# Patient Record
Sex: Female | Born: 1935 | Race: White | Hispanic: No | Smoking: Never smoker
Health system: Southern US, Community
[De-identification: ages and names within clinical notes are randomized; demographics above are authoritative.]

## PROBLEM LIST (undated history)

## (undated) DIAGNOSIS — I10 Essential (primary) hypertension: Secondary | ICD-10-CM

## (undated) DIAGNOSIS — F419 Anxiety disorder, unspecified: Secondary | ICD-10-CM

## (undated) DIAGNOSIS — B019 Varicella without complication: Secondary | ICD-10-CM

## (undated) DIAGNOSIS — R011 Cardiac murmur, unspecified: Secondary | ICD-10-CM

## (undated) DIAGNOSIS — E785 Hyperlipidemia, unspecified: Secondary | ICD-10-CM

## (undated) DIAGNOSIS — M1990 Unspecified osteoarthritis, unspecified site: Secondary | ICD-10-CM

## (undated) DIAGNOSIS — U071 COVID-19: Secondary | ICD-10-CM

## (undated) HISTORY — DX: Cardiac murmur, unspecified: R01.1

## (undated) HISTORY — DX: COVID-19: U07.1

## (undated) HISTORY — DX: Varicella without complication: B01.9

## (undated) HISTORY — DX: Hyperlipidemia, unspecified: E78.5

## (undated) HISTORY — DX: Unspecified osteoarthritis, unspecified site: M19.90

## (undated) HISTORY — DX: Anxiety disorder, unspecified: F41.9

## (undated) HISTORY — PX: FRACTURE SURGERY: SHX138

---

## 1981-02-25 HISTORY — PX: ABDOMINAL HYSTERECTOMY: SHX81

## 2016-03-18 ENCOUNTER — Ambulatory Visit (INDEPENDENT_AMBULATORY_CARE_PROVIDER_SITE_OTHER): Payer: Medicare Other | Admitting: Family Medicine

## 2016-03-18 ENCOUNTER — Encounter: Payer: Self-pay | Admitting: Family Medicine

## 2016-03-18 VITALS — BP 138/80 | HR 92 | Resp 12 | Ht 62.0 in | Wt 190.2 lb

## 2016-03-18 DIAGNOSIS — H6123 Impacted cerumen, bilateral: Secondary | ICD-10-CM

## 2016-03-18 DIAGNOSIS — R42 Dizziness and giddiness: Secondary | ICD-10-CM

## 2016-03-18 DIAGNOSIS — H9193 Unspecified hearing loss, bilateral: Secondary | ICD-10-CM | POA: Diagnosis not present

## 2016-03-18 DIAGNOSIS — Z23 Encounter for immunization: Secondary | ICD-10-CM | POA: Diagnosis not present

## 2016-03-18 MED ORDER — PREDNISONE 20 MG PO TABS: ORAL_TABLET | ORAL | 0 refills | Status: AC

## 2016-03-18 NOTE — Patient Instructions (Signed)
A few things to remember from today's visit:   Acute hearing loss, bilateral  Bilateral impacted cerumen  Dizziness and giddiness - Plan: CBC, Basic metabolic panel   Please be sure medication list is accurate. If a new problem present, please set up appointment sooner than planned today.

## 2016-03-18 NOTE — Progress Notes (Signed)
Pre visit review using our clinic review tool, if applicable. No additional management support is needed unless otherwise documented below in the visit note. 

## 2016-03-18 NOTE — Progress Notes (Signed)
HPI:   [name] is a 83 y.o. female, who is here today with her daughter's mother In low to establish care with me.  Former PCP: Has not had one since 2011. Last preventive routine visit: Several years ago.   Concerns today: hearing loss.  According to pt, this is a new problem that she noted a few days ago (1-2 weeks) and getting worse. No recent travel but around christmas she did fly.  Hearing loss is constant.  She fever,chills, headache, earache,or tinnitus.  No ear injuries. + Nasal congestion and rhinorrhea. + Intermittent dizziness, unbalance sensation, she is not sure about spinning sensation. It is exacerbated by movement, she did not note it when she was in bed. Alleviated by being still. She denies falls. Took Tylenol  Cold OTC.  No recently URI.  Denies severe/frequent headache, visual changes, chest pain, dyspnea, palpitation, claudication, focal weakness, or edema.   Review of Systems  Constitutional: Negative for activity change, appetite change, diaphoresis, fever and unexpected weight change.  HENT: Positive for congestion, hearing loss and rhinorrhea. Negative for ear discharge, ear pain, mouth sores, nosebleeds, sinus pain, sore throat, tinnitus, trouble swallowing and voice change.   Eyes: Negative for photophobia, pain and visual disturbance.  Respiratory: Negative for cough, shortness of breath and wheezing.   Cardiovascular: Negative for chest pain, palpitations and leg swelling.  Gastrointestinal: Negative for abdominal pain, nausea and vomiting.       No changes in bowel habits.  Endocrine: Negative for cold intolerance, heat intolerance, polydipsia, polyphagia and polyuria.  Genitourinary: Negative for decreased urine volume, dysuria and hematuria.  Musculoskeletal: Negative for gait problem and myalgias.  Skin: Negative for color change and rash.  Neurological: Positive for dizziness. Negative for tremors, syncope,  speech difficulty, weakness, numbness and headaches.  Hematological: Negative for adenopathy. Does not bruise/bleed easily.  Psychiatric/Behavioral: Negative for confusion and sleep disturbance. The patient is nervous/anxious.       No current outpatient prescriptions on file prior to visit.   No current facility-administered medications on file prior to visit.      Past Medical History:  Diagnosis Date  . Arthritis   . Chicken pox    Not on File  Family History  Problem Relation Age of Onset  . Diabetes Neg Hx     Social History   Social History  . Marital status: Widowed    Spouse name: N/A  . Number of children: N/A  . Years of education: N/A   Social History Main Topics  . Smoking status: Never Smoker  . Smokeless tobacco: Never Used  . Alcohol use Yes  . Drug use: No  . Sexual activity: Not Asked   Other Topics Concern  . None   Social History Narrative  . None    Vitals:   03/18/16 1512  BP: 138/80  Pulse: 92  Resp: 12   O2 sat at RA 95%  Body mass index is 34.8 kg/m.   Physical Exam  Constitutional: She is oriented to person, place, and time. She appears well-developed. She does not appear ill. No distress.  HENT:  Head: Atraumatic.  Mouth/Throat: Oropharynx is clear and moist and mucous membranes are normal.  Apley maneuver:Negative Cerumen excess bilateral.   Eyes: Conjunctivae are normal. Pupils are equal, round, and reactive to light.  Neck: No JVD present. Carotid bruit is not present. No tracheal deviation present. No thyromegaly present.  Cardiovascular: Normal rate and regular rhythm.   No  murmur heard. Pulses:      Dorsalis pedis pulses are 2+ on the right side, and 2+ on the left side.  Respiratory: Effort normal and breath sounds normal. No respiratory distress.  GI: Soft. She exhibits no mass. There is no hepatomegaly. There is no tenderness.  Musculoskeletal: She exhibits no edema or tenderness.  Lymphadenopathy:    She  has no cervical adenopathy.  Neurological: She is alert and oriented to person, place, and time. She has normal strength. No cranial nerve deficit or sensory deficit. Coordination normal.  Reflex Scores:      Patellar reflexes are 2+ on the right side and 2+ on the left side. Pronator drift negative. Stable gait, not assisted,needs assistance to get on exam table.  Skin: Skin is warm. No rash noted. No cyanosis or erythema.  Psychiatric: Her speech is normal. Her mood appears anxious. Cognition and memory are normal.  Well groomed, good eye contact.      ASSESSMENT AND PLAN:     Symphoni was seen today for establish care.  Diagnoses and all orders for this visit:  Lab Results  Component Value Date   CREATININE 0.67 03/18/2016   BUN 10 03/18/2016   NA 138 03/18/2016   K 4.0 03/18/2016   CL 104 03/18/2016   CO2 25 03/18/2016   Lab Results  Component Value Date   WBC 9.4 03/18/2016   HGB 14.2 03/18/2016   HCT 42.0 03/18/2016   MCV 88.9 03/18/2016   PLT 226.0 03/18/2016    Acute hearing loss, bilateral  We discussed possible causes: barotrauma,eustachian tube dysfunction, labyrinthitis, cerumen excess among some. Did not resolved or greatly improve after ear lavage. She agrees with starting Prednisone, some side effects discussed. Clearly instructed about warning gins. ENT referral placed.   -     Ambulatory referral to ENT -     predniSONE (DELTASONE) 20 MG tablet; 3 tabs for 3 days, 2 tabs for 3 days, 1 tabs for 3 days, and 1/2 tab for 3 days. Take tables together with breakfast.  Bilateral impacted cerumen  Bilateral. Ear lavage of left ear was successful but did not help with hearing loss. Right ear still some cerumen excess, TM not appreciated. Still hearing loss L>R  Dizziness and giddiness  ? Vertigo. Today Apley maneuver negative Other possible causes discussed. Lab work was done today, further recommendations will be given according to  results. Fall precautions discussed. For now will hold on Meclizine.  -     CBC -     Basic metabolic panel -     Ambulatory referral to ENT  Need for prophylactic vaccination and inoculation against influenza -     Flu vaccine HIGH DOSE PF    Linda G. [name], MD  [facility]. [place] office.

## 2016-03-19 ENCOUNTER — Encounter: Payer: Self-pay | Admitting: Family Medicine

## 2016-03-19 LAB — BASIC METABOLIC PANEL
BUN: 10 mg/dL (ref 6–23)
CHLORIDE: 104 meq/L (ref 96–112)
CO2: 25 mEq/L (ref 19–32)
Calcium: 10.5 mg/dL (ref 8.4–10.5)
Creatinine, Ser: 0.67 mg/dL (ref 0.40–1.20)
GFR: 89.87 mL/min (ref >60.00)
Glucose, Bld: 120 mg/dL — ABNORMAL HIGH (ref 70–99)
POTASSIUM: 4 meq/L (ref 3.5–5.1)
Sodium: 138 mEq/L (ref 135–145)

## 2016-03-19 LAB — CBC
HCT: 42 % (ref 36.0–46.0)
Hemoglobin: 14.2 g/dL (ref 12.0–15.0)
MCHC: 33.7 g/dL (ref 30.0–36.0)
MCV: 88.9 fl (ref 78.0–100.0)
PLATELETS: 226 10*3/uL (ref 150.0–400.0)
RBC: 4.73 Mil/uL (ref 3.87–5.11)
RDW: 15.7 % — AB (ref 11.5–15.5)
WBC: 9.4 10*3/uL (ref 4.0–10.5)

## 2016-03-24 ENCOUNTER — Encounter: Payer: Self-pay | Admitting: Family Medicine

## 2016-07-01 ENCOUNTER — Telehealth: Payer: Self-pay | Admitting: Family Medicine

## 2016-07-09 ENCOUNTER — Other Ambulatory Visit: Payer: Medicare Other

## 2016-07-09 NOTE — Telephone Encounter (Signed)
Called to see if pt wanted to schedule awv - home number was disconnected - no answer / no voicemail on cell.

## 2016-07-16 ENCOUNTER — Encounter: Payer: Medicare Other | Admitting: Family Medicine

## 2016-08-21 ENCOUNTER — Ambulatory Visit (INDEPENDENT_AMBULATORY_CARE_PROVIDER_SITE_OTHER): Payer: Medicare Other | Admitting: Family Medicine

## 2016-08-21 ENCOUNTER — Encounter: Payer: Self-pay | Admitting: Family Medicine

## 2016-08-21 VITALS — BP 138/80 | HR 76 | Resp 12 | Ht 62.0 in | Wt 188.2 lb

## 2016-08-21 DIAGNOSIS — E669 Obesity, unspecified: Secondary | ICD-10-CM | POA: Insufficient documentation

## 2016-08-21 DIAGNOSIS — E66811 Obesity, class 1: Secondary | ICD-10-CM | POA: Insufficient documentation

## 2016-08-21 DIAGNOSIS — Z0000 Encounter for general adult medical examination without abnormal findings: Secondary | ICD-10-CM

## 2016-08-21 DIAGNOSIS — E785 Hyperlipidemia, unspecified: Secondary | ICD-10-CM | POA: Insufficient documentation

## 2016-08-21 DIAGNOSIS — E782 Mixed hyperlipidemia: Secondary | ICD-10-CM | POA: Diagnosis not present

## 2016-08-21 DIAGNOSIS — Z131 Encounter for screening for diabetes mellitus: Secondary | ICD-10-CM

## 2016-08-21 DIAGNOSIS — F5105 Insomnia due to other mental disorder: Secondary | ICD-10-CM | POA: Diagnosis not present

## 2016-08-21 DIAGNOSIS — Z6834 Body mass index (BMI) 34.0-34.9, adult: Secondary | ICD-10-CM | POA: Diagnosis not present

## 2016-08-21 DIAGNOSIS — F419 Anxiety disorder, unspecified: Secondary | ICD-10-CM | POA: Diagnosis not present

## 2016-08-21 DIAGNOSIS — G4700 Insomnia, unspecified: Secondary | ICD-10-CM | POA: Insufficient documentation

## 2016-08-21 LAB — BASIC METABOLIC PANEL
BUN: 12 mg/dL (ref 6–23)
CHLORIDE: 104 meq/L (ref 96–112)
CO2: 27 meq/L (ref 19–32)
Calcium: 9.8 mg/dL (ref 8.4–10.5)
Creatinine, Ser: 0.56 mg/dL (ref 0.40–1.20)
GFR: 110.42 mL/min (ref >60.00)
GLUCOSE: 106 mg/dL — AB (ref 70–99)
POTASSIUM: 3.7 meq/L (ref 3.5–5.1)
Sodium: 139 mEq/L (ref 135–145)

## 2016-08-21 LAB — LIPID PANEL
CHOLESTEROL: 231 mg/dL — AB (ref 0–200)
HDL: 53.5 mg/dL (ref >39.00)
NonHDL: 177.04
Total CHOL/HDL Ratio: 4
Triglycerides: 364 mg/dL — ABNORMAL HIGH (ref 0.0–149.0)
VLDL: 72.8 mg/dL — ABNORMAL HIGH (ref 0.0–40.0)

## 2016-08-21 LAB — LDL CHOLESTEROL, DIRECT: Direct LDL: 112 mg/dL

## 2016-08-21 LAB — TSH: TSH: 1.82 u[IU]/mL (ref 0.35–4.50)

## 2016-08-21 MED ORDER — LORAZEPAM 0.5 MG PO TABS: 0.2500 mg | ORAL_TABLET | Freq: Every evening | ORAL | 0 refills | Status: DC | PRN

## 2016-08-21 NOTE — Patient Instructions (Addendum)
A few things to remember from today's visit:   Diabetes mellitus screening - Plan: Basic metabolic panel  Insomnia secondary to anxiety - Plan: TSH, LORazepam (ATIVAN) 0.5 MG tablet  Class 1 obesity without serious comorbidity with body mass index (BMI) of 34.0 to 34.9 in adult, unspecified obesity type - Plan: Lipid panel   A few tips:  -As we age balance is not as good as it was, so there is a higher risks for falls. Please remove small rugs and furniture that is "in your way" and could increase the risk of falls. Stretching exercises may help with fall prevention: Yoga and Tai Chi are some examples. Low impact exercise is better, so you are not very achy the next day.  -Sun screen and avoidance of direct sun light recommended. Caution with dehydration, if working outdoors be sure to drink enough fluids.  - Some medications are not safe as we age, increases the risk of side effects and can potentially interact with other medication you are also taken;  including some of over the counter medications. Be sure to let me know when you start a new medication even if it is a dietary/vitamin supplement.   -Healthy diet low in red meet/animal fat and sugar + regular physical activity is recommended.      Please be sure medication list is accurate. If a new problem present, please set up appointment sooner than planned today.

## 2016-08-21 NOTE — Progress Notes (Signed)
HPI:   [name] is a 83 y.o. female, who is here today with her daughter for her routine physical. She is also due for subsequent Medicare preventive visit.   Regular exercise 3 or more time per week: Not consistent. She is active with chores at home. Following a healthy diet: Yes. She lives alone but family is close.   Chronic medical problems: Hearing loss (left ear), anxiety disorder, balance "problems", OA, and obesity among some.   Mammogram: 5-6 years ago. Colonoscopy: a few years ago. DEXA: Does not recall, not interested.  Last eye exam "a while ago", she wears reading glasses.She does not feel like it is necessary.   Providers she sees regularly:   Dr Linda Glass, ENT, last OV it was recommended to follow as needed.  Functional Status Survey: Is the patient deaf or have difficulty hearing?: Yes (Left) Does the patient have difficulty seeing, even when wearing glasses/contacts?: No Does the patient have difficulty concentrating, remembering, or making decisions?: No Does the patient have difficulty walking or climbing stairs?: Yes Does the patient have difficulty dressing or bathing?: No Does the patient have difficulty doing errands alone such as visiting a doctor's office or shopping?: No  She does not drive. She has service arrangements and relatives also help with transportation if needed. She also has a private aid that helps with cleaning upstairs. She avoids going up or down stairs because knee and hip OA.   Depression screen [facility] 2/9 08/21/2016  Decreased Interest 0  Down, Depressed, Hopeless 0  PHQ - 2 Score 0       Mini-Cog - 08/21/16 0944    Normal clock drawing test? no   How many words correct? 3     Fall Risk  08/21/2016  Falls in the past year? No  Risk for fall due to : Impaired balance/gait    Hearing Screening Comments: Left hearing loss. Right hearing grossly intact. Vision Screening Comments: Not  interested.  Concerns:  Anxiety: She and her daughter are requesting refill on Lorazepam. She has Hx of anxiety and until a year or so she was taking low dose of  She started taking it after her husband's death, a few years ago. She remembers this medication helping her relax at night and sleep better.  She denies depressed mood or suicidal ideation.  Her daughter will stay with her this weekend, so she can monitor medication tolerance.  HLD:  She is on non pharmacologic treatment.   Review of Systems  Constitutional: Negative for activity change, appetite change, fatigue and fever.  HENT: Positive for hearing loss. Negative for ear pain, mouth sores, nosebleeds, sore throat and trouble swallowing.   Eyes: Negative for redness and visual disturbance.  Respiratory: Negative for cough, shortness of breath and wheezing.   Cardiovascular: Negative for chest pain, palpitations and leg swelling.  Gastrointestinal: Negative for abdominal pain, nausea and vomiting.       Negative for changes in bowel habits.  Endocrine: Negative for cold intolerance, heat intolerance, polydipsia, polyphagia and polyuria.  Genitourinary: Negative for decreased urine volume, dysuria and hematuria.  Musculoskeletal: Positive for arthralgias and gait problem.  Skin: Negative for rash.  Neurological: Negative for syncope, weakness and headaches.  Hematological: Negative for adenopathy.  Psychiatric/Behavioral: Positive for sleep disturbance. Negative for confusion, hallucinations and suicidal ideas. The patient is nervous/anxious.   All other systems reviewed and are negative.    No current outpatient prescriptions on file prior to visit.  No current facility-administered medications on file prior to visit.     Past Medical History:  Diagnosis Date  . Anxiety   . Arthritis   . Chicken pox   . Heart murmur   . Hyperlipidemia     Not on File  Family History  Problem Relation Age of Onset  .  Diabetes Neg Hx     Social History   Social History  . Marital status: Widowed    Spouse name: N/A  . Number of children: N/A  . Years of education: N/A   Social History Main Topics  . Smoking status: Never Smoker  . Smokeless tobacco: Never Used  . Alcohol use Yes     Comment: Occasional  . Drug use: No  . Sexual activity: Not Currently   Other Topics Concern  . None   Social History Narrative  . None     Vitals:   08/21/16 0901  BP: 138/80  Pulse: 76  Resp: 12   Body mass index is 34.43 kg/m.  Wt Readings from Last 3 Encounters:  08/21/16 188 lb 4 oz (85.4 kg)  03/18/16 190 lb 4 oz (86.3 kg)    Physical Exam  Nursing note and vitals reviewed. Constitutional: She is oriented to person, place, and time. She appears well-developed. No distress.  HENT:  Head: Atraumatic.  Mouth/Throat: Oropharynx is clear and moist and mucous membranes are normal.  Eyes: Conjunctivae and EOM are normal. Pupils are equal, round, and reactive to light.  Cardiovascular: Normal rate and regular rhythm.   Murmur (SEM I/VI RUSB) heard. Pulses:      Dorsalis pedis pulses are 2+ on the right side, and 2+ on the left side.  Respiratory: Effort normal and breath sounds normal. No respiratory distress.  GI: Soft. She exhibits no mass. There is no hepatomegaly. There is no tenderness.  Musculoskeletal: She exhibits no edema or tenderness.  Lymphadenopathy:    She has no cervical adenopathy.  Neurological: She is alert and oriented to person, place, and time. She has normal strength. No cranial nerve deficit (except left hear loss). Coordination normal.  Cane assisting with gait, go and get up test > 30 seconds. Oriented x 3. Clock test abnormal (numbers in correct arrangement but time was not correct)  Skin: Skin is warm. No rash noted. No erythema.  Psychiatric: Her mood appears anxious. Her affect is labile (when talking about her husband's death.). Cognition and memory are normal.   Well groomed, good eye contact.    ASSESSMENT AND PLAN:   [name] was seen today for annual exam.  Diagnoses and all orders for this visit:  Lab Results  Component Value Date   CHOL 231 (H) 08/21/2016   HDL 53.50 08/21/2016   LDLDIRECT 112.0 08/21/2016   TRIG 364.0 (H) 08/21/2016   CHOLHDL 4 08/21/2016   Lab Results  Component Value Date   CREATININE 0.56 08/21/2016   BUN 12 08/21/2016   NA 139 08/21/2016   K 3.7 08/21/2016   CL 104 08/21/2016   CO2 27 08/21/2016   Lab Results  Component Value Date   TSH 1.82 08/21/2016    Routine general medical examination at a health care facility   We discussed the importance of regular physical activity and healthy diet for prevention of chronic illness and/or complications. Preventive guidelines reviewed. Vaccination: Refused Prevnar 13. Fall prevention. She is not interested in DEXA, mammogram, or further screening. Daughter does support decision, she not think they are necessary, she think  Ms Mandt is doing fine. Ca++ and vit D supplementation recommended. Advance directives: She will bring copy of POA and living will. She has good family support. Next CPE in 1 year.   -     Basic metabolic panel  Insomnia secondary to anxiety  After discussion of side effects that include MS changes and falls, she and her daughter would like to try Lorazepam. Recommend taking 1/2 tab. Fall precautions. Daughter is staying with until next week. F/U in 3 months.  -     TSH -     LORazepam (ATIVAN) 0.5 MG tablet; Take 0.5 tablets (0.25 mg total) by mouth at bedtime as needed for anxiety.  Class 1 obesity without serious comorbidity with body mass index (BMI) of 34.0 to 34.9 in adult, unspecified obesity type  We discussed benefits of wt loss as well as adverse effects of obesity. Continue a healthy diet and if possible low impact exercise. Silver sneakers is an option, daughter does not feel like this is feasible due to  transportation issues. Weight Watchers is a good option as well as daily brisk walking for 15-30 min as tolerated.  -     Lipid panel  Diabetes mellitus screening -     Basic metabolic panel  Hyperlipidemia, mixed  Low fat diet to continue. Further recommendations will be given according to labs results.    -     Lipid panel -     LDL cholesterol, direct    Return in about 3 months (around 11/21/2016) for sleep and anxity.      [name]. [name], MD  [facility]. [place] office.

## 2016-08-24 ENCOUNTER — Encounter: Payer: Self-pay | Admitting: Family Medicine

## 2016-08-28 NOTE — Addendum Note (Signed)
Addended by: [name], [name] on: 08/28/2016 11:55 PM   Modules accepted: Level of Service

## 2016-09-01 ENCOUNTER — Encounter: Payer: Self-pay | Admitting: Family Medicine

## 2016-11-14 ENCOUNTER — Encounter: Payer: Self-pay | Admitting: Family Medicine

## 2017-01-05 NOTE — Progress Notes (Signed)
HPI:   Ms.Shalaunda Rosy Estabrook is a 83 y.o. female, who is here today with her daughter to follow on some chronic medical problems.  Last seen on 08/21/16 for her CPE.  Insomnia and anxiety, last OV Xanax 0.25 mg daily was started. Xanax was requested by patient and her daughter to help with sleep and anxiety. She had taken it in the past and did help. She denies side effects. She ran out of medication a few days ago. While she was taking Xanax she was able to fall asleep faster and stayed asleep through the night, 6 hours.  She denies suicidal thoughts or depressed mood.  Hyperlipidemia:  Last OV Simvastatin recommended but medication was not called into the pharmacy.  She is willing to try pharmacologic treatment. Following a low fat diet: Yes. She has improved her diet, has noted some wt loss.    Lab Results  Component Value Date   CHOL 231 (H) 08/21/2016   HDL 53.50 08/21/2016   LDLDIRECT 112.0 08/21/2016   TRIG 364.0 (H) 08/21/2016   CHOLHDL 4 08/21/2016    Noted sinus tachycardia, according to the patient she "always" has elevated heart rate.  According to the daughter, heart rate is usually within normal range at home.  She attributes tachycardia to anxiety. No new concerns today.  No headache, visual changes, chest pain, dyspnea, palpitation, focal weakness, or edema.  Review of Systems  Constitutional: Negative for activity change, appetite change and fatigue.  HENT: Negative for mouth sores and sore throat.   Respiratory: Negative for cough, shortness of breath and wheezing.   Cardiovascular: Negative for chest pain, palpitations and leg swelling.  Gastrointestinal: Negative for abdominal pain, nausea and vomiting.  Endocrine: Negative for cold intolerance and heat intolerance.  Musculoskeletal: Positive for gait problem. Negative for myalgias.  Neurological: Negative for dizziness, syncope, weakness and headaches.  Psychiatric/Behavioral:  Positive for sleep disturbance. Negative for confusion, hallucinations and suicidal ideas. The patient is nervous/anxious.       No current outpatient medications on file prior to visit.   No current facility-administered medications on file prior to visit.      Past Medical History:  Diagnosis Date  . Anxiety   . Arthritis   . Chicken pox   . Heart murmur   . Hyperlipidemia    Not on File  Social History   Socioeconomic History  . Marital status: Widowed    Spouse name: None  . Number of children: None  . Years of education: None  . Highest education level: None  Social Needs  . Financial resource strain: None  . Food insecurity - worry: None  . Food insecurity - inability: None  . Transportation needs - medical: None  . Transportation needs - non-medical: None  Occupational History  . None  Tobacco Use  . Smoking status: Never Smoker  . Smokeless tobacco: Never Used  Substance and Sexual Activity  . Alcohol use: Yes    Comment: Occasional  . Drug use: No  . Sexual activity: Not Currently  Other Topics Concern  . None  Social History Narrative  . None    Vitals:   01/06/17 1044  BP: 130/72  Pulse: (!) 107  Resp: 16  Temp: 97.6 F (36.4 C)  SpO2: 97%   Body mass index is 33.15 kg/m.   Physical Exam  Nursing note and vitals reviewed. Constitutional: She is oriented to person, place, and time. She appears well-developed. No distress.  HENT:  Head: Normocephalic and atraumatic.  Mouth/Throat: Oropharynx is clear and moist and mucous membranes are normal.  Eyes: Conjunctivae are normal. Pupils are equal, round, and reactive to light.  Cardiovascular: Regular rhythm. Tachycardia present.  Murmur (soft SEM RUSB) heard. Pulses:      Dorsalis pedis pulses are 2+ on the right side, and 2+ on the left side.  Respiratory: Effort normal and breath sounds normal. No respiratory distress.  GI: Soft. She exhibits no mass. There is no tenderness.    Musculoskeletal: She exhibits edema (Trace pitting LE edema,bilateral.). She exhibits no tenderness.  Lymphadenopathy:    She has no cervical adenopathy.  Neurological: She is alert and oriented to person, place, and time. She has normal strength. Coordination normal.  Stable gait assisted by cane.  Skin: Skin is warm. No erythema.  Psychiatric: Her mood appears anxious.  Well groomed, good eye contact.    ASSESSMENT AND PLAN:   Ms. Rheda was seen today for follow-up.  Diagnoses and all orders for this visit:  Insomnia secondary to anxiety  Well controlled with Xanax. Side effects of Xanax discussed. Good sleep hygiene. No changes in current management.  -     LORazepam (ATIVAN) 0.5 MG tablet; Take 0.5 tablets (0.25 mg total) at bedtime as needed by mouth for anxiety.  Hyperlipidemia, mixed  Low fat diet to continue. Zocor 20 mg to start, some side effects discussed. F/U in 3-4 months.  -     simvastatin (ZOCOR) 20 MG tablet; Take 1 tablet (20 mg total) at bedtime by mouth.  Sinus tachycardia  Mild and asymptomatic. Possible causes discussed,including anxiety among others. Instructed to continue monitoring. Adequate hydration.  Need for influenza vaccination -     Flu vaccine HIGH DOSE PF    -Ms. Brandyn Jonel Sick was advised to return sooner than planned today if new concerns arise.       Daundre Biel G. [name], MD  [facility]. [place] office.

## 2017-01-06 ENCOUNTER — Encounter: Payer: Self-pay | Admitting: Family Medicine

## 2017-01-06 ENCOUNTER — Ambulatory Visit: Payer: Medicare Other | Admitting: Family Medicine

## 2017-01-06 VITALS — BP 130/72 | HR 107 | Temp 97.6°F | Resp 16 | Ht 62.0 in | Wt 181.2 lb

## 2017-01-06 DIAGNOSIS — F5105 Insomnia due to other mental disorder: Secondary | ICD-10-CM

## 2017-01-06 DIAGNOSIS — E782 Mixed hyperlipidemia: Secondary | ICD-10-CM

## 2017-01-06 DIAGNOSIS — R000 Tachycardia, unspecified: Secondary | ICD-10-CM

## 2017-01-06 DIAGNOSIS — F419 Anxiety disorder, unspecified: Secondary | ICD-10-CM | POA: Diagnosis not present

## 2017-01-06 DIAGNOSIS — Z23 Encounter for immunization: Secondary | ICD-10-CM

## 2017-01-06 MED ORDER — SIMVASTATIN 20 MG PO TABS: 20.0000 mg | ORAL_TABLET | Freq: Every day | ORAL | 1 refills | Status: DC

## 2017-01-06 MED ORDER — LORAZEPAM 0.5 MG PO TABS: 0.2500 mg | ORAL_TABLET | Freq: Every evening | ORAL | 2 refills | Status: DC | PRN

## 2017-01-06 NOTE — Patient Instructions (Signed)
A few things to remember from today's visit:   Insomnia secondary to anxiety - Plan: LORazepam (ATIVAN) 0.5 MG tablet  Hyperlipidemia, mixed - Plan: simvastatin (ZOCOR) 20 MG tablet  Need for influenza vaccination  Sinus tachycardia  Monitor heart rate periodically.  No changes in Xanax.  Cholesterol med started and will check next visit.   Please be sure medication list is accurate. If a new problem present, please set up appointment sooner than planned today.

## 2017-08-26 ENCOUNTER — Ambulatory Visit: Payer: Medicare Other | Admitting: Family Medicine

## 2017-08-26 ENCOUNTER — Encounter: Payer: Self-pay | Admitting: Family Medicine

## 2017-08-26 VITALS — BP 130/80 | HR 101 | Temp 97.9°F | Resp 16 | Ht 62.0 in | Wt 188.4 lb

## 2017-08-26 DIAGNOSIS — F419 Anxiety disorder, unspecified: Secondary | ICD-10-CM

## 2017-08-26 DIAGNOSIS — E782 Mixed hyperlipidemia: Secondary | ICD-10-CM | POA: Diagnosis not present

## 2017-08-26 DIAGNOSIS — F5105 Insomnia due to other mental disorder: Secondary | ICD-10-CM | POA: Diagnosis not present

## 2017-08-26 LAB — COMPREHENSIVE METABOLIC PANEL
ALBUMIN: 4 g/dL (ref 3.5–5.2)
ALT: 10 U/L (ref 0–35)
AST: 15 U/L (ref 0–37)
Alkaline Phosphatase: 115 U/L (ref 39–117)
BILIRUBIN TOTAL: 0.5 mg/dL (ref 0.2–1.2)
BUN: 12 mg/dL (ref 6–23)
CALCIUM: 9.3 mg/dL (ref 8.4–10.5)
CO2: 27 mEq/L (ref 19–32)
Chloride: 103 mEq/L (ref 96–112)
Creatinine, Ser: 0.51 mg/dL (ref 0.40–1.20)
GFR: 122.69 mL/min (ref >60.00)
Glucose, Bld: 111 mg/dL — ABNORMAL HIGH (ref 70–99)
Potassium: 4 mEq/L (ref 3.5–5.1)
Sodium: 138 mEq/L (ref 135–145)
TOTAL PROTEIN: 6.4 g/dL (ref 6.0–8.3)

## 2017-08-26 LAB — LIPID PANEL
CHOLESTEROL: 257 mg/dL — AB (ref 0–200)
HDL: 65.2 mg/dL (ref >39.00)
Total CHOL/HDL Ratio: 4

## 2017-08-26 LAB — LDL CHOLESTEROL, DIRECT: Direct LDL: 120 mg/dL

## 2017-08-26 MED ORDER — CITALOPRAM HYDROBROMIDE 10 MG PO TABS: 10.0000 mg | ORAL_TABLET | Freq: Every day | ORAL | 1 refills | Status: DC

## 2017-08-26 MED ORDER — LORAZEPAM 0.5 MG PO TABS: 0.2500 mg | ORAL_TABLET | Freq: Every evening | ORAL | 3 refills | Status: DC | PRN

## 2017-08-26 NOTE — Patient Instructions (Addendum)
A few things to remember from today's visit:   Hyperlipidemia, mixed - Plan: Comprehensive metabolic panel, Lipid panel  Insomnia secondary to anxiety  Anxiety disorder, unspecified type - Plan: citalopram (CELEXA) 10 MG tablet  Celexa 10 mg added today. Monitor for side effects.  Please let me know how you do with new med. I will see you back in 12/2017 due to transportation.   Please be sure medication list is accurate. If a new problem present, please set up appointment sooner than planned today.

## 2017-08-26 NOTE — Progress Notes (Signed)
HPI:   Linda Glass is a 84 y.o. female, who is here today with her daughter for 6 months follow up.   She was last seen on 01/06/17.  Anxiety and anxiety: Currently she is on Ativan 0.5 mg, which she takes as needed, mainly at bedtime to help her sleep. She sleeps better when she takes Ativan. Sleeping about 6 to 7 hours. She feels rested next day.   She denies depressed mood or suicidal thoughts. In general she does not feel like she needs daily anxiety medication. She has not noted side effects from Ativan.  She lives alone, her daughter visits frequently.  She does not drive, usually her daughter tries to visit and on the time she has doctor's appointments.   Her daughter lives in [place].   Hyperlipidemia: Currently she is on simvastatin 20 mg daily. She is following healthy, low-fat diet. 08/21/2016 HDL 53.5 LDL 112 TC 231 TG 364. She is tolerating medication well, she is not reporting side effects.     Review of Systems  Constitutional: Negative for activity change, appetite change, fatigue and fever.  HENT: Negative for mouth sores, nosebleeds and trouble swallowing.   Respiratory: Negative for cough, shortness of breath and wheezing.   Cardiovascular: Negative for chest pain, palpitations and leg swelling.  Gastrointestinal: Negative for abdominal pain, nausea and vomiting.       Negative for changes in bowel habits.  Genitourinary: Negative for decreased urine volume and hematuria.  Musculoskeletal: Positive for gait problem. Negative for myalgias.  Neurological: Negative for syncope, weakness and headaches.  Psychiatric/Behavioral: Positive for sleep disturbance. Negative for confusion and suicidal ideas. The patient is nervous/anxious.      Current Outpatient Medications on File Prior to Visit  Medication Sig Dispense Refill  . simvastatin (ZOCOR) 20 MG tablet Take 1 tablet (20 mg total) at bedtime by mouth. 90 tablet 1   No  current facility-administered medications on file prior to visit.      Past Medical History:  Diagnosis Date  . Anxiety   . Arthritis   . Chicken pox   . Heart murmur   . Hyperlipidemia    Not on File  Social History   Socioeconomic History  . Marital status: Widowed    Spouse name: Not on file  . Number of children: Not on file  . Years of education: Not on file  . Highest education level: Not on file  Occupational History  . Not on file  Social Needs  . Financial resource strain: Not on file  . Food insecurity:    Worry: Not on file    Inability: Not on file  . Transportation needs:    Medical: Not on file    Non-medical: Not on file  Tobacco Use  . Smoking status: Never Smoker  . Smokeless tobacco: Never Used  Substance and Sexual Activity  . Alcohol use: Yes    Comment: Occasional  . Drug use: No  . Sexual activity: Not Currently  Lifestyle  . Physical activity:    Days per week: Not on file    Minutes per session: Not on file  . Stress: Not on file  Relationships  . Social connections:    Talks on phone: Not on file    Gets together: Not on file    Attends religious service: Not on file    Active member of club or organization: Not on file    Attends meetings of clubs or organizations:  Not on file    Relationship status: Not on file  Other Topics Concern  . Not on file  Social History Narrative  . Not on file    Vitals:   08/26/17 0926  BP: 130/80  Pulse: (!) 101  Resp: 16  Temp: 97.9 F (36.6 C)  SpO2: 98%   Body mass index is 34.45 kg/m.   Physical Exam  Nursing note and vitals reviewed. Constitutional: She is oriented to person, place, and time. She appears well-developed. No distress.  HENT:  Head: Normocephalic and atraumatic.  Mouth/Throat: Oropharynx is clear and moist and mucous membranes are normal.  Eyes: Pupils are equal, round, and reactive to light. Conjunctivae are normal.  Cardiovascular: Normal rate and regular  rhythm.  Murmur (soft SEM RUSB) heard. Pulses:      Dorsalis pedis pulses are 2+ on the right side, and 2+ on the left side.  Respiratory: Effort normal and breath sounds normal. No respiratory distress.  GI: Soft. She exhibits no mass. There is no tenderness.  Musculoskeletal: She exhibits edema (Trace pitting LE edema,bilateral.).  Lymphadenopathy:    She has no cervical adenopathy.  Neurological: She is alert and oriented to person, place, and time. She has normal strength. Coordination normal.  Mildly unstable gait assisted with a cane.  Skin: Skin is warm. No rash noted. No erythema.  Psychiatric: Her mood appears anxious.  Well groomed, good eye contact.       ASSESSMENT AND PLAN:   [name] was seen today for 6 months follow-up.  Orders Placed This Encounter  Procedures  . Comprehensive metabolic panel  . Lipid panel  . LDL cholesterol, direct   Lab Results  Component Value Date   ALT 10 08/26/2017   AST 15 08/26/2017   ALKPHOS 115 08/26/2017   BILITOT 0.5 08/26/2017   Lab Results  Component Value Date   CHOL 257 (H) 08/26/2017   HDL 65.20 08/26/2017   LDLDIRECT 120.0 08/26/2017   TRIG (H) 08/26/2017    401.0 Triglyceride is over 400; calculations on Lipids are invalid.   CHOLHDL 4 08/26/2017   Lab Results  Component Value Date   CREATININE 0.51 08/26/2017   BUN 12 08/26/2017   NA 138 08/26/2017   K 4.0 08/26/2017   CL 103 08/26/2017   CO2 27 08/26/2017    1. Hyperlipidemia, mixed  No changes in current management, will follow labs done today and will give further recommendations accordingly. Follow-up in 6 to 12 months depending on lipid panel results.  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 She will continue Ativan 0.5 mg daily as needed.  We discussed some side effects of medications, mainly the holidays. She her daughter voiced understanding.  - LORazepam (ATIVAN)  0.5 MG tablet; Take 0.5 tablets (0.25 mg total) by mouth at bedtime as needed for anxiety.  Dispense: 30 tablet; Refill: 3  3. Anxiety disorder, unspecified type  She still has some anxiety, noted during her visit. After discussion of some pharmacologic treatment options as well as side effects, she agrees with trying Celexa 10 mg daily. Her daughter is planning on staying with her for a week, so she will monitor for side effects. Because transportation issues she will follow-up in 4 months, instructed to let me know how she is doing with the new medication in about 6 weeks.  - citalopram (CELEXA) 10 MG tablet; Take 1 tablet (10 mg total) by mouth daily.  Dispense: 30 tablet; Refill: 1      Jairo Bellew G. [name], MD  [facility]. [place] office.

## 2017-08-31 ENCOUNTER — Encounter: Payer: Self-pay | Admitting: Family Medicine

## 2017-09-01 ENCOUNTER — Other Ambulatory Visit: Payer: Self-pay | Admitting: *Deleted

## 2017-09-01 MED ORDER — ATORVASTATIN CALCIUM 40 MG PO TABS: 40.0000 mg | ORAL_TABLET | Freq: Every day | ORAL | 3 refills | Status: DC

## 2017-11-27 ENCOUNTER — Encounter: Payer: Self-pay | Admitting: Family Medicine

## 2017-11-28 ENCOUNTER — Other Ambulatory Visit: Payer: Self-pay | Admitting: Family Medicine

## 2017-11-28 DIAGNOSIS — F419 Anxiety disorder, unspecified: Secondary | ICD-10-CM

## 2017-11-28 MED ORDER — CITALOPRAM HYDROBROMIDE 10 MG PO TABS: 10.0000 mg | ORAL_TABLET | Freq: Every day | ORAL | 1 refills | Status: DC

## 2018-03-02 ENCOUNTER — Other Ambulatory Visit: Payer: Self-pay | Admitting: Family Medicine

## 2018-03-02 DIAGNOSIS — F419 Anxiety disorder, unspecified: Secondary | ICD-10-CM

## 2018-03-02 DIAGNOSIS — F5105 Insomnia due to other mental disorder: Principal | ICD-10-CM

## 2018-03-09 ENCOUNTER — Ambulatory Visit: Payer: Medicare Other | Admitting: Family Medicine

## 2018-03-09 ENCOUNTER — Encounter: Payer: Self-pay | Admitting: Family Medicine

## 2018-03-09 VITALS — BP 168/80 | HR 100 | Temp 98.9°F | Resp 16 | Ht 62.0 in | Wt 188.6 lb

## 2018-03-09 DIAGNOSIS — Z23 Encounter for immunization: Secondary | ICD-10-CM

## 2018-03-09 DIAGNOSIS — E782 Mixed hyperlipidemia: Secondary | ICD-10-CM | POA: Diagnosis not present

## 2018-03-09 DIAGNOSIS — F419 Anxiety disorder, unspecified: Secondary | ICD-10-CM

## 2018-03-09 DIAGNOSIS — F5105 Insomnia due to other mental disorder: Secondary | ICD-10-CM

## 2018-03-09 DIAGNOSIS — R030 Elevated blood-pressure reading, without diagnosis of hypertension: Secondary | ICD-10-CM

## 2018-03-09 MED ORDER — LORAZEPAM 0.5 MG PO TABS: 0.2500 mg | ORAL_TABLET | Freq: Every evening | ORAL | 3 refills | Status: DC | PRN

## 2018-03-09 MED ORDER — CITALOPRAM HYDROBROMIDE 10 MG PO TABS: 10.0000 mg | ORAL_TABLET | Freq: Every day | ORAL | 2 refills | Status: DC

## 2018-03-09 MED ORDER — ATORVASTATIN CALCIUM 40 MG PO TABS: 40.0000 mg | ORAL_TABLET | Freq: Every day | ORAL | 2 refills | Status: DC

## 2018-03-09 NOTE — Assessment & Plan Note (Signed)
She is not fasting today. We will plan on checking lipid panel next visit. No changes in atorvastatin 40 mg daily.

## 2018-03-09 NOTE — Assessment & Plan Note (Signed)
Problem is stable. We reviewed some side effects of alprazolam. No changes in current management. Instructed about warning signs. Follow-up in 6 months, before if needed.

## 2018-03-09 NOTE — Patient Instructions (Addendum)
A few things to remember from today's visit:   Insomnia secondary to anxiety - Plan: LORazepam (ATIVAN) 0.5 MG tablet  Anxiety disorder, unspecified type - Plan: citalopram (CELEXA) 10 MG tablet  Elevated blood pressure reading  Hyperlipidemia, mixed  Monitor blood pressure at home and let me know about reading in 14 days.   Please be sure medication list is accurate. If a new problem present, please set up appointment sooner than planned today.

## 2018-03-09 NOTE — Progress Notes (Signed)
HPI:   Linda Glass is a 85 y.o. female, who is here today for 6 months follow up.   She was last seen on 08/26/2016. Today she is anxious because she is here by herself, usually she comes with her daughter. For anxiety currently she is on alprazolam 0.5 mg, which she takes at bedtime and still helps her to sleep. Last visit Celexa 10 mg was started, she feels like it helps. She denies side effects. She denies depressed mood or suicidal thoughts.    Hyperlipidemia, currently she is on Lipitor 40 mg daily. She has tolerated medication well, denies side effects. She is also following low-fat diet.  Lab Results  Component Value Date   CHOL 257 (H) 08/26/2017   HDL 65.20 08/26/2017   LDLDIRECT 120.0 08/26/2017   TRIG (H) 08/26/2017    401.0 Triglyceride is over 400; calculations on Lipids are invalid.   CHOLHDL 4 08/26/2017   Today BP elevated, no prior history of hypertension. She does not check BP regularly at home. She attributes problem to feeling anxious here today. She denies headache, chest pain, dyspnea, palpitations, or edema.   Review of Systems  Constitutional: Negative for activity change, appetite change, fatigue and fever.  HENT: Negative for mouth sores, nosebleeds and trouble swallowing.   Eyes: Negative for redness and visual disturbance.  Respiratory: Negative for shortness of breath and wheezing.   Cardiovascular: Negative for chest pain, palpitations and leg swelling.  Gastrointestinal: Negative for abdominal pain, nausea and vomiting.       Negative for changes in bowel habits.  Genitourinary: Negative for decreased urine volume and hematuria.  Musculoskeletal: Positive for gait problem. Negative for myalgias.  Neurological: Negative for syncope, weakness and headaches.  Psychiatric/Behavioral: Positive for sleep disturbance. Negative for suicidal ideas. The patient is nervous/anxious.      No current outpatient medications on  file prior to visit.   No current facility-administered medications on file prior to visit.      Past Medical History:  Diagnosis Date  . Anxiety   . Arthritis   . Chicken pox   . Heart murmur   . Hyperlipidemia    No Known Allergies  Social History   Socioeconomic History  . Marital status: Widowed    Spouse name: Not on file  . Number of children: Not on file  . Years of education: Not on file  . Highest education level: Not on file  Occupational History  . Not on file  Social Needs  . Financial resource strain: Not on file  . Food insecurity:    Worry: Not on file    Inability: Not on file  . Transportation needs:    Medical: Not on file    Non-medical: Not on file  Tobacco Use  . Smoking status: Never Smoker  . Smokeless tobacco: Never Used  Substance and Sexual Activity  . Alcohol use: Yes    Comment: Occasional  . Drug use: No  . Sexual activity: Not Currently  Lifestyle  . Physical activity:    Days per week: Not on file    Minutes per session: Not on file  . Stress: Not on file  Relationships  . Social connections:    Talks on phone: Not on file    Gets together: Not on file    Attends religious service: Not on file    Active member of club or organization: Not on file    Attends meetings of clubs or  organizations: Not on file    Relationship status: Not on file  Other Topics Concern  . Not on file  Social History Narrative  . Not on file    Vitals:   03/09/18 0828  BP: (!) 168/80  Pulse: 100  Resp: 16  Temp: 98.9 F (37.2 C)   Body mass index is 34.5 kg/m.    Physical Exam  Nursing note and vitals reviewed. Constitutional: She is oriented to person, place, and time. She appears well-developed. No distress.  HENT:  Head: Normocephalic and atraumatic.  Mouth/Throat: Oropharynx is clear and moist and mucous membranes are normal.  Eyes: Pupils are equal, round, and reactive to light. Conjunctivae are normal.  Cardiovascular: Normal  rate and regular rhythm.  No murmur heard. Pulses:      Dorsalis pedis pulses are 2+ on the right side and 2+ on the left side.  Respiratory: Effort normal and breath sounds normal. No respiratory distress.  GI: Soft. She exhibits no mass. There is no abdominal tenderness.  Musculoskeletal:        General: No edema.  Lymphadenopathy:    She has no cervical adenopathy.  Neurological: She is alert and oriented to person, place, and time. She has normal strength. No cranial nerve deficit. Gait abnormal.  Unstable gait assisted with a cane.  Skin: Skin is warm. No rash noted. No erythema.  Psychiatric: Her mood appears anxious.  Well groomed, good eye contact.     ASSESSMENT AND PLAN:   Linda Glass was seen today for 6 months follow-up.  Orders Placed This Encounter  Procedures  . Flu vaccine HIGH DOSE PF (Fluzone High dose)  . Pneumococcal polysaccharide vaccine 23-valent greater than or equal to 2yo subcutaneous/IM    Insomnia secondary to anxiety Problem is stable. We reviewed some side effects of alprazolam. No changes in current management. Instructed about warning signs. Follow-up in 6 months, before if needed.  Hyperlipidemia, mixed She is not fasting today. We will plan on checking lipid panel next visit. No changes in atorvastatin 40 mg daily.  Elevated blood pressure reading Rechecked: 165/80. Last visit BP was in normal range at 130/80. For now I recommend monitoring BP at home and let me know about readings in 1 to 2 weeks. Instructed about warning signs.  Need for immunization against influenza - Flu vaccine HIGH DOSE PF (Fluzone High dose)  Need for prophylactic vaccination against Streptococcus pneumoniae (pneumococcus) - Pneumococcal polysaccharide vaccine 23-valent greater than or equal to 2yo subcutaneous/IM   Return in about 6 months (around 09/07/2018) for F/U-CPE.      G. [name], MD  [place]. [place]  office.

## 2018-03-16 ENCOUNTER — Encounter: Payer: Self-pay | Admitting: Family Medicine

## 2018-09-08 ENCOUNTER — Encounter: Payer: Medicare Other | Admitting: Family Medicine

## 2018-11-09 ENCOUNTER — Telehealth: Payer: Self-pay | Admitting: Family Medicine

## 2018-11-09 DIAGNOSIS — F5105 Insomnia due to other mental disorder: Secondary | ICD-10-CM

## 2018-11-09 DIAGNOSIS — F419 Anxiety disorder, unspecified: Secondary | ICD-10-CM

## 2018-11-09 NOTE — Telephone Encounter (Signed)
Routing to Dr. Jordan for approval  

## 2018-11-11 ENCOUNTER — Encounter: Payer: Self-pay | Admitting: Family Medicine

## 2018-11-13 NOTE — Telephone Encounter (Signed)
Pt following up on request for her  LORazepam (ATIVAN) 0.5 MG tablet  This request was sent 11/09/18.  [place], [place] [phone] (Phone) [phone] (Fax)

## 2018-11-13 NOTE — Telephone Encounter (Signed)
Rx was sent. Please remind Ms Vandeputte that she was supposed to have a f/u visit in 08/2018. Thanks, BJ

## 2018-11-13 NOTE — Telephone Encounter (Signed)
Patient notified and stated that she can is very scared to come into the office at this time and unable to do virtual because she doesn't know how to use her iPad. Patient informed that she may be required to do a telephone visit before next refill.Patient verbalized understanding.

## 2018-12-03 ENCOUNTER — Ambulatory Visit: Payer: Medicare Other

## 2018-12-30 ENCOUNTER — Other Ambulatory Visit: Payer: Self-pay | Admitting: Family Medicine

## 2018-12-30 DIAGNOSIS — E782 Mixed hyperlipidemia: Secondary | ICD-10-CM

## 2018-12-30 DIAGNOSIS — F419 Anxiety disorder, unspecified: Secondary | ICD-10-CM

## 2018-12-30 NOTE — Telephone Encounter (Signed)
Pt due for yearly check and lab work.  She states she is too scared to come in to the office.  Requesting an extension of medication.  Please advise.

## 2019-01-04 ENCOUNTER — Other Ambulatory Visit: Payer: Self-pay | Admitting: Family Medicine

## 2019-01-04 DIAGNOSIS — F419 Anxiety disorder, unspecified: Secondary | ICD-10-CM

## 2019-01-04 DIAGNOSIS — E782 Mixed hyperlipidemia: Secondary | ICD-10-CM

## 2019-01-04 DIAGNOSIS — F5105 Insomnia due to other mental disorder: Secondary | ICD-10-CM

## 2019-01-04 MED ORDER — ATORVASTATIN CALCIUM 40 MG PO TABS: 40.0000 mg | ORAL_TABLET | Freq: Every day | ORAL | 1 refills | Status: DC

## 2019-01-04 NOTE — Telephone Encounter (Signed)
Medication: citalopram (CELEXA) 10 MG tablet [id number] , LORazepam (ATIVAN) 0.5 MG tablet [id number] , atorvastatin (LIPITOR) 40 MG tablet [id number]   Has the patient contacted their pharmacy? Yes  (Agent: If no, request that the patient contact the pharmacy for the refill.) (Agent: If yes, when and what did the pharmacy advise?)  Preferred Pharmacy (with phone number or street name): [place], [place]. [phone] (Phone) [phone] (Fax)    Agent: Please be advised that RX refills may take up to 3 business days. We ask that you follow-up with your pharmacy.

## 2019-01-04 NOTE — Telephone Encounter (Signed)
Pt is scheduled for a med refill with Dr [name]

## 2019-01-04 NOTE — Telephone Encounter (Signed)
Requested medication (s) are due for refill today: yes  Requested medication (s) are on the active medication list: yes  Last refill:  03/09/2018  Future visit scheduled: no  Notes to clinic: review for refill   Requested Prescriptions  Pending Prescriptions Disp Refills   atorvastatin (LIPITOR) 40 MG tablet 90 tablet 2    Sig: Take 1 tablet (40 mg total) by mouth daily.     Cardiovascular:  Antilipid - Statins Failed - 01/04/2019  2:13 PM      Failed - Total Cholesterol in normal range and within 360 days    Cholesterol  Date Value Ref Range Status  08/26/2017 257 (H) 0 - 200 mg/dL Final    Comment:    ATP III Classification       Desirable:  < 200 mg/dL               Borderline High:  200 - 239 mg/dL          High:  > = 240 mg/dL         Failed - LDL in normal range and within 360 days    No results found for: LDLCALC, LDLC, HIRISKLDL       Failed - HDL in normal range and within 360 days    HDL  Date Value Ref Range Status  08/26/2017 65.20 >39.00 mg/dL Final         Failed - Triglycerides in normal range and within 360 days    Triglycerides  Date Value Ref Range Status  08/26/2017 (H) 0.0 - 149.0 mg/dL Final   401.0 Triglyceride is over 400; calculations on Lipids are invalid.    Comment:    Normal:  <150 mg/dLBorderline High:  150 - 199 mg/dL         Passed - Patient is not pregnant      Passed - Valid encounter within last 12 months    Recent Outpatient Visits          10 months ago Insomnia secondary to anxiety   [name], Malka So, MD   1 year ago Hyperlipidemia, mixed   [name], Malka So, MD   1 year ago Insomnia secondary to anxiety   [name], Malka So, MD   2 years ago Routine general medical examination at a health care facility   [employer] [name], Malka So, MD   2 years ago Acute hearing loss, bilateral   [name], Malka So, MD              citalopram (CELEXA) 10 MG tablet 90 tablet 2    Sig: Take 1 tablet (10 mg total) by mouth daily.     Psychiatry:  Antidepressants - SSRI Failed - 01/04/2019  2:13 PM      Failed - Valid encounter within last 6 months    Recent Outpatient Visits          10 months ago Insomnia secondary to anxiety   [name], Malka So, MD   1 year ago Hyperlipidemia, mixed   [place] at [name], Malka So, MD   1 year ago Insomnia secondary to anxiety   [name], Malka So, MD   2 years ago Routine general medical examination at a health care facility   [name], Malka So, MD   2 years ago Acute  hearing loss, bilateral   [name], Malka So, MD             Failed - Completed PHQ-2 or PHQ-9 in the last 360 days.       LORazepam (ATIVAN) 0.5 MG tablet 30 tablet 0     Not Delegated - Psychiatry:  Anxiolytics/Hypnotics Failed - 01/04/2019  2:13 PM      Failed - This refill cannot be delegated      Failed - Urine Drug Screen completed in last 360 days.      Failed - Valid encounter within last 6 months    Recent Outpatient Visits          10 months ago Insomnia secondary to anxiety   [name], Malka So, MD   1 year ago Hyperlipidemia, mixed   [place] [name], Malka So, MD   1 year ago Insomnia secondary to anxiety   [name], Malka So, MD   2 years ago Routine general medical examination at a health care facility   [name], Malka So, MD   2 years ago Acute hearing loss, bilateral   [name], Malka So, MD

## 2019-01-04 NOTE — Telephone Encounter (Signed)
Pt is scheduled for med refill telephone visit with Dr [name] on 01/06/2019

## 2019-01-06 ENCOUNTER — Encounter: Payer: Self-pay | Admitting: Family Medicine

## 2019-01-06 ENCOUNTER — Telehealth (INDEPENDENT_AMBULATORY_CARE_PROVIDER_SITE_OTHER): Payer: Medicare Other | Admitting: Family Medicine

## 2019-01-06 DIAGNOSIS — F5105 Insomnia due to other mental disorder: Secondary | ICD-10-CM | POA: Diagnosis not present

## 2019-01-06 DIAGNOSIS — F419 Anxiety disorder, unspecified: Secondary | ICD-10-CM

## 2019-01-06 MED ORDER — LORAZEPAM 0.5 MG PO TABS: ORAL_TABLET | ORAL | 2 refills | Status: DC

## 2019-01-06 MED ORDER — CITALOPRAM HYDROBROMIDE 10 MG PO TABS: 10.0000 mg | ORAL_TABLET | Freq: Every day | ORAL | 2 refills | Status: DC

## 2019-01-06 NOTE — Progress Notes (Signed)
Virtual Visit via Telephone Note  I connected with Linda Glass on 01/06/19 at  2:00 PM EST by telephone and verified that I am speaking with the correct person using two identifiers.   I discussed the limitations, risks, security and privacy concerns of performing an evaluation and management service by telephone and the availability of in person appointments. I also discussed with the patient that there may be a patient responsible charge related to this service. The patient expressed understanding and agreed to proceed.  Location patient: home Location provider: work office Participants present for the call: patient, provider Patient did not have a visit in the prior 7 days to address this/these issue(s).   History of Present Illness: Linda Glass is a 85 yo female with history of hyperlipidemia, and anxiety who is following on anxiety and insomnia today. She states at home by herself, she communicates frequently with her daughter. She has a Chartered certified accountant that helps with chores around the house and somebody cleans her yard. She feels safe at home.  Currently she is on lorazepam 0.5 mg she takes 1/2 tablet at bedtime to help her staying asleep. She has been on same medication for years, tolerated well, denies side effects. She denies depressed mood. She goes to bed around 7 PM, she has some difficulty falling asleep, gets up around 6 AM and feels rested when she first gets up.  She is also on Celexa 10 mg daily. Medication has helped with anxiety.  She has no concerns today.   Observations/Objective: Patient sounds cheerful and well on the phone. I do not appreciate any SOB. Speech and thought processing are grossly intact. Patient reported vitals:N/A  Assessment and Plan:  1. Insomnia secondary to anxiety Problem is stable. No changes in current management. Good sleep hygiene also recommended. Follow-up in 6 months.  - LORazepam (ATIVAN) 0.5 MG tablet; TAKE  1/2 TABLET AT BEDTIME AS NEEDED FOR ANXIETY.  Dispense: 30 tablet; Refill: 2  2. Anxiety disorder, unspecified type Problem seems to be well controlled with Celexa 10 mg daily and lorazepam 0.25 mg daily. No changes in current medication. Follow-up in 6 months, before if needed. - citalopram (CELEXA) 10 MG tablet; Take 1 tablet (10 mg total) by mouth daily.  Dispense: 90 tablet; Refill: 2  Follow Up Instructions:  Return in about 6 months (around 07/06/2019) for anxiety,HLD,and medicare.  I did not refer this patient for an OV in the next 24 hours for this/these issue(s).  I discussed the assessment and treatment plan with the patient. She was provided an opportunity to ask questions and all were answered. She agreed with the plan and demonstrated an understanding of the instructions.    I provided 9 minutes of non-face-to-face time during this encounter.   Betty [name], MD

## 2019-02-17 NOTE — Progress Notes (Signed)
Patient is scheduled for 07/06/2019 at 8:30 AM

## 2019-07-02 ENCOUNTER — Encounter: Payer: Self-pay | Admitting: Family Medicine

## 2019-07-02 ENCOUNTER — Other Ambulatory Visit: Payer: Self-pay | Admitting: Family Medicine

## 2019-07-02 DIAGNOSIS — E782 Mixed hyperlipidemia: Secondary | ICD-10-CM

## 2019-07-05 ENCOUNTER — Other Ambulatory Visit: Payer: Self-pay

## 2019-07-05 ENCOUNTER — Telehealth (INDEPENDENT_AMBULATORY_CARE_PROVIDER_SITE_OTHER): Payer: Medicare Other | Admitting: Family Medicine

## 2019-07-05 ENCOUNTER — Encounter: Payer: Self-pay | Admitting: Family Medicine

## 2019-07-05 VITALS — Ht 62.0 in

## 2019-07-05 DIAGNOSIS — R030 Elevated blood-pressure reading, without diagnosis of hypertension: Secondary | ICD-10-CM

## 2019-07-05 DIAGNOSIS — F419 Anxiety disorder, unspecified: Secondary | ICD-10-CM

## 2019-07-05 DIAGNOSIS — E782 Mixed hyperlipidemia: Secondary | ICD-10-CM

## 2019-07-05 DIAGNOSIS — I10 Essential (primary) hypertension: Secondary | ICD-10-CM | POA: Insufficient documentation

## 2019-07-05 DIAGNOSIS — G4700 Insomnia, unspecified: Secondary | ICD-10-CM | POA: Diagnosis not present

## 2019-07-05 DIAGNOSIS — F411 Generalized anxiety disorder: Secondary | ICD-10-CM | POA: Insufficient documentation

## 2019-07-05 MED ORDER — LORAZEPAM 0.5 MG PO TABS: ORAL_TABLET | ORAL | 3 refills | Status: DC

## 2019-07-05 MED ORDER — CITALOPRAM HYDROBROMIDE 10 MG PO TABS: 10.0000 mg | ORAL_TABLET | Freq: Every day | ORAL | 2 refills | Status: DC

## 2019-07-05 NOTE — Assessment & Plan Note (Signed)
She did not want to check her BP today because it causes anxiety. Her BP has been elevated during OV's. Continue low salt diet. Instructed about warning signs.

## 2019-07-05 NOTE — Assessment & Plan Note (Signed)
Problem is stable. Good sleep hygiene is also recommended. Continue Lorazepam 0.5 mg 1/2 tablet daily as needed. Follow-up in 6 months.

## 2019-07-05 NOTE — Assessment & Plan Note (Signed)
She has tolerated medication well, she will continue atorvastatin 10 mg daily. Hopefully she feels more comfortable coming to the office next visit, so we can repeat blood work.

## 2019-07-05 NOTE — Assessment & Plan Note (Signed)
Problem is otherwise well controlled. Continue Celexa 10 mg daily and lorazepam 0.5 mg 1/2 to 1 tablet daily as needed.

## 2019-07-05 NOTE — Progress Notes (Signed)
Virtual Visit via Telephone Note  I connected with Linda Glass on 07/05/19 at  2:00 PM EDT by telephone and verified that I am speaking with the correct person using two identifiers.   I discussed the limitations, risks, security and privacy concerns of performing an evaluation and management service by telephone and the availability of in person appointments. I also discussed with the patient that there may be a patient responsible charge related to this service. The patient expressed understanding and agreed to proceed.  Location patient: home Location provider: work office Participants present for the call: patient, provider Patient did not have a visit in the prior 7 days to address this/these issue(s).   History of Present Illness: Linda Glass is a 86 yo female following a some chronic health problems. Last visit on 01/06/19,telephone visit. No new problems since her last OV.  Last refill for Lorazepam on 05/06/19. She takes Lorazepam 0.5 mg 1/2 tab at bedtime. It helps her sleep better. She has been on this medication for many years. Sleeps about 7 hours.  She is also on Citalopram 10 mg daily. Medications are helping with anxiety. Denies side effects.  She lives alone. Her daughter visited last during Easter. She calls her 3 times per day.   BP is usually elevated during OV's. On 03/09/18 BP was elevated at 168/80. Marland Kitchen She has a BP monitor but does not check BP because it aggravates anxiety. She denies unusual headache, vision changes, CP, dyspnea, palpitations, abdominal pain, N/V, or worsening edema. Negative for decreased urine output or gross hematuria.  HLD: She is on Atorvastatin 40 mg daily. Tolerating medication well. Negative for severe/frequent headache, visual changes, chest pain, dyspnea, palpitation, focal weakness,or Linda changes.  Lab Results  Component Value Date   CHOL 257 (H) 08/26/2017   HDL 65.20 08/26/2017   LDLDIRECT 120.0 08/26/2017    TRIG (H) 08/26/2017    401.0 Triglyceride is over 400; calculations on Lipids are invalid.   CHOLHDL 4 08/26/2017   Lab Results  Component Value Date   ALT 10 08/26/2017   AST 15 08/26/2017   ALKPHOS 115 08/26/2017   BILITOT 0.5 08/26/2017    Observations/Objective: Patient sounds cheerful and well on the phone. I do not appreciate any SOB. Speech and thought processing are grossly intact.+ Anxious. Patient reported vitals:Ht 5\' 2"  (1.575 m)   BMI 34.50 kg/m   Assessment and Plan: Elevated blood pressure reading She did not want to check her BP today because it causes anxiety. Her BP has been elevated during OV's. Continue low salt diet. Instructed about warning signs.  Insomnia disorder Problem is stable. Good sleep hygiene is also recommended. Continue Lorazepam 0.5 mg 1/2 tablet daily as needed. Follow-up in 6 months.  Hyperlipidemia, mixed She has tolerated medication well, she will continue atorvastatin 10 mg daily. Hopefully she feels more comfortable coming to the office next visit, so we can repeat blood work.  Anxiety disorder Problem is otherwise well controlled. Continue Celexa 10 mg daily and lorazepam 0.5 mg 1/2 to 1 tablet daily as needed.  She prefers not to come to the office for labs, afraid of COVID 19 exposure.  Follow Up Instructions:  Return in about 6 months (around 01/05/2020) for AWV and f/u.  I did not refer this patient for an OV in the next 24 hours for this/these issue(s).  I discussed the assessment and treatment plan with the patient. Linda Glass was provided an opportunity to ask questions and all were answered.  The patient agreed with the plan and demonstrated an understanding of the instructions.   I provided 12 minutes of non-face-to-face time during this encounter.   Annaliah Rivenbark [name], MD

## 2019-07-06 ENCOUNTER — Telehealth: Payer: Self-pay | Admitting: Family Medicine

## 2019-07-06 ENCOUNTER — Ambulatory Visit: Payer: Medicare Other | Admitting: Family Medicine

## 2019-07-06 NOTE — Telephone Encounter (Signed)
Tried to call the pt to set up 6 month follow up and AWV (around 01/05/2020) per [name] but unable to leave a message will send a my-chart message.

## 2019-08-02 ENCOUNTER — Telehealth: Payer: Self-pay | Admitting: Family Medicine

## 2019-08-02 NOTE — Telephone Encounter (Signed)
Immunization were updated on 07/05/19 by Dr. [name]. Nothing further needed.

## 2019-08-02 NOTE — Telephone Encounter (Signed)
  Pt would like the Dr to know she did receive her vaccination. She received it on  05/06/2019 and  04/ 02/[place] [facility] She received [employer] and [employer] brands. she received a notification cone saying she need vaccination

## 2019-09-01 ENCOUNTER — Telehealth: Payer: Medicare Other | Admitting: Family Medicine

## 2019-10-02 ENCOUNTER — Other Ambulatory Visit: Payer: Self-pay | Admitting: Family Medicine

## 2019-10-02 DIAGNOSIS — E782 Mixed hyperlipidemia: Secondary | ICD-10-CM

## 2020-01-05 ENCOUNTER — Ambulatory Visit: Payer: Medicare Other | Admitting: Family Medicine

## 2020-02-11 ENCOUNTER — Encounter: Payer: Self-pay | Admitting: Family Medicine

## 2020-02-11 ENCOUNTER — Ambulatory Visit (INDEPENDENT_AMBULATORY_CARE_PROVIDER_SITE_OTHER): Payer: Medicare Other | Admitting: Family Medicine

## 2020-02-11 ENCOUNTER — Other Ambulatory Visit: Payer: Self-pay

## 2020-02-11 VITALS — BP 170/90 | HR 108 | Temp 98.2°F | Resp 16 | Ht 62.0 in | Wt 180.2 lb

## 2020-02-11 DIAGNOSIS — Z0000 Encounter for general adult medical examination without abnormal findings: Secondary | ICD-10-CM | POA: Diagnosis not present

## 2020-02-11 DIAGNOSIS — I10 Essential (primary) hypertension: Secondary | ICD-10-CM

## 2020-02-11 DIAGNOSIS — F419 Anxiety disorder, unspecified: Secondary | ICD-10-CM

## 2020-02-11 DIAGNOSIS — E782 Mixed hyperlipidemia: Secondary | ICD-10-CM | POA: Diagnosis not present

## 2020-02-11 DIAGNOSIS — G4700 Insomnia, unspecified: Secondary | ICD-10-CM

## 2020-02-11 DIAGNOSIS — Z23 Encounter for immunization: Secondary | ICD-10-CM | POA: Diagnosis not present

## 2020-02-11 DIAGNOSIS — R000 Tachycardia, unspecified: Secondary | ICD-10-CM

## 2020-02-11 MED ORDER — METOPROLOL SUCCINATE ER 25 MG PO TB24: 25.0000 mg | ORAL_TABLET | Freq: Every day | ORAL | 0 refills | Status: DC

## 2020-02-11 NOTE — Assessment & Plan Note (Signed)
Continue atorvastatin 40 mg daily and low-fat diet. Further recommendation will be given according to lipid panel results. 

## 2020-02-11 NOTE — Assessment & Plan Note (Signed)
Problem is not well controlled. We discussed possible complications of elevated BP. After discussion of some side effects, she agrees with metoprolol succinate 25 mg daily.  Recommend starting with 1/2 tablet daily and increase to the whole tablet in 1 week if well-tolerated. Instructed to monitor BP regularly. She is overdue for eye exam.

## 2020-02-11 NOTE — Patient Instructions (Addendum)
  Linda Glass , Thank you for taking time to come for your Medicare Wellness Visit. I appreciate your ongoing commitment to your health goals. Please review the following plan we discussed and let me know if I can assist you in the future.   These are the goals we discussed: Goals    . Blood Pressure < 140/90     Today medication was added. Continue monitoring blood pressure at home.       This is a list of the screening recommended for you and due dates:  Health Maintenance  Topic Date Due  . Flu Shot  09/26/2019  . COVID-19 Vaccine (3 - Booster for Pfizer series) 11/26/2019  . Tetanus Vaccine  08/22/2026  . DEXA scan (bone density measurement)  Completed  . Pneumonia vaccines  Completed   A few things to remember from today's visit:   Insomnia, unspecified type  Hyperlipidemia, mixed - Plan: COMPLETE METABOLIC PANEL WITH GFR, Lipid panel  Sinus tachycardia - Plan: EKG 12-Lead, CBC, TSH, metoprolol succinate (TOPROL-XL) 25 MG 24 hr tablet  Medicare annual wellness visit, subsequent  Hypertension, essential, benign - Plan: metoprolol succinate (TOPROL-XL) 25 MG 24 hr tablet  If you need refills please call your pharmacy. Do not use My Chart to request refills or for acute issues that need immediate attention.    Please be sure medication list is accurate. If a new problem present, please set up appointment sooner than planned today.       A few tips:  -As we age balance is not as good as it was, so there is a higher risks for falls. Please remove small rugs and furniture that is "in your way" and could increase the risk of falls. Stretching exercises may help with fall prevention: Yoga and Tai Chi are some examples. Low impact exercise is better, so you are not very achy the next day.  -Sun screen and avoidance of direct sun light recommended. Caution with dehydration, if working outdoors be sure to drink enough fluids.  - Some medications are not safe as we age,  increases the risk of side effects and can potentially interact with other medication you are also taken;  including some of over the counter medications. Be sure to let me know when you start a new medication even if it is a dietary/vitamin supplement.   -Healthy diet low in red meet/animal fat and sugar + regular physical activity is recommended.

## 2020-02-11 NOTE — Assessment & Plan Note (Signed)
Problem is well controlled. Continue lorazepam 0.5 mg daily as needed and Celexa 10 mg daily. We have discussed some side effects of medications.

## 2020-02-11 NOTE — Progress Notes (Addendum)
HPI: Linda Glass is a 86 y.o. female, who is here today with her daughter for AWV and 6 months follow up.   She was last seen on 07/05/19.  She lives alone. Her daughter calls her daily and visits regularly.She is staying with her for the holidays. Independent ADL's and IADL's. No falls in the past year and denies depression symptoms. She does not exercise regularly due to OA. Follows a healthful diet, has decreased portions. She has someone helping with yard work and with house cleaning. She cooks q 3 days, meals that last for 3-4 days. She drives locally.  Functional Status Survey: Is the patient deaf or have difficulty hearing?: Yes Does the patient have difficulty seeing, even when wearing glasses/contacts?: No Does the patient have difficulty concentrating, remembering, or making decisions?: No Does the patient have difficulty walking or climbing stairs?: Yes Does the patient have difficulty dressing or bathing?: No Does the patient have difficulty doing errands alone such as visiting a doctor's office or shopping?: No  Fall Risk  02/11/2020 07/05/2019 08/21/2016  Falls in the past year? 0 0 No  Number falls in past yr: 0 0 -  Injury with Fall? 0 0 -  Risk for fall due to : Impaired balance/gait;Orthopedic patient - Impaired balance/gait  Follow up Education provided Education provided -   Providers she sees regularly: N/A Eye care provider: She has not had an eye exam in years.  Depression screen [facility] 2/9 02/11/2020  Decreased Interest 0  Down, Depressed, Hopeless 0  PHQ - 2 Score 0    Mini-Cog - 02/11/20 1019    Normal clock drawing test? no    How many words correct? 3           Hearing Screening   _0  _1  _2  _3  _4  _5  _6  _7  _8   Right ear:           Left ear:             Visual Acuity Screening   Right eye Left eye Both eyes  Without correction: _9  With correction:      Elevated BP:  Problem has been going on for a while, she usually attributed to whitecoat syndrome. Home BP readings 150-160's/80-90's. Negative for severe/frequent headache, visual changes, chest pain, dyspnea,  focal weakness, or worsening edema.  Lab Results  Component Value Date   CREATININE 0.51 08/26/2017   BUN 12 08/26/2017   NA 138 08/26/2017   K 4.0 08/26/2017   CL 103 08/26/2017   CO2 27 08/26/2017   HLD:She is on Atorvastatin 40 mg daily. Tolerating medication well.  Lab Results  Component Value Date   CHOL 257 (H) 08/26/2017   HDL 65.20 08/26/2017   LDLDIRECT 120.0 08/26/2017   TRIG (H) 08/26/2017    401.0 Triglyceride is over 400; calculations on Lipids are invalid.   CHOLHDL 4 08/26/2017   Tachycardia: Reporting hx of elevated HR. She is not sure about HR at home. She has not noted palpitations, tremor, or abnormal weight loss.  Lab Results  Component Value Date   TSH 1.82 08/21/2016   Lab Results  Component Value Date   WBC 9.4 03/18/2016   HGB 14.2 03/18/2016   HCT 42.0 03/18/2016   MCV 88.9 03/18/2016   PLT 226.0 03/18/2016   Anxiety: She is on Lorazepam 0.25 mg daily, it helps her sleep. Celexa 10 mg daily. She has tolerated medication well, no side effects. Sleeping about  8 hours. Nocturia x 1, stable for a while.  Review of Systems  Constitutional: Negative for activity change, appetite change, fatigue and fever.  HENT: Negative for mouth sores, nosebleeds and sore throat.   Respiratory: Negative for cough and wheezing.   Gastrointestinal: Negative for abdominal pain, nausea and vomiting.       Negative for changes in bowel habits.  Endocrine: Negative for cold intolerance, heat intolerance, polydipsia, polyphagia and polyuria.  Genitourinary: Negative for decreased urine volume and hematuria.  Musculoskeletal: Positive for arthralgias and gait problem.  Skin: Negative for pallor and rash.  Neurological: Negative for syncope, facial asymmetry and  weakness.  Psychiatric/Behavioral: Negative for confusion. The patient is nervous/anxious.   Rest of ROS, see pertinent positives sand negatives in HPI  Current Outpatient Medications on File Prior to Visit  Medication Sig Dispense Refill  . atorvastatin (LIPITOR) 40 MG tablet TAKE 1 TABLET EACH DAY. 90 tablet 1  . LORazepam (ATIVAN) 0.5 MG tablet TAKE 1/2 TABLET AT BEDTIME AS NEEDED FOR ANXIETY. 30 tablet 3   No current facility-administered medications on file prior to visit.   Past Medical History:  Diagnosis Date  . Anxiety   . Arthritis   . Chicken pox   . Heart murmur   . Hyperlipidemia    No Known Allergies  Social History   Socioeconomic History  . Marital status: Widowed    Spouse name: Not on file  . Number of children: Not on file  . Years of education: Not on file  . Highest education level: Not on file  Occupational History  . Not on file  Tobacco Use  . Smoking status: Never Smoker  . Smokeless tobacco: Never Used  Substance and Sexual Activity  . Alcohol use: Yes    Comment: Occasional  . Drug use: No  . Sexual activity: Not Currently  Other Topics Concern  . Not on file  Social History Narrative  . Not on file   Social Determinants of Health   Financial Resource Strain: Not on file  Food Insecurity: Not on file  Transportation Needs: Not on file  Physical Activity: Not on file  Stress: Not on file  Social Connections: Not on file    Vitals:   02/11/20 1000  BP: (!) 170/90  Pulse: (!) 108  Resp: 16  Temp: 98.2 F (36.8 C)  SpO2: 98%   Body mass index is 32.96 kg/m.  Physical Exam Vitals and nursing note reviewed.  Constitutional:      General: She is not in acute distress.    Appearance: She is well-developed.  HENT:     Head: Normocephalic and atraumatic.     Mouth/Throat:     Mouth: Oropharynx is clear and moist and mucous membranes are normal.  Eyes:     Conjunctiva/sclera: Conjunctivae normal.     Pupils: Pupils are  equal, round, and reactive to light.  Cardiovascular:     Rate and Rhythm: Regular rhythm. Tachycardia present.     Pulses:          Dorsalis pedis pulses are 2+ on the right side and 2+ on the left side.     Heart sounds: Murmur (SEM I/VI RUSB) heard.    Pulmonary:     Effort: Pulmonary effort is normal. No respiratory distress.     Breath sounds: Normal breath sounds.  Abdominal:     Palpations: Abdomen is soft. There is no hepatomegaly or mass.     Tenderness: There is no abdominal  tenderness.  Musculoskeletal:        General: No edema.  Lymphadenopathy:     Cervical: No cervical adenopathy.  Skin:    General: Skin is warm.     Findings: No erythema or rash.  Neurological:     Mental Status: She is alert and oriented to person, place, and time.     Cranial Nerves: No cranial nerve deficit.     Deep Tendon Reflexes: Strength normal.     Comments: Mildly unstable gait assisted by a cane.  Psychiatric:        Mood and Affect: Mood and affect normal.     Comments: Well groomed, good eye contact.   ASSESSMENT AND PLAN:   Linda Glass was seen today for AWV and 6 months follow-up.  Orders Placed This Encounter  Procedures  . Flu Vaccine QUAD High Dose(Fluad)  . COMPLETE METABOLIC PANEL WITH GFR  . Lipid panel  . CBC  . TSH  . EKG 12-Lead   Lab Results  Component Value Date   TSH 1.34 02/11/2020   Lab Results  Component Value Date   WBC CANCELED 02/11/2020   HGB 14.2 03/18/2016   HCT 42.0 03/18/2016   MCV 88.9 03/18/2016   PLT 226.0 03/18/2016   Lab Results  Component Value Date   CREATININE 0.53 (L) 02/11/2020   BUN 9 02/11/2020   NA 139 02/11/2020   K 3.8 02/11/2020   CL 103 02/11/2020   CO2 24 02/11/2020   Lab Results  Component Value Date   CHOL 122 02/11/2020   HDL 63 02/11/2020   LDLCALC 36 02/11/2020   LDLDIRECT 120.0 08/26/2017   TRIG 145 02/11/2020   CHOLHDL 1.9 02/11/2020   Lab Results  Component Value Date   ALT 9  02/11/2020   AST 17 02/11/2020   ALKPHOS 115 08/26/2017   BILITOT 0.9 02/11/2020   Medicare annual wellness visit, subsequent We discussed the importance of staying active, physically and mentally, as well as the benefits of a healthy/balnace diet. Low impact exercise that involve stretching and strengthing are ideal. Vaccines up to date. We discussed preventive screening for the next 5-10 years, summery of recommendations given in AVS. Fall prevention.  Advance directives and end of life discussed, she has not updated POA and has no living will. Advance health directives package given.  Sinus tachycardia Chronic and asymptomatic. She agrees with trying Metoprolol , recommend starting with 1/2 tab and increase tp whole tab if well tolerated in 7-10 days. EKG today: SR,1st AV block. No other EKG available for comparison.  We discussed some side effects of BB's. Instructed to monitor HR at home.  - metoprolol succinate (TOPROL-XL) 25 MG 24 hr tablet; Take 1 tablet (25 mg total) by mouth daily.  Dispense: 90 tablet; Refill: 0  Hypertension, essential, benign Problem is not well controlled. We discussed possible complications of elevated BP. After discussion of some side effects, she agrees with metoprolol succinate 25 mg daily.  Recommend starting with 1/2 tablet daily and increase to the whole tablet in 1 week if well-tolerated. Instructed to monitor BP regularly. She is overdue for eye exam.  Hyperlipidemia, mixed Continue atorvastatin 40 mg daily and low-fat diet. Further recommendation will be given according to lipid panel results.  Insomnia disorder Problem is stable. Good sleep hygiene. Continue lorazepam 0.5 mg 1/2 to 1 tablet at bedtime as needed.  Anxiety disorder Problem is well controlled. Continue lorazepam 0.5 mg daily as needed and Celexa 10 mg  daily. We have discussed some side effects of medications.  Need for influenza vaccination - Flu Vaccine QUAD High  Dose(Fluad)   Return in about 3 months (around 05/11/2020) for HTN, anxiety,and tach.  Payson Crumby G. [name], MD  [facility]. [place] office.  Linda Glass , Thank you for taking time to come for your Medicare Wellness Visit. I appreciate your ongoing commitment to your health goals. Please review the following plan we discussed and let me know if I can assist you in the future.   These are the goals we discussed: Goals    . Blood Pressure < 140/90     Today medication was added. Continue monitoring blood pressure at home.       This is a list of the screening recommended for you and due dates:  Health Maintenance  Topic Date Due  . Flu Shot  09/26/2019  . COVID-19 Vaccine (3 - Booster for Pfizer series) 11/26/2019  . Tetanus Vaccine  08/22/2026  . DEXA scan (bone density measurement)  Completed  . Pneumonia vaccines  Completed   A few things to remember from today's visit:   Insomnia, unspecified type  Hyperlipidemia, mixed - Plan: COMPLETE METABOLIC PANEL WITH GFR, Lipid panel  Sinus tachycardia - Plan: EKG 12-Lead, CBC, TSH, metoprolol succinate (TOPROL-XL) 25 MG 24 hr tablet  Medicare annual wellness visit, subsequent  Hypertension, essential, benign - Plan: metoprolol succinate (TOPROL-XL) 25 MG 24 hr tablet  If you need refills please call your pharmacy. Do not use My Chart to request refills or for acute issues that need immediate attention.    Please be sure medication list is accurate. If a new problem present, please set up appointment sooner than planned today.   A few tips:  -As we age balance is not as good as it was, so there is a higher risks for falls. Please remove small rugs and furniture that is "in your way" and could increase the risk of falls. Stretching exercises may help with fall prevention: Yoga and Tai Chi are some examples. Low impact exercise is better, so you are not very achy the next day.  -Sun screen and avoidance of direct  sun light recommended. Caution with dehydration, if working outdoors be sure to drink enough fluids.  - Some medications are not safe as we age, increases the risk of side effects and can potentially interact with other medication you are also taken;  including some of over the counter medications. Be sure to let me know when you start a new medication even if it is a dietary/vitamin supplement.   -Healthy diet low in red meet/animal fat and sugar + regular physical activity is recommended.

## 2020-02-11 NOTE — Assessment & Plan Note (Signed)
Problem is stable. Good sleep hygiene. Continue lorazepam 0.5 mg 1/2 to 1 tablet at bedtime as needed.

## 2020-02-12 LAB — LIPID PANEL
Cholesterol: 122 mg/dL (ref <200)
HDL: 63 mg/dL (ref >=50)
LDL Cholesterol (Calc): 36 mg/dL (calc)
Non-HDL Cholesterol (Calc): 59 mg/dL (calc) (ref <130)
Total CHOL/HDL Ratio: 1.9 (calc) (ref <5.0)
Triglycerides: 145 mg/dL (ref <150)

## 2020-02-12 LAB — TSH: TSH: 1.34 mIU/L (ref 0.40–4.50)

## 2020-02-12 LAB — COMPLETE METABOLIC PANEL WITH GFR
AG Ratio: 1.6 (calc) (ref 1.0–2.5)
ALT: 9 U/L (ref 6–29)
AST: 17 U/L (ref 10–35)
Albumin: 4.1 g/dL (ref 3.6–5.1)
Alkaline phosphatase (APISO): 103 U/L (ref 37–153)
BUN/Creatinine Ratio: 17 (calc) (ref 6–22)
BUN: 9 mg/dL (ref 7–25)
CO2: 24 mmol/L (ref 20–32)
Calcium: 9.3 mg/dL (ref 8.6–10.4)
Chloride: 103 mmol/L (ref 98–110)
Creat: 0.53 mg/dL — ABNORMAL LOW (ref 0.60–0.88)
GFR, Est African American: 101 mL/min/{1.73_m2} (ref >=60)
GFR, Est Non African American: 87 mL/min/{1.73_m2} (ref >=60)
Globulin: 2.6 g/dL (calc) (ref 1.9–3.7)
Glucose, Bld: 113 mg/dL — ABNORMAL HIGH (ref 65–99)
Potassium: 3.8 mmol/L (ref 3.5–5.3)
Sodium: 139 mmol/L (ref 135–146)
Total Bilirubin: 0.9 mg/dL (ref 0.2–1.2)
Total Protein: 6.7 g/dL (ref 6.1–8.1)

## 2020-02-12 LAB — CBC

## 2020-02-18 MED ORDER — CITALOPRAM HYDROBROMIDE 10 MG PO TABS: 10.0000 mg | ORAL_TABLET | Freq: Every day | ORAL | 3 refills | Status: DC

## 2020-02-18 MED ORDER — ATORVASTATIN CALCIUM 40 MG PO TABS: ORAL_TABLET | ORAL | 3 refills | Status: DC

## 2020-02-21 ENCOUNTER — Other Ambulatory Visit: Payer: Self-pay | Admitting: Family Medicine

## 2020-02-21 DIAGNOSIS — G4700 Insomnia, unspecified: Secondary | ICD-10-CM

## 2020-02-21 DIAGNOSIS — F419 Anxiety disorder, unspecified: Secondary | ICD-10-CM

## 2020-04-25 DIAGNOSIS — D649 Anemia, unspecified: Secondary | ICD-10-CM

## 2020-04-25 HISTORY — DX: Anemia, unspecified: D64.9

## 2020-05-01 ENCOUNTER — Encounter: Payer: Self-pay | Admitting: Family Medicine

## 2020-05-01 ENCOUNTER — Other Ambulatory Visit: Payer: Self-pay

## 2020-05-01 ENCOUNTER — Telehealth: Payer: Self-pay

## 2020-05-01 ENCOUNTER — Inpatient Hospital Stay (HOSPITAL_COMMUNITY)
Admission: EM | Admit: 2020-05-01 | Discharge: 2020-05-12 | DRG: 377 | Disposition: A | Discharge status: Skilled Nursing Facility | Payer: Medicare Other | Attending: Internal Medicine | Admitting: Internal Medicine

## 2020-05-01 ENCOUNTER — Ambulatory Visit: Payer: Medicare Other | Admitting: Family Medicine

## 2020-05-01 ENCOUNTER — Encounter (HOSPITAL_COMMUNITY): Payer: Self-pay

## 2020-05-01 VITALS — BP 130/80 | HR 90 | Resp 16 | Ht 62.0 in

## 2020-05-01 DIAGNOSIS — K921 Melena: Secondary | ICD-10-CM | POA: Diagnosis present

## 2020-05-01 DIAGNOSIS — Z79899 Other long term (current) drug therapy: Secondary | ICD-10-CM

## 2020-05-01 DIAGNOSIS — K254 Chronic or unspecified gastric ulcer with hemorrhage: Principal | ICD-10-CM | POA: Diagnosis present

## 2020-05-01 DIAGNOSIS — E785 Hyperlipidemia, unspecified: Secondary | ICD-10-CM | POA: Diagnosis present

## 2020-05-01 DIAGNOSIS — I4819 Other persistent atrial fibrillation: Secondary | ICD-10-CM | POA: Insufficient documentation

## 2020-05-01 DIAGNOSIS — Z7901 Long term (current) use of anticoagulants: Secondary | ICD-10-CM

## 2020-05-01 DIAGNOSIS — R0600 Dyspnea, unspecified: Secondary | ICD-10-CM | POA: Diagnosis not present

## 2020-05-01 DIAGNOSIS — R600 Localized edema: Secondary | ICD-10-CM

## 2020-05-01 DIAGNOSIS — F419 Anxiety disorder, unspecified: Secondary | ICD-10-CM | POA: Diagnosis not present

## 2020-05-01 DIAGNOSIS — D649 Anemia, unspecified: Secondary | ICD-10-CM | POA: Diagnosis present

## 2020-05-01 DIAGNOSIS — I10 Essential (primary) hypertension: Secondary | ICD-10-CM | POA: Diagnosis not present

## 2020-05-01 DIAGNOSIS — K6389 Other specified diseases of intestine: Secondary | ICD-10-CM | POA: Diagnosis present

## 2020-05-01 DIAGNOSIS — I4891 Unspecified atrial fibrillation: Secondary | ICD-10-CM | POA: Diagnosis not present

## 2020-05-01 DIAGNOSIS — I5033 Acute on chronic diastolic (congestive) heart failure: Secondary | ICD-10-CM | POA: Diagnosis present

## 2020-05-01 DIAGNOSIS — E8809 Other disorders of plasma-protein metabolism, not elsewhere classified: Secondary | ICD-10-CM | POA: Diagnosis not present

## 2020-05-01 DIAGNOSIS — T502X5A Adverse effect of carbonic-anhydrase inhibitors, benzothiadiazides and other diuretics, initial encounter: Secondary | ICD-10-CM | POA: Diagnosis present

## 2020-05-01 DIAGNOSIS — F411 Generalized anxiety disorder: Secondary | ICD-10-CM | POA: Diagnosis present

## 2020-05-01 DIAGNOSIS — E66811 Obesity, class 1: Secondary | ICD-10-CM | POA: Diagnosis present

## 2020-05-01 DIAGNOSIS — K449 Diaphragmatic hernia without obstruction or gangrene: Secondary | ICD-10-CM | POA: Diagnosis present

## 2020-05-01 DIAGNOSIS — D62 Acute posthemorrhagic anemia: Secondary | ICD-10-CM | POA: Diagnosis present

## 2020-05-01 DIAGNOSIS — D120 Benign neoplasm of cecum: Secondary | ICD-10-CM | POA: Diagnosis present

## 2020-05-01 DIAGNOSIS — I482 Chronic atrial fibrillation, unspecified: Secondary | ICD-10-CM | POA: Diagnosis present

## 2020-05-01 DIAGNOSIS — I2510 Atherosclerotic heart disease of native coronary artery without angina pectoris: Secondary | ICD-10-CM | POA: Diagnosis present

## 2020-05-01 DIAGNOSIS — Z6832 Body mass index (BMI) 32.0-32.9, adult: Secondary | ICD-10-CM

## 2020-05-01 DIAGNOSIS — U071 COVID-19: Secondary | ICD-10-CM | POA: Diagnosis present

## 2020-05-01 DIAGNOSIS — I480 Paroxysmal atrial fibrillation: Secondary | ICD-10-CM | POA: Diagnosis present

## 2020-05-01 DIAGNOSIS — E669 Obesity, unspecified: Secondary | ICD-10-CM | POA: Diagnosis present

## 2020-05-01 DIAGNOSIS — D61818 Other pancytopenia: Secondary | ICD-10-CM | POA: Diagnosis present

## 2020-05-01 DIAGNOSIS — K5730 Diverticulosis of large intestine without perforation or abscess without bleeding: Secondary | ICD-10-CM | POA: Diagnosis present

## 2020-05-01 DIAGNOSIS — E876 Hypokalemia: Secondary | ICD-10-CM | POA: Diagnosis present

## 2020-05-01 DIAGNOSIS — I110 Hypertensive heart disease with heart failure: Secondary | ICD-10-CM | POA: Diagnosis present

## 2020-05-01 DIAGNOSIS — R609 Edema, unspecified: Secondary | ICD-10-CM

## 2020-05-01 DIAGNOSIS — T501X5A Adverse effect of loop [high-ceiling] diuretics, initial encounter: Secondary | ICD-10-CM | POA: Diagnosis present

## 2020-05-01 DIAGNOSIS — E782 Mixed hyperlipidemia: Secondary | ICD-10-CM | POA: Diagnosis present

## 2020-05-01 DIAGNOSIS — M1990 Unspecified osteoarthritis, unspecified site: Secondary | ICD-10-CM | POA: Diagnosis present

## 2020-05-01 DIAGNOSIS — K259 Gastric ulcer, unspecified as acute or chronic, without hemorrhage or perforation: Secondary | ICD-10-CM | POA: Diagnosis present

## 2020-05-01 DIAGNOSIS — K648 Other hemorrhoids: Secondary | ICD-10-CM | POA: Diagnosis present

## 2020-05-01 DIAGNOSIS — D500 Iron deficiency anemia secondary to blood loss (chronic): Secondary | ICD-10-CM | POA: Diagnosis present

## 2020-05-01 DIAGNOSIS — R0609 Other forms of dyspnea: Secondary | ICD-10-CM

## 2020-05-01 LAB — CBC WITH DIFFERENTIAL/PLATELET
Abs Immature Granulocytes: 0.02 10*3/uL (ref 0.00–0.07)
Basophils Absolute: 0 10*3/uL (ref 0.0–0.1)
Basophils Absolute: 0 10*3/uL (ref 0.0–0.1)
Basophils Relative: 0.5 % (ref 0.0–3.0)
Basophils Relative: 0 %
Eosinophils Absolute: 0 10*3/uL (ref 0.0–0.7)
Eosinophils Absolute: 0 10*3/uL (ref 0.0–0.5)
Eosinophils Relative: 0.5 % (ref 0.0–5.0)
Eosinophils Relative: 1 %
HCT: 19.4 % — CL (ref 36.0–46.0)
HCT: 20.6 % — ABNORMAL LOW (ref 36.0–46.0)
Hemoglobin: 5.6 g/dL — CL (ref 12.0–15.0)
Hemoglobin: 5.4 g/dL — CL (ref 12.0–15.0)
Immature Granulocytes: 1 %
Lymphocytes Relative: 16.6 % (ref 12.0–46.0)
Lymphocytes Relative: 22 %
Lymphs Abs: 0.6 10*3/uL — ABNORMAL LOW (ref 0.7–4.0)
Lymphs Abs: 0.8 10*3/uL (ref 0.7–4.0)
MCH: 18.4 pg — ABNORMAL LOW (ref 26.0–34.0)
MCHC: 28.9 g/dL — ABNORMAL LOW (ref 30.0–36.0)
MCHC: 26.2 g/dL — ABNORMAL LOW (ref 30.0–36.0)
MCV: 64.3 fl — ABNORMAL LOW (ref 78.0–100.0)
MCV: 70.3 fL — ABNORMAL LOW (ref 80.0–100.0)
Monocytes Absolute: 0.4 10*3/uL (ref 0.1–1.0)
Monocytes Absolute: 0.5 10*3/uL (ref 0.1–1.0)
Monocytes Relative: 12.3 % — ABNORMAL HIGH (ref 3.0–12.0)
Monocytes Relative: 15 %
Neutro Abs: 2.4 10*3/uL (ref 1.4–7.7)
Neutro Abs: 2.3 10*3/uL (ref 1.7–7.7)
Neutrophils Relative %: 70.1 % (ref 43.0–77.0)
Neutrophils Relative %: 61 %
Platelets: 110 10*3/uL — ABNORMAL LOW (ref 150.0–400.0)
Platelets: 112 10*3/uL — ABNORMAL LOW (ref 150–400)
RBC: 3.02 Mil/uL — ABNORMAL LOW (ref 3.87–5.11)
RBC: 2.93 MIL/uL — ABNORMAL LOW (ref 3.87–5.11)
RDW: 21.7 % — ABNORMAL HIGH (ref 11.5–15.5)
RDW: 20.8 % — ABNORMAL HIGH (ref 11.5–15.5)
WBC: 3.5 10*3/uL — ABNORMAL LOW (ref 4.0–10.5)
WBC: 3.7 10*3/uL — ABNORMAL LOW (ref 4.0–10.5)
nRBC: 0.5 % — ABNORMAL HIGH (ref 0.0–0.2)

## 2020-05-01 LAB — BASIC METABOLIC PANEL
Anion gap: 11 (ref 5–15)
BUN: 10 mg/dL (ref 6–23)
BUN: 11 mg/dL (ref 8–23)
CO2: 24 mEq/L (ref 19–32)
CO2: 21 mmol/L — ABNORMAL LOW (ref 22–32)
Calcium: 8.7 mg/dL (ref 8.4–10.5)
Calcium: 8.8 mg/dL — ABNORMAL LOW (ref 8.9–10.3)
Chloride: 105 mEq/L (ref 96–112)
Chloride: 105 mmol/L (ref 98–111)
Creatinine, Ser: 0.65 mg/dL (ref 0.40–1.20)
Creatinine, Ser: 0.59 mg/dL (ref 0.44–1.00)
GFR, Estimated: >60 mL/min (ref >60)
GFR: 80.67 mL/min (ref >60.00)
Glucose, Bld: 108 mg/dL — ABNORMAL HIGH (ref 70–99)
Glucose, Bld: 105 mg/dL — ABNORMAL HIGH (ref 70–99)
Potassium: 3.4 mEq/L — ABNORMAL LOW (ref 3.5–5.1)
Potassium: 3.4 mmol/L — ABNORMAL LOW (ref 3.5–5.1)
Sodium: 139 mEq/L (ref 135–145)
Sodium: 137 mmol/L (ref 135–145)

## 2020-05-01 LAB — FOLATE: Folate: 15.8 ng/mL (ref >5.9)

## 2020-05-01 LAB — PREPARE RBC (CROSSMATCH)

## 2020-05-01 LAB — BRAIN NATRIURETIC PEPTIDE: Pro B Natriuretic peptide (BNP): 599 pg/mL — ABNORMAL HIGH (ref 0.0–100.0)

## 2020-05-01 LAB — TSH: TSH: 2.89 u[IU]/mL (ref 0.35–4.50)

## 2020-05-01 LAB — RESP PANEL BY RT-PCR (FLU A&B, COVID) ARPGX2
Influenza A by PCR: NEGATIVE
Influenza B by PCR: NEGATIVE
SARS Coronavirus 2 by RT PCR: POSITIVE — AB

## 2020-05-01 LAB — RETICULOCYTES
Immature Retic Fract: 25.4 % — ABNORMAL HIGH (ref 2.3–15.9)
RBC.: 2.89 MIL/uL — ABNORMAL LOW (ref 3.87–5.11)
Retic Count, Absolute: 63.3 10*3/uL (ref 19.0–186.0)
Retic Ct Pct: 2.2 % (ref 0.4–3.1)

## 2020-05-01 LAB — POC OCCULT BLOOD, ED: Fecal Occult Bld: NEGATIVE

## 2020-05-01 LAB — ABO/RH: ABO/RH(D): O POS

## 2020-05-01 LAB — PHOSPHORUS: Phosphorus: 3.1 mg/dL (ref 2.5–4.6)

## 2020-05-01 LAB — MAGNESIUM: Magnesium: 1.6 mg/dL — ABNORMAL LOW (ref 1.7–2.4)

## 2020-05-01 MED ORDER — FUROSEMIDE 20 MG PO TABS: 20.0000 mg | ORAL_TABLET | Freq: Every morning | ORAL | 3 refills | Status: DC

## 2020-05-01 MED ORDER — ACETAMINOPHEN 650 MG RE SUPP: 650.0000 mg | Freq: Four times a day (QID) | RECTAL | Status: DC | PRN

## 2020-05-01 MED ORDER — FUROSEMIDE 10 MG/ML IJ SOLN
40.0000 mg | Freq: Once | INTRAMUSCULAR | Status: AC
  Administered 2020-05-01: 40 mg via INTRAVENOUS
  Filled 2020-05-01: qty 4

## 2020-05-01 MED ORDER — FUROSEMIDE 20 MG PO TABS
20.0000 mg | ORAL_TABLET | Freq: Every morning | ORAL | Status: DC
  Administered 2020-05-02: 20 mg via ORAL
  Filled 2020-05-01: qty 1

## 2020-05-01 MED ORDER — MAGNESIUM SULFATE 2 GM/50ML IV SOLN
2.0000 g | Freq: Once | INTRAVENOUS | Status: AC
  Administered 2020-05-01: 2 g via INTRAVENOUS
  Filled 2020-05-01: qty 50

## 2020-05-01 MED ORDER — SODIUM CHLORIDE 0.9 % IV SOLN
10.0000 mL/h | Freq: Once | INTRAVENOUS | Status: AC
  Administered 2020-05-01: 10 mL/h via INTRAVENOUS

## 2020-05-01 MED ORDER — ACETAMINOPHEN 325 MG PO TABS
650.0000 mg | ORAL_TABLET | Freq: Four times a day (QID) | ORAL | Status: DC | PRN
  Administered 2020-05-02 – 2020-05-12 (×9): 650 mg via ORAL
  Filled 2020-05-01 (×10): qty 2

## 2020-05-01 MED ORDER — ONDANSETRON HCL 4 MG/2ML IJ SOLN: 4.0000 mg | Freq: Four times a day (QID) | INTRAMUSCULAR | Status: DC | PRN

## 2020-05-01 MED ORDER — METOPROLOL SUCCINATE ER 25 MG PO TB24
25.0000 mg | ORAL_TABLET | Freq: Every day | ORAL | Status: DC
  Administered 2020-05-01 – 2020-05-11 (×11): 25 mg via ORAL
  Filled 2020-05-01 (×11): qty 1

## 2020-05-01 MED ORDER — BOOST / RESOURCE BREEZE PO LIQD CUSTOM
1.0000 | Freq: Three times a day (TID) | ORAL | Status: DC
  Administered 2020-05-02 – 2020-05-12 (×22): 1 via ORAL

## 2020-05-01 MED ORDER — LORAZEPAM 0.5 MG PO TABS
0.2500 mg | ORAL_TABLET | Freq: Every day | ORAL | Status: DC | PRN
  Administered 2020-05-04 – 2020-05-11 (×7): 0.25 mg via ORAL
  Filled 2020-05-01 (×7): qty 1

## 2020-05-01 MED ORDER — CITALOPRAM HYDROBROMIDE 20 MG PO TABS
10.0000 mg | ORAL_TABLET | Freq: Every day | ORAL | Status: DC
  Administered 2020-05-01 – 2020-05-12 (×12): 10 mg via ORAL
  Filled 2020-05-01 (×12): qty 1

## 2020-05-01 MED ORDER — ONDANSETRON HCL 4 MG PO TABS: 4.0000 mg | ORAL_TABLET | Freq: Four times a day (QID) | ORAL | Status: DC | PRN

## 2020-05-01 MED ORDER — APIXABAN 5 MG PO TABS: 5.0000 mg | ORAL_TABLET | Freq: Two times a day (BID) | ORAL | 0 refills | Status: DC

## 2020-05-01 MED ORDER — PANTOPRAZOLE SODIUM 40 MG IV SOLR
40.0000 mg | Freq: Two times a day (BID) | INTRAVENOUS | Status: DC
  Administered 2020-05-01 – 2020-05-09 (×16): 40 mg via INTRAVENOUS
  Filled 2020-05-01 (×16): qty 40

## 2020-05-01 MED ORDER — ATORVASTATIN CALCIUM 40 MG PO TABS
40.0000 mg | ORAL_TABLET | Freq: Every day | ORAL | Status: DC
  Administered 2020-05-01 – 2020-05-12 (×12): 40 mg via ORAL
  Filled 2020-05-01 (×12): qty 1

## 2020-05-01 NOTE — Assessment & Plan Note (Signed)
Can be contributing factor to palpitations. Continue Celexa 10 mg daily and Ativan 0.5 mg daily prn.

## 2020-05-01 NOTE — Assessment & Plan Note (Addendum)
New onset. CHA2DS2VASC score is 4. We discussed Dx,prognosis, and treatment options. She agrees with starting Eliquis, we discussed side effects and benefits. Samples of Eliquis 5 mg given today. Metoprolol succinate 12.5 mg daily.Clearly instructed about warning signs. Appt with cardiologist is being arranged.

## 2020-05-01 NOTE — Patient Instructions (Addendum)
A few things to remember from today's visit:   Hypertension, essential, benign - Plan: Basic metabolic panel, EKG 13-KGMW  DOE (dyspnea on exertion) - Plan: CBC with Differential/Platelet, Basic metabolic panel, Brain Natriuretic Peptide, EKG 12-Lead  Edema of both lower extremities - Plan: Basic metabolic panel, Urinalysis, Routine w reflex microscopic, Brain Natriuretic Peptide  Anxiety disorder, unspecified type  Atrial fibrillation, unspecified type ([place]) - Plan: Ambulatory referral to Cardiology, TSH  If you need refills please call your pharmacy. Do not use My Chart to request refills or for acute issues that need immediate attention.   Metoprolol can certainly cause fatigue but I do not think it is causing leg swelling or shortness of breath. We are going to start weaning off Metoprolol, so decrease dose to 1/2 tab daily . Monitor blood pressure and heart rate at home. Eliquis started today, 5 mg 2 times daily.  No changes in rest of your medication. Please be sure medication list is accurate. If a new problem present, please set up appointment sooner than planned today.   Atrial Fibrillation  Atrial fibrillation is a type of heartbeat that is irregular or fast. If you have this condition, your heart beats without any order. This makes it hard for your heart to pump blood in a normal way. Atrial fibrillation may come and go, or it may become a long-lasting problem. If this condition is not treated, it can put you at higher risk for stroke, heart failure, and other heart problems. What are the causes? This condition may be caused by diseases that damage the heart. They include:  High blood pressure.  Heart failure.  Heart valve disease.  Heart surgery. Other causes include:  Diabetes.  Thyroid disease.  Being overweight.  Kidney disease. Sometimes the cause is not known. What increases the risk? You are more likely to develop this condition if:  You are  older.  You smoke.  You exercise often and very hard.  You have a family history of this condition.  You are a man.  You use drugs.  You drink a lot of alcohol.  You have lung conditions, such as emphysema, pneumonia, or COPD.  You have sleep apnea. What are the signs or symptoms? Common symptoms of this condition include:  A feeling that your heart is beating very fast.  Chest pain or discomfort.  Feeling short of breath.  Suddenly feeling light-headed or weak.  Getting tired easily during activity.  Fainting.  Sweating. In some cases, there are no symptoms. How is this treated? Treatment for this condition depends on underlying conditions and how you feel when you have atrial fibrillation. They include:  Medicines to: ? Prevent blood clots. ? Treat heart rate or heart rhythm problems.  Using devices, such as a pacemaker, to correct heart rhythm problems.  Doing surgery to remove the part of the heart that sends bad signals.  Closing an area where clots can form in the heart (left atrial appendage). In some cases, your doctor will treat other underlying conditions. Follow these instructions at home: Medicines  Take over-the-counter and prescription medicines only as told by your doctor.  Do not take any new medicines without first talking to your doctor.  If you are taking blood thinners: ? Talk with your doctor before you take any medicines that have aspirin or NSAIDs, such as ibuprofen, in them. ? Take your medicine exactly as told by your doctor. Take it at the same time each day. ? Avoid activities that  could hurt or bruise you. Follow instructions about how to prevent falls. ? Wear a bracelet that says you are taking blood thinners. Or, carry a card that lists what medicines you take. Lifestyle  Do not use any products that have nicotine or tobacco in them. These include cigarettes, e-cigarettes, and chewing tobacco. If you need help quitting, ask  your doctor.  Eat heart-healthy foods. Talk with your doctor about the right eating plan for you.  Exercise regularly as told by your doctor.  Do not drink alcohol.  Lose weight if you are overweight.  Do not use drugs, including cannabis.      General instructions  If you have a condition that causes breathing to stop for a short period of time (apnea), treat it as told by your doctor.  Keep a healthy weight. Do not use diet pills unless your doctor says they are safe for you. Diet pills may make heart problems worse.  Keep all follow-up visits as told by your doctor. This is important. Contact a doctor if:  You notice a change in the speed, rhythm, or strength of your heartbeat.  You are taking a blood-thinning medicine and you get more bruising.  You get tired more easily when you move or exercise.  You have a sudden change in weight. Get help right away if:  You have pain in your chest or your belly (abdomen).  You have trouble breathing.  You have side effects of blood thinners, such as blood in your vomit, poop (stool), or pee (urine), or bleeding that cannot stop.  You have any signs of a stroke. "BE FAST" is an easy way to remember the main warning signs: ? B - Balance. Signs are dizziness, sudden trouble walking, or loss of balance. ? E - Eyes. Signs are trouble seeing or a change in how you see. ? F - Face. Signs are sudden weakness or loss of feeling in the face, or the face or eyelid drooping on one side. ? A - Arms. Signs are weakness or loss of feeling in an arm. This happens suddenly and usually on one side of the body. ? S - Speech. Signs are sudden trouble speaking, slurred speech, or trouble understanding what people say. ? T - Time. Time to call emergency services. Write down what time symptoms started.  You have other signs of a stroke, such as: ? A sudden, very bad headache with no known cause. ? Feeling like you may vomit  (nausea). ? Vomiting. ? A seizure. These symptoms may be an emergency. Do not wait to see if the symptoms will go away. Get medical help right away. Call your local emergency services (911 in the [place] Do not drive yourself to the hospital.   Summary  Atrial fibrillation is a type of heartbeat that is irregular or fast.  You are at higher risk of this condition if you smoke, are older, have diabetes, or are overweight.  Follow your doctor's instructions about medicines, diet, exercise, and follow-up visits.  Get help right away if you have signs or symptoms of a stroke.  Get help right away if you cannot catch your breath, or you have chest pain or discomfort. This information is not intended to replace advice given to you by your health care provider. Make sure you discuss any questions you have with your health care provider. Document Revised: 08/05/2018 Document Reviewed: 08/05/2018 Elsevier Patient Education  [place].

## 2020-05-01 NOTE — Telephone Encounter (Signed)
CRITICAL VALUE STICKER  CRITICAL VALUE: Hemoglobin : 5.5 Hematocrit: 19.5  RECEIVER (on-site recipient of call): Judson Roch, CMA  DATE & TIME NOTIFIED: 410 PM 05/01/20   MESSENGER (representative from lab): Kenney Houseman  MD NOTIFIED: Yes  TIME OF NOTIFICATION: 4:15 PM  RESPONSE: MD wants pt to go to the hospital. Called pt and went over results with her. Advised PCP wants pt to go to the ED now. Pt verbalized understanding and will go now. Pt also advised not to take the Eliquis.

## 2020-05-01 NOTE — ED Triage Notes (Signed)
Pt sent from PCP for low hemoglobin (drawn today), states it was 5. Denies hx of anemia. States she sometimes has dark stool and feeling weak. Pt appears pale

## 2020-05-01 NOTE — ED Provider Notes (Signed)
[facility] DEPT Provider Note   CSN: [id number] Arrival date & time: 05/01/20  1721     History Chief Complaint  Patient presents with  . abnormal labs    Linda Glass is a 87 y.o. female.  87 year old female with prior medical history as detailed below presents for evaluation.  Patient was seen at her primary care clinic earlier today.  Patient was diagnosed with significant symptomatic anemia at that time.  She was sent to the ED for admission for blood transfusion and additional work-up.  Patient reports feeling weak and fatigued.  She reports gradual onset of symptoms over the last several weeks.  She does report intermittent dark stools.  She denies recent dark stools or gross bleeding.  She denies associated chest pain or shortness of breath.  She reports increased fatigue especially with exertion.  She denies prior history of anemia.  She denies prior history of blood transfusion.  The history is provided by the patient, medical records and the EMS personnel.  Illness Location:  Anemia  Severity:  Moderate Onset quality:  Gradual Duration:  3 weeks Timing:  Constant Progression:  Worsening Chronicity:  New Associated symptoms: no fever        Past Medical History:  Diagnosis Date  . Anxiety   . Arthritis   . Chicken pox   . Heart murmur   . Hyperlipidemia     Patient Active Problem List   Diagnosis Date Noted  . Atrial fibrillation ([place]) 05/01/2020  . Hypertension, essential, benign 07/05/2019  . Anxiety disorder 07/05/2019  . Insomnia disorder 08/21/2016  . Class 1 obesity without serious comorbidity with body mass index (BMI) of 34.0 to 34.9 in adult 08/21/2016  . Hyperlipidemia, mixed 08/21/2016    Past Surgical History:  Procedure Laterality Date  . ABDOMINAL HYSTERECTOMY  1983  . FRACTURE SURGERY     wrist fracture 2013     OB History   No obstetric history on file.     Family History  Problem  Relation Age of Onset  . Diabetes Neg Hx     Social History   Tobacco Use  . Smoking status: Never Smoker  . Smokeless tobacco: Never Used  Substance Use Topics  . Alcohol use: Yes    Comment: Occasional  . Drug use: No    Home Medications Prior to Admission medications   Medication Sig Start Date End Date Taking? Authorizing Provider  apixaban (ELIQUIS) 5 MG TABS tablet Take 1 tablet (5 mg total) by mouth 2 (two) times daily. 05/01/20 05/31/20  [name], [name], MD  atorvastatin (LIPITOR) 40 MG tablet TAKE 1 TABLET EACH DAY. 02/18/20   [name], [name], MD  citalopram (CELEXA) 10 MG tablet Take 1 tablet (10 mg total) by mouth daily. 02/18/20   [name], [name], MD  furosemide (LASIX) 20 MG tablet Take 1 tablet (20 mg total) by mouth in the morning. 05/01/20   [name], [name], MD  LORazepam (ATIVAN) 0.5 MG tablet TAKE 1/2 TABLET AT BEDTIME AS NEEDED FOR ANXIETY. 02/22/20   [name], [name], MD  metoprolol succinate (TOPROL-XL) 25 MG 24 hr tablet Take 1 tablet (25 mg total) by mouth daily. 02/11/20   [name], [name], MD    Allergies    Patient has no known allergies.  Review of Systems   Review of Systems  Constitutional: Negative for fever.  All other systems reviewed and are negative.   Physical Exam Updated Vital Signs BP (!) 163/90 (  BP Location: Left Arm)   Pulse 76   Temp (!) 97.5 F (36.4 C) (Oral)   Resp (!) 21   Ht 5\' 2"  (1.575 m)   Wt 79.4 kg   SpO2 97%   BMI 32.01 kg/m   Physical Exam Vitals and nursing note reviewed.  Constitutional:      General: She is not in acute distress.    Appearance: Normal appearance. She is well-developed and well-nourished.     Comments: Pale  HENT:     Head: Normocephalic and atraumatic.     Mouth/Throat:     Mouth: Oropharynx is clear and moist.  Eyes:     Extraocular Movements: EOM normal.     Pupils: Pupils are equal, round, and reactive to light.     Comments: Pale conjunctivae   Cardiovascular:     Rate and Rhythm:  Normal rate and regular rhythm.     Heart sounds: Normal heart sounds.  Pulmonary:     Effort: Pulmonary effort is normal. No respiratory distress.     Breath sounds: Normal breath sounds.  Abdominal:     General: There is no distension.     Palpations: Abdomen is soft.     Tenderness: There is no abdominal tenderness.  Genitourinary:    Rectum: Guaiac result negative.     Comments: No melena  Musculoskeletal:        General: No deformity or edema. Normal range of motion.     Cervical back: Normal range of motion and neck supple.  Skin:    General: Skin is warm and dry.  Neurological:     Mental Status: She is alert and oriented to person, place, and time.  Psychiatric:        Mood and Affect: Mood and affect normal.     ED Results / Procedures / Treatments   Labs (all labs ordered are listed, but only abnormal results are displayed) Labs Reviewed  CBC WITH DIFFERENTIAL/PLATELET - Abnormal; Notable for the following components:      Result Value   WBC 3.7 (*)    RBC 2.93 (*)    Hemoglobin 5.4 (*)    HCT 20.6 (*)    MCV 70.3 (*)    MCH 18.4 (*)    MCHC 26.2 (*)    RDW 20.8 (*)    Platelets 112 (*)    nRBC 0.5 (*)    All other components within normal limits  BASIC METABOLIC PANEL - Abnormal; Notable for the following components:   Potassium 3.4 (*)    CO2 21 (*)    Glucose, Bld 105 (*)    Calcium 8.8 (*)    All other components within normal limits  RESP PANEL BY RT-PCR (FLU A&B, COVID) ARPGX2  POC OCCULT BLOOD, ED  TYPE AND SCREEN  PREPARE RBC (CROSSMATCH)    EKG EKG Interpretation  Date/Time:  Monday May 01 2020 18:51:42 EST Ventricular Rate:  74 PR Interval:    QRS Duration: 112 QT Interval:  422 QTC Calculation: 469 R Axis:   20 Text Interpretation: Atrial fibrillation Borderline intraventricular conduction delay Low voltage, precordial leads Confirmed by Dene Gentry [phone]) on 05/01/2020 7:03:20 PM   Radiology No results  found.  Procedures Procedures   Medications Ordered in ED Medications  0.9 %  sodium chloride infusion (has no administration in time range)    ED Course  I have reviewed the triage vital signs and the nursing notes.  Pertinent labs & imaging results that were  available during my care of the patient were reviewed by me and considered in my medical decision making (see chart for details).    MDM Rules/Calculators/A&P                          MDM  Screen complete   Linda Glass was evaluated in Emergency Department on 05/01/2020 for the symptoms described in the history of present illness. She was evaluated in the context of the global COVID-19 pandemic, which necessitated consideration that the patient might be at risk for infection with the SARS-CoV-2 virus that causes COVID-19. Institutional protocols and algorithms that pertain to the evaluation of patients at risk for COVID-19 are in a state of rapid change based on information released by regulatory bodies including the CDC and federal and state organizations. These policies and algorithms were followed during the patient's care in the ED.  Patient is presenting for evaluation and treatment of newly diagnosed symptomatic anemia.  Patient without reported acute bleeding.  Patient does appear to be symptomatic with increased fatigue with exertion.  Patient would benefit from transfusion and admission for further work-up and treatment.  Hospitalist service is aware of case and will evaluate for admission.   Final Clinical Impression(s) / ED Diagnoses Final diagnoses:  Anemia, unspecified type    Rx / DC Orders ED Discharge Orders    None       Valarie Merino, MD 05/01/20 [phone]

## 2020-05-01 NOTE — Progress Notes (Signed)
Chief Complaint  Patient presents with  . Medication Reaction   HPI: Linda Glass is a 87 y.o. female with history of hypertension, anxiety, and hyperlipidemia here today with her son-in-law complaining of fatigue and SOB. States that she has noted fatigue since metoprolol was started, 02/11/2020.  Lower extremities edema for about a week.Problem is worse at the end of the day and alleviated with LE elevation, much better in the morning when she gets up. SOB and fatigue are exacerbated by exertion and alleviated by rest. Denies orthopnea and PND. Negative for associated fever, night sweats, CP, decreased urine output, gross hematuria, foam in urine, or lower extremity pain/erythema.  She has had some palpitations , which she attributes to anxiety.  Lab Results  Component Value Date   TSH 1.34 02/11/2020   Metoprolol succinate 25 mg was started last visit to treat hypertension and sinus tachycardia. She is not checking BP or HR, doing so exacerbates anxiety.  She has not noted unusual headache or focal weakness. Occasionally she has some blurry vision, also attributed to anxiety.  States that sometimes she has abdominal pain, mild, in the morning, and alleviated by defecation. She thinks this is caused by statin medication, atorvastatin, which she has been taking for about a year. Negative for nausea,vomiting,or changes in bowel habits.  Lab Results  Component Value Date   CREATININE 0.53 (L) 02/11/2020   BUN 9 02/11/2020   NA 139 02/11/2020   K 3.8 02/11/2020   CL 103 02/11/2020   CO2 24 02/11/2020   Anxiety: She reporting problem has a stable. She is on lorazepam 0.5 mg daily, which she usually takes at night to help her sleep. She is also on citalopram 10 mg daily.  Review of Systems  Constitutional: Positive for activity change. Negative for appetite change and fever.  HENT: Negative for mouth sores, nosebleeds and sore throat.   Genitourinary:  Negative for dysuria and frequency.  Musculoskeletal: Positive for arthralgias and gait problem.  Skin: Negative for rash and wound.  Neurological: Negative for syncope, facial asymmetry and weakness.  Psychiatric/Behavioral: Negative for confusion. The patient is nervous/anxious.   Rest see pertinent positives and negatives per HPI.  Current Outpatient Medications on File Prior to Visit  Medication Sig Dispense Refill  . atorvastatin (LIPITOR) 40 MG tablet TAKE 1 TABLET EACH DAY. 90 tablet 3  . citalopram (CELEXA) 10 MG tablet Take 1 tablet (10 mg total) by mouth daily. 90 tablet 3  . LORazepam (ATIVAN) 0.5 MG tablet TAKE 1/2 TABLET AT BEDTIME AS NEEDED FOR ANXIETY. 30 tablet 3  . metoprolol succinate (TOPROL-XL) 25 MG 24 hr tablet Take 1 tablet (25 mg total) by mouth daily. 90 tablet 0   No current facility-administered medications on file prior to visit.   Past Medical History:  Diagnosis Date  . Anxiety   . Arthritis   . Chicken pox   . Heart murmur   . Hyperlipidemia    No Known Allergies  Social History   Socioeconomic History  . Marital status: Widowed    Spouse name: Not on file  . Number of children: Not on file  . Years of education: Not on file  . Highest education level: Not on file  Occupational History  . Not on file  Tobacco Use  . Smoking status: Never Smoker  . Smokeless tobacco: Never Used  Substance and Sexual Activity  . Alcohol use: Yes    Comment: Occasional  . Drug use: No  .  Sexual activity: Not Currently  Other Topics Concern  . Not on file  Social History Narrative  . Not on file   Social Determinants of Health   Financial Resource Strain: Not on file  Food Insecurity: Not on file  Transportation Needs: Not on file  Physical Activity: Not on file  Stress: Not on file  Social Connections: Not on file   Vitals:   05/01/20 1346  BP: 130/80  Pulse: 90  Resp: 16  SpO2: 95%   Body mass index is 32.96 kg/m.  Physical Exam Vitals  and nursing note reviewed.  Constitutional:      General: She is not in acute distress.    Appearance: She is well-developed.  HENT:     Head: Normocephalic and atraumatic.     Mouth/Throat:     Mouth: Oropharynx is clear and moist and mucous membranes are normal.  Eyes:     Conjunctiva/sclera: Conjunctivae normal.  Cardiovascular:     Rate and Rhythm: Normal rate. Rhythm irregular. Occasional extrasystoles are present.    Heart sounds: Murmur (SEM I/VI RUSB) heard.      Comments: No LE erythema or tenderness. DP pulse present,bilateral. Pulmonary:     Effort: Pulmonary effort is normal. No respiratory distress.     Breath sounds: Normal breath sounds.  Abdominal:     Palpations: Abdomen is soft. There is no mass.     Tenderness: There is no abdominal tenderness.  Musculoskeletal:     Right lower leg: 3+ Pitting Edema present.     Left lower leg: 3+ Pitting Edema present.  Lymphadenopathy:     Cervical: No cervical adenopathy.  Skin:    General: Skin is warm.     Findings: No erythema or rash.  Neurological:     Mental Status: She is alert and oriented to person, place, and time.     Cranial Nerves: No cranial nerve deficit.     Gait: Gait normal.     Deep Tendon Reflexes: Strength normal.     Comments: She is in a wheel chair today.  Psychiatric:        Mood and Affect: Mood is anxious. Mood is not depressed.     Comments: Well groomed, good eye contact.   ASSESSMENT AND PLAN:  [name] was seen today for medication reaction.  Diagnoses and all orders for this visit: Orders Placed This Encounter  Procedures  . CBC with Differential/Platelet  . Basic metabolic panel  . Urinalysis, Routine w reflex microscopic  . Brain Natriuretic Peptide  . TSH  . Ambulatory referral to Cardiology  . EKG 12-Lead   Lab Results  Component Value Date   WBC 3.5 (L) 05/01/2020   HGB 5.6 Repeated and verified X2. (LL) 05/01/2020   HCT 19.4 Repeated and verified X2. (LL)  05/01/2020   MCV 64.3 (L) 05/01/2020   PLT 110.0 (L) 05/01/2020   Lab Results  Component Value Date   CREATININE 0.65 05/01/2020   BUN 10 05/01/2020   NA 139 05/01/2020   K 3.4 (L) 05/01/2020   CL 105 05/01/2020   CO2 24 05/01/2020   Lab Results  Component Value Date   TSH 2.89 05/01/2020   DOE (dyspnea on exertion) We discussed possible etiologies. EKG today showed atrial fib, which could be contributing to symptomatology. Lungs are clear and she is not reporting cough or wheezing, so we can hold on CXR. Clearly instructed about warning signs.  Edema of both lower extremities Furosemide will be recommended, side  to be adjusted according to BMP and BNP. LE elevation and compression stocking will help. Further recommendations according to lab results.  -     furosemide (LASIX) 20 MG tablet; Take 1 tablet (20 mg total) by mouth in the morning.  Hypertension, essential, benign BP is adequately controlled. Metoprolol can certainly contribute to being fatigue, so we will decrease dose. Metoprolol succinate decreased from 25 mg to 12.5 mg daily. Recommend monitoring BP and HR at home. Continue low salt diet.  Anxiety disorder Can be contributing factor to palpitations. Continue Celexa 10 mg daily and Ativan 0.5 mg daily prn.   Atrial fibrillation ([place]) New onset. CHA2DS2VASC score is 4. We discussed Dx,prognosis, and treatment options. She agrees with starting Eliquis, we discussed side effects and benefits. Samples of Eliquis 5 mg given today. Metoprolol succinate 12.5 mg daily.Clearly instructed about warning signs. Appt with cardiologist is being arranged.   Addendum:Critical lab, severe anemia. Pt was contacted and instructed to go to the ER now. She has not taken Eliquis, instructed to hold on it. Her son-in-law is going to take her to the ER now. See telephone encounter.   Return in about 4 weeks (around 05/29/2020) for 4 months if cardio can see her in a few  weeks..   [name]. [name], MD  [facility]. [place] office.   A few things to remember from today's visit:   Hypertension, essential, benign - Plan: Basic metabolic panel, EKG 75-ZWCH  DOE (dyspnea on exertion) - Plan: CBC with Differential/Platelet, Basic metabolic panel, Brain Natriuretic Peptide, EKG 12-Lead  Edema of both lower extremities - Plan: Basic metabolic panel, Urinalysis, Routine w reflex microscopic, Brain Natriuretic Peptide  Anxiety disorder, unspecified type  Atrial fibrillation, unspecified type ([place]) - Plan: Ambulatory referral to Cardiology, TSH  If you need refills please call your pharmacy. Do not use My Chart to request refills or for acute issues that need immediate attention.   Metoprolol can certainly cause fatigue but I do not think it is causing leg swelling or shortness of breath. We are going to start weaning off Metoprolol, so decrease dose to 1/2 tab daily . Monitor blood pressure and heart rate at home. Eliquis started today, 5 mg 2 times daily.  No changes in rest of your medication. Please be sure medication list is accurate. If a new problem present, please set up appointment sooner than planned today.   Atrial Fibrillation  Atrial fibrillation is a type of heartbeat that is irregular or fast. If you have this condition, your heart beats without any order. This makes it hard for your heart to pump blood in a normal way. Atrial fibrillation may come and go, or it may become a long-lasting problem. If this condition is not treated, it can put you at higher risk for stroke, heart failure, and other heart problems. What are the causes? This condition may be caused by diseases that damage the heart. They include:  High blood pressure.  Heart failure.  Heart valve disease.  Heart surgery. Other causes include:  Diabetes.  Thyroid disease.  Being overweight.  Kidney disease. Sometimes the cause is not known. What  increases the risk? You are more likely to develop this condition if:  You are older.  You smoke.  You exercise often and very hard.  You have a family history of this condition.  You are a man.  You use drugs.  You drink a lot of alcohol.  You have lung conditions, such as emphysema,  pneumonia, or COPD.  You have sleep apnea. What are the signs or symptoms? Common symptoms of this condition include:  A feeling that your heart is beating very fast.  Chest pain or discomfort.  Feeling short of breath.  Suddenly feeling light-headed or weak.  Getting tired easily during activity.  Fainting.  Sweating. In some cases, there are no symptoms. How is this treated? Treatment for this condition depends on underlying conditions and how you feel when you have atrial fibrillation. They include:  Medicines to: ? Prevent blood clots. ? Treat heart rate or heart rhythm problems.  Using devices, such as a pacemaker, to correct heart rhythm problems.  Doing surgery to remove the part of the heart that sends bad signals.  Closing an area where clots can form in the heart (left atrial appendage). In some cases, your doctor will treat other underlying conditions. Follow these instructions at home: Medicines  Take over-the-counter and prescription medicines only as told by your doctor.  Do not take any new medicines without first talking to your doctor.  If you are taking blood thinners: ? Talk with your doctor before you take any medicines that have aspirin or NSAIDs, such as ibuprofen, in them. ? Take your medicine exactly as told by your doctor. Take it at the same time each day. ? Avoid activities that could hurt or bruise you. Follow instructions about how to prevent falls. ? Wear a bracelet that says you are taking blood thinners. Or, carry a card that lists what medicines you take. Lifestyle  Do not use any products that have nicotine or tobacco in them. These  include cigarettes, e-cigarettes, and chewing tobacco. If you need help quitting, ask your doctor.  Eat heart-healthy foods. Talk with your doctor about the right eating plan for you.  Exercise regularly as told by your doctor.  Do not drink alcohol.  Lose weight if you are overweight.  Do not use drugs, including cannabis.      General instructions  If you have a condition that causes breathing to stop for a short period of time (apnea), treat it as told by your doctor.  Keep a healthy weight. Do not use diet pills unless your doctor says they are safe for you. Diet pills may make heart problems worse.  Keep all follow-up visits as told by your doctor. This is important. Contact a doctor if:  You notice a change in the speed, rhythm, or strength of your heartbeat.  You are taking a blood-thinning medicine and you get more bruising.  You get tired more easily when you move or exercise.  You have a sudden change in weight. Get help right away if:  You have pain in your chest or your belly (abdomen).  You have trouble breathing.  You have side effects of blood thinners, such as blood in your vomit, poop (stool), or pee (urine), or bleeding that cannot stop.  You have any signs of a stroke. "BE FAST" is an easy way to remember the main warning signs: ? B - Balance. Signs are dizziness, sudden trouble walking, or loss of balance. ? E - Eyes. Signs are trouble seeing or a change in how you see. ? F - Face. Signs are sudden weakness or loss of feeling in the face, or the face or eyelid drooping on one side. ? A - Arms. Signs are weakness or loss of feeling in an arm. This happens suddenly and usually on one side of the body. ?  S - Speech. Signs are sudden trouble speaking, slurred speech, or trouble understanding what people say. ? T - Time. Time to call emergency services. Write down what time symptoms started.  You have other signs of a stroke, such as: ? A sudden, very bad  headache with no known cause. ? Feeling like you may vomit (nausea). ? Vomiting. ? A seizure. These symptoms may be an emergency. Do not wait to see if the symptoms will go away. Get medical help right away. Call your local emergency services (911 in the [place] Do not drive yourself to the hospital.   Summary  Atrial fibrillation is a type of heartbeat that is irregular or fast.  You are at higher risk of this condition if you smoke, are older, have diabetes, or are overweight.  Follow your doctor's instructions about medicines, diet, exercise, and follow-up visits.  Get help right away if you have signs or symptoms of a stroke.  Get help right away if you cannot catch your breath, or you have chest pain or discomfort. This information is not intended to replace advice given to you by your health care provider. Make sure you discuss any questions you have with your health care provider. Document Revised: 08/05/2018 Document Reviewed: 08/05/2018 Elsevier Patient Education  [place].

## 2020-05-01 NOTE — Assessment & Plan Note (Addendum)
BP is adequately controlled. Metoprolol can certainly contribute to being fatigue, so we will decrease dose. Metoprolol succinate decreased from 25 mg to 12.5 mg daily. Recommend monitoring BP and HR at home. Continue low salt diet.

## 2020-05-01 NOTE — H&P (Signed)
History and Physical    Linda Glass YBO:[id number] DOB: 1933/08/22 DOA: 05/01/2020  PCP: [name], [name], MD   Patient coming from: Home.  I have personally briefly reviewed patient's old medical records in [place]  Chief Complaint: Abnormal lab.  HPI: Linda Glass is a 87 y.o. female with medical history significant of osteoarthritis, anxiety, varicella-zoster, history of heart murmur, hyperlipidemia hypertension, class I obesity, chronic atrial fibrillation who is coming to the emergency department due to having a low hemoglobin level.   The patient states that she has been very tired since last Friday.  She has had increased fatigue and hypersomnolence. She denies abdominal pain, melena or hematochezia.  No nausea, emesis, diarrhea, constipation, dysuria, frequency or hematuria.  She endorses lower extremity edema since Friday.  She has been having occasional palpitations for some time now, but denies chest pain, diaphoresis, dizziness, PND orthopnea.  No polyuria, polydipsia, polyphagia or blurred vision.  ED Course: Initial vital signs were temperature 97.5 F, pulse 77, respirations 17, BP 187/73 mmHg O2 sat 99% on room air.  The patient was started on a 2 packed RBC transfusion.  Lab work: Her urinalysis show a small hemoglobinuria.  CBC showed a white count of 3.7, hemoglobin 5.4 g/dL platelets 112.  Coronavirus PCR was positive.  BMP shows a potassium 3.4, CO2 of 21 mmol/L.  Renal function was normal.  Her magnesium level was 1.6 mg/dL.   Review of Systems: As per HPI otherwise all other systems reviewed and are negative.  Past Medical History:  Diagnosis Date  . Anxiety   . Arthritis   . Chicken pox   . Heart murmur   . Hyperlipidemia     Past Surgical History:  Procedure Laterality Date  . ABDOMINAL HYSTERECTOMY  1983  . FRACTURE SURGERY     wrist fracture 2013    Social History  reports that she has never smoked. She has never  used smokeless tobacco. She reports current alcohol use. She reports that she does not use drugs.  No Known Allergies  Family History  Problem Relation Age of Onset  . Diabetes Neg Hx    Prior to Admission medications   Medication Sig Start Date End Date Taking? Authorizing Provider  atorvastatin (LIPITOR) 40 MG tablet TAKE 1 TABLET EACH DAY. Patient taking differently: Take 40 mg by mouth daily. 02/18/20  Yes [name], [name], MD  citalopram (CELEXA) 10 MG tablet Take 1 tablet (10 mg total) by mouth daily. 02/18/20  Yes [name], [name], MD  Cyanocobalamin (VITAMIN B 12 PO) Take 1 tablet by mouth daily.   Yes [provider]  furosemide (LASIX) 20 MG tablet Take 1 tablet (20 mg total) by mouth in the morning. 05/01/20  Yes [name], [name], MD  LORazepam (ATIVAN) 0.5 MG tablet TAKE 1/2 TABLET AT BEDTIME AS NEEDED FOR ANXIETY. Patient taking differently: Take 0.25 mg by mouth daily as needed for anxiety. 02/22/20  Yes [name], [name], MD  metoprolol succinate (TOPROL-XL) 25 MG 24 hr tablet Take 1 tablet (25 mg total) by mouth daily. 02/11/20  Yes [name], [name], MD  Multiple Vitamin (MULTIVITAMIN WITH MINERALS) TABS tablet Take 1 tablet by mouth daily.   Yes [provider]  apixaban (ELIQUIS) 5 MG TABS tablet Take 1 tablet (5 mg total) by mouth 2 (two) times daily. Patient not taking: No sig reported 05/01/20 05/31/20  [name], [name], MD    Physical Exam: Vitals:   05/01/20 1915 05/01/20  1930 05/01/20 1945 05/01/20 2104  BP: (!) 196/80 (!) 169/84 (!) 180/76 (!) 193/71  Pulse: 72 64 79 85  Resp: (!) 21 19 (!) 22 17  Temp:   97.6 F (36.4 C) 97.8 F (36.6 C)  TempSrc:   Oral Oral  SpO2: 96% 96% 96% 95%  Weight:      Height:        Constitutional: NAD, calm, comfortable Eyes: PERRL, sclerae, lids and conjunctivae are pale. ENMT: Mucous membranes are moist. Posterior pharynx clear of any exudate or lesions. Neck: normal, supple, no masses, no thyromegaly Respiratory:  clear to auscultation bilaterally, no wheezing, no crackles. Normal respiratory effort. No accessory muscle use.  Cardiovascular: Irregularly irregular, no murmurs / rubs / gallops. No extremity edema. 2+ pedal pulses. No carotid bruits.  Abdomen: Obese, no distention.  Bowel sounds positive.  Soft, no tenderness, no masses palpated. No hepatosplenomegaly. Musculoskeletal: no clubbing / cyanosis.  Good ROM, no contractures. Normal muscle tone.  Skin: no acute rashes, lesions, ulcers on very limited dermatological examination. Neurologic: CN 2-12 grossly intact. Sensation intact, DTR normal. Strength 5/5 in all 4.  Psychiatric: Normal judgment and insight. Alert and oriented x 3. Normal mood.   Labs on Admission: I have personally reviewed following labs and imaging studies  CBC: Recent Labs  Lab 05/01/20 1500 05/01/20 1809  WBC 3.5* 3.7*  NEUTROABS 2.4 2.3  HGB 5.6 Repeated and verified X2.* 5.4*  HCT 19.4 Repeated and verified X2.* 20.6*  MCV 64.3* 70.3*  PLT 110.0* 112*    Basic Metabolic Panel: Recent Labs  Lab 05/01/20 1500 05/01/20 1809 05/01/20 2121  NA 139 137  --   K 3.4* 3.4*  --   CL 105 105  --   CO2 24 21*  --   GLUCOSE 108* 105*  --   BUN 10 11  --   CREATININE 0.65 0.59  --   CALCIUM 8.7 8.8*  --   MG  --   --  1.6*  PHOS  --   --  3.1    GFR: Estimated Creatinine Clearance: 51.1 mL/min (by C-G formula based on SCr of 0.59 mg/dL).  Liver Function Tests: No results for input(s): AST, ALT, ALKPHOS, BILITOT, PROT, ALBUMIN in the last 168 hours.  Urine analysis: No results found for: COLORURINE, APPEARANCEUR, LABSPEC, PHURINE, GLUCOSEU, HGBUR, BILIRUBINUR, KETONESUR, PROTEINUR, UROBILINOGEN, NITRITE, LEUKOCYTESUR  Radiological Exams on Admission: No results found.  EKG: Independently reviewed.  Vent. rate 74 BPM PR interval * ms QRS duration 112 ms QT/QTc 422/469 ms P-R-T axes * 20 58 Atrial fibrillation Borderline intraventricular conduction  delay Low voltage, precordial leads   Assessment/Plan Principal Problem:   Symptomatic anemia Observation/telemetry. Transfuse 2 units of PRBC. Monitor H&H. Keep n.p.o. Continue pantoprazole 40 mg IVP every 12 hours. Consult gastroenterology (secure chat message left).  Active Problems:   2019 novel coronavirus disease (COVID-19) Asymptomatic at this time. Obtain inflammatory markers. Check chest radiograph. Remdesivir per pharmacy.    Class 1 obesity Lifestyle modifications for Follow-up with PCP.    Hyperlipidemia, mixed Continue atorvastatin 40 mg p.o. daily.    Hypertension, essential, benign Continue furosemide 20 mg p.o. daily. Continue metoprolol XL 25 mg p.o. daily.    Anxiety disorder Continue lorazepam as needed. Continue citalopram 10 mg p.o. daily.    Chronic atrial fibrillation (HCC) CHA?DS?-VASc Score of at least 5. Apixaban held due to symptomatic anemia. Continue metoprolol for rate control.    Hypokalemia Likely due to diuretic use. Replacing. Follow-up  potassium level.    Hypomagnesemia Secondary to furosemide use. Replacement given. Oral magnesium supplement. Follow-up magnesium level as needed.    DVT prophylaxis: SCDs. Code Status:   Full code. Family Communication: Disposition Plan:   Patient is from:  Home.  Anticipated DC to:  Home.  Anticipated DC date:  05/03/2020.  Anticipated DC barriers: Clinical status GI consult. Consults called:  Secure chat left for gastroenterology. Admission status:  Observation/telemetry.  High due to symptomatic anemia with a hemoglobin level of 5.4 g/dL requiring to stay in the hospital for a 2 units of PRBC transfusion and gastroenterology evaluation.  The patient was also found to be incidentally positive for COVID-19.  Severity of Illness:  Reubin Milan MD [facility]  How to contact the [facility] Attending or Consulting provider [place] or covering provider during after hours [place], for this patient?   1. Check the care team in [facility] and look for a) attending/consulting [facility] provider listed and b) the [facility] team listed 2. Log into [email or web address] and use [place] universal password to access. If you do not have the password, please contact the hospital operator. 3. Locate the [facility] provider you are looking for under [facility] and page to a number that you can be directly reached. 4. If you still have difficulty reaching the provider, please page the [facility] (Director on Call) for the Hospitalists listed on amion for assistance.  05/01/2020, 10:55 PM   This document was prepared using Dragon voice recognition software and may contain some unintended transcription errors.

## 2020-05-02 ENCOUNTER — Observation Stay (HOSPITAL_COMMUNITY): Payer: Medicare Other

## 2020-05-02 DIAGNOSIS — I110 Hypertensive heart disease with heart failure: Secondary | ICD-10-CM | POA: Diagnosis present

## 2020-05-02 DIAGNOSIS — D500 Iron deficiency anemia secondary to blood loss (chronic): Secondary | ICD-10-CM | POA: Diagnosis present

## 2020-05-02 DIAGNOSIS — D61818 Other pancytopenia: Secondary | ICD-10-CM | POA: Diagnosis present

## 2020-05-02 DIAGNOSIS — E876 Hypokalemia: Secondary | ICD-10-CM | POA: Diagnosis present

## 2020-05-02 DIAGNOSIS — I4891 Unspecified atrial fibrillation: Secondary | ICD-10-CM | POA: Diagnosis not present

## 2020-05-02 DIAGNOSIS — U071 COVID-19: Secondary | ICD-10-CM | POA: Diagnosis present

## 2020-05-02 DIAGNOSIS — K922 Gastrointestinal hemorrhage, unspecified: Secondary | ICD-10-CM | POA: Diagnosis not present

## 2020-05-02 DIAGNOSIS — E669 Obesity, unspecified: Secondary | ICD-10-CM | POA: Diagnosis present

## 2020-05-02 DIAGNOSIS — D62 Acute posthemorrhagic anemia: Secondary | ICD-10-CM | POA: Diagnosis present

## 2020-05-02 DIAGNOSIS — I361 Nonrheumatic tricuspid (valve) insufficiency: Secondary | ICD-10-CM

## 2020-05-02 DIAGNOSIS — M1990 Unspecified osteoarthritis, unspecified site: Secondary | ICD-10-CM | POA: Diagnosis present

## 2020-05-02 DIAGNOSIS — E8809 Other disorders of plasma-protein metabolism, not elsewhere classified: Secondary | ICD-10-CM | POA: Diagnosis not present

## 2020-05-02 DIAGNOSIS — I2510 Atherosclerotic heart disease of native coronary artery without angina pectoris: Secondary | ICD-10-CM | POA: Diagnosis present

## 2020-05-02 DIAGNOSIS — I10 Essential (primary) hypertension: Secondary | ICD-10-CM

## 2020-05-02 DIAGNOSIS — T501X5A Adverse effect of loop [high-ceiling] diuretics, initial encounter: Secondary | ICD-10-CM | POA: Diagnosis present

## 2020-05-02 DIAGNOSIS — K254 Chronic or unspecified gastric ulcer with hemorrhage: Secondary | ICD-10-CM | POA: Diagnosis present

## 2020-05-02 DIAGNOSIS — T502X5A Adverse effect of carbonic-anhydrase inhibitors, benzothiadiazides and other diuretics, initial encounter: Secondary | ICD-10-CM | POA: Diagnosis present

## 2020-05-02 DIAGNOSIS — I5033 Acute on chronic diastolic (congestive) heart failure: Secondary | ICD-10-CM | POA: Diagnosis present

## 2020-05-02 DIAGNOSIS — D490 Neoplasm of unspecified behavior of digestive system: Secondary | ICD-10-CM | POA: Diagnosis not present

## 2020-05-02 DIAGNOSIS — I480 Paroxysmal atrial fibrillation: Secondary | ICD-10-CM

## 2020-05-02 DIAGNOSIS — K648 Other hemorrhoids: Secondary | ICD-10-CM | POA: Diagnosis present

## 2020-05-02 DIAGNOSIS — Z6832 Body mass index (BMI) 32.0-32.9, adult: Secondary | ICD-10-CM | POA: Diagnosis not present

## 2020-05-02 DIAGNOSIS — K259 Gastric ulcer, unspecified as acute or chronic, without hemorrhage or perforation: Secondary | ICD-10-CM | POA: Diagnosis not present

## 2020-05-02 DIAGNOSIS — K5730 Diverticulosis of large intestine without perforation or abscess without bleeding: Secondary | ICD-10-CM | POA: Diagnosis present

## 2020-05-02 DIAGNOSIS — F419 Anxiety disorder, unspecified: Secondary | ICD-10-CM | POA: Diagnosis present

## 2020-05-02 DIAGNOSIS — D649 Anemia, unspecified: Secondary | ICD-10-CM

## 2020-05-02 DIAGNOSIS — I482 Chronic atrial fibrillation, unspecified: Secondary | ICD-10-CM | POA: Diagnosis present

## 2020-05-02 DIAGNOSIS — D120 Benign neoplasm of cecum: Secondary | ICD-10-CM | POA: Diagnosis present

## 2020-05-02 DIAGNOSIS — K449 Diaphragmatic hernia without obstruction or gangrene: Secondary | ICD-10-CM | POA: Diagnosis present

## 2020-05-02 DIAGNOSIS — E782 Mixed hyperlipidemia: Secondary | ICD-10-CM | POA: Diagnosis present

## 2020-05-02 LAB — URINALYSIS, ROUTINE W REFLEX MICROSCOPIC
Bacteria, UA: NONE SEEN
Bilirubin Urine: NEGATIVE
Glucose, UA: NEGATIVE mg/dL
Ketones, ur: NEGATIVE mg/dL
Leukocytes,Ua: NEGATIVE
Nitrite: NEGATIVE
Protein, ur: NEGATIVE mg/dL
Specific Gravity, Urine: 1.006 (ref 1.005–1.030)
pH: 5 (ref 5.0–8.0)

## 2020-05-02 LAB — COMPREHENSIVE METABOLIC PANEL
ALT: 18 U/L (ref 0–44)
AST: 24 U/L (ref 15–41)
Albumin: 3.3 g/dL — ABNORMAL LOW (ref 3.5–5.0)
Alkaline Phosphatase: 110 U/L (ref 38–126)
Anion gap: 12 (ref 5–15)
BUN: 8 mg/dL (ref 8–23)
CO2: 23 mmol/L (ref 22–32)
Calcium: 8.5 mg/dL — ABNORMAL LOW (ref 8.9–10.3)
Chloride: 103 mmol/L (ref 98–111)
Creatinine, Ser: 0.45 mg/dL (ref 0.44–1.00)
GFR, Estimated: >60 mL/min (ref >60)
Glucose, Bld: 106 mg/dL — ABNORMAL HIGH (ref 70–99)
Potassium: 2.7 mmol/L — CL (ref 3.5–5.1)
Sodium: 138 mmol/L (ref 135–145)
Total Bilirubin: 2.5 mg/dL — ABNORMAL HIGH (ref 0.3–1.2)
Total Protein: 6.2 g/dL — ABNORMAL LOW (ref 6.5–8.1)

## 2020-05-02 LAB — CBC
HCT: 26.7 % — ABNORMAL LOW (ref 36.0–46.0)
Hemoglobin: 7.8 g/dL — ABNORMAL LOW (ref 12.0–15.0)
MCH: 21.3 pg — ABNORMAL LOW (ref 26.0–34.0)
MCHC: 29.2 g/dL — ABNORMAL LOW (ref 30.0–36.0)
MCV: 72.8 fL — ABNORMAL LOW (ref 80.0–100.0)
Platelets: 110 10*3/uL — ABNORMAL LOW (ref 150–400)
RBC: 3.67 MIL/uL — ABNORMAL LOW (ref 3.87–5.11)
RDW: 22.5 % — ABNORMAL HIGH (ref 11.5–15.5)
WBC: 6.2 10*3/uL (ref 4.0–10.5)
nRBC: 0.3 % — ABNORMAL HIGH (ref 0.0–0.2)

## 2020-05-02 LAB — BRAIN NATRIURETIC PEPTIDE: B Natriuretic Peptide: 596.4 pg/mL — ABNORMAL HIGH (ref 0.0–100.0)

## 2020-05-02 LAB — ECHOCARDIOGRAM COMPLETE
Area-P 1/2: 3.36 cm2
Calc EF: 78.7 %
Height: 62 in
MV M vel: 5.13 m/s
MV Peak grad: 105.3 mmHg
MV VTI: 1.51 cm2
Radius: 0.3 cm
S' Lateral: 2.2 cm
Single Plane A2C EF: 79.4 %
Single Plane A4C EF: 77.4 %
Weight: 2800 oz

## 2020-05-02 LAB — BASIC METABOLIC PANEL
Anion gap: 12 (ref 5–15)
BUN: 6 mg/dL — ABNORMAL LOW (ref 8–23)
CO2: 24 mmol/L (ref 22–32)
Calcium: 8.7 mg/dL — ABNORMAL LOW (ref 8.9–10.3)
Chloride: 101 mmol/L (ref 98–111)
Creatinine, Ser: 0.61 mg/dL (ref 0.44–1.00)
GFR, Estimated: >60 mL/min (ref >60)
Glucose, Bld: 159 mg/dL — ABNORMAL HIGH (ref 70–99)
Potassium: 2.9 mmol/L — ABNORMAL LOW (ref 3.5–5.1)
Sodium: 137 mmol/L (ref 135–145)

## 2020-05-02 LAB — IRON AND TIBC
Iron: 20 ug/dL — ABNORMAL LOW (ref 28–170)
Saturation Ratios: 4 % — ABNORMAL LOW (ref 10.4–31.8)
TIBC: 521 ug/dL — ABNORMAL HIGH (ref 250–450)
UIBC: 501 ug/dL

## 2020-05-02 LAB — C-REACTIVE PROTEIN: CRP: 1.3 mg/dL — ABNORMAL HIGH (ref <1.0)

## 2020-05-02 LAB — PROCALCITONIN: Procalcitonin: <0.1 ng/mL

## 2020-05-02 LAB — FIBRINOGEN: Fibrinogen: 221 mg/dL (ref 210–475)

## 2020-05-02 LAB — FERRITIN: Ferritin: 6 ng/mL — ABNORMAL LOW (ref 11–307)

## 2020-05-02 LAB — D-DIMER, QUANTITATIVE: D-Dimer, Quant: 1.61 ug/mL-FEU — ABNORMAL HIGH (ref 0.00–0.50)

## 2020-05-02 LAB — MAGNESIUM: Magnesium: 1.4 mg/dL — ABNORMAL LOW (ref 1.7–2.4)

## 2020-05-02 LAB — VITAMIN B12: Vitamin B-12: 5939 pg/mL — ABNORMAL HIGH (ref 180–914)

## 2020-05-02 MED ORDER — HYDROCOD POLST-CPM POLST ER 10-8 MG/5ML PO SUER
5.0000 mL | Freq: Two times a day (BID) | ORAL | Status: DC | PRN
Start: 2020-05-02 — End: 2020-05-12

## 2020-05-02 MED ORDER — FUROSEMIDE 10 MG/ML IJ SOLN
40.0000 mg | Freq: Two times a day (BID) | INTRAMUSCULAR | Status: DC
  Administered 2020-05-02 – 2020-05-04 (×4): 40 mg via INTRAVENOUS
  Filled 2020-05-02 (×4): qty 4

## 2020-05-02 MED ORDER — SODIUM CHLORIDE 0.9 % IV SOLN
100.0000 mg | Freq: Every day | INTRAVENOUS | Status: AC
  Administered 2020-05-03 – 2020-05-04 (×2): 100 mg via INTRAVENOUS
  Filled 2020-05-02 (×2): qty 20

## 2020-05-02 MED ORDER — HYDRALAZINE HCL 20 MG/ML IJ SOLN
10.0000 mg | INTRAMUSCULAR | Status: DC | PRN
  Administered 2020-05-02 – 2020-05-04 (×3): 10 mg via INTRAVENOUS
  Filled 2020-05-02 (×3): qty 1

## 2020-05-02 MED ORDER — SODIUM CHLORIDE 0.9 % IV SOLN
200.0000 mg | Freq: Once | INTRAVENOUS | Status: AC
  Administered 2020-05-02: 200 mg via INTRAVENOUS
  Filled 2020-05-02: qty 40

## 2020-05-02 MED ORDER — GUAIFENESIN-DM 100-10 MG/5ML PO SYRP: 10.0000 mL | ORAL_SOLUTION | ORAL | Status: DC | PRN

## 2020-05-02 MED ORDER — POTASSIUM CHLORIDE 10 MEQ/100ML IV SOLN
10.0000 meq | INTRAVENOUS | Status: AC
  Administered 2020-05-02 (×2): 10 meq via INTRAVENOUS
  Filled 2020-05-02 (×2): qty 100

## 2020-05-02 MED ORDER — POTASSIUM CHLORIDE CRYS ER 20 MEQ PO TBCR
40.0000 meq | EXTENDED_RELEASE_TABLET | Freq: Two times a day (BID) | ORAL | Status: AC
  Administered 2020-05-02 (×2): 40 meq via ORAL
  Filled 2020-05-02 (×2): qty 2

## 2020-05-02 MED ORDER — MAGNESIUM OXIDE 400 (241.3 MG) MG PO TABS
200.0000 mg | ORAL_TABLET | Freq: Every day | ORAL | Status: DC
  Administered 2020-05-02 – 2020-05-12 (×11): 200 mg via ORAL
  Filled 2020-05-02 (×11): qty 1

## 2020-05-02 MED ORDER — MAGNESIUM SULFATE 2 GM/50ML IV SOLN
2.0000 g | Freq: Once | INTRAVENOUS | Status: AC
  Administered 2020-05-02: 2 g via INTRAVENOUS
  Filled 2020-05-02: qty 50

## 2020-05-02 NOTE — Consult Note (Signed)
Cardiology Consultation:   Patient ID: Linda Glass MRN: [id number]; DOB: March 25, 1933  Admit date: 05/01/2020 Date of Consult: 05/02/2020  PCP:  [name], [name], [place]  Cardiologist:  No primary care provider on file. new Dr. Oval Linsey Advanced Practice Provider:  No care team member to display Electrophysiologist:  None        Patient Profile:   Linda Glass is a 87 y.o. female with a hx of HTN (white coat syndrome), anxiety, arthritis, new atrial fib. who is being seen today for the evaluation of atrial fib in setting of Hgb 5 at the request of Dr. Karleen Hampshire.  History of Present Illness:   Ms. Car with above hx and no known atrial fib -none listed in problem list or noted by PCP. Last EKG in 01/2020 was ST with first degree AV block she was placed on BB at that time.  She saw her PCP yesterday with fatigue and SOB. She felt she had the fatigue since BB started this was decreased to 12.5 mg..  She had also noted lower ext. Edema for about a week, this improves overnight. She did note palpatations she thought was related to anxiety.    She occ has abd pain.   Labs were drawn at visit and Hgb was  5.6 and HCT 19.4  plts 110 and WBC 3.5   She was also found to be in atrial fib  CHA2DS2VASc of 4 --she was to begin eliquis.  Labs returned before she began the eiquis   She was sent to ER for further eval.    EKG:  The EKG was personally reviewed and demonstrates:  Now atrial fib HR 74 and no ST changes.  Telemetry:  Telemetry was personally reviewed and demonstrates:  Atrial fib rate controlled  Her COVID screen is Positive.   FOB neg TSH 2.89  Mg+ 1.6  IRON 20 UIBC 501, TIBC 521 sat 4, ferritin 6 folate 15.8   PCXR IMPRESSION: 1. Cardiomegaly. 2. Bilateral interstitial prominence. Interstitial edema and/or pneumonitis could present this fashion. Small bilateral pleural effusions. 3. Left base  atelectasis/consolidation.  Transfused 2 units PRBC  BP 143/55 P 71 R 20 afebrile   Past Medical History:  Diagnosis Date  . Anxiety   . Arthritis   . Chicken pox   . Heart murmur   . Hyperlipidemia     Past Surgical History:  Procedure Laterality Date  . ABDOMINAL HYSTERECTOMY  1983  . FRACTURE SURGERY     wrist fracture 2013     Home Medications:  Prior to Admission medications   Medication Sig Start Date Michalina Calbert Date Taking? Authorizing Provider  atorvastatin (LIPITOR) 40 MG tablet TAKE 1 TABLET EACH DAY. Patient taking differently: Take 40 mg by mouth daily. 02/18/20  Yes [name], [name], MD  citalopram (CELEXA) 10 MG tablet Take 1 tablet (10 mg total) by mouth daily. 02/18/20  Yes [name], [name], MD  Cyanocobalamin (VITAMIN B 12 PO) Take 1 tablet by mouth daily.   Yes [provider]  furosemide (LASIX) 20 MG tablet Take 1 tablet (20 mg total) by mouth in the morning. 05/01/20  Yes [name], [name], MD  LORazepam (ATIVAN) 0.5 MG tablet TAKE 1/2 TABLET AT BEDTIME AS NEEDED FOR ANXIETY. Patient taking differently: Take 0.25 mg by mouth daily as needed for anxiety. 02/22/20  Yes [name], [name], MD  metoprolol succinate (TOPROL-XL) 25 MG 24 hr tablet Take 1 tablet (25 mg total)  by mouth daily. 02/11/20  Yes [name], [name], MD  Multiple Vitamin (MULTIVITAMIN WITH MINERALS) TABS tablet Take 1 tablet by mouth daily.   Yes [provider]  apixaban (ELIQUIS) 5 MG TABS tablet Take 1 tablet (5 mg total) by mouth 2 (two) times daily. Patient not taking: No sig reported 05/01/20 05/31/20  [name], [name], MD    Inpatient Medications: Scheduled Meds: . atorvastatin  40 mg Oral Daily  . citalopram  10 mg Oral Daily  . feeding supplement  1 Container Oral TID BM  . furosemide  20 mg Oral q AM  . magnesium oxide  200 mg Oral Daily  . metoprolol succinate  25 mg Oral Q2000  . pantoprazole (PROTONIX) IV  40 mg Intravenous Q12H   Continuous Infusions: . [START ON  05/03/2020] remdesivir 100 mg in NS 100 mL     PRN Meds: acetaminophen **OR** acetaminophen, chlorpheniramine-HYDROcodone, guaiFENesin-dextromethorphan, hydrALAZINE, LORazepam, ondansetron **OR** ondansetron (ZOFRAN) IV  Allergies:   No Known Allergies  Social History:   Social History   Socioeconomic History  . Marital status: Widowed    Spouse name: Not on file  . Number of children: Not on file  . Years of education: Not on file  . Highest education level: Not on file  Occupational History  . Not on file  Tobacco Use  . Smoking status: Never Smoker  . Smokeless tobacco: Never Used  Substance and Sexual Activity  . Alcohol use: Yes    Comment: Occasional  . Drug use: No  . Sexual activity: Not Currently  Other Topics Concern  . Not on file  Social History Narrative  . Not on file   Social Determinants of Health   Financial Resource Strain: Not on file  Food Insecurity: Not on file  Transportation Needs: Not on file  Physical Activity: Not on file  Stress: Not on file  Social Connections: Not on file  Intimate Partner Violence: Not on file    Family History:    Family History  Problem Relation Age of Onset  . Diabetes Neg Hx      ROS:  Please see the history of present illness.  General:no colds or fevers, no weight changes  + fatigue Skin:no rashes or ulcers HEENT:no blurred vision, no congestion CV:see HPI PUL:see HPI GI:no diarrhea constipation or melena, no indigestion GU:no hematuria, no dysuria MS:no joint pain, no claudication Neuro:no syncope, no lightheadedness Endo:no diabetes, no thyroid disease  All other ROS reviewed and negative.     Physical Exam/Data:   Vitals:   05/02/20 0255 05/02/20 0407 05/02/20 0443 05/02/20 0815  BP: (!) 155/65 (!) 135/54 (!) 143/55 (!) 172/67  Pulse: 82 68 71 81  Resp: 18 18 20 20   Temp: 98.9 F (37.2 C) 98.6 F (37 C) 98.2 F (36.8 C) 98.6 F (37 C)  TempSrc: Oral Oral Oral Oral  SpO2: 98% 96% 97% 95%   Weight:      Height:        Intake/Output Summary (Last 24 hours) at 05/02/2020 0850 Last data filed at 05/02/2020 0815 Gross per 24 hour  Intake 1163.67 ml  Output 2150 ml  Net -986.33 ml   Last 3 Weights 05/01/2020 05/01/2020 02/11/2020  Weight (lbs) 175 lb (No Data) 180 lb 3.2 oz  Weight (kg) 79.379 kg (No Data) 81.738 kg     Body mass index is 32.01 kg/m.  EXAM per Dr. Oval Linsey  Relevant CV Studies: Echo ordered  Laboratory Data:  High Sensitivity  Troponin:  No results for input(s): TROPONINIHS in the last 720 hours.   Chemistry Recent Labs  Lab 05/01/20 1500 05/01/20 1809  NA 139 137  K 3.4* 3.4*  CL 105 105  CO2 24 21*  GLUCOSE 108* 105*  BUN 10 11  CREATININE 0.65 0.59  CALCIUM 8.7 8.8*  GFRNONAA  --  >60  ANIONGAP  --  11    No results for input(s): PROT, ALBUMIN, AST, ALT, ALKPHOS, BILITOT in the last 168 hours. Hematology Recent Labs  Lab 05/01/20 1500 05/01/20 1809 05/01/20 2121  WBC 3.5* 3.7*  --   RBC 3.02* 2.93* 2.89*  HGB 5.6 Repeated and verified X2.* 5.4*  --   HCT 19.4 Repeated and verified X2.* 20.6*  --   MCV 64.3* 70.3*  --   MCH  --  18.4*  --   MCHC 28.9* 26.2*  --   RDW 21.7* 20.8*  --   PLT 110.0* 112*  --    BNP Recent Labs  Lab 05/01/20 1500  PROBNP 599.0*    DDimer No results for input(s): DDIMER in the last 168 hours.   Radiology/Studies:  DG CHEST PORT 1 VIEW  Result Date: 05/02/2020 CLINICAL DATA:  COVID-19. EXAM: PORTABLE CHEST 1 VIEW COMPARISON:  No prior. FINDINGS: Cardiomegaly. Bilateral interstitial prominence. Interstitial edema and/or pneumonitis could present this fashion. Left base atelectasis/consolidation. Small bilateral pleural effusions. No pneumothorax. IMPRESSION: 1. Cardiomegaly. 2. Bilateral interstitial prominence. Interstitial edema and/or pneumonitis could present this fashion. Small bilateral pleural effusions. 3. Left base atelectasis/consolidation. Electronically Signed   By: Marcello Moores  Register   On:  05/02/2020 05:12     Assessment and Plan:   1. Anemia with fatigue and edema, transfused. Follow up CBC pending.  Per IM  GI to see. 2. New atrial fib. Her rate is controlled.  She is on BB at 48, though her PCP was going to decrease due to fatigue but with her low HGB this would cause fatigue.   She would benefit from anticoagulation with CHA2DS2VASc of 4 but with anemia will not begin until evaluated. 3. Edema combination of anemia and atrial fib.  On lasix 20 mg po Dr. Oval Linsey to examine.  Echo pending. 4. HTN elevated this AM and has been up somewhat. On BB 5. COVID 19 per IM and receiving remdesivir.  6. HLD on statin continue 7. Hypokalemia and Mg+ replaced     Risk Assessment/Risk Scores:          CHA2DS2-VASc Score = 4  This indicates a 4.8% annual risk of stroke. The patient's score is based upon: CHF History: No HTN History: Yes Diabetes History: No Stroke History: No Vascular Disease History: No Age Score: 2 Gender Score: 1         For questions or updates, please contact [place] Please consult [email or web address] for contact info under    Signed, Cecilie Kicks, NP  05/02/2020 8:50 AM

## 2020-05-02 NOTE — Progress Notes (Signed)
°  Echocardiogram 2D Echocardiogram has been performed.  Linda Glass 05/02/2020, 11:11 AM

## 2020-05-02 NOTE — Consult Note (Signed)
Referring Provider:  [facility]         Primary Care Physician:  [name], [name], MD Primary Gastroenterologist: none            We were asked to see this patient for:  anemia                Attending physician's note   I have taken a history, examined the patient and reviewed the chart. I agree with the Advanced Practitioner's note, impression and recommendations.  87 year old female with severe iron deficiency anemia, fecal Hemoccult negative. Possible subacute blood loss, denies any melena or hematochezia.  She has shortness of breath, new onset A. fib and Covid positive We will tentatively plan for EGD and colonoscopy on Thursday for further evaluation of severe iron deficiency anemia once her cardiopulmonary status improves Transfuse to hemoglobin > than 7  Replete potassium and magnesium Continue supportive care  The risks and benefits as well as alternatives of endoscopic procedure(s) have been discussed and reviewed. All questions answered. The patient agrees to proceed.   The patient was provided an opportunity to ask questions and all were answered. The patient agreed with the plan and demonstrated an understanding of the instructions.  Damaris Hippo , MD [phone]       ASSESSMENT / PLAN:   # 87 yo female with profound iron deficiency anemia. Hgb 5.6 ( 14 in 2018). She reports intermittent dark stools but says it occurs when she eats something dark. Heme negative stool. Has been having occasional mild upper abdominal discomfort for a couple of months --Hgb improved to 7.8 post 2 uPRBC --For evaluation of iron deficiency anemia and upper abdominal discomfort patient will be scheduled for EGD and colonoscopy. Procedures possibly tomorrow, will check available times. Keep on clears today.  The risks and benefits of EGD and colonoscopy with possible polypectomy / biopsies were discussed and the patient agrees to proceed.  # Shortness of breath, probably  multifactorial with volume overload, new onset AFIB and severe anemia. She is COVID positive but no clear cut PNA on CXR.  --Shortness of breath improved post blood transfusion.   # Thrombocytopenia. Platelets 225 in 2018, now around 110K. She has very mild hypoalbuminemia, total bilirubin mildly elevated at 2.5.  --If abnormalities persist then can evaluate further with [place]  # Hypokalemia, K+ 2.7 / Low Mg+ level --TRH repleting  # COVID positive, asymptomatic. Getting Remdesivir  # New onset Atrial fibrillation. Cardiology has evaluated and would like to start anticoagulant after anemia evaluation.      HPI:                                                                                                                             Chief Complaint: anemia  Linda Glass is a 87 y.o. female, originally from [place] with history of HTN and anxiety. She saw PCP yesterday for fatigue, lower extremity edema and SHOB. EKG showed  Afib which was new for her. Labs returned with critical hgb.   Patient says she has occasionally noticed very dark stool but it is gnerally after she eats something dark.  She has had some vague upper abdominal discomfort at times.  Patient feels like the non-radiating upper abdominal discomfort is related to her statin because it occurs in the a.m. after taking the medication.  However, the upper abdominal discomfort has only been present for a couple of months and she has been on statin for several months.  She has seen no relationship between eating and the upper abdominal pain.  Patient occasionally gets loose stool depending on diet but that is not new.  She denies bowel changes.. Patient says she had a colonoscopy but it was greater than 10 years ago.  No family history of colon cancer  PREVIOUS ENDOSCOPIC EVALUATIONS / PERTINENT STUDIES   IMPRESSION: 1. Cardiomegaly. 2. Bilateral interstitial prominence. Interstitial edema and/or pneumonitis could  present this fashion. Small bilateral pleural effusions. 3. Left base atelectasis/consolidation.  Past Medical History:  Diagnosis Date  . Anxiety   . Arthritis   . Chicken pox   . Heart murmur   . Hyperlipidemia     Past Surgical History:  Procedure Laterality Date  . ABDOMINAL HYSTERECTOMY  1983  . FRACTURE SURGERY     wrist fracture 2013    Prior to Admission medications   Medication Sig Start Date End Date Taking? Authorizing Provider  atorvastatin (LIPITOR) 40 MG tablet TAKE 1 TABLET EACH DAY. Patient taking differently: Take 40 mg by mouth daily. 02/18/20  Yes [name], [name], MD  citalopram (CELEXA) 10 MG tablet Take 1 tablet (10 mg total) by mouth daily. 02/18/20  Yes [name], [name], MD  Cyanocobalamin (VITAMIN B 12 PO) Take 1 tablet by mouth daily.   Yes [provider]  furosemide (LASIX) 20 MG tablet Take 1 tablet (20 mg total) by mouth in the morning. 05/01/20  Yes [name], [name], MD  LORazepam (ATIVAN) 0.5 MG tablet TAKE 1/2 TABLET AT BEDTIME AS NEEDED FOR ANXIETY. Patient taking differently: Take 0.25 mg by mouth daily as needed for anxiety. 02/22/20  Yes [name], [name], MD  metoprolol succinate (TOPROL-XL) 25 MG 24 hr tablet Take 1 tablet (25 mg total) by mouth daily. 02/11/20  Yes [name], [name], MD  Multiple Vitamin (MULTIVITAMIN WITH MINERALS) TABS tablet Take 1 tablet by mouth daily.   Yes [provider]  apixaban (ELIQUIS) 5 MG TABS tablet Take 1 tablet (5 mg total) by mouth 2 (two) times daily. Patient not taking: No sig reported 05/01/20 05/31/20  [name], [name], MD    Current Facility-Administered Medications  Medication Dose Route Frequency Provider Last Rate Last Admin  . acetaminophen (TYLENOL) tablet 650 mg  650 mg Oral Q6H PRN Reubin Milan, MD       Or  . acetaminophen (TYLENOL) suppository 650 mg  650 mg Rectal Q6H PRN Reubin Milan, MD      . atorvastatin (LIPITOR) tablet 40 mg  40 mg Oral Daily Reubin Milan,  MD   40 mg at 05/02/20 2458  . chlorpheniramine-HYDROcodone (TUSSIONEX) 10-8 MG/5ML suspension 5 mL  5 mL Oral Q12H PRN Reubin Milan, MD      . citalopram (CELEXA) tablet 10 mg  10 mg Oral Daily Reubin Milan, MD   10 mg at 05/02/20 0998  . feeding supplement (BOOST / RESOURCE BREEZE) liquid 1 Container  1 Container Oral TID  BM Reubin Milan, MD      . furosemide (LASIX) tablet 20 mg  20 mg Oral q AM Reubin Milan, MD   20 mg at 05/02/20 [phone]  . guaiFENesin-dextromethorphan (ROBITUSSIN DM) 100-10 MG/5ML syrup 10 mL  10 mL Oral Q4H PRN Reubin Milan, MD      . hydrALAZINE (APRESOLINE) injection 10 mg  10 mg Intravenous Q4H PRN Rise Patience, MD   10 mg at 05/02/20 7829  . LORazepam (ATIVAN) tablet 0.25 mg  0.25 mg Oral Daily PRN Reubin Milan, MD      . magnesium oxide (MAG-OX) tablet 200 mg  200 mg Oral Daily Reubin Milan, MD   200 mg at 05/02/20 5621  . magnesium sulfate IVPB 2 g 50 mL  2 g Intravenous Once Hosie Poisson, MD 50 mL/hr at 05/02/20 1143 2 g at 05/02/20 1143  . metoprolol succinate (TOPROL-XL) 24 hr tablet 25 mg  25 mg Oral Q2000 Reubin Milan, MD   25 mg at 05/01/20 2320  . ondansetron (ZOFRAN) tablet 4 mg  4 mg Oral Q6H PRN Reubin Milan, MD       Or  . ondansetron [facility]) injection 4 mg  4 mg Intravenous Q6H PRN Reubin Milan, MD      . pantoprazole (PROTONIX) injection 40 mg  40 mg Intravenous Q12H Reubin Milan, MD   40 mg at 05/02/20 0835  . potassium chloride 10 mEq in 100 mL IVPB  10 mEq Intravenous Q1 Hr x 2 Hosie Poisson, MD      . potassium chloride SA (KLOR-CON) CR tablet 40 mEq  40 mEq Oral BID Hosie Poisson, MD      . Derrill Memo ON 05/03/2020] remdesivir 100 mg in sodium chloride 0.9 % 100 mL IVPB  100 mg Intravenous Daily Reubin Milan, MD        Allergies as of 05/01/2020  . (No Known Allergies)    Family History  Problem Relation Age of Onset  . Diabetes Neg Hx     Social History    Socioeconomic History  . Marital status: Widowed    Spouse name: Not on file  . Number of children: Not on file  . Years of education: Not on file  . Highest education level: Not on file  Occupational History  . Not on file  Tobacco Use  . Smoking status: Never Smoker  . Smokeless tobacco: Never Used  Substance and Sexual Activity  . Alcohol use: Yes    Comment: Occasional  . Drug use: No  . Sexual activity: Not Currently  Other Topics Concern  . Not on file  Social History Narrative  . Not on file   Social Determinants of Health   Financial Resource Strain: Not on file  Food Insecurity: Not on file  Transportation Needs: Not on file  Physical Activity: Not on file  Stress: Not on file  Social Connections: Not on file  Intimate Partner Violence: Not on file    Review of Systems: All systems reviewed and negative except where noted in HPI.    OBJECTIVE:    Physical Exam: Vital signs in last 24 hours: Temp:  [97.5 F (36.4 C)-98.9 F (37.2 C)] 98.6 F (37 C) (03/08 0815) Pulse Rate:  [64-90] 81 (03/08 0815) Resp:  [16-22] 20 (03/08 0815) BP: (130-196)/(54-93) 141/68 (03/08 0915) SpO2:  [94 %-99 %] 95 % (03/08 0815) Weight:  [79.4 kg] 79.4 kg (03/07 1743)   General:  Alert female in NAD Psych:  Pleasant, cooperative. Normal mood and affect. Eyes:  Pupils equal, sclera clear, no icterus.   Conjunctiva pink. Ears:  Normal auditory acuity. Nose:  No deformity, discharge,  or lesions. Neck:  Supple; no masses Lungs:  Clear throughout to auscultation.   No wheezes, crackles, or rhonchi.  Heart:  Regular rate , 2-3+ BLE edema Abdomen:  Soft, non-distended, nontender, BS active, no palp mass   Rectal:  Deferred  Msk:  Symmetrical without gross deformities. . Neurologic:  Alert and  oriented x4;  grossly normal neurologically. Skin:  Intact without significant lesions or rashes.  Filed Weights   05/01/20 1743  Weight: 79.4 kg     Scheduled inpatient  medications . atorvastatin  40 mg Oral Daily  . citalopram  10 mg Oral Daily  . feeding supplement  1 Container Oral TID BM  . furosemide  20 mg Oral q AM  . magnesium oxide  200 mg Oral Daily  . metoprolol succinate  25 mg Oral Q2000  . pantoprazole (PROTONIX) IV  40 mg Intravenous Q12H  . potassium chloride  40 mEq Oral BID      Intake/Output from previous day: 03/07 0701 - 03/08 0700 In: 709.7 [I.V.:128; Blood:291.7; IV Piggyback:290] Out: 2150 [Urine:2150] Intake/Output this shift: Total I/O In: 454 [Blood:454] Out: -    Lab Results: Recent Labs    05/01/20 1500 05/01/20 1809 05/02/20 1116  WBC 3.5* 3.7* 6.2  HGB 5.6 Repeated and verified X2.* 5.4* 7.8*  HCT 19.4 Repeated and verified X2.* 20.6* 26.7*  PLT 110.0* 112* 110*   BMET Recent Labs    05/01/20 1500 05/01/20 1809 05/02/20 1116  NA 139 137 138  K 3.4* 3.4* 2.7*  CL 105 105 103  CO2 24 21* 23  GLUCOSE 108* 105* 106*  BUN 10 11 8   CREATININE 0.65 0.59 0.45  CALCIUM 8.7 8.8* 8.5*   LFT Recent Labs    05/02/20 1116  PROT 6.2*  ALBUMIN 3.3*  AST 24  ALT 18  ALKPHOS 110  BILITOT 2.5*   PT/INR No results for input(s): LABPROT, INR in the last 72 hours. Hepatitis Panel No results for input(s): HEPBSAG, HCVAB, HEPAIGM, HEPBIGM in the last 72 hours.   . CBC Latest Ref Rng & Units 05/02/2020 05/01/2020 05/01/2020  WBC 4.0 - 10.5 K/uL 6.2 3.7(L) 3.5(L)  Hemoglobin 12.0 - 15.0 g/dL 7.8(L) 5.4(LL) 5.6 Repeated and verified X2.(LL)  Hematocrit 36.0 - 46.0 % 26.7(L) 20.6(L) 19.4 Repeated and verified X2.(LL)  Platelets 150 - 400 K/uL 110(L) 112(L) 110.0(L)    . CMP Latest Ref Rng & Units 05/02/2020 05/01/2020 05/01/2020  Glucose 70 - 99 mg/dL 106(H) 105(H) 108(H)  BUN 8 - 23 mg/dL 8 11 10   Creatinine 0.44 - 1.00 mg/dL 0.45 0.59 0.65  Sodium 135 - 145 mmol/L 138 137 139  Potassium 3.5 - 5.1 mmol/L 2.7(LL) 3.4(L) 3.4(L)  Chloride 98 - 111 mmol/L 103 105 105  CO2 22 - 32 mmol/L 23 21(L) 24  Calcium 8.9  - 10.3 mg/dL 8.5(L) 8.8(L) 8.7  Total Protein 6.5 - 8.1 g/dL 6.2(L) - -  Total Bilirubin 0.3 - 1.2 mg/dL 2.5(H) - -  Alkaline Phos 38 - 126 U/L 110 - -  AST 15 - 41 U/L 24 - -  ALT 0 - 44 U/L 18 - -   Studies/Results: DG CHEST PORT 1 VIEW  Result Date: 05/02/2020 CLINICAL DATA:  COVID-19. EXAM: PORTABLE CHEST 1 VIEW COMPARISON:  No prior. FINDINGS:  Cardiomegaly. Bilateral interstitial prominence. Interstitial edema and/or pneumonitis could present this fashion. Left base atelectasis/consolidation. Small bilateral pleural effusions. No pneumothorax. IMPRESSION: 1. Cardiomegaly. 2. Bilateral interstitial prominence. Interstitial edema and/or pneumonitis could present this fashion. Small bilateral pleural effusions. 3. Left base atelectasis/consolidation. Electronically Signed   By: Marcello Moores  Register   On: 05/02/2020 05:12    Principal Problem:   Symptomatic anemia Active Problems:   Class 1 obesity   Hyperlipidemia, mixed   Hypertension, essential, benign   Anxiety disorder   Chronic atrial fibrillation ([place])   Hypokalemia   2019 novel coronavirus disease (COVID-19)   Pancytopenia ([place])    Tye Savoy, NP-C @  05/02/2020, 12:27 PM

## 2020-05-02 NOTE — Progress Notes (Signed)
PROGRESS NOTE    Linda Glass  VXY:[id number] DOB: 11/23/1933 DOA: 05/01/2020 PCP: [name], [name], MD    Chief Complaint  Patient presents with  . abnormal labs    Brief Narrative: 87 year old lady with history of hypertension, osteoarthritis, hyperlipidemia, presented to ED for anemia, generalized weakness and fatigue.  On arrival to ED she was found to have a hemoglobin of 5.4, platelets of 112000.  She underwent 10 units of PRBC transfusion and her repeat hemoglobin is 7.8.  She was also diagnosed recently with new onset A. Fib.  Echocardiogram ordered, showed left ventricular ejection fraction of 60 to 65% with moderate LVH, moderately elevated pulmonary pressure.  She was started on IV Lasix 40 mg twice daily by cardiology.  GI consulted for evaluation of severe anemia.  Assessment & Plan:   Principal Problem:   Symptomatic anemia Active Problems:   Class 1 obesity   Hyperlipidemia, mixed   Hypertension, essential, benign   Anxiety disorder   Chronic atrial fibrillation (HCC)   Hypokalemia   2019 novel coronavirus disease (COVID-19)   Pancytopenia (HCC)   Paroxysmal atrial fibrillation (HCC)   Severe anemia/severe iron deficiency anemia Stool occult blood is negative, admitted with a hemoglobin of 5.4, underwent 2 units of PRBC transfusion and hemoglobin has improved to 7.8. Patient reports occasional black stools at home    Acute diastolic heart failure Fluid overloaded,  BNP elevated,  Lasix 40 mg IV bid. Strict intake  Appreciate cardiology recommendations.    Paroxysmal atrial fibrillation Rate controlled.  Echocardiogram reviewed.    Hypokalemia, hypomagnesemia Replaced  DVT prophylaxis: (SCDs Code Status: Full code) Family Communication: (None at bedside disposition:   Status is: Observation  The patient will require care spanning > 2 midnights and should be moved to inpatient because: Ongoing diagnostic testing needed not appropriate  for outpatient work up, Unsafe d/c plan, IV treatments appropriate due to intensity of illness or inability to take PO and Inpatient level of care appropriate due to severity of illness  Dispo: The patient is from: Home              Anticipated d/c is to: Home              Patient currently is not medically stable to d/c.   Difficult to place patient No       Consultants:   Gastroenterology  Cardiology  Procedures: Echo Antimicrobials: None  Subjective: Reports feeling weak  Objective: Vitals:   05/02/20 0443 05/02/20 0815 05/02/20 0915 05/02/20 1348  BP: (!) 143/55 (!) 172/67 (!) 141/68 (!) 151/67  Pulse: 71 81  82  Resp: 20 20  18   Temp: 98.2 F (36.8 C) 98.6 F (37 C)  98.4 F (36.9 C)  TempSrc: Oral Oral  Oral  SpO2: 97% 95%  95%  Weight:      Height:        Intake/Output Summary (Last 24 hours) at 05/02/2020 1707 Last data filed at 05/02/2020 0815 Gross per 24 hour  Intake 1163.67 ml  Output 2150 ml  Net -986.33 ml   Filed Weights   05/01/20 1743  Weight: 79.4 kg    Examination:  General exam: Appears calm and comfortable  Respiratory system: Clear to auscultation. Respiratory effort normal. Cardiovascular system: S1 & S2 heard, RRR.  2+ leg edema Gastrointestinal system: Abdomen is nondistended, soft and nontender.Normal bowel sounds heard. Central nervous system: Alert and oriented. No focal neurological deficits. Extremities: Symmetric 5 x 5 power. Skin: No  rashes, lesions or ulcers Psychiatry: Mood & affect appropriate.     Data Reviewed: I have personally reviewed following labs and imaging studies  CBC: Recent Labs  Lab 05/01/20 1500 05/01/20 1809 05/02/20 1116  WBC 3.5* 3.7* 6.2  NEUTROABS 2.4 2.3  --   HGB 5.6 Repeated and verified X2.* 5.4* 7.8*  HCT 19.4 Repeated and verified X2.* 20.6* 26.7*  MCV 64.3* 70.3* 72.8*  PLT 110.0* 112* 110*    Basic Metabolic Panel: Recent Labs  Lab 05/01/20 1500 05/01/20 1809 05/01/20 2121  05/02/20 1116  NA 139 137  --  138  K 3.4* 3.4*  --  2.7*  CL 105 105  --  103  CO2 24 21*  --  23  GLUCOSE 108* 105*  --  106*  BUN 10 11  --  8  CREATININE 0.65 0.59  --  0.45  CALCIUM 8.7 8.8*  --  8.5*  MG  --   --  1.6* 1.4*  PHOS  --   --  3.1  --     GFR: Estimated Creatinine Clearance: 51.1 mL/min (by C-G formula based on SCr of 0.45 mg/dL).  Liver Function Tests: Recent Labs  Lab 05/02/20 1116  AST 24  ALT 18  ALKPHOS 110  BILITOT 2.5*  PROT 6.2*  ALBUMIN 3.3*    CBG: No results for input(s): GLUCAP in the last 168 hours.   Recent Results (from the past 240 hour(s))  Resp Panel by RT-PCR (Flu A&B, Covid) Nasopharyngeal Swab     Status: Abnormal   Collection Time: 05/01/20  6:46 PM   Specimen: Nasopharyngeal Swab; Nasopharyngeal(NP) swabs in vial transport medium  Result Value Ref Range Status   SARS Coronavirus 2 by RT PCR POSITIVE (A) NEGATIVE Final    Comment: RESULT CALLED TO, READ BACK BY AND VERIFIED WITH: SMITH J. 03.07.22 @ 2056 BY MECIAL J. (NOTE) SARS-CoV-2 target nucleic acids are DETECTED.  The SARS-CoV-2 RNA is generally detectable in upper respiratory specimens during the acute phase of infection. Positive results are indicative of the presence of the identified virus, but do not rule out bacterial infection or co-infection with other pathogens not detected by the test. Clinical correlation with patient history and other diagnostic information is necessary to determine patient infection status. The expected result is Negative.  Fact Sheet for Patients: EntrepreneurPulse.com.au  Fact Sheet for Healthcare Providers: IncredibleEmployment.be  This test is not yet approved or cleared by the [place] FDA and  has been authorized for detection and/or diagnosis of SARS-CoV-2 by FDA under an Emergency Use Authorization (EUA).  This EUA will remain in effect (meaning this test ca n be used) for the  duration of  the COVID-19 declaration under Section 564(b)(1) of the Act, 21 U.S.C. section 360bbb-3(b)(1), unless the authorization is terminated or revoked sooner.     Influenza A by PCR NEGATIVE NEGATIVE Final   Influenza B by PCR NEGATIVE NEGATIVE Final    Comment: (NOTE) The Xpert Xpress SARS-CoV-2/FLU/RSV plus assay is intended as an aid in the diagnosis of influenza from Nasopharyngeal swab specimens and should not be used as a sole basis for treatment. Nasal washings and aspirates are unacceptable for Xpert Xpress SARS-CoV-2/FLU/RSV testing.  Fact Sheet for Patients: EntrepreneurPulse.com.au  Fact Sheet for Healthcare Providers: IncredibleEmployment.be  This test is not yet approved or cleared by the [place] FDA and has been authorized for detection and/or diagnosis of SARS-CoV-2 by FDA under an Emergency Use Authorization (EUA). This EUA will remain  in effect (meaning this test can be used) for the duration of the COVID-19 declaration under Section 564(b)(1) of the Act, 21 U.S.C. section 360bbb-3(b)(1), unless the authorization is terminated or revoked.  Performed at [facility], [place] [address], [place], [place] [address]          Radiology Studies: DG CHEST PORT 1 VIEW  Result Date: 05/02/2020 CLINICAL DATA:  COVID-19. EXAM: PORTABLE CHEST 1 VIEW COMPARISON:  No prior. FINDINGS: Cardiomegaly. Bilateral interstitial prominence. Interstitial edema and/or pneumonitis could present this fashion. Left base atelectasis/consolidation. Small bilateral pleural effusions. No pneumothorax. IMPRESSION: 1. Cardiomegaly. 2. Bilateral interstitial prominence. Interstitial edema and/or pneumonitis could present this fashion. Small bilateral pleural effusions. 3. Left base atelectasis/consolidation. Electronically Signed   By: Marcello Moores  Register   On: 05/02/2020 05:12   ECHOCARDIOGRAM COMPLETE  Result Date: 05/02/2020     ECHOCARDIOGRAM REPORT   Patient Name:   [facility] Date of Exam: 05/02/2020 Medical Rec #:  [id number]                   Height:       62.0 in Accession #:    [id number]                  Weight:       175.0 lb Date of Birth:  1933/11/23                   BSA:          1.806 m Patient Age:    46 years                    BP:           143/55 mmHg Patient Gender: F                           HR:           69 bpm. Exam Location:  Inpatient Procedure: 2D Echo, 3D Echo, Cardiac Doppler and Color Doppler Indications:    I48.91* Unspeicified atrial fibrillation  History:        Patient has no prior history of Echocardiogram examinations.                 Abnormal ECG, Arrythmias:Atrial Fibrillation; Risk                 Factors:Hypertension and Dyslipidemia. Covid infection 2019.                 Current covid infection. Heart block. Edema.  Sonographer:    Roseanna Rainbow RDCS Referring Phys: [id number] [place]  Sonographer Comments: Image acquisition challenging due to patient body habitus. IMPRESSIONS  1. Left ventricular ejection fraction, by estimation, is 60 to 65%. The left ventricle has normal function. The left ventricle has no regional wall motion abnormalities. There is moderate left ventricular hypertrophy. Left ventricular diastolic function  could not be evaluated.  2. Right ventricular systolic function is normal. The right ventricular size is normal. There is moderately elevated pulmonary artery systolic pressure. The estimated right ventricular systolic pressure is 60.1 mmHg.  3. Left atrial size was mildly dilated.  4. Right atrial size was mildly dilated.  5. The mitral valve is normal in structure. Mild mitral valve regurgitation.  6. The aortic valve is calcified. There is mild calcification of the aortic valve. There is mild thickening of the aortic valve. Aortic  valve regurgitation is not visualized. No aortic stenosis is present.  7. The inferior vena cava is dilated in size with <50%  respiratory variability, suggesting right atrial pressure of 15 mmHg. FINDINGS  Left Ventricle: Left ventricular ejection fraction, by estimation, is 60 to 65%. The left ventricle has normal function. The left ventricle has no regional wall motion abnormalities. The left ventricular internal cavity size was normal in size. There is  moderate left ventricular hypertrophy. Left ventricular diastolic function could not be evaluated due to atrial fibrillation. Left ventricular diastolic function could not be evaluated. Right Ventricle: The right ventricular size is normal. No increase in right ventricular wall thickness. Right ventricular systolic function is normal. There is moderately elevated pulmonary artery systolic pressure. The tricuspid regurgitant velocity is 3.12 m/s, and with an assumed right atrial pressure of 15 mmHg, the estimated right ventricular systolic pressure is 84.6 mmHg. Left Atrium: Left atrial size was mildly dilated. Right Atrium: Right atrial size was mildly dilated. Pericardium: There is no evidence of pericardial effusion. Mitral Valve: The mitral valve is normal in structure. There is mild thickening of the mitral valve leaflet(s). Mild mitral annular calcification. Mild mitral valve regurgitation. MV peak gradient, 17.8 mmHg. The mean mitral valve gradient is 3.0 mmHg. Tricuspid Valve: The tricuspid valve is normal in structure. Tricuspid valve regurgitation is mild. Aortic Valve: The aortic valve is calcified. There is mild calcification of the aortic valve. There is mild thickening of the aortic valve. Aortic valve regurgitation is not visualized. No aortic stenosis is present. Pulmonic Valve: The pulmonic valve was grossly normal. Pulmonic valve regurgitation is trivial. Aorta: The aortic root and ascending aorta are structurally normal, with no evidence of dilitation. Venous: The inferior vena cava is dilated in size with less than 50% respiratory variability, suggesting right atrial  pressure of 15 mmHg. IAS/Shunts: The atrial septum is grossly normal.  LEFT VENTRICLE PLAX 2D LVIDd:         4.30 cm LVIDs:         2.20 cm LV PW:         1.55 cm LV IVS:        1.35 cm LVOT diam:     1.80 cm LV SV:         66 LV SV Index:   36 LVOT Area:     2.54 cm  LV Volumes (MOD) LV vol d, MOD A2C: 68.3 ml LV vol d, MOD A4C: 70.8 ml LV vol s, MOD A2C: 14.1 ml LV vol s, MOD A4C: 16.0 ml LV SV MOD A2C:     54.2 ml LV SV MOD A4C:     70.8 ml LV SV MOD BP:      54.9 ml RIGHT VENTRICLE             IVC RV S prime:     10.40 cm/s  IVC diam: 2.40 cm TAPSE (M-mode): 2.1 cm LEFT ATRIUM           Index       RIGHT ATRIUM           Index LA diam:      4.50 cm 2.49 cm/m  RA Area:     17.50 cm LA Vol (A2C): 45.1 ml 24.97 ml/m RA Volume:   48.40 ml  26.80 ml/m LA Vol (A4C): 67.5 ml 37.37 ml/m  AORTIC VALVE LVOT Vmax:   130.00 cm/s LVOT Vmean:  92.100 cm/s LVOT VTI:    0.258 m  AORTA Ao  Root diam: 3.60 cm Ao Asc diam:  3.40 cm MITRAL VALVE                 TRICUSPID VALVE MV Area (PHT): 3.36 cm      TR Peak grad:   38.9 mmHg MV Area VTI:   1.51 cm      TR Vmax:        312.00 cm/s MV Peak grad:  17.8 mmHg MV Mean grad:  3.0 mmHg      SHUNTS MV Vmax:       2.11 m/s      Systemic VTI:  0.26 m MV Vmean:      80.1 cm/s     Systemic Diam: 1.80 cm MV Decel Time: 226 msec MR Peak grad:    105.3 mmHg MR Mean grad:    68.0 mmHg MR Vmax:         513.00 cm/s MR Vmean:        394.0 cm/s MR PISA:         0.57 cm MR PISA Eff ROA: 4 mm MR PISA Radius:  0.30 cm MV E velocity: 137.67 cm/s Mertie Moores MD Electronically signed by Mertie Moores MD Signature Date/Time: 05/02/2020/1:13:09 PM    Final         Scheduled Meds: . atorvastatin  40 mg Oral Daily  . citalopram  10 mg Oral Daily  . feeding supplement  1 Container Oral TID BM  . furosemide  40 mg Intravenous BID  . magnesium oxide  200 mg Oral Daily  . metoprolol succinate  25 mg Oral Q2000  . pantoprazole (PROTONIX) IV  40 mg Intravenous Q12H  . potassium chloride   40 mEq Oral BID   Continuous Infusions: . [START ON 05/03/2020] remdesivir 100 mg in NS 100 mL       LOS: 0 days       Hosie Poisson, MD [facility]   To contact the attending provider between 7A-7P or the covering provider during after hours 7P-7A, please log into the web site [email or web address] and access using universal [place] password for that web site. If you do not have the password, please call the hospital operator.  05/02/2020, 5:07 PM

## 2020-05-02 NOTE — Progress Notes (Signed)
Lab reports critical potassium of 2.7 at this time.  MD paged via [place].

## 2020-05-02 NOTE — Progress Notes (Signed)
87 year old caucasian female admitted for symptomatic anemia. Patient is alert and oriented but hard of hearing. Hears better on her right ear. During admission assessment, learned patient was COVID positive. Results not communicated to floor. Initiated patient's 1st unit of blood. Patient was c/o pain/burning when attempting to urinate. After receiving 40 mg IV lasix patient only voided approx 100 mL but said she felt a lot of pressure and needed to go. Received order to in and out cath patient. Drained approx 1200 mL. Patient reported relief. Abundio Miu MD notified and stated he'll add urinary retention to patient's problem list.   Report called to Colletta [place], RN of [facility]. Patient transported by bed to unit by RN. Tolerated transfer well.

## 2020-05-02 NOTE — Plan of Care (Signed)
  Problem: Education: Goal: Knowledge of General Education information will improve Description: Including pain rating scale, medication(s)/side effects and non-pharmacologic comfort measures Outcome: Progressing   Problem: Health Behavior/Discharge Planning: Goal: Ability to manage health-related needs will improve Outcome: Progressing   Problem: Pain Managment: Goal: General experience of comfort will improve Outcome: Progressing   

## 2020-05-03 DIAGNOSIS — K921 Melena: Secondary | ICD-10-CM | POA: Diagnosis present

## 2020-05-03 DIAGNOSIS — D649 Anemia, unspecified: Secondary | ICD-10-CM | POA: Diagnosis not present

## 2020-05-03 DIAGNOSIS — I480 Paroxysmal atrial fibrillation: Secondary | ICD-10-CM | POA: Diagnosis not present

## 2020-05-03 DIAGNOSIS — U071 COVID-19: Secondary | ICD-10-CM | POA: Diagnosis not present

## 2020-05-03 DIAGNOSIS — K922 Gastrointestinal hemorrhage, unspecified: Secondary | ICD-10-CM

## 2020-05-03 LAB — CBC WITH DIFFERENTIAL/PLATELET
Abs Immature Granulocytes: 0.04 10*3/uL (ref 0.00–0.07)
Basophils Absolute: 0 10*3/uL (ref 0.0–0.1)
Basophils Relative: 0 %
Eosinophils Absolute: 0.1 10*3/uL (ref 0.0–0.5)
Eosinophils Relative: 1 %
HCT: 26.2 % — ABNORMAL LOW (ref 36.0–46.0)
Hemoglobin: 7.6 g/dL — ABNORMAL LOW (ref 12.0–15.0)
Immature Granulocytes: 1 %
Lymphocytes Relative: 13 %
Lymphs Abs: 1 10*3/uL (ref 0.7–4.0)
MCH: 21.2 pg — ABNORMAL LOW (ref 26.0–34.0)
MCHC: 29 g/dL — ABNORMAL LOW (ref 30.0–36.0)
MCV: 73.2 fL — ABNORMAL LOW (ref 80.0–100.0)
Monocytes Absolute: 0.9 10*3/uL (ref 0.1–1.0)
Monocytes Relative: 11 %
Neutro Abs: 6 10*3/uL (ref 1.7–7.7)
Neutrophils Relative %: 74 %
Platelets: 101 10*3/uL — ABNORMAL LOW (ref 150–400)
RBC: 3.58 MIL/uL — ABNORMAL LOW (ref 3.87–5.11)
RDW: 22.8 % — ABNORMAL HIGH (ref 11.5–15.5)
WBC: 8 10*3/uL (ref 4.0–10.5)
nRBC: 0 % (ref 0.0–0.2)

## 2020-05-03 LAB — BASIC METABOLIC PANEL
Anion gap: 11 (ref 5–15)
BUN: 8 mg/dL (ref 8–23)
CO2: 25 mmol/L (ref 22–32)
Calcium: 8.4 mg/dL — ABNORMAL LOW (ref 8.9–10.3)
Chloride: 100 mmol/L (ref 98–111)
Creatinine, Ser: 0.44 mg/dL (ref 0.44–1.00)
GFR, Estimated: >60 mL/min (ref >60)
Glucose, Bld: 93 mg/dL (ref 70–99)
Potassium: 2.9 mmol/L — ABNORMAL LOW (ref 3.5–5.1)
Sodium: 136 mmol/L (ref 135–145)

## 2020-05-03 LAB — D-DIMER, QUANTITATIVE: D-Dimer, Quant: 2.15 ug/mL-FEU — ABNORMAL HIGH (ref 0.00–0.50)

## 2020-05-03 LAB — C-REACTIVE PROTEIN: CRP: 2.1 mg/dL — ABNORMAL HIGH (ref <1.0)

## 2020-05-03 LAB — MAGNESIUM: Magnesium: 1.5 mg/dL — ABNORMAL LOW (ref 1.7–2.4)

## 2020-05-03 LAB — POTASSIUM: Potassium: 3.2 mmol/L — ABNORMAL LOW (ref 3.5–5.1)

## 2020-05-03 LAB — FERRITIN: Ferritin: 9 ng/mL — ABNORMAL LOW (ref 11–307)

## 2020-05-03 LAB — PHOSPHORUS: Phosphorus: 3 mg/dL (ref 2.5–4.6)

## 2020-05-03 MED ORDER — PEG-KCL-NACL-NASULF-NA ASC-C 100 G PO SOLR: 1.0000 | Freq: Once | ORAL | Status: DC

## 2020-05-03 MED ORDER — PEG-KCL-NACL-NASULF-NA ASC-C 100 G PO SOLR
0.5000 | Freq: Once | ORAL | Status: AC
  Administered 2020-05-03: 100 g via ORAL
  Filled 2020-05-03: qty 1

## 2020-05-03 MED ORDER — PEG-KCL-NACL-NASULF-NA ASC-C 100 G PO SOLR
0.5000 | Freq: Once | ORAL | Status: AC
  Administered 2020-05-04: 100 g via ORAL

## 2020-05-03 MED ORDER — POTASSIUM CHLORIDE CRYS ER 20 MEQ PO TBCR
40.0000 meq | EXTENDED_RELEASE_TABLET | Freq: Two times a day (BID) | ORAL | Status: AC
  Administered 2020-05-03 (×2): 40 meq via ORAL
  Filled 2020-05-03 (×2): qty 2

## 2020-05-03 MED ORDER — MAGNESIUM SULFATE 4 GM/100ML IV SOLN
4.0000 g | Freq: Once | INTRAVENOUS | Status: AC
  Administered 2020-05-03: 4 g via INTRAVENOUS
  Filled 2020-05-03: qty 100

## 2020-05-03 MED ORDER — POTASSIUM CHLORIDE 10 MEQ/100ML IV SOLN
10.0000 meq | INTRAVENOUS | Status: AC
  Administered 2020-05-03 (×2): 10 meq via INTRAVENOUS
  Filled 2020-05-03: qty 100

## 2020-05-03 NOTE — Progress Notes (Signed)
Progress Note  Patient Name: Linda Glass Date of Encounter: 05/03/2020  [facility] Cardiologist: No primary care provider on file.   Subjective   Feeling much better.  Her breathing has improved.  Denies any chest pain or palpitations.  Inpatient Medications    Scheduled Meds: . atorvastatin  40 mg Oral Daily  . citalopram  10 mg Oral Daily  . feeding supplement  1 Container Oral TID BM  . furosemide  40 mg Intravenous BID  . magnesium oxide  200 mg Oral Daily  . metoprolol succinate  25 mg Oral Q2000  . pantoprazole (PROTONIX) IV  40 mg Intravenous Q12H  . peg 3350 powder  0.5 kit Oral Once   And  . [START ON 05/04/2020] peg 3350 powder  0.5 kit Oral Once  . potassium chloride  40 mEq Oral BID   Continuous Infusions: . magnesium sulfate bolus IVPB 4 g (05/03/20 1317)  . remdesivir 100 mg in NS 100 mL 100 mg (05/03/20 1127)   PRN Meds: acetaminophen **OR** acetaminophen, chlorpheniramine-HYDROcodone, guaiFENesin-dextromethorphan, hydrALAZINE, LORazepam, ondansetron **OR** ondansetron (ZOFRAN) IV   Vital Signs    Vitals:   05/02/20 0915 05/02/20 1348 05/02/20 2204 05/03/20 0526  BP: (!) 141/68 (!) 151/67 (!) 156/57 (!) 164/61  Pulse:  82 77 76  Resp:  _0 Temp:  98.4 F (36.9 C) 98.2 F (36.8 C) 98.2 F (36.8 C)  TempSrc:  Oral Oral Oral  SpO2:  95% 90% 91%  Weight:      Height:        Intake/Output Summary (Last 24 hours) at 05/03/2020 1418 Last data filed at 05/03/2020 1035 Gross per 24 hour  Intake --  Output 4400 ml  Net -4400 ml   Last 3 Weights 05/01/2020 05/01/2020 02/11/2020  Weight (lbs) 175 lb (No Data) 180 lb 3.2 oz  Weight (kg) 79.379 kg (No Data) 81.738 kg      Telemetry    Atrial fibrillation.  Rate less than 100 bpm.  PVCs.- Personally Reviewed  ECG    N/A- Personally Reviewed  Physical Exam   VS:  BP (!) 164/61 (BP Location: Left Arm)   Pulse 76   Temp 98.2 F (36.8 C) (Oral)   Resp 18   Ht _1  (1.575 m)    Wt 79.4 kg   SpO2 91%   BMI 32.01 kg/m  , BMI Body mass index is 32.01 kg/m. GENERAL:  Well appearing HEENT: Pupils equal round and reactive, fundi not visualized, oral mucosa unremarkable NECK:  No jugular venous distention, waveform within normal limits, carotid upstroke brisk and symmetric, no bruits, no thyromegaly LYMPHATICS:  No cervical adenopathy LUNGS:  Clear to auscultation bilaterally HEART: Irregularly irregular PMI not displaced or sustained,S1 and S2 within normal limits, no S3, no S4, no clicks, no rubs, no murmurs ABD:  Flat, positive bowel sounds normal in frequency in pitch, no bruits, no rebound, no guarding, no midline pulsatile mass, no hepatomegaly, no splenomegaly EXT:  2 plus pulses throughout, no edema, no cyanosis no clubbing SKIN:  No rashes no nodules NEURO:  Cranial nerves II through XII grossly intact, motor grossly intact throughout PSYCH:  Cognitively intact, oriented to person place and time  Labs    High Sensitivity Troponin:  No results for input(s): TROPONINIHS in the last 720 hours.    Chemistry Recent Labs  Lab 05/02/20 1116 05/02/20 2225 05/03/20 0332  NA 138 137 136  K 2.7* 2.9* 2.9*  CL 103 101  100  CO2 _0 GLUCOSE 106* 159* 93  BUN 8 6* 8  CREATININE 0.45 0.61 0.44  CALCIUM 8.5* 8.7* 8.4*  PROT 6.2*  --   --   ALBUMIN 3.3*  --   --   AST 24  --   --   ALT 18  --   --   ALKPHOS 110  --   --   BILITOT 2.5*  --   --   GFRNONAA >60 >60 >60  ANIONGAP _1 Hematology Recent Labs  Lab 05/01/20 1809 05/01/20 2121 05/02/20 1116 05/03/20 0332  WBC 3.7*  --  6.2 8.0  RBC 2.93* 2.89* 3.67* 3.58*  HGB 5.4*  --  7.8* 7.6*  HCT 20.6*  --  26.7* 26.2*  MCV 70.3*  --  72.8* 73.2*  MCH 18.4*  --  21.3* 21.2*  MCHC 26.2*  --  29.2* 29.0*  RDW 20.8*  --  22.5* 22.8*  PLT 112*  --  110* 101*    BNP Recent Labs  Lab 05/01/20 1500 05/02/20 1116  BNP  --  596.4*  PROBNP 599.0*  --      DDimer  Recent Labs  Lab  05/02/20 1116 05/03/20 0332  DDIMER 1.61* 2.15*     Radiology    DG CHEST PORT 1 VIEW  Result Date: 05/02/2020 CLINICAL DATA:  COVID-19. EXAM: PORTABLE CHEST 1 VIEW COMPARISON:  No prior. FINDINGS: Cardiomegaly. Bilateral interstitial prominence. Interstitial edema and/or pneumonitis could present this fashion. Left base atelectasis/consolidation. Small bilateral pleural effusions. No pneumothorax. IMPRESSION: 1. Cardiomegaly. 2. Bilateral interstitial prominence. Interstitial edema and/or pneumonitis could present this fashion. Small bilateral pleural effusions. 3. Left base atelectasis/consolidation. Electronically Signed   By: Marcello Moores  Register   On: 05/02/2020 05:12   ECHOCARDIOGRAM COMPLETE  Result Date: 05/02/2020    ECHOCARDIOGRAM REPORT   Patient Name:   Linda Glass Date of Exam: 05/02/2020 Medical Rec #:  [id number]                   Height:       62.0 in Accession #:    [id number]                  Weight:       175.0 lb Date of Birth:  03-Apr-1933                   BSA:          1.806 m Patient Age:    87 years                    BP:           143/55 mmHg Patient Gender: F                           HR:           69 bpm. Exam Location:  Inpatient Procedure: 2D Echo, 3D Echo, Cardiac Doppler and Color Doppler Indications:    I48.91* Unspeicified atrial fibrillation  History:        Patient has no prior history of Echocardiogram examinations.                 Abnormal ECG, Arrythmias:Atrial Fibrillation; Risk                 Factors:Hypertension and Dyslipidemia. Covid infection 2019.  Current covid infection. Heart block. Edema.  Sonographer:    Roseanna Rainbow RDCS Referring Phys: [id number] [place]  Sonographer Comments: Image acquisition challenging due to patient body habitus. IMPRESSIONS  1. Left ventricular ejection fraction, by estimation, is 60 to 65%. The left ventricle has normal function. The left ventricle has no regional wall motion abnormalities. There is  moderate left ventricular hypertrophy. Left ventricular diastolic function  could not be evaluated.  2. Right ventricular systolic function is normal. The right ventricular size is normal. There is moderately elevated pulmonary artery systolic pressure. The estimated right ventricular systolic pressure is 09.2 mmHg.  3. Left atrial size was mildly dilated.  4. Right atrial size was mildly dilated.  5. The mitral valve is normal in structure. Mild mitral valve regurgitation.  6. The aortic valve is calcified. There is mild calcification of the aortic valve. There is mild thickening of the aortic valve. Aortic valve regurgitation is not visualized. No aortic stenosis is present.  7. The inferior vena cava is dilated in size with <50% respiratory variability, suggesting right atrial pressure of 15 mmHg. FINDINGS  Left Ventricle: Left ventricular ejection fraction, by estimation, is 60 to 65%. The left ventricle has normal function. The left ventricle has no regional wall motion abnormalities. The left ventricular internal cavity size was normal in size. There is  moderate left ventricular hypertrophy. Left ventricular diastolic function could not be evaluated due to atrial fibrillation. Left ventricular diastolic function could not be evaluated. Right Ventricle: The right ventricular size is normal. No increase in right ventricular wall thickness. Right ventricular systolic function is normal. There is moderately elevated pulmonary artery systolic pressure. The tricuspid regurgitant velocity is 3.12 m/s, and with an assumed right atrial pressure of 15 mmHg, the estimated right ventricular systolic pressure is 33.0 mmHg. Left Atrium: Left atrial size was mildly dilated. Right Atrium: Right atrial size was mildly dilated. Pericardium: There is no evidence of pericardial effusion. Mitral Valve: The mitral valve is normal in structure. There is mild thickening of the mitral valve leaflet(s). Mild mitral annular  calcification. Mild mitral valve regurgitation. MV peak gradient, 17.8 mmHg. The mean mitral valve gradient is 3.0 mmHg. Tricuspid Valve: The tricuspid valve is normal in structure. Tricuspid valve regurgitation is mild. Aortic Valve: The aortic valve is calcified. There is mild calcification of the aortic valve. There is mild thickening of the aortic valve. Aortic valve regurgitation is not visualized. No aortic stenosis is present. Pulmonic Valve: The pulmonic valve was grossly normal. Pulmonic valve regurgitation is trivial. Aorta: The aortic root and ascending aorta are structurally normal, with no evidence of dilitation. Venous: The inferior vena cava is dilated in size with less than 50% respiratory variability, suggesting right atrial pressure of 15 mmHg. IAS/Shunts: The atrial septum is grossly normal.  LEFT VENTRICLE PLAX 2D LVIDd:         4.30 cm LVIDs:         2.20 cm LV PW:         1.55 cm LV IVS:        1.35 cm LVOT diam:     1.80 cm LV SV:         66 LV SV Index:   36 LVOT Area:     2.54 cm  LV Volumes (MOD) LV vol d, MOD A2C: 68.3 ml LV vol d, MOD A4C: 70.8 ml LV vol s, MOD A2C: 14.1 ml LV vol s, MOD A4C: 16.0 ml LV SV MOD A2C:  54.2 ml LV SV MOD A4C:     70.8 ml LV SV MOD BP:      54.9 ml RIGHT VENTRICLE             IVC RV S prime:     10.40 cm/s  IVC diam: 2.40 cm TAPSE (M-mode): 2.1 cm LEFT ATRIUM           Index       RIGHT ATRIUM           Index LA diam:      4.50 cm 2.49 cm/m  RA Area:     17.50 cm LA Vol (A2C): 45.1 ml 24.97 ml/m RA Volume:   48.40 ml  26.80 ml/m LA Vol (A4C): 67.5 ml 37.37 ml/m  AORTIC VALVE LVOT Vmax:   130.00 cm/s LVOT Vmean:  92.100 cm/s LVOT VTI:    0.258 m  AORTA Ao Root diam: 3.60 cm Ao Asc diam:  3.40 cm MITRAL VALVE                 TRICUSPID VALVE MV Area (PHT): 3.36 cm      TR Peak grad:   38.9 mmHg MV Area VTI:   1.51 cm      TR Vmax:        312.00 cm/s MV Peak grad:  17.8 mmHg MV Mean grad:  3.0 mmHg      SHUNTS MV Vmax:       2.11 m/s      Systemic VTI:   0.26 m MV Vmean:      80.1 cm/s     Systemic Diam: 1.80 cm MV Decel Time: 226 msec MR Peak grad:    105.3 mmHg MR Mean grad:    68.0 mmHg MR Vmax:         513.00 cm/s MR Vmean:        394.0 cm/s MR PISA:         0.57 cm MR PISA Eff ROA: 4 mm MR PISA Radius:  0.30 cm MV E velocity: 137.67 cm/s Mertie Moores MD Electronically signed by Mertie Moores MD Signature Date/Time: 05/02/2020/1:13:09 PM    Final     Cardiac Studies   Echo 05/02/2020: 1. Left ventricular ejection fraction, by estimation, is 60 to 65%. The  left ventricle has normal function. The left ventricle has no regional  wall motion abnormalities. There is moderate left ventricular hypertrophy.  Left ventricular diastolic function  could not be evaluated.  2. Right ventricular systolic function is normal. The right ventricular  size is normal. There is moderately elevated pulmonary artery systolic  pressure. The estimated right ventricular systolic pressure is 50.0 mmHg.  3. Left atrial size was mildly dilated.  4. Right atrial size was mildly dilated.  5. The mitral valve is normal in structure. Mild mitral valve  regurgitation.  6. The aortic valve is calcified. There is mild calcification of the  aortic valve. There is mild thickening of the aortic valve. Aortic valve  regurgitation is not visualized. No aortic stenosis is present.  7. The inferior vena cava is dilated in size with <50% respiratory  variability, suggesting right atrial pressure of 15 mmHg.   Patient Profile     87 y.o. female with hypertension admitted with shortness of breath in the setting of acute onset of anemia (hbg 5.4), new onset atrial fibrillation, acute diastolic heart failure and COVID-19 infection.     Assessment & Plan    # Paroxysmal atrial fibrillation: New  onset atrial fibrillation in the setting of Covid-19 infection and symptomatic anemia.  Her hemoglobin on admission was 5.4.  She is feeling much better after transfusion of packed  red blood cells.  A core she is not on anticoagulation in the setting of anemia and GI bleed.  She is very hypokalemic with diuresis.  Aggressively replete potassium to maintain greater than 4, magnesium greater than 2.  Continue metoprolol.  TSH is normal  # Acute diastolic heart failure:  Echo this imaging revealed LVEF 60 to 65% with moderate LVH.  Left and right atria were mildly dilated.  Right atrial pressure was 15.  She was net -2.7 L yesterday.  Respiratory status is improving but she is still volume overloaded.  Renal function is stable.  Continue diuresis with IV Lasix.  # GI bleed: # Anemia:  Going for upper and lower endoscopy tomorrow.  # Hypertension:  Blood pressure is poorly controlled.  We will add amlodipine 2.5 mg daily.  Until her H/H is stable we will not be very aggressive with her blood pressure control.  # Hyperlipidemia: Continue atorvastatin.  # COVID-19:  Respiratory status stable.  She is on room air.  Receiving remdesivir.      For questions or updates, please contact [place] Please consult [email or web address] for contact info under        Signed, [name], MD  05/03/2020, 2:18 PM  '

## 2020-05-03 NOTE — Progress Notes (Signed)
Initial Nutrition Assessment  DOCUMENTATION CODES:   Obesity unspecified  INTERVENTION:   -Boost Breeze po TID, each supplement provides 250 kcal and 9 grams of protein  -Multivitamin with minerals daily  NUTRITION DIAGNOSIS:   Increased nutrient needs related to acute illness as evidenced by estimated needs.  GOAL:   Patient will meet greater than or equal to 90% of their needs  MONITOR:   PO intake,Supplement acceptance,Labs,Weight trends,I & O's  REASON FOR ASSESSMENT:   Malnutrition Screening Tool    ASSESSMENT:   87 year old lady with history of hypertension, osteoarthritis, hyperlipidemia, presented to ED for anemia, generalized weakness and fatigue. Admitted for severe anemia.  Patient on clears and is planned for EGD/colonoscopy tomorrow 3/10 per GI note. Will be NPO after midnight.  Pt has had poor appetite for the past 2 days. Incidentally was COVID-19+ at admission. Boost Breeze was ordered, will continue at this time.  Per weight records, pt has lost 4 lbs since 02/11/20 (2% wt loss x 2.5 months, insignificant for time frame).  Per nursing documentation, pt with mild BLE edema.   Medications: IV Lasix, MAG-OX, IV Mg sulfate,  KLOR-CON  Labs reviewed: Low K, Mg  NUTRITION - FOCUSED PHYSICAL EXAM:  Deferred.  Diet Order:   Diet Order            Diet NPO time specified Except for: Sips with Meds  Diet effective midnight           Diet clear liquid Room service appropriate? Yes; Fluid consistency: Thin  Diet effective now                 EDUCATION NEEDS:   No education needs have been identified at this time  Skin:  Skin Assessment: Reviewed RN Assessment  Last BM:  3/9 -type 6  Height:   Ht Readings from Last 1 Encounters:  05/01/20 5\' 2"  (1.575 m)    Weight:   Wt Readings from Last 1 Encounters:  05/01/20 79.4 kg    BMI:  Body mass index is 32.01 kg/m.  Estimated Nutritional Needs:   Kcal:  1600-1800  Protein:   65-80g  Fluid:  1.8L/day  Clayton Bibles, MS, RD, LDN Inpatient Clinical Dietitian Contact information available via Amion

## 2020-05-03 NOTE — Progress Notes (Addendum)
PROGRESS NOTE    Linda Glass  UKG:[id number] DOB: 12/07/1933 DOA: 05/01/2020 PCP: [name], [name], MD    Chief Complaint  Patient presents with  . abnormal labs    Brief Narrative: 87 year old lady with history of hypertension, osteoarthritis, hyperlipidemia, presented to ED for anemia, generalized weakness and fatigue.  On arrival to ED she was found to have a hemoglobin of 5.4, platelets of 112000.  She underwent 10 units of PRBC transfusion and her repeat hemoglobin is 7.8.  She was also diagnosed recently with new onset A. Fib.  Echocardiogram ordered, showed left ventricular ejection fraction of 60 to 65% with moderate LVH, moderately elevated pulmonary pressure.  She was started on IV Lasix 40 mg twice daily by cardiology.  GI consulted for evaluation of severe anemia. Patient seen and examined at bedside, she appears to be in good spirits.  She continues to report weakness below she states she is urinated a lot over the last 24 hours.  Assessment & Plan:   Principal Problem:   Symptomatic anemia Active Problems:   Class 1 obesity   Hyperlipidemia, mixed   Hypertension, essential, benign   Anxiety disorder   Chronic atrial fibrillation (HCC)   Hypokalemia   COVID-19   Pancytopenia (HCC)   Paroxysmal atrial fibrillation (HCC)   Severe anemia/severe iron deficiency anemia Stool occult blood is negative, admitted with a hemoglobin of 5.4, underwent 2 units of PRBC transfusion and hemoglobin has improved to 7.8, hemoglobin stable at 7.6.  Ferritin levels at 6, 9.  She will probably need iron infusion prior to discharge.  She is scheduled for EGD and colonoscopy tomorrow.  Appreciate GI recommendations.  Continue with IV PPI twice daily Patient reports occasional black stools at home    Acute diastolic heart failure Fluid overloaded,  BNP elevated,  Lasix 40 mg IV bid. Strict intake she has diuresed about 5.3 L since admission.  Renal parameters appear to be  stable Appreciate cardiology recommendations.    Paroxysmal atrial fibrillation Rate controlled.  Echocardiogram showed left ventricular ejection fraction of 60 to 65% with moderate LVH. Her CHA2DS2-VASc score 4, she will need to be on anticoagulation which is being held for severe anemia and possibly GI bleed. Echocardiogram reviewed.    Hypokalemia, hypomagnesemia Replaced Repeat later today and replace as needed.   Mild thrombocytopenia of unclear etiology No obvious evidence of bleeding.   Hyperlipidemia continue with Lipitor    Covid 19 infection Patient is currently on room air, denies any shortness of breath or chest pain or cough. Complete the course of remdesivir.   Hypertension Blood pressure parameters slightly high, currently on Lasix 40 mg twice daily we will add hydralazine as needed   DVT prophylaxis: (SCDs Code Status: Full code) Family Communication: (None at bedside disposition:   Status is: Inpatient  The patient will require care spanning > 2 midnights and should be moved to inpatient because: Ongoing diagnostic testing needed not appropriate for outpatient work up, Unsafe d/c plan, IV treatments appropriate due to intensity of illness or inability to take PO and Inpatient level of care appropriate due to severity of illness  Dispo: The patient is from: Home              Anticipated d/c is to: Home              Patient currently is not medically stable to d/c.   Difficult to place patient No       Consultants:   Gastroenterology  Cardiology  Procedures: Echo Antimicrobials: None  Subjective: Generalized weakness, no chest pain or shortness of breath.  Objective: Vitals:   05/02/20 0915 05/02/20 1348 05/02/20 2204 05/03/20 0526  BP: (!) 141/68 (!) 151/67 (!) 156/57 (!) 164/61  Pulse:  82 77 76  Resp:  _0 Temp:  98.4 F (36.9 C) 98.2 F (36.8 C) 98.2 F (36.8 C)  TempSrc:  Oral Oral Oral  SpO2:  95% 90% 91%  Weight:       Height:        Intake/Output Summary (Last 24 hours) at 05/03/2020 1423 Last data filed at 05/03/2020 1035 Gross per 24 hour  Intake --  Output 4400 ml  Net -4400 ml   Filed Weights   05/01/20 1743  Weight: 79.4 kg    Examination:  General exam: Alert comfortable, not in distress Respiratory system: Diminished air entry at bases Cardiovascular system: S1 S2 heard, regular rate rhythm, 1+ leg edema present Gastrointestinal system: Abdomen is soft nontender nondistended bowel sounds normal Central nervous system: Alert and oriented, grossly nonfocal Extremities: Pedal edema present Skin: No rashes seen psychiatry: Mood is appropriate   Data Reviewed: I have personally reviewed following labs and imaging studies  CBC: Recent Labs  Lab 05/01/20 1500 05/01/20 1809 05/02/20 1116 05/03/20 0332  WBC 3.5* 3.7* 6.2 8.0  NEUTROABS 2.4 2.3  --  6.0  HGB 5.6 Repeated and verified X2.* 5.4* 7.8* 7.6*  HCT 19.4 Repeated and verified X2.* 20.6* 26.7* 26.2*  MCV 64.3* 70.3* 72.8* 73.2*  PLT 110.0* 112* 110* 101*    Basic Metabolic Panel: Recent Labs  Lab 05/01/20 1500 05/01/20 1809 05/01/20 2121 05/02/20 1116 05/02/20 2225 05/03/20 0332  NA 139 137  --  138 137 136  K 3.4* 3.4*  --  2.7* 2.9* 2.9*  CL 105 105  --  103 101 100  CO2 24 21*  --  _1 GLUCOSE 108* 105*  --  106* 159* 93  BUN 10 11  --  8 6* 8  CREATININE 0.65 0.59  --  0.45 0.61 0.44  CALCIUM 8.7 8.8*  --  8.5* 8.7* 8.4*  MG  --   --  1.6* 1.4*  --  1.5*  PHOS  --   --  3.1  --   --  3.0    GFR: Estimated Creatinine Clearance: 51.1 mL/min (by C-G formula based on SCr of 0.44 mg/dL).  Liver Function Tests: Recent Labs  Lab 05/02/20 1116  AST 24  ALT 18  ALKPHOS 110  BILITOT 2.5*  PROT 6.2*  ALBUMIN 3.3*    CBG: No results for input(s): GLUCAP in the last 168 hours.   Recent Results (from the past 240 hour(s))  Resp Panel by RT-PCR (Flu A&B, Covid) Nasopharyngeal Swab     Status:  Abnormal   Collection Time: 05/01/20  6:46 PM   Specimen: Nasopharyngeal Swab; Nasopharyngeal(NP) swabs in vial transport medium  Result Value Ref Range Status   SARS Coronavirus 2 by RT PCR POSITIVE (A) NEGATIVE Final    Comment: RESULT CALLED TO, READ BACK BY AND VERIFIED WITH: SMITH J. 03.07.22 @ 2056 BY MECIAL J. (NOTE) SARS-CoV-2 target nucleic acids are DETECTED.  The SARS-CoV-2 RNA is generally detectable in upper respiratory specimens during the acute phase of infection. Positive results are indicative of the presence of the identified virus, but do not rule out bacterial infection or co-infection with other pathogens not detected by the test. Clinical correlation  with patient history and other diagnostic information is necessary to determine patient infection status. The expected result is Negative.  Fact Sheet for Patients: EntrepreneurPulse.com.au  Fact Sheet for Healthcare Providers: IncredibleEmployment.be  This test is not yet approved or cleared by the [place] FDA and  has been authorized for detection and/or diagnosis of SARS-CoV-2 by FDA under an Emergency Use Authorization (EUA).  This EUA will remain in effect (meaning this test ca n be used) for the duration of  the COVID-19 declaration under Section 564(b)(1) of the Act, 21 U.S.C. section 360bbb-3(b)(1), unless the authorization is terminated or revoked sooner.     Influenza A by PCR NEGATIVE NEGATIVE Final   Influenza B by PCR NEGATIVE NEGATIVE Final    Comment: (NOTE) The Xpert Xpress SARS-CoV-2/FLU/RSV plus assay is intended as an aid in the diagnosis of influenza from Nasopharyngeal swab specimens and should not be used as a sole basis for treatment. Nasal washings and aspirates are unacceptable for Xpert Xpress SARS-CoV-2/FLU/RSV testing.  Fact Sheet for Patients: EntrepreneurPulse.com.au  Fact Sheet for Healthcare  Providers: IncredibleEmployment.be  This test is not yet approved or cleared by the [place] FDA and has been authorized for detection and/or diagnosis of SARS-CoV-2 by FDA under an Emergency Use Authorization (EUA). This EUA will remain in effect (meaning this test can be used) for the duration of the COVID-19 declaration under Section 564(b)(1) of the Act, 21 U.S.C. section 360bbb-3(b)(1), unless the authorization is terminated or revoked.  Performed at [facility], [place] [address], [place], [place] [address]          Radiology Studies: DG CHEST PORT 1 VIEW  Result Date: 05/02/2020 CLINICAL DATA:  COVID-19. EXAM: PORTABLE CHEST 1 VIEW COMPARISON:  No prior. FINDINGS: Cardiomegaly. Bilateral interstitial prominence. Interstitial edema and/or pneumonitis could present this fashion. Left base atelectasis/consolidation. Small bilateral pleural effusions. No pneumothorax. IMPRESSION: 1. Cardiomegaly. 2. Bilateral interstitial prominence. Interstitial edema and/or pneumonitis could present this fashion. Small bilateral pleural effusions. 3. Left base atelectasis/consolidation. Electronically Signed   By: Marcello Moores  Register   On: 05/02/2020 05:12   ECHOCARDIOGRAM COMPLETE  Result Date: 05/02/2020    ECHOCARDIOGRAM REPORT   Patient Name:   Linda Glass Date of Exam: 05/02/2020 Medical Rec #:  [id number]                   Height:       62.0 in Accession #:    [id number]                  Weight:       175.0 lb Date of Birth:  07-Sep-1933                   BSA:          1.806 m Patient Age:    40 years                    BP:           143/55 mmHg Patient Gender: F                           HR:           69 bpm. Exam Location:  Inpatient Procedure: 2D Echo, 3D Echo, Cardiac Doppler and Color Doppler Indications:    I48.91* Unspeicified atrial fibrillation  History:        Patient has no prior  history of Echocardiogram examinations.                  Abnormal ECG, Arrythmias:Atrial Fibrillation; Risk                 Factors:Hypertension and Dyslipidemia. Covid infection 2019.                 Current covid infection. Heart block. Edema.  Sonographer:    Roseanna Rainbow RDCS Referring Phys: [id number] [place]  Sonographer Comments: Image acquisition challenging due to patient body habitus. IMPRESSIONS  1. Left ventricular ejection fraction, by estimation, is 60 to 65%. The left ventricle has normal function. The left ventricle has no regional wall motion abnormalities. There is moderate left ventricular hypertrophy. Left ventricular diastolic function  could not be evaluated.  2. Right ventricular systolic function is normal. The right ventricular size is normal. There is moderately elevated pulmonary artery systolic pressure. The estimated right ventricular systolic pressure is 53.6 mmHg.  3. Left atrial size was mildly dilated.  4. Right atrial size was mildly dilated.  5. The mitral valve is normal in structure. Mild mitral valve regurgitation.  6. The aortic valve is calcified. There is mild calcification of the aortic valve. There is mild thickening of the aortic valve. Aortic valve regurgitation is not visualized. No aortic stenosis is present.  7. The inferior vena cava is dilated in size with <50% respiratory variability, suggesting right atrial pressure of 15 mmHg. FINDINGS  Left Ventricle: Left ventricular ejection fraction, by estimation, is 60 to 65%. The left ventricle has normal function. The left ventricle has no regional wall motion abnormalities. The left ventricular internal cavity size was normal in size. There is  moderate left ventricular hypertrophy. Left ventricular diastolic function could not be evaluated due to atrial fibrillation. Left ventricular diastolic function could not be evaluated. Right Ventricle: The right ventricular size is normal. No increase in right ventricular wall thickness. Right ventricular systolic function is  normal. There is moderately elevated pulmonary artery systolic pressure. The tricuspid regurgitant velocity is 3.12 m/s, and with an assumed right atrial pressure of 15 mmHg, the estimated right ventricular systolic pressure is 64.4 mmHg. Left Atrium: Left atrial size was mildly dilated. Right Atrium: Right atrial size was mildly dilated. Pericardium: There is no evidence of pericardial effusion. Mitral Valve: The mitral valve is normal in structure. There is mild thickening of the mitral valve leaflet(s). Mild mitral annular calcification. Mild mitral valve regurgitation. MV peak gradient, 17.8 mmHg. The mean mitral valve gradient is 3.0 mmHg. Tricuspid Valve: The tricuspid valve is normal in structure. Tricuspid valve regurgitation is mild. Aortic Valve: The aortic valve is calcified. There is mild calcification of the aortic valve. There is mild thickening of the aortic valve. Aortic valve regurgitation is not visualized. No aortic stenosis is present. Pulmonic Valve: The pulmonic valve was grossly normal. Pulmonic valve regurgitation is trivial. Aorta: The aortic root and ascending aorta are structurally normal, with no evidence of dilitation. Venous: The inferior vena cava is dilated in size with less than 50% respiratory variability, suggesting right atrial pressure of 15 mmHg. IAS/Shunts: The atrial septum is grossly normal.  LEFT VENTRICLE PLAX 2D LVIDd:         4.30 cm LVIDs:         2.20 cm LV PW:         1.55 cm LV IVS:        1.35 cm LVOT diam:     1.80  cm LV SV:         66 LV SV Index:   36 LVOT Area:     2.54 cm  LV Volumes (MOD) LV vol d, MOD A2C: 68.3 ml LV vol d, MOD A4C: 70.8 ml LV vol s, MOD A2C: 14.1 ml LV vol s, MOD A4C: 16.0 ml LV SV MOD A2C:     54.2 ml LV SV MOD A4C:     70.8 ml LV SV MOD BP:      54.9 ml RIGHT VENTRICLE             IVC RV S prime:     10.40 cm/s  IVC diam: 2.40 cm TAPSE (M-mode): 2.1 cm LEFT ATRIUM           Index       RIGHT ATRIUM           Index LA diam:      4.50 cm  2.49 cm/m  RA Area:     17.50 cm LA Vol (A2C): 45.1 ml 24.97 ml/m RA Volume:   48.40 ml  26.80 ml/m LA Vol (A4C): 67.5 ml 37.37 ml/m  AORTIC VALVE LVOT Vmax:   130.00 cm/s LVOT Vmean:  92.100 cm/s LVOT VTI:    0.258 m  AORTA Ao Root diam: 3.60 cm Ao Asc diam:  3.40 cm MITRAL VALVE                 TRICUSPID VALVE MV Area (PHT): 3.36 cm      TR Peak grad:   38.9 mmHg MV Area VTI:   1.51 cm      TR Vmax:        312.00 cm/s MV Peak grad:  17.8 mmHg MV Mean grad:  3.0 mmHg      SHUNTS MV Vmax:       2.11 m/s      Systemic VTI:  0.26 m MV Vmean:      80.1 cm/s     Systemic Diam: 1.80 cm MV Decel Time: 226 msec MR Peak grad:    105.3 mmHg MR Mean grad:    68.0 mmHg MR Vmax:         513.00 cm/s MR Vmean:        394.0 cm/s MR PISA:         0.57 cm MR PISA Eff ROA: 4 mm MR PISA Radius:  0.30 cm MV E velocity: 137.67 cm/s Mertie Moores MD Electronically signed by Mertie Moores MD Signature Date/Time: 05/02/2020/1:13:09 PM    Final         Scheduled Meds: . atorvastatin  40 mg Oral Daily  . citalopram  10 mg Oral Daily  . feeding supplement  1 Container Oral TID BM  . furosemide  40 mg Intravenous BID  . magnesium oxide  200 mg Oral Daily  . metoprolol succinate  25 mg Oral Q2000  . pantoprazole (PROTONIX) IV  40 mg Intravenous Q12H  . peg 3350 powder  0.5 kit Oral Once   And  . [START ON 05/04/2020] peg 3350 powder  0.5 kit Oral Once  . potassium chloride  40 mEq Oral BID   Continuous Infusions: . magnesium sulfate bolus IVPB 4 g (05/03/20 1317)  . remdesivir 100 mg in NS 100 mL 100 mg (05/03/20 1127)     LOS: 1 day       Hosie Poisson, MD [facility]   To contact the attending provider between 7A-7P or the covering provider  during after hours 7P-7A, please log into the web site [email or web address] and access using universal [place] password for that web site. If you do not have the password, please call the hospital operator.  05/03/2020, 2:23 PM

## 2020-05-03 NOTE — Progress Notes (Addendum)
Progress Note  Chief Complaint:    Anemia   Attending physician's note   I have taken an interval history, reviewed the chart and examined the patient. I agree with the Advanced Practitioner's note, impression and recommendations.   87 yr F with intermittent melena and severe iron deficiency anemia, new onset afib and Covid infection Patient's respiratory status has improved in the past 24 hours Hgb is stable Will plan to proceed with EGD and colonoscopy tomorrow Bowel prep and clear liquid diet NPO after 5AM tomorrow Replete electrolytes  The risks and benefits as well as alternatives of endoscopic procedure(s) have been discussed and reviewed. All questions answered. The patient agrees to proceed.    The patient was provided an opportunity to ask questions and all were answered. The patient agreed with the plan and demonstrated an understanding of the instructions.  Damaris Hippo , MD [phone]      ASSESSMENT / PLAN:    #87 yo female with profound iron deficiency anemia. Hgb 5.6 ( 14 in 2018). She reports intermittent dark stools but says it occurs when she eats something dark. Heme negative stool. Has been having occasional mild upper abdominal discomfort for a couple of months --Hgb stable at 7.6 post 2 uPRBC --For evaluation of iron deficiency anemia and upper abdominal discomfort patient will be scheduled for EGD and colonoscopy tomorrow. The risks and benefits of EGD and colonoscopy were already explained yesterday, she has no remaining questions.   # Shortness of breath, resolved. Probably multifactorial with volume overload, new onset AFIB and severe anemia. She is COVID positive but no clear cut PNA on CXR.  --Shortness of breath improved post blood transfusion.   # Thrombocytopenia. Platelets 225 in 2018, now around 110K. She has very mild hypoalbuminemia, total bilirubin mildly elevated at 2.5.  --If abnormalities persist then can evaluate further with  [place]  # Hypokalemia, K+ 2.7 / Low Mg+ level --Given potassium yesterday with no improvement. K+ 2.9 today. Will discuss with TRH to see about giving more K+ as we cannot proceed with endoscopic procedures in am if K+ remains low.   # COVID positive, asymptomatic. Getting Remdesivir  # New onset Atrial fibrillation. Cardiology has evaluated and would like to start anticoagulant after anemia evaluation.       SUBJECTIVE:   No complaints. Says shortness of breath has resolved and swelling in legs improved.     OBJECTIVE:    Scheduled inpatient medications:  . atorvastatin  40 mg Oral Daily  . citalopram  10 mg Oral Daily  . feeding supplement  1 Container Oral TID BM  . furosemide  40 mg Intravenous BID  . magnesium oxide  200 mg Oral Daily  . metoprolol succinate  25 mg Oral Q2000  . pantoprazole (PROTONIX) IV  40 mg Intravenous Q12H   Continuous inpatient infusions:  . magnesium sulfate bolus IVPB    . remdesivir 100 mg in NS 100 mL     PRN inpatient medications: acetaminophen **OR** acetaminophen, chlorpheniramine-HYDROcodone, guaiFENesin-dextromethorphan, hydrALAZINE, LORazepam, ondansetron **OR** ondansetron (ZOFRAN) IV  Vital signs in last 24 hours: Temp:  [98.2 F (36.8 C)-98.4 F (36.9 C)] 98.2 F (36.8 C) (03/09 0526) Pulse Rate:  [76-82] 76 (03/09 0526) Resp:  [18] 18 (03/09 0526) BP: (151-164)/(57-67) 164/61 (03/09 0526) SpO2:  [90 %-95 %] 91 % (03/09 0526) Last BM Date: 04/30/20  Intake/Output Summary (Last 24 hours) at 05/03/2020 1117 Last data filed at 05/03/2020 1035 Gross per 24 hour  Intake --  Output 4400 ml  Net -4400 ml     Physical Exam:  . General: Alert female in NAD . Heart:  Regular rate and rhythm, no significant lower extremity edema . Pulmonary: Normal respiratory effort . Abdomen: Soft, nondistended, nontender. Normal bowel sounds.  . Neurologic: Alert and oriented . Psych: Pleasant. Cooperative.   Filed Weights   05/01/20 1743   Weight: 79.4 kg    Intake/Output from previous day: 03/08 0701 - 03/09 0700 In: 454 [Blood:454] Out: 3200 [Urine:3200] Intake/Output this shift: Total I/O In: -  Out: 1200 [Urine:1200]    Lab Results: Recent Labs    05/01/20 1809 05/02/20 1116 05/03/20 0332  WBC 3.7* 6.2 8.0  HGB 5.4* 7.8* 7.6*  HCT 20.6* 26.7* 26.2*  PLT 112* 110* 101*   BMET Recent Labs    05/02/20 1116 05/02/20 2225 05/03/20 0332  NA 138 137 136  K 2.7* 2.9* 2.9*  CL 103 101 100  CO2 23 24 25   GLUCOSE 106* 159* 93  BUN 8 6* 8  CREATININE 0.45 0.61 0.44  CALCIUM 8.5* 8.7* 8.4*   LFT Recent Labs    05/02/20 1116  PROT 6.2*  ALBUMIN 3.3*  AST 24  ALT 18  ALKPHOS 110  BILITOT 2.5*   PT/INR No results for input(s): LABPROT, INR in the last 72 hours. Hepatitis Panel No results for input(s): HEPBSAG, HCVAB, HEPAIGM, HEPBIGM in the last 72 hours.  DG CHEST PORT 1 VIEW  Result Date: 05/02/2020 CLINICAL DATA:  COVID-19. EXAM: PORTABLE CHEST 1 VIEW COMPARISON:  No prior. FINDINGS: Cardiomegaly. Bilateral interstitial prominence. Interstitial edema and/or pneumonitis could present this fashion. Left base atelectasis/consolidation. Small bilateral pleural effusions. No pneumothorax. IMPRESSION: 1. Cardiomegaly. 2. Bilateral interstitial prominence. Interstitial edema and/or pneumonitis could present this fashion. Small bilateral pleural effusions. 3. Left base atelectasis/consolidation. Electronically Signed   By: Marcello Moores  Register   On: 05/02/2020 05:12   ECHOCARDIOGRAM COMPLETE  Result Date: 05/02/2020    ECHOCARDIOGRAM REPORT   Patient Name:   [facility] Date of Exam: 05/02/2020 Medical Rec #:  [id number]                   Height:       62.0 in Accession #:    [id number]                  Weight:       175.0 lb Date of Birth:  1933-07-15                   BSA:          1.806 m Patient Age:    87 years                    BP:           143/55 mmHg Patient Gender: F                            HR:           69 bpm. Exam Location:  Inpatient Procedure: 2D Echo, 3D Echo, Cardiac Doppler and Color Doppler Indications:    I48.91* Unspeicified atrial fibrillation  History:        Patient has no prior history of Echocardiogram examinations.                 Abnormal ECG, Arrythmias:Atrial Fibrillation; Risk  Factors:Hypertension and Dyslipidemia. Covid infection 2019.                 Current covid infection. Heart block. Edema.  Sonographer:    Roseanna Rainbow RDCS Referring Phys: [id number] [place]  Sonographer Comments: Image acquisition challenging due to patient body habitus. IMPRESSIONS  1. Left ventricular ejection fraction, by estimation, is 60 to 65%. The left ventricle has normal function. The left ventricle has no regional wall motion abnormalities. There is moderate left ventricular hypertrophy. Left ventricular diastolic function  could not be evaluated.  2. Right ventricular systolic function is normal. The right ventricular size is normal. There is moderately elevated pulmonary artery systolic pressure. The estimated right ventricular systolic pressure is 62.6 mmHg.  3. Left atrial size was mildly dilated.  4. Right atrial size was mildly dilated.  5. The mitral valve is normal in structure. Mild mitral valve regurgitation.  6. The aortic valve is calcified. There is mild calcification of the aortic valve. There is mild thickening of the aortic valve. Aortic valve regurgitation is not visualized. No aortic stenosis is present.  7. The inferior vena cava is dilated in size with <50% respiratory variability, suggesting right atrial pressure of 15 mmHg. FINDINGS  Left Ventricle: Left ventricular ejection fraction, by estimation, is 60 to 65%. The left ventricle has normal function. The left ventricle has no regional wall motion abnormalities. The left ventricular internal cavity size was normal in size. There is  moderate left ventricular hypertrophy. Left ventricular diastolic  function could not be evaluated due to atrial fibrillation. Left ventricular diastolic function could not be evaluated. Right Ventricle: The right ventricular size is normal. No increase in right ventricular wall thickness. Right ventricular systolic function is normal. There is moderately elevated pulmonary artery systolic pressure. The tricuspid regurgitant velocity is 3.12 m/s, and with an assumed right atrial pressure of 15 mmHg, the estimated right ventricular systolic pressure is 94.8 mmHg. Left Atrium: Left atrial size was mildly dilated. Right Atrium: Right atrial size was mildly dilated. Pericardium: There is no evidence of pericardial effusion. Mitral Valve: The mitral valve is normal in structure. There is mild thickening of the mitral valve leaflet(s). Mild mitral annular calcification. Mild mitral valve regurgitation. MV peak gradient, 17.8 mmHg. The mean mitral valve gradient is 3.0 mmHg. Tricuspid Valve: The tricuspid valve is normal in structure. Tricuspid valve regurgitation is mild. Aortic Valve: The aortic valve is calcified. There is mild calcification of the aortic valve. There is mild thickening of the aortic valve. Aortic valve regurgitation is not visualized. No aortic stenosis is present. Pulmonic Valve: The pulmonic valve was grossly normal. Pulmonic valve regurgitation is trivial. Aorta: The aortic root and ascending aorta are structurally normal, with no evidence of dilitation. Venous: The inferior vena cava is dilated in size with less than 50% respiratory variability, suggesting right atrial pressure of 15 mmHg. IAS/Shunts: The atrial septum is grossly normal.  LEFT VENTRICLE PLAX 2D LVIDd:         4.30 cm LVIDs:         2.20 cm LV PW:         1.55 cm LV IVS:        1.35 cm LVOT diam:     1.80 cm LV SV:         66 LV SV Index:   36 LVOT Area:     2.54 cm  LV Volumes (MOD) LV vol d, MOD A2C: 68.3 ml LV vol d, MOD A4C:  70.8 ml LV vol s, MOD A2C: 14.1 ml LV vol s, MOD A4C: 16.0 ml LV SV  MOD A2C:     54.2 ml LV SV MOD A4C:     70.8 ml LV SV MOD BP:      54.9 ml RIGHT VENTRICLE             IVC RV S prime:     10.40 cm/s  IVC diam: 2.40 cm TAPSE (M-mode): 2.1 cm LEFT ATRIUM           Index       RIGHT ATRIUM           Index LA diam:      4.50 cm 2.49 cm/m  RA Area:     17.50 cm LA Vol (A2C): 45.1 ml 24.97 ml/m RA Volume:   48.40 ml  26.80 ml/m LA Vol (A4C): 67.5 ml 37.37 ml/m  AORTIC VALVE LVOT Vmax:   130.00 cm/s LVOT Vmean:  92.100 cm/s LVOT VTI:    0.258 m  AORTA Ao Root diam: 3.60 cm Ao Asc diam:  3.40 cm MITRAL VALVE                 TRICUSPID VALVE MV Area (PHT): 3.36 cm      TR Peak grad:   38.9 mmHg MV Area VTI:   1.51 cm      TR Vmax:        312.00 cm/s MV Peak grad:  17.8 mmHg MV Mean grad:  3.0 mmHg      SHUNTS MV Vmax:       2.11 m/s      Systemic VTI:  0.26 m MV Vmean:      80.1 cm/s     Systemic Diam: 1.80 cm MV Decel Time: 226 msec MR Peak grad:    105.3 mmHg MR Mean grad:    68.0 mmHg MR Vmax:         513.00 cm/s MR Vmean:        394.0 cm/s MR PISA:         0.57 cm MR PISA Eff ROA: 4 mm MR PISA Radius:  0.30 cm MV E velocity: 137.67 cm/s Mertie Moores MD Electronically signed by Mertie Moores MD Signature Date/Time: 05/02/2020/1:13:09 PM    Final     PREVIOUS ENDOSCOPIES:      Principal Problem:   Symptomatic anemia Active Problems:   Class 1 obesity   Hyperlipidemia, mixed   Hypertension, essential, benign   Anxiety disorder   Chronic atrial fibrillation (HCC)   Hypokalemia   COVID-19   Pancytopenia (HCC)   Paroxysmal atrial fibrillation ([place])     LOS: 1 day   Tye Savoy ,NP 05/03/2020, 11:17 AM

## 2020-05-03 NOTE — H&P (View-Only) (Signed)
Progress Note  Chief Complaint:    Anemia   Attending physician's note   I have taken an interval history, reviewed the chart and examined the patient. I agree with the Advanced Practitioner's note, impression and recommendations.   8 yr F with intermittent melena and severe iron deficiency anemia, new onset afib and Covid infection Patient's respiratory status has improved in the past 24 hours Hgb is stable Will plan to proceed with EGD and colonoscopy tomorrow Bowel prep and clear liquid diet NPO after 5AM tomorrow Replete electrolytes  The risks and benefits as well as alternatives of endoscopic procedure(s) have been discussed and reviewed. All questions answered. The patient agrees to proceed.    The patient was provided an opportunity to ask questions and all were answered. The patient agreed with the plan and demonstrated an understanding of the instructions.  Linda Glass , MD [phone]      ASSESSMENT / PLAN:    #87 yo female with profound iron deficiency anemia. Hgb 5.6 ( 14 in 2018). She reports intermittent dark stools but says it occurs when she eats something dark. Heme negative stool. Has been having occasional mild upper abdominal discomfort for a couple of months --Hgb stable at 7.6 post 2 uPRBC --For evaluation of iron deficiency anemia and upper abdominal discomfort patient will be scheduled for EGD and colonoscopy tomorrow. The risks and benefits of EGD and colonoscopy were already explained yesterday, she has no remaining questions.   # Shortness of breath, resolved. Probably multifactorial with volume overload, new onset AFIB and severe anemia. She is COVID positive but no clear cut PNA on CXR.  --Shortness of breath improved post blood transfusion.   # Thrombocytopenia. Platelets 225 in 2018, now around 110K. She has very mild hypoalbuminemia, total bilirubin mildly elevated at 2.5.  --If abnormalities persist then can evaluate further with  [place]  # Hypokalemia, K+ 2.7 / Low Mg+ level --Given potassium yesterday with no improvement. K+ 2.9 today. Will discuss with TRH to see about giving more K+ as we cannot proceed with endoscopic procedures in am if K+ remains low.   # COVID positive, asymptomatic. Getting Remdesivir  # New onset Atrial fibrillation. Cardiology has evaluated and would like to start anticoagulant after anemia evaluation.       SUBJECTIVE:   No complaints. Says shortness of breath has resolved and swelling in legs improved.     OBJECTIVE:    Scheduled inpatient medications:  . atorvastatin  40 mg Oral Daily  . citalopram  10 mg Oral Daily  . feeding supplement  1 Container Oral TID BM  . furosemide  40 mg Intravenous BID  . magnesium oxide  200 mg Oral Daily  . metoprolol succinate  25 mg Oral Q2000  . pantoprazole (PROTONIX) IV  40 mg Intravenous Q12H   Continuous inpatient infusions:  . magnesium sulfate bolus IVPB    . remdesivir 100 mg in NS 100 mL     PRN inpatient medications: acetaminophen **OR** acetaminophen, chlorpheniramine-HYDROcodone, guaiFENesin-dextromethorphan, hydrALAZINE, LORazepam, ondansetron **OR** ondansetron (ZOFRAN) IV  Vital signs in last 24 hours: Temp:  [98.2 F (36.8 C)-98.4 F (36.9 C)] 98.2 F (36.8 C) (03/09 0526) Pulse Rate:  [76-82] 76 (03/09 0526) Resp:  [18] 18 (03/09 0526) BP: (151-164)/(57-67) 164/61 (03/09 0526) SpO2:  [90 %-95 %] 91 % (03/09 0526) Last BM Date: 04/30/20  Intake/Output Summary (Last 24 hours) at 05/03/2020 1117 Last data filed at 05/03/2020 1035 Gross per 24 hour  Intake --  Output 4400 ml  Net -4400 ml     Physical Exam:  . General: Alert female in NAD . Heart:  Regular rate and rhythm, no significant lower extremity edema . Pulmonary: Normal respiratory effort . Abdomen: Soft, nondistended, nontender. Normal bowel sounds.  . Neurologic: Alert and oriented . Psych: Pleasant. Cooperative.   Filed Weights   05/01/20 1743   Weight: 79.4 kg    Intake/Output from previous day: 03/08 0701 - 03/09 0700 In: 454 [Blood:454] Out: 3200 [Urine:3200] Intake/Output this shift: Total I/O In: -  Out: 1200 [Urine:1200]    Lab Results: Recent Labs    05/01/20 1809 05/02/20 1116 05/03/20 0332  WBC 3.7* 6.2 8.0  HGB 5.4* 7.8* 7.6*  HCT 20.6* 26.7* 26.2*  PLT 112* 110* 101*   BMET Recent Labs    05/02/20 1116 05/02/20 2225 05/03/20 0332  NA 138 137 136  K 2.7* 2.9* 2.9*  CL 103 101 100  CO2 23 24 25   GLUCOSE 106* 159* 93  BUN 8 6* 8  CREATININE 0.45 0.61 0.44  CALCIUM 8.5* 8.7* 8.4*   LFT Recent Labs    05/02/20 1116  PROT 6.2*  ALBUMIN 3.3*  AST 24  ALT 18  ALKPHOS 110  BILITOT 2.5*   PT/INR No results for input(s): LABPROT, INR in the last 72 hours. Hepatitis Panel No results for input(s): HEPBSAG, HCVAB, HEPAIGM, HEPBIGM in the last 72 hours.  DG CHEST PORT 1 VIEW  Result Date: 05/02/2020 CLINICAL DATA:  COVID-19. EXAM: PORTABLE CHEST 1 VIEW COMPARISON:  No prior. FINDINGS: Cardiomegaly. Bilateral interstitial prominence. Interstitial edema and/or pneumonitis could present this fashion. Left base atelectasis/consolidation. Small bilateral pleural effusions. No pneumothorax. IMPRESSION: 1. Cardiomegaly. 2. Bilateral interstitial prominence. Interstitial edema and/or pneumonitis could present this fashion. Small bilateral pleural effusions. 3. Left base atelectasis/consolidation. Electronically Signed   By: Marcello Moores  Register   On: 05/02/2020 05:12   ECHOCARDIOGRAM COMPLETE  Result Date: 05/02/2020    ECHOCARDIOGRAM REPORT   Patient Name:   Linda Glass Date of Exam: 05/02/2020 Medical Rec #:  [id number]                   Height:       62.0 in Accession #:    [id number]                  Weight:       175.0 lb Date of Birth:  1933/10/31                   BSA:          1.806 m Patient Age:    69 years                    BP:           143/55 mmHg Patient Gender: F                            HR:           69 bpm. Exam Location:  Inpatient Procedure: 2D Echo, 3D Echo, Cardiac Doppler and Color Doppler Indications:    I48.91* Unspeicified atrial fibrillation  History:        Patient has no prior history of Echocardiogram examinations.                 Abnormal ECG, Arrythmias:Atrial Fibrillation; Risk  Factors:Hypertension and Dyslipidemia. Covid infection 2019.                 Current covid infection. Heart block. Edema.  Sonographer:    Roseanna Rainbow RDCS Referring Phys: [id number] [place]  Sonographer Comments: Image acquisition challenging due to patient body habitus. IMPRESSIONS  1. Left ventricular ejection fraction, by estimation, is 60 to 65%. The left ventricle has normal function. The left ventricle has no regional wall motion abnormalities. There is moderate left ventricular hypertrophy. Left ventricular diastolic function  could not be evaluated.  2. Right ventricular systolic function is normal. The right ventricular size is normal. There is moderately elevated pulmonary artery systolic pressure. The estimated right ventricular systolic pressure is 52.8 mmHg.  3. Left atrial size was mildly dilated.  4. Right atrial size was mildly dilated.  5. The mitral valve is normal in structure. Mild mitral valve regurgitation.  6. The aortic valve is calcified. There is mild calcification of the aortic valve. There is mild thickening of the aortic valve. Aortic valve regurgitation is not visualized. No aortic stenosis is present.  7. The inferior vena cava is dilated in size with <50% respiratory variability, suggesting right atrial pressure of 15 mmHg. FINDINGS  Left Ventricle: Left ventricular ejection fraction, by estimation, is 60 to 65%. The left ventricle has normal function. The left ventricle has no regional wall motion abnormalities. The left ventricular internal cavity size was normal in size. There is  moderate left ventricular hypertrophy. Left ventricular diastolic  function could not be evaluated due to atrial fibrillation. Left ventricular diastolic function could not be evaluated. Right Ventricle: The right ventricular size is normal. No increase in right ventricular wall thickness. Right ventricular systolic function is normal. There is moderately elevated pulmonary artery systolic pressure. The tricuspid regurgitant velocity is 3.12 m/s, and with an assumed right atrial pressure of 15 mmHg, the estimated right ventricular systolic pressure is 41.3 mmHg. Left Atrium: Left atrial size was mildly dilated. Right Atrium: Right atrial size was mildly dilated. Pericardium: There is no evidence of pericardial effusion. Mitral Valve: The mitral valve is normal in structure. There is mild thickening of the mitral valve leaflet(s). Mild mitral annular calcification. Mild mitral valve regurgitation. MV peak gradient, 17.8 mmHg. The mean mitral valve gradient is 3.0 mmHg. Tricuspid Valve: The tricuspid valve is normal in structure. Tricuspid valve regurgitation is mild. Aortic Valve: The aortic valve is calcified. There is mild calcification of the aortic valve. There is mild thickening of the aortic valve. Aortic valve regurgitation is not visualized. No aortic stenosis is present. Pulmonic Valve: The pulmonic valve was grossly normal. Pulmonic valve regurgitation is trivial. Aorta: The aortic root and ascending aorta are structurally normal, with no evidence of dilitation. Venous: The inferior vena cava is dilated in size with less than 50% respiratory variability, suggesting right atrial pressure of 15 mmHg. IAS/Shunts: The atrial septum is grossly normal.  LEFT VENTRICLE PLAX 2D LVIDd:         4.30 cm LVIDs:         2.20 cm LV PW:         1.55 cm LV IVS:        1.35 cm LVOT diam:     1.80 cm LV SV:         66 LV SV Index:   36 LVOT Area:     2.54 cm  LV Volumes (MOD) LV vol d, MOD A2C: 68.3 ml LV vol d, MOD A4C:  70.8 ml LV vol s, MOD A2C: 14.1 ml LV vol s, MOD A4C: 16.0 ml LV SV  MOD A2C:     54.2 ml LV SV MOD A4C:     70.8 ml LV SV MOD BP:      54.9 ml RIGHT VENTRICLE             IVC RV S prime:     10.40 cm/s  IVC diam: 2.40 cm TAPSE (M-mode): 2.1 cm LEFT ATRIUM           Index       RIGHT ATRIUM           Index LA diam:      4.50 cm 2.49 cm/m  RA Area:     17.50 cm LA Vol (A2C): 45.1 ml 24.97 ml/m RA Volume:   48.40 ml  26.80 ml/m LA Vol (A4C): 67.5 ml 37.37 ml/m  AORTIC VALVE LVOT Vmax:   130.00 cm/s LVOT Vmean:  92.100 cm/s LVOT VTI:    0.258 m  AORTA Ao Root diam: 3.60 cm Ao Asc diam:  3.40 cm MITRAL VALVE                 TRICUSPID VALVE MV Area (PHT): 3.36 cm      TR Peak grad:   38.9 mmHg MV Area VTI:   1.51 cm      TR Vmax:        312.00 cm/s MV Peak grad:  17.8 mmHg MV Mean grad:  3.0 mmHg      SHUNTS MV Vmax:       2.11 m/s      Systemic VTI:  0.26 m MV Vmean:      80.1 cm/s     Systemic Diam: 1.80 cm MV Decel Time: 226 msec MR Peak grad:    105.3 mmHg MR Mean grad:    68.0 mmHg MR Vmax:         513.00 cm/s MR Vmean:        394.0 cm/s MR PISA:         0.57 cm MR PISA Eff ROA: 4 mm MR PISA Radius:  0.30 cm MV E velocity: 137.67 cm/s Mertie Moores MD Electronically signed by Mertie Moores MD Signature Date/Time: 05/02/2020/1:13:09 PM    Final     PREVIOUS ENDOSCOPIES:      Principal Problem:   Symptomatic anemia Active Problems:   Class 1 obesity   Hyperlipidemia, mixed   Hypertension, essential, benign   Anxiety disorder   Chronic atrial fibrillation (HCC)   Hypokalemia   COVID-19   Pancytopenia (HCC)   Paroxysmal atrial fibrillation ([place])     LOS: 1 day   Tye Savoy ,NP 05/03/2020, 11:17 AM

## 2020-05-04 DIAGNOSIS — E782 Mixed hyperlipidemia: Secondary | ICD-10-CM

## 2020-05-04 DIAGNOSIS — D649 Anemia, unspecified: Secondary | ICD-10-CM | POA: Diagnosis not present

## 2020-05-04 DIAGNOSIS — I482 Chronic atrial fibrillation, unspecified: Secondary | ICD-10-CM

## 2020-05-04 DIAGNOSIS — I10 Essential (primary) hypertension: Secondary | ICD-10-CM | POA: Diagnosis not present

## 2020-05-04 LAB — CBC WITH DIFFERENTIAL/PLATELET
Abs Immature Granulocytes: 0.04 10*3/uL (ref 0.00–0.07)
Basophils Absolute: 0 10*3/uL (ref 0.0–0.1)
Basophils Relative: 0 %
Eosinophils Absolute: 0.1 10*3/uL (ref 0.0–0.5)
Eosinophils Relative: 1 %
HCT: 26.6 % — ABNORMAL LOW (ref 36.0–46.0)
Hemoglobin: 7.6 g/dL — ABNORMAL LOW (ref 12.0–15.0)
Immature Granulocytes: 1 %
Lymphocytes Relative: 12 %
Lymphs Abs: 0.9 10*3/uL (ref 0.7–4.0)
MCH: 21.1 pg — ABNORMAL LOW (ref 26.0–34.0)
MCHC: 28.6 g/dL — ABNORMAL LOW (ref 30.0–36.0)
MCV: 73.9 fL — ABNORMAL LOW (ref 80.0–100.0)
Monocytes Absolute: 0.9 10*3/uL (ref 0.1–1.0)
Monocytes Relative: 11 %
Neutro Abs: 5.8 10*3/uL (ref 1.7–7.7)
Neutrophils Relative %: 75 %
Platelets: 111 10*3/uL — ABNORMAL LOW (ref 150–400)
RBC: 3.6 MIL/uL — ABNORMAL LOW (ref 3.87–5.11)
RDW: 23.8 % — ABNORMAL HIGH (ref 11.5–15.5)
WBC: 7.7 10*3/uL (ref 4.0–10.5)
nRBC: 0 % (ref 0.0–0.2)

## 2020-05-04 LAB — COMPREHENSIVE METABOLIC PANEL
ALT: 16 U/L (ref 0–44)
AST: 20 U/L (ref 15–41)
Albumin: 3.1 g/dL — ABNORMAL LOW (ref 3.5–5.0)
Alkaline Phosphatase: 97 U/L (ref 38–126)
Anion gap: 9 (ref 5–15)
BUN: 7 mg/dL — ABNORMAL LOW (ref 8–23)
CO2: 29 mmol/L (ref 22–32)
Calcium: 8.3 mg/dL — ABNORMAL LOW (ref 8.9–10.3)
Chloride: 97 mmol/L — ABNORMAL LOW (ref 98–111)
Creatinine, Ser: 0.58 mg/dL (ref 0.44–1.00)
GFR, Estimated: >60 mL/min (ref >60)
Glucose, Bld: 108 mg/dL — ABNORMAL HIGH (ref 70–99)
Potassium: 3.5 mmol/L (ref 3.5–5.1)
Sodium: 135 mmol/L (ref 135–145)
Total Bilirubin: 2 mg/dL — ABNORMAL HIGH (ref 0.3–1.2)
Total Protein: 5.9 g/dL — ABNORMAL LOW (ref 6.5–8.1)

## 2020-05-04 LAB — PHOSPHORUS: Phosphorus: 3 mg/dL (ref 2.5–4.6)

## 2020-05-04 LAB — D-DIMER, QUANTITATIVE: D-Dimer, Quant: 2.35 ug/mL-FEU — ABNORMAL HIGH (ref 0.00–0.50)

## 2020-05-04 LAB — MAGNESIUM: Magnesium: 1.9 mg/dL (ref 1.7–2.4)

## 2020-05-04 LAB — C-REACTIVE PROTEIN: CRP: 6.1 mg/dL — ABNORMAL HIGH (ref <1.0)

## 2020-05-04 LAB — FERRITIN: Ferritin: 10 ng/mL — ABNORMAL LOW (ref 11–307)

## 2020-05-04 MED ORDER — PEG-KCL-NACL-NASULF-NA ASC-C 100 G PO SOLR: 1.0000 | Freq: Once | ORAL | Status: DC

## 2020-05-04 MED ORDER — FUROSEMIDE 40 MG PO TABS
40.0000 mg | ORAL_TABLET | Freq: Every day | ORAL | Status: DC
  Administered 2020-05-05 – 2020-05-12 (×8): 40 mg via ORAL
  Filled 2020-05-04 (×9): qty 1

## 2020-05-04 MED ORDER — PEG-KCL-NACL-NASULF-NA ASC-C 100 G PO SOLR
0.5000 | Freq: Once | ORAL | Status: AC
  Administered 2020-05-04: 100 g via ORAL
  Filled 2020-05-04: qty 1

## 2020-05-04 MED ORDER — POTASSIUM CHLORIDE CRYS ER 20 MEQ PO TBCR
40.0000 meq | EXTENDED_RELEASE_TABLET | Freq: Once | ORAL | Status: AC
  Administered 2020-05-04: 40 meq via ORAL
  Filled 2020-05-04: qty 2

## 2020-05-04 MED ORDER — PEG-KCL-NACL-NASULF-NA ASC-C 100 G PO SOLR
0.5000 | Freq: Once | ORAL | Status: AC
  Administered 2020-05-05: 100 g via ORAL

## 2020-05-04 MED ORDER — AMLODIPINE BESYLATE 5 MG PO TABS
2.5000 mg | ORAL_TABLET | Freq: Every day | ORAL | Status: DC
  Administered 2020-05-04: 2.5 mg via ORAL
  Filled 2020-05-04: qty 1

## 2020-05-04 NOTE — Progress Notes (Signed)
Progress Note  Patient Name: Linda Glass Date of Encounter: 05/04/2020  [facility] Cardiologist: No primary care provider on file.   Subjective   Feeling much better.  Her breathing has improved.  Denies any chest pain or palpitations.  Inpatient Medications    Scheduled Meds: . atorvastatin  40 mg Oral Daily  . citalopram  10 mg Oral Daily  . feeding supplement  1 Container Oral TID BM  . furosemide  40 mg Intravenous BID  . magnesium oxide  200 mg Oral Daily  . metoprolol succinate  25 mg Oral Q2000  . pantoprazole (PROTONIX) IV  40 mg Intravenous Q12H  . peg 3350 powder  0.5 kit Oral Once   And  . [START ON 05/05/2020] peg 3350 powder  0.5 kit Oral Once   Continuous Infusions:  PRN Meds: acetaminophen **OR** acetaminophen, chlorpheniramine-HYDROcodone, guaiFENesin-dextromethorphan, hydrALAZINE, LORazepam, ondansetron **OR** ondansetron (ZOFRAN) IV   Vital Signs    Vitals:   05/03/20 2008 05/04/20 0407 05/04/20 0559 05/04/20 1331  BP: (!) 168/80 (!) 162/75 (!) 176/82 (!) 156/76  Pulse: 78 70 76 67  Resp: _0 Temp: 97.8 F (36.6 C) 97.8 F (36.6 C)  98.3 F (36.8 C)  TempSrc: Oral Oral  Oral  SpO2: 97% 99%  95%  Weight:      Height:        Intake/Output Summary (Last 24 hours) at 05/04/2020 1359 Last data filed at 05/04/2020 0926 Gross per 24 hour  Intake 234.47 ml  Output 1100 ml  Net -865.53 ml   Last 3 Weights 05/01/2020 05/01/2020 02/11/2020  Weight (lbs) 175 lb (No Data) 180 lb 3.2 oz  Weight (kg) 79.379 kg (No Data) 81.738 kg      Telemetry    Atrial fibrillation.  Rate less than 100 bpm.  PVCs.- Personally Reviewed  ECG    N/A- Personally Reviewed  Physical Exam   VS:  BP (!) 156/76 (BP Location: Right Arm)   Pulse 67   Temp 98.3 F (36.8 C) (Oral)   Resp 16   Ht _1  (1.575 m)   Wt 79.4 kg   SpO2 95%   BMI 32.01 kg/m  , BMI Body mass index is 32.01 kg/m. GENERAL:  Well appearing HEENT: Pupils equal round  and reactive, fundi not visualized, oral mucosa unremarkable NECK:  No jugular venous distention, waveform within normal limits, carotid upstroke brisk and symmetric, no bruits, no thyromegaly LYMPHATICS:  No cervical adenopathy LUNGS:  Clear to auscultation bilaterally HEART: Irregularly irregular PMI not displaced or sustained,S1 and S2 within normal limits, no S3, no S4, no clicks, no rubs, II/VI systolic murmur ABD:  Flat, positive bowel sounds normal in frequency in pitch, no bruits, no rebound, no guarding, no midline pulsatile mass, no hepatomegaly, no splenomegaly EXT:  2 plus pulses throughout, no edema, no cyanosis no clubbing SKIN:  No rashes no nodules NEURO:  Cranial nerves II through XII grossly intact, motor grossly intact throughout PSYCH:  Cognitively intact, oriented to person place and time  Labs    High Sensitivity Troponin:  No results for input(s): TROPONINIHS in the last 720 hours.    Chemistry Recent Labs  Lab 05/02/20 1116 05/02/20 2225 05/03/20 0332 05/03/20 1937 05/04/20 0358  NA 138 137 136  --  135  K 2.7* 2.9* 2.9* 3.2* 3.5  CL 103 101 100  --  97*  CO2 _2 --  29  GLUCOSE 106* 159* 93  --  108*  BUN 8 6* 8  --  7*  CREATININE 0.45 0.61 0.44  --  0.58  CALCIUM 8.5* 8.7* 8.4*  --  8.3*  PROT 6.2*  --   --   --  5.9*  ALBUMIN 3.3*  --   --   --  3.1*  AST 24  --   --   --  20  ALT 18  --   --   --  16  ALKPHOS 110  --   --   --  97  BILITOT 2.5*  --   --   --  2.0*  GFRNONAA >60 >60 >60  --  >60  ANIONGAP _0 --  9     Hematology Recent Labs  Lab 05/02/20 1116 05/03/20 0332 05/04/20 0358  WBC 6.2 8.0 7.7  RBC 3.67* 3.58* 3.60*  HGB 7.8* 7.6* 7.6*  HCT 26.7* 26.2* 26.6*  MCV 72.8* 73.2* 73.9*  MCH 21.3* 21.2* 21.1*  MCHC 29.2* 29.0* 28.6*  RDW 22.5* 22.8* 23.8*  PLT 110* 101* 111*    BNP Recent Labs  Lab 05/01/20 1500 05/02/20 1116  BNP  --  596.4*  PROBNP 599.0*  --      DDimer  Recent Labs  Lab  05/02/20 1116 05/03/20 0332 05/04/20 0358  DDIMER 1.61* 2.15* 2.35*     Radiology    No results found.  Cardiac Studies   Echo 05/02/2020: 1. Left ventricular ejection fraction, by estimation, is 60 to 65%. The  left ventricle has normal function. The left ventricle has no regional  wall motion abnormalities. There is moderate left ventricular hypertrophy.  Left ventricular diastolic function  could not be evaluated.  2. Right ventricular systolic function is normal. The right ventricular  size is normal. There is moderately elevated pulmonary artery systolic  pressure. The estimated right ventricular systolic pressure is 82.8 mmHg.  3. Left atrial size was mildly dilated.  4. Right atrial size was mildly dilated.  5. The mitral valve is normal in structure. Mild mitral valve  regurgitation.  6. The aortic valve is calcified. There is mild calcification of the  aortic valve. There is mild thickening of the aortic valve. Aortic valve  regurgitation is not visualized. No aortic stenosis is present.  7. The inferior vena cava is dilated in size with <50% respiratory  variability, suggesting right atrial pressure of 15 mmHg.   Patient Profile     87 y.o. female with hypertension admitted with shortness of breath in the setting of acute onset of anemia (hbg 5.4), new onset atrial fibrillation, acute diastolic heart failure and COVID-19 infection.     Assessment & Plan    # Paroxysmal atrial fibrillation: New onset atrial fibrillation in the setting of Covid-19 infection and symptomatic anemia.  Her hemoglobin on admission was 5.4.  She is feeling much better after transfusion of packed red blood cells.  She is not on anticoagulation in the setting of anemia and GI bleed.  Hypokalemia improved.  Continue metoprolol.  TSH is normal  # Acute diastolic heart failure:  Echo this imaging revealed LVEF 60 to 65% with moderate LVH.  Left and right atria were mildly dilated.  Right  atrial pressure was 15.  She was net -1.8L yesterday. She is -6L since admission.  Respiratory status is improving but she is still volume overloaded. Switch lasix to 59m po daily tomorrow.  # GI bleed: # Anemia:  Going for upper and lower endoscopy tomorrow.  SHe did not complete her bowl prep last night.  # Hypertension:  Blood pressure is poorly controlled.  Add amlodipine 2.59m.  We will be more aggressive with BP control once we find the source of her anemia.  # Hyperlipidemia: Continue atorvastatin.  # COVID-19:  Respiratory status stable.  She is on room air.  Receiving remdesivir.      For questions or updates, please contact CK[place] CityPlease consult [email or web address] for contact info under        Signed, TSkeet Latch MD  05/04/2020, 1:59 PM  '

## 2020-05-04 NOTE — Progress Notes (Signed)
Nursing notes reviewed, patient didn't complete prep. Unable to proceed with colonoscopy today. Will need to reprep her this evening for EGD and colonoscopy tomorrow. Clear liquids today, NPO after midnight.

## 2020-05-04 NOTE — Plan of Care (Signed)
  Problem: Education: Goal: Knowledge of General Education information will improve Description: Including pain rating scale, medication(s)/side effects and non-pharmacologic comfort measures Outcome: Progressing   Problem: Health Behavior/Discharge Planning: Goal: Ability to manage health-related needs will improve Outcome: Progressing   Problem: Clinical Measurements: Goal: Ability to maintain clinical measurements within normal limits will improve Outcome: Progressing   Problem: Clinical Measurements: Goal: Diagnostic test results will improve Outcome: Progressing   Problem: Activity: Goal: Risk for activity intolerance will decrease Outcome: Progressing

## 2020-05-04 NOTE — Progress Notes (Signed)
PROGRESS NOTE    Channel Papandrea  YBO:[id number] DOB: Jul 24, 1933 DOA: 05/01/2020 PCP: [name], [name], MD    Chief Complaint  Patient presents with  . abnormal labs    Brief Narrative: 87 year old lady with history of hypertension, osteoarthritis, hyperlipidemia, presented to ED for anemia, generalized weakness and fatigue.  On arrival to ED she was found to have a hemoglobin of 5.4, platelets of 112000.  She underwent 10 units of PRBC transfusion and her repeat hemoglobin is 7.8.  She was also diagnosed recently with new onset A. Fib.  Echocardiogram ordered, showed left ventricular ejection fraction of 60 to 65% with moderate LVH, moderately elevated pulmonary pressure.  She was started on IV Lasix 40 mg twice daily by cardiology.  GI consulted for evaluation of severe anemia. Pt seen and examined at bedside, denies any new complaints. No nausea, vomiting.  Did not do bowel prep last night, and EGD and colonoscopy post poned to tomorrow.   Assessment & Plan:   Principal Problem:   Symptomatic anemia Active Problems:   Class 1 obesity   Hyperlipidemia, mixed   Hypertension, essential, benign   Anxiety disorder   Chronic atrial fibrillation (HCC)   Hypokalemia   2019 novel coronavirus disease (COVID-19)   Pancytopenia (HCC)   Paroxysmal atrial fibrillation (HCC)   Melena   Severe anemia/severe iron deficiency anemia Stool occult blood is negative, admitted with a hemoglobin of 5.4, underwent 2 units of PRBC transfusion and hemoglobin has improved to 7.8, hemoglobin stable at 7.6.  Ferritin levels at 6, 9.  She will probably need iron infusion prior to discharge.  She is scheduled for EGD and colonoscopy tomorrow as she could not complete the bowel prep last night.  Appreciate GI recommendations.  Continue with IV PPI twice daily.  Patient reports occasional black stools at home    Acute diastolic heart failure Fluid overloaded,  BNP elevated,  Lasix 40 mg IV bid.  Strict intake she has diuresed about 6 L  since admission.  Renal parameters appear to be stable.  Continue IV Lasix today and change to oral Lasix tomorrow Appreciate cardiology recommendations.    Paroxysmal atrial fibrillation Rate controlled.  Echocardiogram showed left ventricular ejection fraction of 60 to 65% with moderate LVH. Her CHA2DS2-VASc score 4, she will need to be on anticoagulation which is being held for severe anemia and possibly GI bleed. Echocardiogram reviewed and discussed with the patient   Hypokalemia, hypomagnesemia Replaced, will check potassium and magnesium in the morning.   Mild thrombocytopenia of unclear etiology No obvious evidence of bleeding.   Hyperlipidemia continue with Lipitor    Covid 19 infection Patient is currently on room air, denies any shortness of breath or chest pain or cough. Complete the course of remdesivir.   Hypertension Suboptimally controlled blood pressure parameters, currently on Lasix 40 mg twice daily we will add hydralazine as needed.  Cardiology added Norvasc 2.5 mg daily.   DVT prophylaxis: (SCDs Code Status: Full code) Family Communication: (None at bedside disposition:   Status is: Inpatient  The patient will require care spanning > 2 midnights and should be moved to inpatient because: Ongoing diagnostic testing needed not appropriate for outpatient work up, Unsafe d/c plan, IV treatments appropriate due to intensity of illness or inability to take PO and Inpatient level of care appropriate due to severity of illness  Dispo: The patient is from: Home              Anticipated d/c is  to: Home              Patient currently is not medically stable to d/c.   Difficult to place patient No       Consultants:   Gastroenterology  Cardiology  Procedures: Echo Antimicrobials: None  Subjective: No chest pain or shortness of breath, reports feeling better no hematochezia or melena.  Objective: Vitals:    05/03/20 2008 05/04/20 0407 05/04/20 0559 05/04/20 1331  BP: (!) 168/80 (!) 162/75 (!) 176/82 (!) 156/76  Pulse: 78 70 76 67  Resp: _0 Temp: 97.8 F (36.6 C) 97.8 F (36.6 C)  98.3 F (36.8 C)  TempSrc: Oral Oral  Oral  SpO2: 97% 99%  95%  Weight:      Height:        Intake/Output Summary (Last 24 hours) at 05/04/2020 1414 Last data filed at 05/04/2020 0926 Gross per 24 hour  Intake 234.47 ml  Output 1100 ml  Net -865.53 ml   Filed Weights   05/01/20 1743  Weight: 79.4 kg    Examination:  General exam: Alert and comfortable not in any kind of distress Respiratory system: Air entry fair, patient remains on room air cardiovascular system: S1-S2 heard, regular rate rhythm, improving pedal edema Gastrointestinal system: Abdomen is soft, nontender bowel sounds normal Central nervous system: Alert and oriented, grossly nonfocal Extremities: Pedal edema is improving Skin no rashes seen  psychiatry: Mood is appropriate   Data Reviewed: I have personally reviewed following labs and imaging studies  CBC: Recent Labs  Lab 05/01/20 1500 05/01/20 1809 05/02/20 1116 05/03/20 0332 05/04/20 0358  WBC 3.5* 3.7* 6.2 8.0 7.7  NEUTROABS 2.4 2.3  --  6.0 5.8  HGB 5.6 Repeated and verified X2.* 5.4* 7.8* 7.6* 7.6*  HCT 19.4 Repeated and verified X2.* 20.6* 26.7* 26.2* 26.6*  MCV 64.3* 70.3* 72.8* 73.2* 73.9*  PLT 110.0* 112* 110* 101* 111*    Basic Metabolic Panel: Recent Labs  Lab 05/01/20 1809 05/01/20 2121 05/02/20 1116 05/02/20 2225 05/03/20 0332 05/03/20 1937 05/04/20 0358  NA 137  --  138 137 136  --  135  K 3.4*  --  2.7* 2.9* 2.9* 3.2* 3.5  CL 105  --  103 101 100  --  97*  CO2 21*  --  _1 --  29  GLUCOSE 105*  --  106* 159* 93  --  108*  BUN 11  --  8 6* 8  --  7*  CREATININE 0.59  --  0.45 0.61 0.44  --  0.58  CALCIUM 8.8*  --  8.5* 8.7* 8.4*  --  8.3*  MG  --  1.6* 1.4*  --  1.5*  --  1.9  PHOS  --  3.1  --   --  3.0  --  3.0     GFR: Estimated Creatinine Clearance: 51.1 mL/min (by C-G formula based on SCr of 0.58 mg/dL).  Liver Function Tests: Recent Labs  Lab 05/02/20 1116 05/04/20 0358  AST 24 20  ALT 18 16  ALKPHOS 110 97  BILITOT 2.5* 2.0*  PROT 6.2* 5.9*  ALBUMIN 3.3* 3.1*    CBG: No results for input(s): GLUCAP in the last 168 hours.   Recent Results (from the past 240 hour(s))  Resp Panel by RT-PCR (Flu A&B, Covid) Nasopharyngeal Swab     Status: Abnormal   Collection Time: 05/01/20  6:46 PM   Specimen: Nasopharyngeal Swab; Nasopharyngeal(NP) swabs in vial  transport medium  Result Value Ref Range Status   SARS Coronavirus 2 by RT PCR POSITIVE (A) NEGATIVE Final    Comment: RESULT CALLED TO, READ BACK BY AND VERIFIED WITH: SMITH J. 03.07.22 @ 2056 BY MECIAL J. (NOTE) SARS-CoV-2 target nucleic acids are DETECTED.  The SARS-CoV-2 RNA is generally detectable in upper respiratory specimens during the acute phase of infection. Positive results are indicative of the presence of the identified virus, but do not rule out bacterial infection or co-infection with other pathogens not detected by the test. Clinical correlation with patient history and other diagnostic information is necessary to determine patient infection status. The expected result is Negative.  Fact Sheet for Patients: EntrepreneurPulse.com.au  Fact Sheet for Healthcare Providers: IncredibleEmployment.be  This test is not yet approved or cleared by the [place] FDA and  has been authorized for detection and/or diagnosis of SARS-CoV-2 by FDA under an Emergency Use Authorization (EUA).  This EUA will remain in effect (meaning this test ca n be used) for the duration of  the COVID-19 declaration under Section 564(b)(1) of the Act, 21 U.S.C. section 360bbb-3(b)(1), unless the authorization is terminated or revoked sooner.     Influenza A by PCR NEGATIVE NEGATIVE Final   Influenza B  by PCR NEGATIVE NEGATIVE Final    Comment: (NOTE) The Xpert Xpress SARS-CoV-2/FLU/RSV plus assay is intended as an aid in the diagnosis of influenza from Nasopharyngeal swab specimens and should not be used as a sole basis for treatment. Nasal washings and aspirates are unacceptable for Xpert Xpress SARS-CoV-2/FLU/RSV testing.  Fact Sheet for Patients: EntrepreneurPulse.com.au  Fact Sheet for Healthcare Providers: IncredibleEmployment.be  This test is not yet approved or cleared by the [place] FDA and has been authorized for detection and/or diagnosis of SARS-CoV-2 by FDA under an Emergency Use Authorization (EUA). This EUA will remain in effect (meaning this test can be used) for the duration of the COVID-19 declaration under Section 564(b)(1) of the Act, 21 U.S.C. section 360bbb-3(b)(1), unless the authorization is terminated or revoked.  Performed at [facility], [place] [address] [place], [place] [address]          Radiology Studies: No results found.      Scheduled Meds: . amLODipine  2.5 mg Oral Daily  . atorvastatin  40 mg Oral Daily  . citalopram  10 mg Oral Daily  . feeding supplement  1 Container Oral TID BM  . [START ON 05/05/2020] furosemide  40 mg Oral Daily  . magnesium oxide  200 mg Oral Daily  . metoprolol succinate  25 mg Oral Q2000  . pantoprazole (PROTONIX) IV  40 mg Intravenous Q12H  . peg 3350 powder  0.5 kit Oral Once   And  . [START ON 05/05/2020] peg 3350 powder  0.5 kit Oral Once   Continuous Infusions:    LOS: 2 days       Hosie Poisson, MD [facility]   To contact the attending provider between 7A-7P or the covering provider during after hours 7P-7A, please log into the web site [email or web address] and access using universal [place] password for that web site. If you do not have the password, please call the hospital operator.  05/04/2020, 2:14 PM

## 2020-05-04 NOTE — Progress Notes (Signed)
Patient required a lot of encouragement to finish the 1st dose of bowel prep but stated she couldn't start on the second dose due too having " too much diarrhea and didn't have much too eat for a couple of days, therefore there was not much to clear. " Patient was educated but continues to decline to take the rest of her dose.

## 2020-05-05 ENCOUNTER — Inpatient Hospital Stay (HOSPITAL_COMMUNITY): Payer: Medicare Other | Admitting: Anesthesiology

## 2020-05-05 ENCOUNTER — Ambulatory Visit: Payer: Medicare Other | Admitting: Family Medicine

## 2020-05-05 ENCOUNTER — Encounter (HOSPITAL_COMMUNITY)
Admission: EM | Disposition: A | Discharge status: Skilled Nursing Facility | Payer: Self-pay | Source: Home / Self Care | Attending: Internal Medicine

## 2020-05-05 ENCOUNTER — Inpatient Hospital Stay (HOSPITAL_COMMUNITY): Payer: Medicare Other

## 2020-05-05 ENCOUNTER — Encounter (HOSPITAL_COMMUNITY): Payer: Self-pay | Admitting: Internal Medicine

## 2020-05-05 DIAGNOSIS — I482 Chronic atrial fibrillation, unspecified: Secondary | ICD-10-CM | POA: Diagnosis not present

## 2020-05-05 DIAGNOSIS — E782 Mixed hyperlipidemia: Secondary | ICD-10-CM | POA: Diagnosis not present

## 2020-05-05 DIAGNOSIS — K6389 Other specified diseases of intestine: Secondary | ICD-10-CM | POA: Diagnosis present

## 2020-05-05 DIAGNOSIS — K259 Gastric ulcer, unspecified as acute or chronic, without hemorrhage or perforation: Secondary | ICD-10-CM | POA: Diagnosis present

## 2020-05-05 DIAGNOSIS — I10 Essential (primary) hypertension: Secondary | ICD-10-CM | POA: Diagnosis not present

## 2020-05-05 DIAGNOSIS — D649 Anemia, unspecified: Secondary | ICD-10-CM | POA: Diagnosis not present

## 2020-05-05 DIAGNOSIS — D490 Neoplasm of unspecified behavior of digestive system: Secondary | ICD-10-CM

## 2020-05-05 HISTORY — PX: BIOPSY: SHX5522

## 2020-05-05 HISTORY — PX: COLONOSCOPY WITH PROPOFOL: SHX5780

## 2020-05-05 HISTORY — PX: ESOPHAGOGASTRODUODENOSCOPY (EGD) WITH PROPOFOL: SHX5813

## 2020-05-05 LAB — D-DIMER, QUANTITATIVE: D-Dimer, Quant: 2.21 ug/mL-FEU — ABNORMAL HIGH (ref 0.00–0.50)

## 2020-05-05 LAB — TYPE AND SCREEN
ABO/RH(D): O POS
Antibody Screen: NEGATIVE
Unit division: 0
Unit division: 0
Unit division: 0
Unit division: 0

## 2020-05-05 LAB — COMPREHENSIVE METABOLIC PANEL
ALT: 15 U/L (ref 0–44)
AST: 20 U/L (ref 15–41)
Albumin: 3 g/dL — ABNORMAL LOW (ref 3.5–5.0)
Alkaline Phosphatase: 93 U/L (ref 38–126)
Anion gap: 11 (ref 5–15)
BUN: 7 mg/dL — ABNORMAL LOW (ref 8–23)
CO2: 28 mmol/L (ref 22–32)
Calcium: 8.8 mg/dL — ABNORMAL LOW (ref 8.9–10.3)
Chloride: 99 mmol/L (ref 98–111)
Creatinine, Ser: 0.51 mg/dL (ref 0.44–1.00)
GFR, Estimated: >60 mL/min (ref >60)
Glucose, Bld: 109 mg/dL — ABNORMAL HIGH (ref 70–99)
Potassium: 3.3 mmol/L — ABNORMAL LOW (ref 3.5–5.1)
Sodium: 138 mmol/L (ref 135–145)
Total Bilirubin: 2.1 mg/dL — ABNORMAL HIGH (ref 0.3–1.2)
Total Protein: 6 g/dL — ABNORMAL LOW (ref 6.5–8.1)

## 2020-05-05 LAB — BPAM RBC
Blood Product Expiration Date: 202204022359
Blood Product Expiration Date: 202204022359
Blood Product Expiration Date: 202204022359
Blood Product Expiration Date: 202204022359
ISSUE DATE / TIME: 202203072359
ISSUE DATE / TIME: 202203080416
Unit Type and Rh: 5100
Unit Type and Rh: 5100
Unit Type and Rh: 5100
Unit Type and Rh: 5100

## 2020-05-05 LAB — CBC WITH DIFFERENTIAL/PLATELET
Abs Immature Granulocytes: 0.04 10*3/uL (ref 0.00–0.07)
Basophils Absolute: 0 10*3/uL (ref 0.0–0.1)
Basophils Relative: 0 %
Eosinophils Absolute: 0 10*3/uL (ref 0.0–0.5)
Eosinophils Relative: 1 %
HCT: 27.4 % — ABNORMAL LOW (ref 36.0–46.0)
Hemoglobin: 7.7 g/dL — ABNORMAL LOW (ref 12.0–15.0)
Immature Granulocytes: 1 %
Lymphocytes Relative: 11 %
Lymphs Abs: 0.8 10*3/uL (ref 0.7–4.0)
MCH: 21 pg — ABNORMAL LOW (ref 26.0–34.0)
MCHC: 28.1 g/dL — ABNORMAL LOW (ref 30.0–36.0)
MCV: 74.9 fL — ABNORMAL LOW (ref 80.0–100.0)
Monocytes Absolute: 0.8 10*3/uL (ref 0.1–1.0)
Monocytes Relative: 11 %
Neutro Abs: 5.6 10*3/uL (ref 1.7–7.7)
Neutrophils Relative %: 76 %
Platelets: 105 10*3/uL — ABNORMAL LOW (ref 150–400)
RBC: 3.66 MIL/uL — ABNORMAL LOW (ref 3.87–5.11)
RDW: 24.6 % — ABNORMAL HIGH (ref 11.5–15.5)
WBC: 7.3 10*3/uL (ref 4.0–10.5)
nRBC: 0 % (ref 0.0–0.2)

## 2020-05-05 LAB — PHOSPHORUS: Phosphorus: 3.8 mg/dL (ref 2.5–4.6)

## 2020-05-05 LAB — MAGNESIUM: Magnesium: 1.7 mg/dL (ref 1.7–2.4)

## 2020-05-05 LAB — FERRITIN: Ferritin: 11 ng/mL (ref 11–307)

## 2020-05-05 LAB — C-REACTIVE PROTEIN: CRP: 9.1 mg/dL — ABNORMAL HIGH (ref <1.0)

## 2020-05-05 SURGERY — ESOPHAGOGASTRODUODENOSCOPY (EGD) WITH PROPOFOL
Anesthesia: General

## 2020-05-05 MED ORDER — PROPOFOL 10 MG/ML IV BOLUS
INTRAVENOUS | Status: DC | PRN
  Administered 2020-05-05 (×8): 20 mg via INTRAVENOUS
  Administered 2020-05-05: 50 mg via INTRAVENOUS
  Administered 2020-05-05 (×3): 20 mg via INTRAVENOUS
  Administered 2020-05-05: 40 mg via INTRAVENOUS
  Administered 2020-05-05 (×7): 20 mg via INTRAVENOUS
  Administered 2020-05-05: 50 mg via INTRAVENOUS
  Administered 2020-05-05 (×2): 20 mg via INTRAVENOUS

## 2020-05-05 MED ORDER — AMLODIPINE BESYLATE 5 MG PO TABS
5.0000 mg | ORAL_TABLET | Freq: Every day | ORAL | Status: DC
  Administered 2020-05-05 – 2020-05-09 (×5): 5 mg via ORAL
  Filled 2020-05-05 (×5): qty 1

## 2020-05-05 MED ORDER — LIDOCAINE 2% (20 MG/ML) 5 ML SYRINGE
INTRAMUSCULAR | Status: DC | PRN
  Administered 2020-05-05 (×2): 40 mg via INTRAVENOUS

## 2020-05-05 MED ORDER — IOHEXOL 350 MG/ML SOLN
100.0000 mL | Freq: Once | INTRAVENOUS | Status: AC | PRN
  Administered 2020-05-05: 75 mL via INTRAVENOUS

## 2020-05-05 MED ORDER — POTASSIUM CHLORIDE CRYS ER 20 MEQ PO TBCR
60.0000 meq | EXTENDED_RELEASE_TABLET | Freq: Once | ORAL | Status: AC
  Administered 2020-05-05: 60 meq via ORAL
  Filled 2020-05-05: qty 3

## 2020-05-05 MED ORDER — LACTATED RINGERS IV SOLN
INTRAVENOUS | Status: AC | PRN
  Administered 2020-05-05: 10 mL/h via INTRAVENOUS

## 2020-05-05 MED ORDER — SUCRALFATE 1 GM/10ML PO SUSP
1.0000 g | Freq: Three times a day (TID) | ORAL | Status: DC
  Administered 2020-05-05 – 2020-05-12 (×28): 1 g via ORAL
  Filled 2020-05-05 (×25): qty 10

## 2020-05-05 SURGICAL SUPPLY — 24 items

## 2020-05-05 NOTE — Progress Notes (Signed)
PROGRESS NOTE    Linda Glass  ZDG:[id number] DOB: 1933-08-03 DOA: 05/01/2020 PCP: [name], [name], MD    Chief Complaint  Patient presents with  . abnormal labs    Brief Narrative: 87 year old lady with history of hypertension, osteoarthritis, hyperlipidemia, presented to ED for anemia, generalized weakness and fatigue.  On arrival to ED she was found to have a hemoglobin of 5.4, platelets of 112000.  She underwent 10 units of PRBC transfusion and her repeat hemoglobin is 7.8.  She was also diagnosed recently with new onset A. Fib.  Echocardiogram ordered, showed left ventricular ejection fraction of 60 to 65% with moderate LVH, moderately elevated pulmonary pressure.  She was started on IV Lasix 40 mg twice daily by cardiology.  GI consulted for evaluation of severe anemia. She is scheduled for EGD and colonoscopy today.  Diuresed about 6.7 lit since admission. Transition to oral lasix today.   Assessment & Plan:   Principal Problem:   Symptomatic anemia Active Problems:   Class 1 obesity   Hyperlipidemia, mixed   Hypertension, essential, benign   Anxiety disorder   Chronic atrial fibrillation (HCC)   Hypokalemia   2019 novel coronavirus disease (COVID-19)   Pancytopenia (HCC)   Paroxysmal atrial fibrillation (HCC)   Melena   Severe anemia/severe iron deficiency anemia Stool occult blood is negative, admitted with a hemoglobin of 5.4, underwent 2 units of PRBC transfusion and hemoglobin has improved to 7.8, hemoglobin stable at 7.6.  Ferritin levels at 6, 9.  She will probably need iron infusion prior to discharge.  She is scheduled for EGD and colonoscopy today. No new complaints. No melanotic stools or hematochezia. Marland Kitchen  Appreciate GI recommendations.  Continue with IV PPI twice daily.      Acute diastolic heart failure Fluid overloaded,  BNP elevated,  Lasix 40 mg IV bid. Strict intake she has diuresed about 6.7 L  since admission.  Renal parameters appear  to be stable.  Transition to oral lasix today.  Appreciate cardiology recommendations.    Paroxysmal atrial fibrillation Rate controlled.  Echocardiogram showed left ventricular ejection fraction of 60 to 65% with moderate LVH. Her CHA2DS2-VASc score 4, she will need to be on anticoagulation which is being held for severe anemia and possibly GI bleed. Echocardiogram reviewed and discussed with the patient   Hypokalemia, hypomagnesemia Replaced.    Mild thrombocytopenia of unclear etiology No obvious evidence of bleeding.   Hyperlipidemia continue with Lipitor    Covid 19 infection Patient is currently on room air, denies any shortness of breath or chest pain or cough. Completed the course of remdesivir.   Hypertension Better controlled today on norvasc .    DVT prophylaxis: (SCDs Code Status: Full code) Family Communication: (None at bedside disposition:   Status is: Inpatient  The patient will require care spanning > 2 midnights and should be moved to inpatient because: Ongoing diagnostic testing needed not appropriate for outpatient work up, Unsafe d/c plan, IV treatments appropriate due to intensity of illness or inability to take PO and Inpatient level of care appropriate due to severity of illness  Dispo: The patient is from: Home              Anticipated d/c is to: Home              Patient currently is not medically stable to d/c.   Difficult to place patient No       Consultants:   Gastroenterology  Cardiology  Procedures: Echo  Antimicrobials: None  Subjective: No chest pain or sob. On RA.   Objective: Vitals:   05/04/20 1331 05/04/20 2010 05/05/20 0632 05/05/20 1315  BP: (!) 156/76 (!) 159/69 (!) 153/60 (!) 165/74  Pulse: 67 80 61 79  Resp: 16 20  20   Temp: 98.3 F (36.8 C) (!) 97.5 F (36.4 C) 98.7 F (37.1 C) (!) 97.5 F (36.4 C)  TempSrc: Oral Oral Oral Oral  SpO2: 95% 99% 92% 97%  Weight:    79.4 kg  Height:    5\' 2"  (1.575 m)     Intake/Output Summary (Last 24 hours) at 05/05/2020 1319 Last data filed at 05/04/2020 1831 Gross per 24 hour  Intake 120 ml  Output 600 ml  Net -480 ml   Filed Weights   05/01/20 1743 05/05/20 1315  Weight: 79.4 kg 79.4 kg    Examination:  General exam: ALERT AND comfortable.  Respiratory system: air entry fair bilateral. No wheezing heard. On RA.  cardiovascular system: S1-S2 heard, Irregular, no JVD,  Gastrointestinal system: Abdomen is soft, NT ND BS+ Central nervous system: alert and oriented, nonfocal.  Extremities: pedal edema improving.  Skin no rashes seen  psychiatry: Mood appropriate.    Data Reviewed: I have personally reviewed following labs and imaging studies  CBC: Recent Labs  Lab 05/01/20 1500 05/01/20 1809 05/02/20 1116 05/03/20 0332 05/04/20 0358 05/05/20 0351  WBC 3.5* 3.7* 6.2 8.0 7.7 7.3  NEUTROABS 2.4 2.3  --  6.0 5.8 5.6  HGB 5.6 Repeated and verified X2.* 5.4* 7.8* 7.6* 7.6* 7.7*  HCT 19.4 Repeated and verified X2.* 20.6* 26.7* 26.2* 26.6* 27.4*  MCV 64.3* 70.3* 72.8* 73.2* 73.9* 74.9*  PLT 110.0* 112* 110* 101* 111* 105*    Basic Metabolic Panel: Recent Labs  Lab 05/01/20 2121 05/02/20 1116 05/02/20 2225 05/03/20 0332 05/03/20 1937 05/04/20 0358 05/05/20 0351  NA  --  138 137 136  --  135 138  K  --  2.7* 2.9* 2.9* 3.2* 3.5 3.3*  CL  --  103 101 100  --  97* 99  CO2  --  23 24 25   --  29 28  GLUCOSE  --  106* 159* 93  --  108* 109*  BUN  --  8 6* 8  --  7* 7*  CREATININE  --  0.45 0.61 0.44  --  0.58 0.51  CALCIUM  --  8.5* 8.7* 8.4*  --  8.3* 8.8*  MG 1.6* 1.4*  --  1.5*  --  1.9 1.7  PHOS 3.1  --   --  3.0  --  3.0 3.8    GFR: Estimated Creatinine Clearance: 51.1 mL/min (by C-G formula based on SCr of 0.51 mg/dL).  Liver Function Tests: Recent Labs  Lab 05/02/20 1116 05/04/20 0358 05/05/20 0351  AST 24 20 20   ALT 18 16 15   ALKPHOS 110 97 93  BILITOT 2.5* 2.0* 2.1*  PROT 6.2* 5.9* 6.0*  ALBUMIN 3.3* 3.1*  3.0*    CBG: No results for input(s): GLUCAP in the last 168 hours.   Recent Results (from the past 240 hour(s))  Resp Panel by RT-PCR (Flu A&B, Covid) Nasopharyngeal Swab     Status: Abnormal   Collection Time: 05/01/20  6:46 PM   Specimen: Nasopharyngeal Swab; Nasopharyngeal(NP) swabs in vial transport medium  Result Value Ref Range Status   SARS Coronavirus 2 by RT PCR POSITIVE (A) NEGATIVE Final    Comment: RESULT CALLED TO, READ BACK BY AND VERIFIED  WITH: Bethann Humble. 03.07.22 @ 2056 BY MECIAL J. (NOTE) SARS-CoV-2 target nucleic acids are DETECTED.  The SARS-CoV-2 RNA is generally detectable in upper respiratory specimens during the acute phase of infection. Positive results are indicative of the presence of the identified virus, but do not rule out bacterial infection or co-infection with other pathogens not detected by the test. Clinical correlation with patient history and other diagnostic information is necessary to determine patient infection status. The expected result is Negative.  Fact Sheet for Patients: EntrepreneurPulse.com.au  Fact Sheet for Healthcare Providers: IncredibleEmployment.be  This test is not yet approved or cleared by the [place] FDA and  has been authorized for detection and/or diagnosis of SARS-CoV-2 by FDA under an Emergency Use Authorization (EUA).  This EUA will remain in effect (meaning this test ca n be used) for the duration of  the COVID-19 declaration under Section 564(b)(1) of the Act, 21 U.S.C. section 360bbb-3(b)(1), unless the authorization is terminated or revoked sooner.     Influenza A by PCR NEGATIVE NEGATIVE Final   Influenza B by PCR NEGATIVE NEGATIVE Final    Comment: (NOTE) The Xpert Xpress SARS-CoV-2/FLU/RSV plus assay is intended as an aid in the diagnosis of influenza from Nasopharyngeal swab specimens and should not be used as a sole basis for treatment. Nasal washings  and aspirates are unacceptable for Xpert Xpress SARS-CoV-2/FLU/RSV testing.  Fact Sheet for Patients: EntrepreneurPulse.com.au  Fact Sheet for Healthcare Providers: IncredibleEmployment.be  This test is not yet approved or cleared by the [place] FDA and has been authorized for detection and/or diagnosis of SARS-CoV-2 by FDA under an Emergency Use Authorization (EUA). This EUA will remain in effect (meaning this test can be used) for the duration of the COVID-19 declaration under Section 564(b)(1) of the Act, 21 U.S.C. section 360bbb-3(b)(1), unless the authorization is terminated or revoked.  Performed at [facility], [place] [address], [place], [place] [address]          Radiology Studies: No results found.      Scheduled Meds: . [MAR Hold] amLODipine  5 mg Oral Daily  . [MAR Hold] atorvastatin  40 mg Oral Daily  . [MAR Hold] citalopram  10 mg Oral Daily  . [MAR Hold] feeding supplement  1 Container Oral TID BM  . [MAR Hold] furosemide  40 mg Oral Daily  . [MAR Hold] magnesium oxide  200 mg Oral Daily  . [MAR Hold] metoprolol succinate  25 mg Oral Q2000  . [MAR Hold] pantoprazole (PROTONIX) IV  40 mg Intravenous Q12H   Continuous Infusions:    LOS: 3 days       Hosie Poisson, MD [facility]   To contact the attending provider between 7A-7P or the covering provider during after hours 7P-7A, please log into the web site [email or web address] and access using universal [place] password for that web site. If you do not have the password, please call the hospital operator.  05/05/2020, 1:19 PM

## 2020-05-05 NOTE — Progress Notes (Signed)
GI follow up made with me on 06/05/20 at 10am.

## 2020-05-05 NOTE — Anesthesia Preprocedure Evaluation (Addendum)
Anesthesia Evaluation  Patient identified by MRN, date of birth, ID band Patient awake    Reviewed: Allergy & Precautions, NPO status , Patient's Chart, lab work & pertinent test results, reviewed documented beta blocker date and time   History of Anesthesia Complications Negative for: history of anesthetic complications  Airway Mallampati: III  TM Distance: >3 FB Neck ROM: Limited    Dental  (+) Dental Advisory Given, Poor Dentition, Chipped, Missing   Pulmonary neg pulmonary ROS,    Pulmonary exam normal        Cardiovascular hypertension, Pt. on medications and Pt. on home beta blockers + dysrhythmias Atrial Fibrillation  Rhythm:Irregular Rate:Normal   '22 TTE - EF 60 to 65%. Moderate left ventricular hypertrophy. Moderately elevated pulmonary artery systolic pressure. The estimated right ventricular systolic pressure is 94.7 mmHg. LA and RA were mildly dilated. Mild MR.     Neuro/Psych PSYCHIATRIC DISORDERS Anxiety negative neurological ROS     GI/Hepatic negative GI ROS, Neg liver ROS,   Endo/Other   Obesity K 3.3   Renal/GU negative Renal ROS     Musculoskeletal  (+) Arthritis ,   Abdominal   Peds  Hematology  (+) anemia ,  On eliquis Thrombocytopenia, Plt 105k    Anesthesia Other Findings Covid+ 05/01/20   Reproductive/Obstetrics                            Anesthesia Physical Anesthesia Plan  ASA: III  Anesthesia Plan: MAC   Post-op Pain Management:    Induction: Intravenous  PONV Risk Score and Plan: 2 and Treatment may vary due to age or medical condition and Propofol infusion  Airway Management Planned: Nasal Cannula and Natural Airway  Additional Equipment: None  Intra-op Plan:   Post-operative Plan:   Informed Consent: I have reviewed the patients History and Physical, chart, labs and discussed the procedure including the risks, benefits and alternatives for  the proposed anesthesia with the patient or authorized representative who has indicated his/her understanding and acceptance.       Plan Discussed with: CRNA and Anesthesiologist  Anesthesia Plan Comments:        Anesthesia Quick Evaluation

## 2020-05-05 NOTE — Anesthesia Postprocedure Evaluation (Signed)
Anesthesia Post Note  Patient: Linda Glass  Procedure(s) Performed: ESOPHAGOGASTRODUODENOSCOPY (EGD) WITH PROPOFOL (N/A ) COLONOSCOPY WITH PROPOFOL (N/A ) BIOPSY     Patient location during evaluation: PACU Anesthesia Type: MAC Level of consciousness: awake and alert Pain management: pain level controlled Vital Signs Assessment: post-procedure vital signs reviewed and stable Respiratory status: spontaneous breathing, nonlabored ventilation and respiratory function stable Cardiovascular status: stable and blood pressure returned to baseline Anesthetic complications: no   No complications documented.  Last Vitals:  Vitals:   05/05/20 1505 05/05/20 1510  BP: (!) 145/62   Pulse: 68 62  Resp: 19 18  Temp:    SpO2: 95% 94%    Last Pain:  Vitals:   05/05/20 1447  TempSrc: Oral  PainSc:                  Audry Pili

## 2020-05-05 NOTE — Care Management Important Message (Signed)
Important Message  Patient Details IM Letter placed in Patient's door caddy. Name: Linda Glass MRN: [id number] Date of Birth: April 13, 1933   Medicare Important Message Given:  Yes     Kerin Salen 05/05/2020, 10:37 AM

## 2020-05-05 NOTE — Op Note (Addendum)
[facility] Patient Name: Linda Glass Procedure Date: 05/05/2020 MRN: [id number] Attending MD: Mauri Pole , MD Date of Birth: 03-11-1933 CSN: [id number] Age: 87 Admit Type: Inpatient Procedure:                Colonoscopy Indications:              Unexplained iron deficiency anemia Providers:                Mauri Pole, MD, Elmer Ramp. Hinson, RN, Janie                            Billups, Technician, Glenis Smoker, CRNA Referring MD:              Medicines:                Monitored Anesthesia Care Complications:            No immediate complications. Estimated Blood Loss:     Estimated blood loss was minimal. Procedure:                Pre-Anesthesia Assessment:                           - Prior to the procedure, a History and Physical                            was performed, and patient medications and                            allergies were reviewed. The patient's tolerance of                            previous anesthesia was also reviewed. The risks                            and benefits of the procedure and the sedation                            options and risks were discussed with the patient.                            All questions were answered, and informed consent                            was obtained. Prior Anticoagulants: The patient has                            taken no previous anticoagulant or antiplatelet                            agents. ASA Grade Assessment: III - A patient with                            severe systemic disease. After reviewing the risks  and benefits, the patient was deemed in                            satisfactory condition to undergo the procedure.                           After obtaining informed consent, the colonoscope                            was passed under direct vision. Throughout the                            procedure, the patient's blood pressure, pulse, and                             oxygen saturations were monitored continuously. The                            [id number]-[id number] ([id number] Olympus pediatric colonscope                            was introduced through the anus and advanced to the                            the cecum, identified by appendiceal orifice and                            ileocecal valve. The colonoscopy was performed                            without difficulty. The patient tolerated the                            procedure well. The quality of the bowel                            preparation was adequate. The ileocecal valve,                            appendiceal orifice, and rectum were photographed. Scope In: 1:59:17 PM Scope Out: 2:32:41 PM Scope Withdrawal Time: 0 hours 23 minutes 53 seconds  Total Procedure Duration: 0 hours 33 minutes 24 seconds  Findings:      The perianal and digital rectal examinations were normal.      A polypoid large mass was found at the ileocecal valve. The mass       measured five cm in length. No bleeding was present. Biopsies were taken       with a cold forceps for histology.      Multiple small and large-mouthed diverticula were found in the sigmoid       colon and descending colon.      Non-bleeding internal hemorrhoids were found during retroflexion. The       hemorrhoids were small. Impression:               - Rule out malignancy, tumor at the  ileocecal                            valve. Biopsied.                           - Diverticulosis in the sigmoid colon and in the                            descending colon.                           - Non-bleeding internal hemorrhoids. Moderate Sedation:      Not Applicable - Patient had care per Anesthesia.      N/A Recommendation:           - Patient has a contact number available for                            emergencies. The signs and symptoms of potential                            delayed complications were discussed with the                             patient. Return to normal activities tomorrow.                            Written discharge instructions were provided to the                            patient.                           - Resume previous diet.                           - Continue present medications.                           - Await pathology results. Will contact patient                            with biopsy results                           - Repeat colonoscopy date to be determined after                            pending pathology results are reviewed for                            surveillance based on pathology results.                           - See the other procedure note for documentation of  additional recommendations. Procedure Code(s):        --- Professional ---                           [phone], Colonoscopy, flexible; with biopsy, single                            or multiple Diagnosis Code(s):        --- Professional ---                           D49.0, Neoplasm of unspecified behavior of                            digestive system                           K64.8, Other hemorrhoids                           D50.9, Iron deficiency anemia, unspecified                           K57.30, Diverticulosis of large intestine without                            perforation or abscess without bleeding CPT copyright 2019 American Medical Association. All rights reserved. The codes documented in this report are preliminary and upon coder review may  be revised to meet current compliance requirements. Mauri Pole, MD 05/05/2020 3:01:21 PM This report has been signed electronically. Number of Addenda: 0

## 2020-05-05 NOTE — Interval H&P Note (Signed)
History and Physical Interval Note:  05/05/2020 1:45 PM  Linda Glass  has presented today for surgery, with the diagnosis of iron deficiency anemia.  The various methods of treatment have been discussed with the patient and family. After consideration of risks, benefits and other options for treatment, the patient has consented to  Procedure(s): ESOPHAGOGASTRODUODENOSCOPY (EGD) WITH PROPOFOL (N/A) COLONOSCOPY WITH PROPOFOL (N/A) as a surgical intervention.  The patient's history has been reviewed, patient examined, no change in status, stable for surgery.  I have reviewed the patient's chart and labs.  Questions were answered to the patient's satisfaction.     Jahkari Maclin

## 2020-05-05 NOTE — Progress Notes (Signed)
Progress Note  Patient Name: Linda Glass Date of Encounter: 05/05/2020  [facility] Cardiologist: No primary care provider on file.   Subjective   Feeling well.  Waiting for EGD.  Inpatient Medications    Scheduled Meds: . amLODipine  2.5 mg Oral Daily  . atorvastatin  40 mg Oral Daily  . citalopram  10 mg Oral Daily  . feeding supplement  1 Container Oral TID BM  . furosemide  40 mg Oral Daily  . magnesium oxide  200 mg Oral Daily  . metoprolol succinate  25 mg Oral Q2000  . pantoprazole (PROTONIX) IV  40 mg Intravenous Q12H   Continuous Infusions:  PRN Meds: acetaminophen **OR** acetaminophen, chlorpheniramine-HYDROcodone, guaiFENesin-dextromethorphan, hydrALAZINE, LORazepam, ondansetron **OR** ondansetron (ZOFRAN) IV   Vital Signs    Vitals:   05/04/20 0559 05/04/20 1331 05/04/20 2010 05/05/20 0632  BP: (!) 176/82 (!) 156/76 (!) 159/69 (!) 153/60  Pulse: 76 67 80 61  Resp:  16 20   Temp:  98.3 F (36.8 C) (!) 97.5 F (36.4 C) 98.7 F (37.1 C)  TempSrc:  Oral Oral Oral  SpO2:  95% 99% 92%  Weight:      Height:        Intake/Output Summary (Last 24 hours) at 05/05/2020 0816 Last data filed at 05/04/2020 1831 Gross per 24 hour  Intake 120 ml  Output 850 ml  Net -730 ml   Last 3 Weights 05/01/2020 05/01/2020 02/11/2020  Weight (lbs) 175 lb (No Data) 180 lb 3.2 oz  Weight (kg) 79.379 kg (No Data) 81.738 kg      Telemetry    Sinus rhythm. 4 beats NSVT - Personally Reviewed  ECG    No new tracings - Personally Reviewed  Physical Exam   VS:  BP (!) 153/60 (BP Location: Left Arm)   Pulse 61   Temp 98.7 F (37.1 C) (Oral)   Resp 20   Ht 5\' 2"  (1.575 m)   Wt 79.4 kg   SpO2 92%   BMI 32.01 kg/m  , BMI Body mass index is 32.01 kg/m. GENERAL:  Well appearing HEENT: Pupils equal round and reactive, fundi not visualized, oral mucosa unremarkable NECK:  No jugular venous distention, waveform within normal limits, carotid upstroke brisk  and symmetric, no bruits, no thyromegaly LYMPHATICS:  No cervical adenopathy LUNGS:  Clear to auscultation bilaterally HEART:  RRR.  PMI not displaced or sustained,S1 and S2 within normal limits, no S3, no S4, no clicks, no rubs, II/VI systolic murmur ABD:  Flat, positive bowel sounds normal in frequency in pitch, no bruits, no rebound, no guarding, no midline pulsatile mass, no hepatomegaly, no splenomegaly EXT:  2 plus pulses throughout, no edema, no cyanosis no clubbing SKIN:  No rashes no nodules NEURO:  Cranial nerves II through XII grossly intact, motor grossly intact throughout PSYCH:  Cognitively intact, oriented to person place and time   Labs    High Sensitivity Troponin:  No results for input(s): TROPONINIHS in the last 720 hours.    Chemistry Recent Labs  Lab 05/02/20 1116 05/02/20 2225 05/03/20 0332 05/03/20 1937 05/04/20 0358 05/05/20 0351  NA 138   < > 136  --  135 138  K 2.7*   < > 2.9* 3.2* 3.5 3.3*  CL 103   < > 100  --  97* 99  CO2 23   < > 25  --  29 28  GLUCOSE 106*   < > 93  --  108* 109*  BUN 8   < > 8  --  7* 7*  CREATININE 0.45   < > 0.44  --  0.58 0.51  CALCIUM 8.5*   < > 8.4*  --  8.3* 8.8*  PROT 6.2*  --   --   --  5.9* 6.0*  ALBUMIN 3.3*  --   --   --  3.1* 3.0*  AST 24  --   --   --  20 20  ALT 18  --   --   --  16 15  ALKPHOS 110  --   --   --  97 93  BILITOT 2.5*  --   --   --  2.0* 2.1*  GFRNONAA >60   < > >60  --  >60 >60  ANIONGAP 12   < > 11  --  9 11   < > = values in this interval not displayed.     Hematology Recent Labs  Lab 05/03/20 0332 05/04/20 0358 05/05/20 0351  WBC 8.0 7.7 7.3  RBC 3.58* 3.60* 3.66*  HGB 7.6* 7.6* 7.7*  HCT 26.2* 26.6* 27.4*  MCV 73.2* 73.9* 74.9*  MCH 21.2* 21.1* 21.0*  MCHC 29.0* 28.6* 28.1*  RDW 22.8* 23.8* 24.6*  PLT 101* 111* 105*    BNP Recent Labs  Lab 05/01/20 1500 05/02/20 1116  BNP  --  596.4*  PROBNP 599.0*  --      DDimer  Recent Labs  Lab 05/03/20 0332 05/04/20 0358  05/05/20 0351  DDIMER 2.15* 2.35* 2.21*     Radiology    No results found.  Cardiac Studies   Echo 05/02/2020: 1. Left ventricular ejection fraction, by estimation, is 60 to 65%. The  left ventricle has normal function. The left ventricle has no regional  wall motion abnormalities. There is moderate left ventricular hypertrophy.  Left ventricular diastolic function  could not be evaluated.  2. Right ventricular systolic function is normal. The right ventricular  size is normal. There is moderately elevated pulmonary artery systolic  pressure. The estimated right ventricular systolic pressure is 53.9 mmHg.  3. Left atrial size was mildly dilated.  4. Right atrial size was mildly dilated.  5. The mitral valve is normal in structure. Mild mitral valve  regurgitation.  6. The aortic valve is calcified. There is mild calcification of the  aortic valve. There is mild thickening of the aortic valve. Aortic valve  regurgitation is not visualized. No aortic stenosis is present.  7. The inferior vena cava is dilated in size with <50% respiratory  variability, suggesting right atrial pressure of 15 mmHg.   Patient Profile     87 y.o. female with a PMH of HTN, presented with SOB in the setting of acute onset anemia (Hgb 5.4), new onset atrial fibrillation, acute diastolic CHF, and JQBHA-19 infection.  Assessment & Plan    1. New onset atrial fibrillation: new onset this admission in the setting of COVID-19 infection and symptomatic anemia. Hgb 5.4 on admission, now s/p 2 uPRBC with Hgb stable at 7.7. TSH wnl. Echo with EF 60-65%, mild biatrial enlargement, and mild MR. She was started on metoprolol  succinate and HR's have been stable though she remains in atrial fibrillation. Anticoagulation not started despite CHA2DS2-VASc Score = 4 ( [CHF History: No, HTN History: Yes, Diabetes History: No, Stroke History: No, Vascular Disease History: No, Age Score: 2, Gender Score: 1].  Therefore,  the patient's annual risk of stroke is 4.8 %. ) due to  her ongoing anemia and need for transfusions this admission.  - Continue metoprolol for rate control - Continue to monitor Hgb closely - anticipating EGD/C-Scope today to evaluate etiology of anemia. If source of bleeding identified, Hgb improves, and GI clears her to start anticoagulation, would consider eliquis 5mg  BID for stroke ppx. She does not meet criteria for reduced dosing.   2. Acute diastolic CHF: felt to be volume overloaded on admission. Echo with EF 60-65%, moderate LVH, indeterminate LV diastolic function, elevated RA pressures, and mild MR. She was started on IV lasix with UOP noted to be -6.7L this admission, though doubt intake is accurate and she has had several unmeasured UOP occurrences. No weight trend. She was transitioned to po lasix 40mg  daily to start today given improvement in symptoms.  - Continue metoprolol succinate as above - Continue po lasix 40mg  daily  - Continue to monitor strict I&Os and daily weights - Continue to monitor electrolytes closely and replete as needed to maintain K >4, Mg >2 - will give K 60 mEq po this morning   3. HTN: remains hypertensive with SBP generally in the 150s. Started on amlodipine 2.5mg  daily 05/04/20.  - Increase amlodipine to 5mg   4. Acute blood loss anemia: Hgb 5.4 on admission, suspicious for GI bleed. She received 2 uPRBC with improvement in Hgb to 7.7. Anticipating EGD/C-Scope today per GI.  - Continue management per GI and primary team  5. HLD: LDL 36 01/2020 - Continue atorvastatin  6. COVID-19 infection: receiving remdesivir this admission. Satting well on RA. - Continue management pr primary team      For questions or updates, please contact [place] Please consult [email or web address] for contact info under        Signed, Abigail Butts, PA-C  05/05/2020, 8:16 AM

## 2020-05-05 NOTE — Op Note (Addendum)
[facility] Patient Name: Linda Glass Procedure Date: 05/05/2020 MRN: [id number] Attending MD: Mauri Pole , MD Date of Birth: 1933-07-15 CSN: [id number] Age: 87 Admit Type: Inpatient Procedure:                Upper GI endoscopy Indications:              Gastrointestinal bleeding of unknown origin Providers:                Mauri Pole, MD, Elmer Ramp. Hinson, RN, Janie                            Billups, Technician, Glenis Smoker, CRNA Referring MD:              Medicines:                Monitored Anesthesia Care Complications:            No immediate complications. Estimated Blood Loss:     Estimated blood loss was minimal. Procedure:                Pre-Anesthesia Assessment:                           - Prior to the procedure, a History and Physical                            was performed, and patient medications and                            allergies were reviewed. The patient's tolerance of                            previous anesthesia was also reviewed. The risks                            and benefits of the procedure and the sedation                            options and risks were discussed with the patient.                            All questions were answered, and informed consent                            was obtained. Prior Anticoagulants: The patient has                            taken no previous anticoagulant or antiplatelet                            agents. ASA Grade Assessment: III - A patient with                            severe systemic disease. After reviewing the risks  and benefits, the patient was deemed in                            satisfactory condition to undergo the procedure.                           After obtaining informed consent, the endoscope was                            passed under direct vision. Throughout the                            procedure, the patient's blood  pressure, pulse, and                            oxygen saturations were monitored continuously. The                            [id number]-[id number] ([id number]) was introduced through the                            mouth, and advanced to the second part of duodenum.                            The upper GI endoscopy was accomplished without                            difficulty. The patient tolerated the procedure                            well. Scope In: Scope Out: Findings:      The Z-line was regular and was found 37 cm from the incisors.      The gastroesophageal flap valve was visualized endoscopically and       classified as Hill Grade IV (no fold, wide open lumen, hiatal hernia       present).      A 5 cm hiatal hernia was present.      Few non-bleeding cratered gastric ulcers with a clean ulcer base       (Forrest Class III) were found in the prepyloric region of the stomach       and at the pylorus. The largest lesion was 10 mm in largest dimension.       Biopsies were taken with a cold forceps for histology. Biopsies were       taken with a cold forceps for Helicobacter pylori testing.      The examined duodenum was normal. Impression:               - Z-line regular, 37 cm from the incisors.                           - Gastroesophageal flap valve classified as Hill                            Grade IV (no fold, wide open lumen, hiatal hernia  present).                           - 5 cm hiatal hernia.                           - Non-bleeding gastric ulcers with a clean ulcer                            base (Forrest Class III). Biopsied.                           - Normal examined duodenum. Moderate Sedation:      Not Applicable - Patient had care per Anesthesia. Recommendation:           - Patient has a contact number available for                            emergencies. The signs and symptoms of potential                            delayed complications were  discussed with the                            patient. Return to normal activities tomorrow.                            Written discharge instructions were provided to the                            patient.                           - Resume previous diet.                           - Continue present medications.                           - Await pathology results.                           - Protonix 40mg  BID X 3 months followed by once                            daily                           - Avoid NSAID's                           - Carafate 1gm before meals and at bedtime X4 weeks                           - Follow up in GI office in 6-8 weeks                           -  GI will sign off, available if have any questions Procedure Code(s):        --- Professional ---                           [phone], Esophagogastroduodenoscopy, flexible,                            transoral; with biopsy, single or multiple Diagnosis Code(s):        --- Professional ---                           K44.9, Diaphragmatic hernia without obstruction or                            gangrene                           K25.9, Gastric ulcer, unspecified as acute or                            chronic, without hemorrhage or perforation                           K92.2, Gastrointestinal hemorrhage, unspecified CPT copyright 2019 [employer]. All rights reserved. The codes documented in this report are preliminary and upon coder review may  be revised to meet current compliance requirements. Mauri Pole, MD 05/05/2020 2:49:27 PM This report has been signed electronically. Number of Addenda: 0

## 2020-05-05 NOTE — Anesthesia Procedure Notes (Signed)
Procedure Name: MAC Date/Time: 05/05/2020 1:31 PM Performed by: Cynda Familia, CRNA Pre-anesthesia Checklist: Patient identified, Emergency Drugs available, Suction available, Patient being monitored and Timeout performed Patient Re-evaluated:Patient Re-evaluated prior to induction Oxygen Delivery Method: Simple face mask Placement Confirmation: positive ETCO2 and breath sounds checked- equal and bilateral Dental Injury: Teeth and Oropharynx as per pre-operative assessment

## 2020-05-05 NOTE — Transfer of Care (Signed)
Immediate Anesthesia Transfer of Care Note  Patient: Linda Glass  Procedure(s) Performed: ESOPHAGOGASTRODUODENOSCOPY (EGD) WITH PROPOFOL (N/A ) COLONOSCOPY WITH PROPOFOL (N/A ) BIOPSY  Patient Location: PACU and Endoscopy Unit  Anesthesia Type:MAC  Level of Consciousness: awake and alert   Airway & Oxygen Therapy: Patient Spontanous Breathing and Patient connected to face mask oxygen  Post-op Assessment: Report given to RN and Post -op Vital signs reviewed and stable  Post vital signs: Reviewed and stable  Last Vitals:  Vitals Value Taken Time  BP    Temp    Pulse    Resp    SpO2      Last Pain:  Vitals:   05/05/20 1315  TempSrc: Oral  PainSc: 0-No pain         Complications: No complications documented.

## 2020-05-06 LAB — CBC WITH DIFFERENTIAL/PLATELET
Abs Immature Granulocytes: 0.03 10*3/uL (ref 0.00–0.07)
Basophils Absolute: 0 10*3/uL (ref 0.0–0.1)
Basophils Relative: 0 %
Eosinophils Absolute: 0.2 10*3/uL (ref 0.0–0.5)
Eosinophils Relative: 3 %
HCT: 28 % — ABNORMAL LOW (ref 36.0–46.0)
Hemoglobin: 7.7 g/dL — ABNORMAL LOW (ref 12.0–15.0)
Immature Granulocytes: 1 %
Lymphocytes Relative: 16 %
Lymphs Abs: 0.9 10*3/uL (ref 0.7–4.0)
MCH: 21.2 pg — ABNORMAL LOW (ref 26.0–34.0)
MCHC: 27.5 g/dL — ABNORMAL LOW (ref 30.0–36.0)
MCV: 76.9 fL — ABNORMAL LOW (ref 80.0–100.0)
Monocytes Absolute: 0.6 10*3/uL (ref 0.1–1.0)
Monocytes Relative: 10 %
Neutro Abs: 3.9 10*3/uL (ref 1.7–7.7)
Neutrophils Relative %: 70 %
Platelets: 107 10*3/uL — ABNORMAL LOW (ref 150–400)
RBC: 3.64 MIL/uL — ABNORMAL LOW (ref 3.87–5.11)
RDW: 24.9 % — ABNORMAL HIGH (ref 11.5–15.5)
WBC: 5.6 10*3/uL (ref 4.0–10.5)
nRBC: 0 % (ref 0.0–0.2)

## 2020-05-06 LAB — C-REACTIVE PROTEIN: CRP: 10.7 mg/dL — ABNORMAL HIGH

## 2020-05-06 LAB — COMPREHENSIVE METABOLIC PANEL
ALT: 14 U/L (ref 0–44)
AST: 19 U/L (ref 15–41)
Albumin: 2.8 g/dL — ABNORMAL LOW (ref 3.5–5.0)
Alkaline Phosphatase: 87 U/L (ref 38–126)
Anion gap: 11 (ref 5–15)
BUN: 8 mg/dL (ref 8–23)
CO2: 26 mmol/L (ref 22–32)
Calcium: 8.8 mg/dL — ABNORMAL LOW (ref 8.9–10.3)
Chloride: 102 mmol/L (ref 98–111)
Creatinine, Ser: 0.46 mg/dL (ref 0.44–1.00)
GFR, Estimated: >60 mL/min (ref >60)
Glucose, Bld: 98 mg/dL (ref 70–99)
Potassium: 3.5 mmol/L (ref 3.5–5.1)
Sodium: 139 mmol/L (ref 135–145)
Total Bilirubin: 1.5 mg/dL — ABNORMAL HIGH (ref 0.3–1.2)
Total Protein: 5.7 g/dL — ABNORMAL LOW (ref 6.5–8.1)

## 2020-05-06 LAB — D-DIMER, QUANTITATIVE: D-Dimer, Quant: 2.06 ug/mL-FEU — ABNORMAL HIGH (ref 0.00–0.50)

## 2020-05-06 LAB — MAGNESIUM: Magnesium: 1.7 mg/dL (ref 1.7–2.4)

## 2020-05-06 LAB — FERRITIN: Ferritin: 13 ng/mL (ref 11–307)

## 2020-05-06 LAB — PHOSPHORUS: Phosphorus: 3.8 mg/dL (ref 2.5–4.6)

## 2020-05-06 MED ORDER — LOPERAMIDE HCL 2 MG PO CAPS
2.0000 mg | ORAL_CAPSULE | ORAL | Status: DC | PRN
  Administered 2020-05-06: 2 mg via ORAL
  Filled 2020-05-06: qty 1

## 2020-05-06 NOTE — Progress Notes (Signed)
PROGRESS NOTE    Linda Glass  KPT:[id number] DOB: 09-04-1933 DOA: 05/01/2020 PCP: [name], [name], MD    Chief Complaint  Patient presents with  . abnormal labs    Brief Narrative: 87 year old lady with history of hypertension, osteoarthritis, hyperlipidemia, presented to ED for anemia, generalized weakness and fatigue.  On arrival to ED she was found to have a hemoglobin of 5.4, platelets of 112000.  She underwent 10 units of PRBC transfusion and her repeat hemoglobin is 7.8.  She was also diagnosed recently with new onset A. Fib.  Echocardiogram ordered, showed left ventricular ejection fraction of 60 to 65% with moderate LVH, moderately elevated pulmonary pressure.  She was started on IV Lasix 40 mg twice daily by cardiology.  GI consulted for evaluation of severe anemia.she underwent EGD showing hiatal hernia and non bleeding gastric ulcers. Colonoscopy shows mass at the ileocaecal mass , biopsies done.  Currently waiting for GI recommendations on anti coagulation.  Cardiology signed off.   Assessment & Plan:   Principal Problem:   Symptomatic anemia Active Problems:   Class 1 obesity   Hyperlipidemia, mixed   Hypertension, essential, benign   Anxiety disorder   Chronic atrial fibrillation (HCC)   Hypokalemia   2019 novel coronavirus disease (COVID-19)   Pancytopenia (HCC)   Paroxysmal atrial fibrillation (HCC)   Melena   Multiple gastric ulcers   Cecum mass   Severe anemia/severe iron deficiency anemia Stool occult blood is negative, admitted with a hemoglobin of 5.4, underwent 2 units of PRBC transfusion and hemoglobin has improved to 7.8, hemoglobin stable at 7.6.  Ferritin levels at 6, 9.  She will probably need iron infusion prior to discharge.  she underwent EGD showing hiatal hernia and non bleeding gastric ulcers. Colonoscopy shows mass at the ileocaecal mass , biopsies done.  Currently waiting for GI recommendations on anti coagulation No new  complaints. No melanotic stools or hematochezia. .    Probably transition to oral PPI.       Acute diastolic heart failure Fluid overloaded,  BNP elevated on admission. ,  Lasix 40 mg IV bid. Strict intake she has diuresed about 6. L  since admission.  Renal parameters appear to be stable.  Transition to oral lasix. She appears compensated.  Appreciate cardiology recommendations.    Paroxysmal atrial fibrillation Rate controlled.  Echocardiogram showed left ventricular ejection fraction of 60 to 65% with moderate LVH. Her CHA2DS2-VASc score 4, she will need to be on anticoagulation which is being held for severe anemia and possibly GI bleed. Echocardiogram reviewed and discussed with the patient. Currently waiting for GI recommendations on anti coagulation.    Hypokalemia, hypomagnesemia Replaced.    Mild thrombocytopenia of unclear etiology No obvious evidence of bleeding.   Hyperlipidemia continue with Lipitor    Covid 19 infection Patient is currently on room air, denies any shortness of breath or chest pain or cough. Completed the course of remdesivir.   Hypertension Better controlled today on norvasc .    DVT prophylaxis: (SCDs Code Status: Full code) Family Communication: (None at bedside disposition:   Status is: Inpatient  The patient will require care spanning > 2 midnights and should be moved to inpatient because: Ongoing diagnostic testing needed not appropriate for outpatient work up, Unsafe d/c plan, IV treatments appropriate due to intensity of illness or inability to take PO and Inpatient level of care appropriate due to severity of illness  Dispo: The patient is from: Home  Anticipated d/c is to: Home              Patient currently is not medically stable to d/c.   Difficult to place patient No       Consultants:   Gastroenterology  Cardiology  Procedures: Echo Antimicrobials: None  Subjective: No new complaints.    Objective: Vitals:   05/05/20 2031 05/06/20 0437 05/06/20 0444 05/06/20 1411  BP: 136/63 (!) 144/75  128/62  Pulse: 70 63  72  Resp: 16 20  16   Temp: 98 F (36.7 C) 98 F (36.7 C)  (!) 97.5 F (36.4 C)  TempSrc:  Oral  Oral  SpO2: 93% 93%  100%  Weight:   81.5 kg   Height:        Intake/Output Summary (Last 24 hours) at 05/06/2020 1710 Last data filed at 05/06/2020 0900 Gross per 24 hour  Intake 120 ml  Output --  Net 120 ml   Filed Weights   05/01/20 1743 05/05/20 1315 05/06/20 0444  Weight: 79.4 kg 79.4 kg 81.5 kg    Examination:  General exam: ALERT and comfortable not in any distress Respiratory system: Air entry fair bilateral no wheezing or rhonchi.  cardiovascular system:  S2 heard, irregularly irregular, pedal edema resolved Gastrointestinal system: Abdomen is soft, nontender bowel sounds normal Central nervous system: Alert and oriented grossly nonfocal Extremities: Pedal edema resolved Skin no rashes seen  psychiatry: Mood appropriate.    Data Reviewed: I have personally reviewed following labs and imaging studies  CBC: Recent Labs  Lab 05/01/20 1809 05/02/20 1116 05/03/20 0332 05/04/20 0358 05/05/20 0351 05/06/20 0426  WBC 3.7* 6.2 8.0 7.7 7.3 5.6  NEUTROABS 2.3  --  6.0 5.8 5.6 3.9  HGB 5.4* 7.8* 7.6* 7.6* 7.7* 7.7*  HCT 20.6* 26.7* 26.2* 26.6* 27.4* 28.0*  MCV 70.3* 72.8* 73.2* 73.9* 74.9* 76.9*  PLT 112* 110* 101* 111* 105* 107*    Basic Metabolic Panel: Recent Labs  Lab 05/01/20 2121 05/02/20 1116 05/02/20 2225 05/03/20 0332 05/03/20 1937 05/04/20 0358 05/05/20 0351 05/06/20 0426  NA  --  138 137 136  --  135 138 139  K  --  2.7* 2.9* 2.9* 3.2* 3.5 3.3* 3.5  CL  --  103 101 100  --  97* 99 102  CO2  --  23 24 25   --  29 28 26   GLUCOSE  --  106* 159* 93  --  108* 109* 98  BUN  --  8 6* 8  --  7* 7* 8  CREATININE  --  0.45 0.61 0.44  --  0.58 0.51 0.46  CALCIUM  --  8.5* 8.7* 8.4*  --  8.3* 8.8* 8.8*  MG 1.6* 1.4*  --  1.5*   --  1.9 1.7 1.7  PHOS 3.1  --   --  3.0  --  3.0 3.8 3.8    GFR: Estimated Creatinine Clearance: 51.8 mL/min (by C-G formula based on SCr of 0.46 mg/dL).  Liver Function Tests: Recent Labs  Lab 05/02/20 1116 05/04/20 0358 05/05/20 0351 05/06/20 0426  AST 24 20 20 19   ALT 18 16 15 14   ALKPHOS 110 97 93 87  BILITOT 2.5* 2.0* 2.1* 1.5*  PROT 6.2* 5.9* 6.0* 5.7*  ALBUMIN 3.3* 3.1* 3.0* 2.8*    CBG: No results for input(s): GLUCAP in the last 168 hours.   Recent Results (from the past 240 hour(s))  Resp Panel by RT-PCR (Flu A&B, Covid) Nasopharyngeal Swab  Status: Abnormal   Collection Time: 05/01/20  6:46 PM   Specimen: Nasopharyngeal Swab; Nasopharyngeal(NP) swabs in vial transport medium  Result Value Ref Range Status   SARS Coronavirus 2 by RT PCR POSITIVE (A) NEGATIVE Final    Comment: RESULT CALLED TO, READ BACK BY AND VERIFIED WITH: SMITH J. 03.07.22 @ 2056 BY MECIAL J. (NOTE) SARS-CoV-2 target nucleic acids are DETECTED.  The SARS-CoV-2 RNA is generally detectable in upper respiratory specimens during the acute phase of infection. Positive results are indicative of the presence of the identified virus, but do not rule out bacterial infection or co-infection with other pathogens not detected by the test. Clinical correlation with patient history and other diagnostic information is necessary to determine patient infection status. The expected result is Negative.  Fact Sheet for Patients: EntrepreneurPulse.com.au  Fact Sheet for Healthcare Providers: IncredibleEmployment.be  This test is not yet approved or cleared by the [place] FDA and  has been authorized for detection and/or diagnosis of SARS-CoV-2 by FDA under an Emergency Use Authorization (EUA).  This EUA will remain in effect (meaning this test ca n be used) for the duration of  the COVID-19 declaration under Section 564(b)(1) of the Act, 21 U.S.C. section  360bbb-3(b)(1), unless the authorization is terminated or revoked sooner.     Influenza A by PCR NEGATIVE NEGATIVE Final   Influenza B by PCR NEGATIVE NEGATIVE Final    Comment: (NOTE) The Xpert Xpress SARS-CoV-2/FLU/RSV plus assay is intended as an aid in the diagnosis of influenza from Nasopharyngeal swab specimens and should not be used as a sole basis for treatment. Nasal washings and aspirates are unacceptable for Xpert Xpress SARS-CoV-2/FLU/RSV testing.  Fact Sheet for Patients: EntrepreneurPulse.com.au  Fact Sheet for Healthcare Providers: IncredibleEmployment.be  This test is not yet approved or cleared by the [place] FDA and has been authorized for detection and/or diagnosis of SARS-CoV-2 by FDA under an Emergency Use Authorization (EUA). This EUA will remain in effect (meaning this test can be used) for the duration of the COVID-19 declaration under Section 564(b)(1) of the Act, 21 U.S.C. section 360bbb-3(b)(1), unless the authorization is terminated or revoked.  Performed at [facility], [place] [address], [place], [place] [address]          Radiology Studies: CT ANGIO CHEST PE W OR WO CONTRAST  Result Date: 05/05/2020 CLINICAL DATA:  Shortness of breath.  COVID positive. EXAM: CT ANGIOGRAPHY CHEST WITH CONTRAST TECHNIQUE: Multidetector CT imaging of the chest was performed using the standard protocol during bolus administration of intravenous contrast. Multiplanar CT image reconstructions and MIPs were obtained to evaluate the vascular anatomy. CONTRAST:  10mL OMNIPAQUE IOHEXOL 350 MG/ML SOLN COMPARISON:  Chest x-ray 05/02/2020 FINDINGS: Cardiovascular: The heart is within normal limits in size for age. No pericardial effusion. There is tortuosity and calcification of the thoracic aorta but no focal aneurysm or dissection. Scattered coronary artery calcifications. The pulmonary arterial tree is fairly well  opacified. No filling defects to suggest pulmonary embolism. Mediastinum/Nodes: No mediastinal or hilar mass or adenopathy. The esophagus is grossly normal. There is a moderate sized hiatal hernia. Lungs/Pleura: Moderate-sized bilateral pleural effusions with overlying atelectasis, left greater than right. No pulmonary edema or pulmonary infiltrates. No worrisome pulmonary lesions. Upper Abdomen: No significant upper abdominal findings. Advanced aortic calcifications are noted. Musculoskeletal: Lobulated appearing masses in the right breast. Recommend correlation with physical exam and ultrasound or mammography as indicated. No supraclavicular or axillary adenopathy. The bony thorax is intact. There is  a remote appearing compression fracture of T12 with vertebral plana appearance. Review of the MIP images confirms the above findings. IMPRESSION: 1. No CT findings for pulmonary embolism. 2. Tortuosity and calcification of the thoracic aorta but no focal aneurysm or dissection. 3. Moderate-sized bilateral pleural effusions with overlying atelectasis, left greater than right. 4. Lobulated appearing masses in the right breast. Recommend correlation with physical exam and ultrasound or mammography as indicated. 5. Remote appearing T12 compression fracture with vertebral plana appearance. 6. Aortic atherosclerosis. Aortic Atherosclerosis (ICD10-I70.0). Electronically Signed   By: Marijo Sanes M.D.   On: 05/05/2020 19:12        Scheduled Meds: . amLODipine  5 mg Oral Daily  . atorvastatin  40 mg Oral Daily  . citalopram  10 mg Oral Daily  . feeding supplement  1 Container Oral TID BM  . furosemide  40 mg Oral Daily  . magnesium oxide  200 mg Oral Daily  . metoprolol succinate  25 mg Oral Q2000  . pantoprazole (PROTONIX) IV  40 mg Intravenous Q12H  . sucralfate  1 g Oral TID WC & HS   Continuous Infusions:    LOS: 4 days       Hosie Poisson, MD [facility]   To contact the attending  provider between 7A-7P or the covering provider during after hours 7P-7A, please log into the web site [email or web address] and access using universal [place] password for that web site. If you do not have the password, please call the hospital operator.  05/06/2020, 5:10 PM

## 2020-05-06 NOTE — Progress Notes (Signed)
   [facility] will sign off.  Patient remains in rate controlled atrial fibrillation.  BP stable.  Medication Recommendations:  Start oral anticoagulation if/when able from a procedural and anemia standpoint.  For now, risk outweighs benefit Other recommendations (labs, testing, etc):  none Follow up as an outpatient:  We will arrange

## 2020-05-07 LAB — CBC WITH DIFFERENTIAL/PLATELET
Abs Immature Granulocytes: 0.02 10*3/uL (ref 0.00–0.07)
Basophils Absolute: 0 10*3/uL (ref 0.0–0.1)
Basophils Relative: 0 %
Eosinophils Absolute: 0.2 10*3/uL (ref 0.0–0.5)
Eosinophils Relative: 3 %
HCT: 26.9 % — ABNORMAL LOW (ref 36.0–46.0)
Hemoglobin: 7.4 g/dL — ABNORMAL LOW (ref 12.0–15.0)
Immature Granulocytes: 0 %
Lymphocytes Relative: 18 %
Lymphs Abs: 1 10*3/uL (ref 0.7–4.0)
MCH: 21.2 pg — ABNORMAL LOW (ref 26.0–34.0)
MCHC: 27.5 g/dL — ABNORMAL LOW (ref 30.0–36.0)
MCV: 77.1 fL — ABNORMAL LOW (ref 80.0–100.0)
Monocytes Absolute: 0.6 10*3/uL (ref 0.1–1.0)
Monocytes Relative: 11 %
Neutro Abs: 3.7 10*3/uL (ref 1.7–7.7)
Neutrophils Relative %: 68 %
Platelets: 110 10*3/uL — ABNORMAL LOW (ref 150–400)
RBC: 3.49 MIL/uL — ABNORMAL LOW (ref 3.87–5.11)
RDW: 25 % — ABNORMAL HIGH (ref 11.5–15.5)
WBC: 5.5 10*3/uL (ref 4.0–10.5)
nRBC: 0 % (ref 0.0–0.2)

## 2020-05-07 LAB — PHOSPHORUS: Phosphorus: 4.2 mg/dL (ref 2.5–4.6)

## 2020-05-07 LAB — D-DIMER, QUANTITATIVE: D-Dimer, Quant: 2.29 ug/mL-FEU — ABNORMAL HIGH (ref 0.00–0.50)

## 2020-05-07 LAB — PREPARE RBC (CROSSMATCH)

## 2020-05-07 LAB — MAGNESIUM: Magnesium: 1.5 mg/dL — ABNORMAL LOW (ref 1.7–2.4)

## 2020-05-07 LAB — FERRITIN: Ferritin: 12 ng/mL (ref 11–307)

## 2020-05-07 LAB — C-REACTIVE PROTEIN: CRP: 6.9 mg/dL — ABNORMAL HIGH (ref <1.0)

## 2020-05-07 MED ORDER — SODIUM CHLORIDE 0.9% IV SOLUTION: Freq: Once | INTRAVENOUS | Status: AC

## 2020-05-07 MED ORDER — MAGNESIUM SULFATE 2 GM/50ML IV SOLN
2.0000 g | Freq: Once | INTRAVENOUS | Status: AC
  Administered 2020-05-07: 2 g via INTRAVENOUS
  Filled 2020-05-07: qty 50

## 2020-05-07 NOTE — Evaluation (Addendum)
Physical Therapy Evaluation Patient Details Name: Linda Glass MRN: [id number] DOB: 10/10/1933 Today's Date: 05/07/2020   History of Present Illness  87 yo female admitted with anemia, CHF, COVID, gastreoenteritis/diarrhea. Hx of OA, obesity, Afib  Clinical Impression  On eval, pt required Min assist for mobility. She walked ~15 feet x 2 with handheld assist and/or "furniture walking". Pt is unsteady and at risk for falls when mobilizing. Ambulation distance was limited by bowel incontinence/diarrhea. Mod A for hygiene/clean up/ADL. Pt presents with general weakness, decreased activity tolerance, and impaired gait and balance. Pt is lives alone is currently not able to manage at home alone. Recommend ST rehab at SNF to improve overall functional mobility and to regain independence-pt is agreeable. Will continue to follow and progress activity as tolerated.     Follow Up Recommendations SNF    Equipment Recommendations  Rolling walker with 5" wheels    Recommendations for Other Services       Precautions / Restrictions Precautions Precautions: Fall Precaution Comments: airborne; diarrhea Restrictions Weight Bearing Restrictions: No      Mobility  Bed Mobility               General bed mobility comments: oob in recliner    Transfers Overall transfer level: Needs assistance   Transfers: Sit to/from Stand Sit to Stand: Min assist         General transfer comment: Assist to rise, steady, control descent. Cues for safety, hand placement. Fall risk.  Ambulation/Gait Ambulation/Gait assistance: Min assist Gait Distance (Feet): 15 Feet (x2) Assistive device: 1 person hand held assist Gait Pattern/deviations: Step-through pattern;Decreased stride length     General Gait Details: Assist to stabilize/support pt throughout short distance. Cues for safety. Pt "furniture walking" in addition to using therapist's hand for assist/support. Fall risk. Distance  limited by bowel incontinence/diarrhea.  Stairs            Wheelchair Mobility    Modified Rankin (Stroke Patients Only)       Balance Overall balance assessment: Needs assistance         Standing balance support: Single extremity supported Standing balance-Leahy Scale: Poor                               Pertinent Vitals/Pain Pain Assessment: No/denies pain    Home Living Family/patient expects to be discharged to:: Unsure Living Arrangements: Alone Available Help at Discharge: Family;Available PRN/intermittently Type of Home: House Home Access: Stairs to enter   Entrance Stairs-Number of Steps: 3 Home Layout: Able to live on main level with bedroom/bathroom;Two level Home Equipment: Walker - 2 wheels;Cane - single point      Prior Function Level of Independence: Needs assistance   Gait / Transfers Assistance Needed: no device inside home. cane or grocery cart in community  ADL's / Homemaking Assistance Needed: assist with homemaking (cleaning, laundry)        Hand Dominance        Extremity/Trunk Assessment   Upper Extremity Assessment Upper Extremity Assessment: Overall WFL for tasks assessed    Lower Extremity Assessment Lower Extremity Assessment: Generalized weakness    Cervical / Trunk Assessment Cervical / Trunk Assessment: Normal  Communication   Communication: HOH  Cognition Arousal/Alertness: Awake/alert Behavior During Therapy: WFL for tasks assessed/performed Overall Cognitive Status: Within Functional Limits for tasks assessed  General Comments      Exercises     Assessment/Plan    PT Assessment Patient needs continued PT services  PT Problem List Decreased strength;Decreased mobility;Decreased knowledge of use of DME;Decreased balance;Decreased activity tolerance       PT Treatment Interventions DME instruction;Gait training;Therapeutic  activities;Therapeutic exercise;Patient/family education;Balance training;Functional mobility training    PT Goals (Current goals can be found in the Care Plan section)  Acute Rehab PT Goals Patient Stated Goal: rehab to get stronger and regain independence PT Goal Formulation: With patient Time For Goal Achievement: 05/21/20 Potential to Achieve Goals: Good    Frequency Min 2X/week   Barriers to discharge        Co-evaluation               AM-PAC PT "6 Clicks" Mobility  Outcome Measure Help needed turning from your back to your side while in a flat bed without using bedrails?: A Little Help needed moving from lying on your back to sitting on the side of a flat bed without using bedrails?: A Little Help needed moving to and from a bed to a chair (including a wheelchair)?: A Little Help needed standing up from a chair using your arms (e.g., wheelchair or bedside chair)?: A Little Help needed to walk in hospital room?: A Little Help needed climbing 3-5 steps with a railing? : A Little 6 Click Score: 18    End of Session Equipment Utilized During Treatment: Gait belt Activity Tolerance: Patient tolerated treatment well Patient left: in chair;with call bell/phone within reach;with chair alarm set   PT Visit Diagnosis: Muscle weakness (generalized) (M62.81);Unsteadiness on feet (R26.81)    Time: 1020-1040 PT Time Calculation (min) (ACUTE ONLY): 20 min   Charges:   PT Evaluation $PT Eval Moderate Complexity: 1 Mod            Doreatha Massed, PT Acute Rehabilitation  Office: [phone] Pager: [phone]

## 2020-05-07 NOTE — Progress Notes (Signed)
PROGRESS NOTE    Linda Glass  ELF:[id number] DOB: 05-Aug-1933 DOA: 05/01/2020 PCP: [name], [name], MD    Chief Complaint  Patient presents with  . abnormal labs    Brief Narrative: 87 year old lady with history of hypertension, osteoarthritis, hyperlipidemia, presented to ED for anemia, generalized weakness and fatigue.  On arrival to ED she was found to have a hemoglobin of 5.4, platelets of 112000.  She underwent 10 units of PRBC transfusion and her repeat hemoglobin is 7.8.  She was also diagnosed recently with new onset A. Fib.  Echocardiogram ordered, showed left ventricular ejection fraction of 60 to 65% with moderate LVH, moderately elevated pulmonary pressure.  She was started on IV Lasix 40 mg twice daily by cardiology.  GI consulted for evaluation of severe anemia.she underwent EGD showing hiatal hernia and non bleeding gastric ulcers. Colonoscopy shows mass at the ileocaecal mass , biopsies done.  GI recommends to hold off anticoagulation until Wednesday ~biopsies from the ileocecal mass comes back. Patient's hemoglobin dropped to 7.4, 1 unit of PRBC transfusion will be ordered. PT evaluation recommending SNF, TOC made aware. Cardiology signed off.   Assessment & Plan:   Principal Problem:   Symptomatic anemia Active Problems:   Class 1 obesity   Hyperlipidemia, mixed   Hypertension, essential, benign   Anxiety disorder   Chronic atrial fibrillation (HCC)   Hypokalemia   2019 novel coronavirus disease (COVID-19)   Pancytopenia (HCC)   Paroxysmal atrial fibrillation (HCC)   Melena   Multiple gastric ulcers   Cecum mass   Severe anemia/severe iron deficiency anemia Stool occult blood is negative, admitted with a hemoglobin of 5.4, underwent 2 units of PRBC transfusion, posttransfusion hemoglobin is 7.8 dropped to 7.4 today..  Ferritin levels at 6, 9.  She will probably need iron infusion prior to discharge.  she underwent EGD showing hiatal hernia and non  bleeding gastric ulcers. Colonoscopy shows mass at the ileocaecal mass , biopsies done.  GI recommends to hold off anticoagulation till Wednesday, until biopsies from the ileocecal mass are obtained.  No new complaints. No melanotic stools or hematochezia. .    Probably transition to oral PPI.  Hemoglobin dropped to 7.4 will order 1 unit of PRBC transfusion.      Acute diastolic heart failure Fluid overloaded,  BNP elevated on admission. ,  Lasix 40 mg IV bid. Strict intake she has diuresed about 6. L  since admission.  Renal parameters appear to be stable.  Transition to oral lasix. She appears compensated.  Appreciate cardiology recommendations.  No new recommendations.   Paroxysmal atrial fibrillation Rate controlled.  Echocardiogram showed left ventricular ejection fraction of 60 to 65% with moderate LVH. Her CHA2DS2-VASc score 4, she will need to be on anticoagulation which is being held for severe anemia and possibly GI bleed. Echocardiogram reviewed and discussed with the patient. Anticoagulation on hold till Wednesday   Hypokalemia, hypomagnesemia Replaced.    Mild thrombocytopenia of unclear etiology No obvious evidence of bleeding.   Hyperlipidemia continue with Lipitor    Covid 19 infection Patient is currently on room air, denies any shortness of breath or chest pain or cough. Completed the course of remdesivir.   Hypertension Well-controlled   DVT prophylaxis: (SCDs Code Status: Full code) Family Communication: (None at bedside disposition:   Status is: Inpatient  The patient will require care spanning > 2 midnights and should be moved to inpatient because: Ongoing diagnostic testing needed not appropriate for outpatient work up, Unsafe d/c  plan, IV treatments appropriate due to intensity of illness or inability to take PO and Inpatient level of care appropriate due to severity of illness  Dispo: The patient is from: Home              Anticipated d/c  is to: Home              Patient currently is not medically stable to d/c.   Difficult to place patient No       Consultants:   Gastroenterology  Cardiology  Procedures: Echo Antimicrobials: None  Subjective: No chest pain or shortness of breath  Objective: Vitals:   05/06/20 1411 05/06/20 2132 05/07/20 0620 05/07/20 1335  BP: 128/62 (!) 139/59 (!) 142/71 (!) 144/68  Pulse: 72 69 65 69  Resp: 16 20 20 17   Temp: (!) 97.5 F (36.4 C) 97.8 F (36.6 C) 98.4 F (36.9 C) 97.7 F (36.5 C)  TempSrc: Oral Oral  Oral  SpO2: 100% 94% 93% 99%  Weight:   82.1 kg   Height:        Intake/Output Summary (Last 24 hours) at 05/07/2020 1554 Last data filed at 05/07/2020 1100 Gross per 24 hour  Intake 600 ml  Output 350 ml  Net 250 ml   Filed Weights   05/05/20 1315 05/06/20 0444 05/07/20 0620  Weight: 79.4 kg 81.5 kg 82.1 kg    Examination:  General exam: Alert and comfortable not in any distress Respiratory system: Fair bilateral no wheezing or rhonchi cardiovascular system:  S1-S2 heard, irregularly irregular, no pedal edema Gastrointestinal system: Abdomen is soft, nontender, nondistended, bowel sounds normal Central nervous system: Alert and oriented, grossly nonfocal Extremities: Pedal edema resolved Skin no rashes seen  psychiatry: Mood is appropriate  Data Reviewed: I have personally reviewed following labs and imaging studies  CBC: Recent Labs  Lab 05/03/20 0332 05/04/20 0358 05/05/20 0351 05/06/20 0426 05/07/20 0416  WBC 8.0 7.7 7.3 5.6 5.5  NEUTROABS 6.0 5.8 5.6 3.9 3.7  HGB 7.6* 7.6* 7.7* 7.7* 7.4*  HCT 26.2* 26.6* 27.4* 28.0* 26.9*  MCV 73.2* 73.9* 74.9* 76.9* 77.1*  PLT 101* 111* 105* 107* 110*    Basic Metabolic Panel: Recent Labs  Lab 05/02/20 2225 05/03/20 0332 05/03/20 1937 05/04/20 0358 05/05/20 0351 05/06/20 0426 05/07/20 0416  NA 137 136  --  135 138 139  --   K 2.9* 2.9* 3.2* 3.5 3.3* 3.5  --   CL 101 100  --  97* 99 102  --    CO2 24 25  --  29 28 26   --   GLUCOSE 159* 93  --  108* 109* 98  --   BUN 6* 8  --  7* 7* 8  --   CREATININE 0.61 0.44  --  0.58 0.51 0.46  --   CALCIUM 8.7* 8.4*  --  8.3* 8.8* 8.8*  --   MG  --  1.5*  --  1.9 1.7 1.7 1.5*  PHOS  --  3.0  --  3.0 3.8 3.8 4.2    GFR: Estimated Creatinine Clearance: 52 mL/min (by C-G formula based on SCr of 0.46 mg/dL).  Liver Function Tests: Recent Labs  Lab 05/02/20 1116 05/04/20 0358 05/05/20 0351 05/06/20 0426  AST 24 20 20 19   ALT 18 16 15 14   ALKPHOS 110 97 93 87  BILITOT 2.5* 2.0* 2.1* 1.5*  PROT 6.2* 5.9* 6.0* 5.7*  ALBUMIN 3.3* 3.1* 3.0* 2.8*    CBG: No results for input(s):  GLUCAP in the last 168 hours.   Recent Results (from the past 240 hour(s))  Resp Panel by RT-PCR (Flu A&B, Covid) Nasopharyngeal Swab     Status: Abnormal   Collection Time: 05/01/20  6:46 PM   Specimen: Nasopharyngeal Swab; Nasopharyngeal(NP) swabs in vial transport medium  Result Value Ref Range Status   SARS Coronavirus 2 by RT PCR POSITIVE (A) NEGATIVE Final    Comment: RESULT CALLED TO, READ BACK BY AND VERIFIED WITH: SMITH J. 03.07.22 @ 2056 BY MECIAL J. (NOTE) SARS-CoV-2 target nucleic acids are DETECTED.  The SARS-CoV-2 RNA is generally detectable in upper respiratory specimens during the acute phase of infection. Positive results are indicative of the presence of the identified virus, but do not rule out bacterial infection or co-infection with other pathogens not detected by the test. Clinical correlation with patient history and other diagnostic information is necessary to determine patient infection status. The expected result is Negative.  Fact Sheet for Patients: EntrepreneurPulse.com.au  Fact Sheet for Healthcare Providers: IncredibleEmployment.be  This test is not yet approved or cleared by the [place] FDA and  has been authorized for detection and/or diagnosis of SARS-CoV-2 by FDA under  an Emergency Use Authorization (EUA).  This EUA will remain in effect (meaning this test ca n be used) for the duration of  the COVID-19 declaration under Section 564(b)(1) of the Act, 21 U.S.C. section 360bbb-3(b)(1), unless the authorization is terminated or revoked sooner.     Influenza A by PCR NEGATIVE NEGATIVE Final   Influenza B by PCR NEGATIVE NEGATIVE Final    Comment: (NOTE) The Xpert Xpress SARS-CoV-2/FLU/RSV plus assay is intended as an aid in the diagnosis of influenza from Nasopharyngeal swab specimens and should not be used as a sole basis for treatment. Nasal washings and aspirates are unacceptable for Xpert Xpress SARS-CoV-2/FLU/RSV testing.  Fact Sheet for Patients: EntrepreneurPulse.com.au  Fact Sheet for Healthcare Providers: IncredibleEmployment.be  This test is not yet approved or cleared by the [place] FDA and has been authorized for detection and/or diagnosis of SARS-CoV-2 by FDA under an Emergency Use Authorization (EUA). This EUA will remain in effect (meaning this test can be used) for the duration of the COVID-19 declaration under Section 564(b)(1) of the Act, 21 U.S.C. section 360bbb-3(b)(1), unless the authorization is terminated or revoked.  Performed at [facility], [place] [address], [place], [place] [address]          Radiology Studies: CT ANGIO CHEST PE W OR WO CONTRAST  Result Date: 05/05/2020 CLINICAL DATA:  Shortness of breath.  COVID positive. EXAM: CT ANGIOGRAPHY CHEST WITH CONTRAST TECHNIQUE: Multidetector CT imaging of the chest was performed using the standard protocol during bolus administration of intravenous contrast. Multiplanar CT image reconstructions and MIPs were obtained to evaluate the vascular anatomy. CONTRAST:  78mL OMNIPAQUE IOHEXOL 350 MG/ML SOLN COMPARISON:  Chest x-ray 05/02/2020 FINDINGS: Cardiovascular: The heart is within normal limits in size for age. No  pericardial effusion. There is tortuosity and calcification of the thoracic aorta but no focal aneurysm or dissection. Scattered coronary artery calcifications. The pulmonary arterial tree is fairly well opacified. No filling defects to suggest pulmonary embolism. Mediastinum/Nodes: No mediastinal or hilar mass or adenopathy. The esophagus is grossly normal. There is a moderate sized hiatal hernia. Lungs/Pleura: Moderate-sized bilateral pleural effusions with overlying atelectasis, left greater than right. No pulmonary edema or pulmonary infiltrates. No worrisome pulmonary lesions. Upper Abdomen: No significant upper abdominal findings. Advanced aortic calcifications are noted. Musculoskeletal: Lobulated  appearing masses in the right breast. Recommend correlation with physical exam and ultrasound or mammography as indicated. No supraclavicular or axillary adenopathy. The bony thorax is intact. There is a remote appearing compression fracture of T12 with vertebral plana appearance. Review of the MIP images confirms the above findings. IMPRESSION: 1. No CT findings for pulmonary embolism. 2. Tortuosity and calcification of the thoracic aorta but no focal aneurysm or dissection. 3. Moderate-sized bilateral pleural effusions with overlying atelectasis, left greater than right. 4. Lobulated appearing masses in the right breast. Recommend correlation with physical exam and ultrasound or mammography as indicated. 5. Remote appearing T12 compression fracture with vertebral plana appearance. 6. Aortic atherosclerosis. Aortic Atherosclerosis (ICD10-I70.0). Electronically Signed   By: Marijo Sanes M.D.   On: 05/05/2020 19:12        Scheduled Meds: . amLODipine  5 mg Oral Daily  . atorvastatin  40 mg Oral Daily  . citalopram  10 mg Oral Daily  . feeding supplement  1 Container Oral TID BM  . furosemide  40 mg Oral Daily  . magnesium oxide  200 mg Oral Daily  . metoprolol succinate  25 mg Oral Q2000  .  pantoprazole (PROTONIX) IV  40 mg Intravenous Q12H  . sucralfate  1 g Oral TID WC & HS   Continuous Infusions:    LOS: 5 days       Hosie Poisson, MD [facility]   To contact the attending provider between 7A-7P or the covering provider during after hours 7P-7A, please log into the web site [email or web address] and access using universal [place] password for that web site. If you do not have the password, please call the hospital operator.  05/07/2020, 3:54 PM

## 2020-05-08 ENCOUNTER — Encounter (HOSPITAL_COMMUNITY): Payer: Self-pay | Admitting: Gastroenterology

## 2020-05-08 LAB — BASIC METABOLIC PANEL
Anion gap: 9 (ref 5–15)
BUN: 10 mg/dL (ref 8–23)
CO2: 26 mmol/L (ref 22–32)
Calcium: 8.7 mg/dL — ABNORMAL LOW (ref 8.9–10.3)
Chloride: 102 mmol/L (ref 98–111)
Creatinine, Ser: 0.42 mg/dL — ABNORMAL LOW (ref 0.44–1.00)
GFR, Estimated: >60 mL/min (ref >60)
Glucose, Bld: 97 mg/dL (ref 70–99)
Potassium: 2.7 mmol/L — CL (ref 3.5–5.1)
Sodium: 137 mmol/L (ref 135–145)

## 2020-05-08 LAB — TYPE AND SCREEN
ABO/RH(D): O POS
Antibody Screen: NEGATIVE
Unit division: 0
Unit division: 0

## 2020-05-08 LAB — BPAM RBC
Blood Product Expiration Date: 202204052359
Blood Product Expiration Date: 202204122359
ISSUE DATE / TIME: 202203131735
ISSUE DATE / TIME: 202203132136
Unit Type and Rh: 5100
Unit Type and Rh: 5100

## 2020-05-08 LAB — CBC
HCT: 31.3 % — ABNORMAL LOW (ref 36.0–46.0)
Hemoglobin: 9.2 g/dL — ABNORMAL LOW (ref 12.0–15.0)
MCH: 22.9 pg — ABNORMAL LOW (ref 26.0–34.0)
MCHC: 29.4 g/dL — ABNORMAL LOW (ref 30.0–36.0)
MCV: 77.9 fL — ABNORMAL LOW (ref 80.0–100.0)
Platelets: 111 10*3/uL — ABNORMAL LOW (ref 150–400)
RBC: 4.02 MIL/uL (ref 3.87–5.11)
RDW: 24.1 % — ABNORMAL HIGH (ref 11.5–15.5)
WBC: 5.4 10*3/uL (ref 4.0–10.5)
nRBC: 0 % (ref 0.0–0.2)

## 2020-05-08 LAB — SURGICAL PATHOLOGY

## 2020-05-08 LAB — POTASSIUM: Potassium: 3.3 mmol/L — ABNORMAL LOW (ref 3.5–5.1)

## 2020-05-08 MED ORDER — HYDRALAZINE HCL 25 MG PO TABS
25.0000 mg | ORAL_TABLET | Freq: Three times a day (TID) | ORAL | Status: DC | PRN
  Administered 2020-05-10: 25 mg via ORAL
  Filled 2020-05-08: qty 1

## 2020-05-08 MED ORDER — POTASSIUM CHLORIDE CRYS ER 20 MEQ PO TBCR
30.0000 meq | EXTENDED_RELEASE_TABLET | Freq: Once | ORAL | Status: AC
  Administered 2020-05-08: 30 meq via ORAL
  Filled 2020-05-08: qty 1

## 2020-05-08 MED ORDER — MAGNESIUM SULFATE 2 GM/50ML IV SOLN
2.0000 g | Freq: Once | INTRAVENOUS | Status: AC
  Administered 2020-05-08: 2 g via INTRAVENOUS
  Filled 2020-05-08: qty 50

## 2020-05-08 MED ORDER — POTASSIUM CHLORIDE 10 MEQ/100ML IV SOLN
10.0000 meq | INTRAVENOUS | Status: AC
Start: 2020-05-08 — End: 2020-05-08
  Administered 2020-05-08 (×3): 10 meq via INTRAVENOUS
  Filled 2020-05-08 (×3): qty 100

## 2020-05-08 NOTE — Care Management Important Message (Signed)
Important Message  Patient Details IM Letter placed in Patient's door caddy. Name: Linda Glass MRN: [id number] Date of Birth: 11-09-1933   Medicare Important Message Given:  Yes     Kerin Salen 05/08/2020, 11:49 AM

## 2020-05-08 NOTE — Progress Notes (Signed)
PROGRESS NOTE    Linda Glass  HDQ:[id number] DOB: 1933/04/22 DOA: 05/01/2020 PCP: [name], [name], MD    Chief Complaint  Patient presents with  . abnormal labs    Brief Narrative: 87 year old lady with history of hypertension, osteoarthritis, hyperlipidemia, presented to ED for anemia, generalized weakness and fatigue.  On arrival to ED she was found to have a hemoglobin of 5.4, platelets of 112000.  She underwent 10 units of PRBC transfusion and her repeat hemoglobin is 7.8.  She was also diagnosed recently with new onset A. Fib.  Echocardiogram ordered, showed left ventricular ejection fraction of 60 to 65% with moderate LVH, moderately elevated pulmonary pressure.  She was started on IV Lasix 40 mg twice daily by cardiology.  GI consulted for evaluation of severe anemia.she underwent EGD showing hiatal hernia and non bleeding gastric ulcers. Colonoscopy shows mass at the ileocaecal mass , biopsies done.  GI recommends to hold off anticoagulation until Wednesday ~biopsies from the ileocecal mass comes back. Patient's hemoglobin dropped to 7.4 on 3/13, 1 unit of PRBC transfusion ordered, repeat hemoglobin is around 9.  PT evaluation recommending SNF, TOC made aware. Cardiology signed off.  Pt seen and examined at bedside , no new complaints.   Assessment & Plan:   Principal Problem:   Symptomatic anemia Active Problems:   Class 1 obesity   Hyperlipidemia, mixed   Hypertension, essential, benign   Anxiety disorder   Chronic atrial fibrillation (HCC)   Hypokalemia   2019 novel coronavirus disease (COVID-19)   Pancytopenia (HCC)   Paroxysmal atrial fibrillation (HCC)   Melena   Multiple gastric ulcers   Cecum mass   Severe anemia/severe iron deficiency anemia Stool occult blood is negative, admitted with a hemoglobin of 5.4, underwent a total of  3  units of PRBC transfusion since admission. She will probably need iron infusion prior to discharge.  she underwent  EGD showing 5 cm hiatal hernia. - Non-bleeding gastric ulcers with a clean ulcer base. Colonoscopy shows A polypoid large mass was found at the ileocecal valve. The mass measured five cm in length. No bleeding was present. Biopsies were taken with a cold forceps for histology. Multiple small and large-mouthed diverticula were found in the sigmoid colon and descending colon. GI recommends to hold off on anti coagulation , until we get the results back.  No new complaints. No melanotic stools or hematochezia. .        Acute diastolic heart failure Fluid overloaded,  BNP elevated on admission. ,  Lasix 40 mg IV bid. Strict intake she has diuresed about 6. L  since admission.  Renal parameters appear to be stable.  Transitioned to oral lasix. She appears compensated.  Appreciate cardiology recommendations.  No new recommendations.   Paroxysmal atrial fibrillation Rate controlled.  Echocardiogram showed left ventricular ejection fraction of 60 to 65% with moderate LVH. Her CHA2DS2-VASc score 4, she will need to be on anticoagulation which is being held for severe anemia and GI bleed. Echocardiogram reviewed and discussed with the patient. Anticoagulation on hold till we get the results of the biopsy.    Hypokalemia, hypomagnesemia Replaced. Repeat in the morning.    Mild thrombocytopenia of unclear etiology No obvious evidence of bleeding.   Hyperlipidemia continue with Lipitor    Covid 19 infection Patient is currently on room air, denies any shortness of breath or chest pain or cough. Completed the course of remdesivir.   Hypertension Blood pressure parameters are slightly elevated, as needed hydralazine  will be ordered.   DVT prophylaxis: (SCDs Code Status: Full code) Family Communication: (None at bedside disposition:   Status is: Inpatient  The patient will require care spanning > 2 midnights and should be moved to inpatient because: Unsafe d/c plan  Dispo: The  patient is from: Home              Anticipated d/c is to: SNF              Patient currently is not medically stable to d/c.   Difficult to place patient No       Consultants:   Gastroenterology  Cardiology  Procedures: Echo Antimicrobials: None  Subjective: No chest pain or shortness of breath  Objective: Vitals:   05/07/20 2226 05/08/20 0045 05/08/20 0500 05/08/20 0554  BP: (!) 153/68 (!) 154/64  (!) 163/71  Pulse: 61 (!) 59  74  Resp: 18 18  20   Temp: 97.7 F (36.5 C) 97.8 F (36.6 C)  98.3 F (36.8 C)  TempSrc: Oral Oral  Oral  SpO2: 99% 98%  (!) 89%  Weight:   83.2 kg   Height:        Intake/Output Summary (Last 24 hours) at 05/08/2020 1018 Last data filed at 05/08/2020 0600 Gross per 24 hour  Intake 2014.17 ml  Output 1200 ml  Net 814.17 ml   Filed Weights   05/06/20 0444 05/07/20 0620 05/08/20 0500  Weight: 81.5 kg 82.1 kg 83.2 kg    Examination:  General exam: Alert, on room air not in any kind of distress Respiratory system: Air entry fair bilateral, no wheezing or rhonchi cardiovascular system: S1-S2 heard, irregularly irregular, no JVD, pedal edema has improved Gastrointestinal system: Abdomen is soft, nontender, nondistended, bowel sounds normal Central nervous system: Alert and oriented, grossly nonfocal Extremities: Pedal edema has improved Skin no rashes seen  psychiatry: M mood is appropriate  Data Reviewed: I have personally reviewed following labs and imaging studies  CBC: Recent Labs  Lab 05/03/20 0332 05/04/20 0358 05/05/20 0351 05/06/20 0426 05/07/20 0416 05/08/20 0320  WBC 8.0 7.7 7.3 5.6 5.5 5.4  NEUTROABS 6.0 5.8 5.6 3.9 3.7  --   HGB 7.6* 7.6* 7.7* 7.7* 7.4* 9.2*  HCT 26.2* 26.6* 27.4* 28.0* 26.9* 31.3*  MCV 73.2* 73.9* 74.9* 76.9* 77.1* 77.9*  PLT 101* 111* 105* 107* 110* 111*    Basic Metabolic Panel: Recent Labs  Lab 05/03/20 0332 05/03/20 1937 05/04/20 0358 05/05/20 0351 05/06/20 0426 05/07/20 0416  05/08/20 0320  NA 136  --  135 138 139  --  137  K 2.9* 3.2* 3.5 3.3* 3.5  --  2.7*  CL 100  --  97* 99 102  --  102  CO2 25  --  29 28 26   --  26  GLUCOSE 93  --  108* 109* 98  --  97  BUN 8  --  7* 7* 8  --  10  CREATININE 0.44  --  0.58 0.51 0.46  --  0.42*  CALCIUM 8.4*  --  8.3* 8.8* 8.8*  --  8.7*  MG 1.5*  --  1.9 1.7 1.7 1.5*  --   PHOS 3.0  --  3.0 3.8 3.8 4.2  --     GFR: Estimated Creatinine Clearance: 52.3 mL/min (A) (by C-G formula based on SCr of 0.42 mg/dL (L)).  Liver Function Tests: Recent Labs  Lab 05/02/20 1116 05/04/20 0358 05/05/20 0351 05/06/20 0426  AST 24 20 20  19  ALT 18 16 15 14   ALKPHOS 110 97 93 87  BILITOT 2.5* 2.0* 2.1* 1.5*  PROT 6.2* 5.9* 6.0* 5.7*  ALBUMIN 3.3* 3.1* 3.0* 2.8*    CBG: No results for input(s): GLUCAP in the last 168 hours.   Recent Results (from the past 240 hour(s))  Resp Panel by RT-PCR (Flu A&B, Covid) Nasopharyngeal Swab     Status: Abnormal   Collection Time: 05/01/20  6:46 PM   Specimen: Nasopharyngeal Swab; Nasopharyngeal(NP) swabs in vial transport medium  Result Value Ref Range Status   SARS Coronavirus 2 by RT PCR POSITIVE (A) NEGATIVE Final    Comment: RESULT CALLED TO, READ BACK BY AND VERIFIED WITH: SMITH J. 03.07.22 @ 2056 BY MECIAL J. (NOTE) SARS-CoV-2 target nucleic acids are DETECTED.  The SARS-CoV-2 RNA is generally detectable in upper respiratory specimens during the acute phase of infection. Positive results are indicative of the presence of the identified virus, but do not rule out bacterial infection or co-infection with other pathogens not detected by the test. Clinical correlation with patient history and other diagnostic information is necessary to determine patient infection status. The expected result is Negative.  Fact Sheet for Patients: EntrepreneurPulse.com.au  Fact Sheet for Healthcare Providers: IncredibleEmployment.be  This test is not yet  approved or cleared by the [place] FDA and  has been authorized for detection and/or diagnosis of SARS-CoV-2 by FDA under an Emergency Use Authorization (EUA).  This EUA will remain in effect (meaning this test ca n be used) for the duration of  the COVID-19 declaration under Section 564(b)(1) of the Act, 21 U.S.C. section 360bbb-3(b)(1), unless the authorization is terminated or revoked sooner.     Influenza A by PCR NEGATIVE NEGATIVE Final   Influenza B by PCR NEGATIVE NEGATIVE Final    Comment: (NOTE) The Xpert Xpress SARS-CoV-2/FLU/RSV plus assay is intended as an aid in the diagnosis of influenza from Nasopharyngeal swab specimens and should not be used as a sole basis for treatment. Nasal washings and aspirates are unacceptable for Xpert Xpress SARS-CoV-2/FLU/RSV testing.  Fact Sheet for Patients: EntrepreneurPulse.com.au  Fact Sheet for Healthcare Providers: IncredibleEmployment.be  This test is not yet approved or cleared by the [place] FDA and has been authorized for detection and/or diagnosis of SARS-CoV-2 by FDA under an Emergency Use Authorization (EUA). This EUA will remain in effect (meaning this test can be used) for the duration of the COVID-19 declaration under Section 564(b)(1) of the Act, 21 U.S.C. section 360bbb-3(b)(1), unless the authorization is terminated or revoked.  Performed at [facility], [place] [address] [place], [place] [address]          Radiology Studies: No results found.      Scheduled Meds: . amLODipine  5 mg Oral Daily  . atorvastatin  40 mg Oral Daily  . citalopram  10 mg Oral Daily  . feeding supplement  1 Container Oral TID BM  . furosemide  40 mg Oral Daily  . magnesium oxide  200 mg Oral Daily  . metoprolol succinate  25 mg Oral Q2000  . pantoprazole (PROTONIX) IV  40 mg Intravenous Q12H  . sucralfate  1 g Oral TID WC & HS   Continuous Infusions: .  magnesium sulfate bolus IVPB    . potassium chloride 10 mEq (05/08/20 0919)     LOS: 6 days       Hosie Poisson, MD [facility]   To contact the attending provider between 7A-7P or the covering provider  during after hours 7P-7A, please log into the web site [email or web address] and access using universal [place] password for that web site. If you do not have the password, please call the hospital operator.  05/08/2020, 10:18 AM

## 2020-05-09 ENCOUNTER — Telehealth: Payer: Self-pay | Admitting: Gastroenterology

## 2020-05-09 LAB — CBC WITH DIFFERENTIAL/PLATELET
Abs Immature Granulocytes: 0.03 10*3/uL (ref 0.00–0.07)
Basophils Absolute: 0 10*3/uL (ref 0.0–0.1)
Basophils Relative: 0 %
Eosinophils Absolute: 0.1 10*3/uL (ref 0.0–0.5)
Eosinophils Relative: 1 %
HCT: 32.2 % — ABNORMAL LOW (ref 36.0–46.0)
Hemoglobin: 9.6 g/dL — ABNORMAL LOW (ref 12.0–15.0)
Immature Granulocytes: 0 %
Lymphocytes Relative: 10 %
Lymphs Abs: 0.9 10*3/uL (ref 0.7–4.0)
MCH: 23.1 pg — ABNORMAL LOW (ref 26.0–34.0)
MCHC: 29.8 g/dL — ABNORMAL LOW (ref 30.0–36.0)
MCV: 77.6 fL — ABNORMAL LOW (ref 80.0–100.0)
Monocytes Absolute: 0.9 10*3/uL (ref 0.1–1.0)
Monocytes Relative: 9 %
Neutro Abs: 7.3 10*3/uL (ref 1.7–7.7)
Neutrophils Relative %: 80 %
Platelets: 148 10*3/uL — ABNORMAL LOW (ref 150–400)
RBC: 4.15 MIL/uL (ref 3.87–5.11)
RDW: 24.9 % — ABNORMAL HIGH (ref 11.5–15.5)
WBC: 9.3 10*3/uL (ref 4.0–10.5)
nRBC: 0 % (ref 0.0–0.2)

## 2020-05-09 LAB — BASIC METABOLIC PANEL
Anion gap: 10 (ref 5–15)
BUN: 9 mg/dL (ref 8–23)
CO2: 25 mmol/L (ref 22–32)
Calcium: 8.7 mg/dL — ABNORMAL LOW (ref 8.9–10.3)
Chloride: 101 mmol/L (ref 98–111)
Creatinine, Ser: 0.51 mg/dL (ref 0.44–1.00)
GFR, Estimated: >60 mL/min (ref >60)
Glucose, Bld: 90 mg/dL (ref 70–99)
Potassium: 2.6 mmol/L — CL (ref 3.5–5.1)
Sodium: 136 mmol/L (ref 135–145)

## 2020-05-09 LAB — MAGNESIUM: Magnesium: 1.5 mg/dL — ABNORMAL LOW (ref 1.7–2.4)

## 2020-05-09 MED ORDER — POTASSIUM CHLORIDE CRYS ER 20 MEQ PO TBCR
40.0000 meq | EXTENDED_RELEASE_TABLET | Freq: Two times a day (BID) | ORAL | Status: DC
  Administered 2020-05-09: 40 meq via ORAL
  Filled 2020-05-09: qty 2

## 2020-05-09 MED ORDER — POTASSIUM CHLORIDE 10 MEQ/100ML IV SOLN
10.0000 meq | INTRAVENOUS | Status: AC
  Administered 2020-05-09 (×3): 10 meq via INTRAVENOUS
  Filled 2020-05-09 (×3): qty 100

## 2020-05-09 MED ORDER — MAGNESIUM SULFATE 2 GM/50ML IV SOLN
2.0000 g | Freq: Once | INTRAVENOUS | Status: AC
  Administered 2020-05-10: 2 g via INTRAVENOUS
  Filled 2020-05-09: qty 50

## 2020-05-09 MED ORDER — PANTOPRAZOLE SODIUM 40 MG PO TBEC
40.0000 mg | DELAYED_RELEASE_TABLET | Freq: Two times a day (BID) | ORAL | Status: DC
  Administered 2020-05-09 – 2020-05-12 (×6): 40 mg via ORAL
  Filled 2020-05-09 (×6): qty 1

## 2020-05-09 MED ORDER — AMLODIPINE BESYLATE 10 MG PO TABS
10.0000 mg | ORAL_TABLET | Freq: Every day | ORAL | Status: DC
  Administered 2020-05-10 – 2020-05-12 (×3): 10 mg via ORAL
  Filled 2020-05-09 (×3): qty 1

## 2020-05-09 NOTE — TOC Progression Note (Signed)
Transition of Care [facility]) - Progression Note    Patient Details  Name: Linda Glass MRN: [id number] Date of Birth: May 06, 1933  [facility] CM/SW Contact  Ross Ludwig, [place] Phone Number: 05/09/2020, 5:06 PM  Clinical Narrative:     CSW spoke to patient's daughter via phone, plan is for patient to go to SNF for rehab, then plan to return back home.  Patient is in agreement to plan, awaiting bed offers and insurance authorization.  CSW to continue to follow patient's progress throughout discharge planning.        Expected Discharge Plan and Services                                                 Social Determinants of Health (SDOH) Interventions    Readmission Risk Interventions No flowsheet data found.

## 2020-05-09 NOTE — Progress Notes (Signed)
PROGRESS NOTE    Linda Glass  BPZ:[id number] DOB: 30-Jan-1934 DOA: 05/01/2020 PCP: [name], [name], MD    Chief Complaint  Patient presents with  . abnormal labs    Brief Narrative: 87 year old lady with history of hypertension, osteoarthritis, hyperlipidemia, presented to ED for anemia, generalized weakness and fatigue.  On arrival to ED she was found to have a hemoglobin of 5.4, platelets of 112000.  She underwent 10 units of PRBC transfusion and her repeat hemoglobin is 7.8.  She was also diagnosed recently with new onset A. Fib.  Echocardiogram ordered, showed left ventricular ejection fraction of 60 to 65% with moderate LVH, moderately elevated pulmonary pressure.  She was started on IV Lasix 40 mg twice daily by cardiology.  GI consulted for evaluation of severe anemia.she underwent EGD showing hiatal hernia and non bleeding gastric ulcers. Colonoscopy shows mass at the ileocaecal mass , biopsies done.  GI recommends to hold off anticoagulation until Wednesday ~biopsies from the ileocecal mass comes back. Patient's hemoglobin dropped to 7.4 on 3/13, 1 unit of PRBC transfusion ordered, repeat hemoglobin is around 9.  PT evaluation recommending SNF, TOC made aware. Cardiology signed off.  Pt seen and examined at bedside. No new complaints.  Assessment & Plan:   Principal Problem:   Symptomatic anemia Active Problems:   Class 1 obesity   Hyperlipidemia, mixed   Hypertension, essential, benign   Anxiety disorder   Chronic atrial fibrillation (HCC)   Hypokalemia   2019 novel coronavirus disease (COVID-19)   Pancytopenia (HCC)   Paroxysmal atrial fibrillation (HCC)   Melena   Multiple gastric ulcers   Cecum mass   Severe anemia/severe iron deficiency anemia Stool occult blood is negative, admitted with a hemoglobin of 5.4, underwent a total of  3  units of PRBC transfusion since admission. She will probably need iron infusion prior to discharge.  she underwent EGD  showing 5 cm hiatal hernia. - Non-bleeding gastric ulcers with a clean ulcer base. Colonoscopy shows A polypoid large mass was found at the ileocecal valve. The mass measured five cm in length. No bleeding was present. Biopsies were taken with a cold forceps for histology. Multiple small and large-mouthed diverticula were found in the sigmoid colon and descending colon.  GI recommends to hold off on anti coagulation , until we get the biopsy results back.   No new complaints. No melanotic stools or hematochezia. .     Recheck hemoglobin in am.     Acute diastolic heart failure Fluid overloaded,  BNP elevated on admission. ,  Lasix 40 mg IV bid. Strict intake she has diuresed about 6. L  since admission.  Renal parameters appear to be stable.  Transitioned to oral lasix. She appears compensated.  Appreciate cardiology recommendations.  No new recommendations.   Paroxysmal atrial fibrillation Rate controlled.  Echocardiogram showed left ventricular ejection fraction of 60 to 65% with moderate LVH. Her CHA2DS2-VASc score 4, she will need to be on anticoagulation which is being held for severe anemia and GI bleed. Echocardiogram reviewed and discussed with the patient. Anticoagulation on hold till we get the results of the biopsy.    Hypokalemia, hypomagnesemia Replaced.  Repeat today.     Mild thrombocytopenia of unclear etiology No obvious evidence of bleeding.   Hyperlipidemia continue with Lipitor    Covid 19 infection Patient is currently on room air, denies any shortness of breath or chest pain or cough. Completed the course of remdesivir.   Hypertension Increase norvasc from  5 mg to 10 mg daily and added hydralazine.    DVT prophylaxis: (SCDs Code Status: Full code) Family Communication: (None at bedside disposition:   Status is: Inpatient  The patient will require care spanning > 2 midnights and should be moved to inpatient because: Unsafe d/c plan  Dispo: The  patient is from: Home              Anticipated d/c is to: SNF              Patient currently is not medically stable to d/c.   Difficult to place patient No       Consultants:   Gastroenterology  Cardiology  Procedures: Echo Antimicrobials: None  Subjective: No chest pain or sob, nausea, vomiting.   Objective: Vitals:   05/08/20 2018 05/09/20 0417 05/09/20 0420 05/09/20 1349  BP: (!) 156/73 (!) 169/54  (!) 145/67  Pulse: 60 81  73  Resp: 18 18  16   Temp: 98.5 F (36.9 C) 98.7 F (37.1 C)  98.4 F (36.9 C)  TempSrc: Oral Oral  Oral  SpO2: 94% 93%  96%  Weight:   80.3 kg   Height:        Intake/Output Summary (Last 24 hours) at 05/09/2020 1435 Last data filed at 05/09/2020 1054 Gross per 24 hour  Intake 275 ml  Output 2300 ml  Net -2025 ml   Filed Weights   05/07/20 0620 05/08/20 0500 05/09/20 0420  Weight: 82.1 kg 83.2 kg 80.3 kg    Examination:  General exam: Elderly woman, not in distress, pleasant,  Respiratory system: air entry fair, no wheezing heard, on RA , no tachypnea.  cardiovascular system: S1-S2 heard,irregularly irregular, no JVD,  Gastrointestinal system: Abdomen is soft, NT ND BS+ Central nervous system:Alert and oriented, non focal.  Extremities: pedal edema resolved.  Skin no rashes seen  psychiatry: Mood is appropriate.   Data Reviewed: I have personally reviewed following labs and imaging studies  CBC: Recent Labs  Lab 05/03/20 0332 05/04/20 0358 05/05/20 0351 05/06/20 0426 05/07/20 0416 05/08/20 0320  WBC 8.0 7.7 7.3 5.6 5.5 5.4  NEUTROABS 6.0 5.8 5.6 3.9 3.7  --   HGB 7.6* 7.6* 7.7* 7.7* 7.4* 9.2*  HCT 26.2* 26.6* 27.4* 28.0* 26.9* 31.3*  MCV 73.2* 73.9* 74.9* 76.9* 77.1* 77.9*  PLT 101* 111* 105* 107* 110* 111*    Basic Metabolic Panel: Recent Labs  Lab 05/03/20 0332 05/03/20 1937 05/04/20 0358 05/05/20 0351 05/06/20 0426 05/07/20 0416 05/08/20 0320 05/08/20 1541  NA 136  --  135 138 139  --  137  --   K  2.9*   < > 3.5 3.3* 3.5  --  2.7* 3.3*  CL 100  --  97* 99 102  --  102  --   CO2 25  --  29 28 26   --  26  --   GLUCOSE 93  --  108* 109* 98  --  97  --   BUN 8  --  7* 7* 8  --  10  --   CREATININE 0.44  --  0.58 0.51 0.46  --  0.42*  --   CALCIUM 8.4*  --  8.3* 8.8* 8.8*  --  8.7*  --   MG 1.5*  --  1.9 1.7 1.7 1.5*  --   --   PHOS 3.0  --  3.0 3.8 3.8 4.2  --   --    < > = values in this interval  not displayed.    GFR: Estimated Creatinine Clearance: 51.4 mL/min (A) (by C-G formula based on SCr of 0.42 mg/dL (L)).  Liver Function Tests: Recent Labs  Lab 05/04/20 0358 05/05/20 0351 05/06/20 0426  AST 20 20 19   ALT 16 15 14   ALKPHOS 97 93 87  BILITOT 2.0* 2.1* 1.5*  PROT 5.9* 6.0* 5.7*  ALBUMIN 3.1* 3.0* 2.8*    CBG: No results for input(s): GLUCAP in the last 168 hours.   Recent Results (from the past 240 hour(s))  Resp Panel by RT-PCR (Flu A&B, Covid) Nasopharyngeal Swab     Status: Abnormal   Collection Time: 05/01/20  6:46 PM   Specimen: Nasopharyngeal Swab; Nasopharyngeal(NP) swabs in vial transport medium  Result Value Ref Range Status   SARS Coronavirus 2 by RT PCR POSITIVE (A) NEGATIVE Final    Comment: RESULT CALLED TO, READ BACK BY AND VERIFIED WITH: SMITH J. 03.07.22 @ 2056 BY MECIAL J. (NOTE) SARS-CoV-2 target nucleic acids are DETECTED.  The SARS-CoV-2 RNA is generally detectable in upper respiratory specimens during the acute phase of infection. Positive results are indicative of the presence of the identified virus, but do not rule out bacterial infection or co-infection with other pathogens not detected by the test. Clinical correlation with patient history and other diagnostic information is necessary to determine patient infection status. The expected result is Negative.  Fact Sheet for Patients: EntrepreneurPulse.com.au  Fact Sheet for Healthcare Providers: IncredibleEmployment.be  This test is not yet  approved or cleared by the [place] FDA and  has been authorized for detection and/or diagnosis of SARS-CoV-2 by FDA under an Emergency Use Authorization (EUA).  This EUA will remain in effect (meaning this test ca n be used) for the duration of  the COVID-19 declaration under Section 564(b)(1) of the Act, 21 U.S.C. section 360bbb-3(b)(1), unless the authorization is terminated or revoked sooner.     Influenza A by PCR NEGATIVE NEGATIVE Final   Influenza B by PCR NEGATIVE NEGATIVE Final    Comment: (NOTE) The Xpert Xpress SARS-CoV-2/FLU/RSV plus assay is intended as an aid in the diagnosis of influenza from Nasopharyngeal swab specimens and should not be used as a sole basis for treatment. Nasal washings and aspirates are unacceptable for Xpert Xpress SARS-CoV-2/FLU/RSV testing.  Fact Sheet for Patients: EntrepreneurPulse.com.au  Fact Sheet for Healthcare Providers: IncredibleEmployment.be  This test is not yet approved or cleared by the [place] FDA and has been authorized for detection and/or diagnosis of SARS-CoV-2 by FDA under an Emergency Use Authorization (EUA). This EUA will remain in effect (meaning this test can be used) for the duration of the COVID-19 declaration under Section 564(b)(1) of the Act, 21 U.S.C. section 360bbb-3(b)(1), unless the authorization is terminated or revoked.  Performed at [facility], [place] [address] [place], [place] [address]          Radiology Studies: No results found.      Scheduled Meds: . amLODipine  5 mg Oral Daily  . atorvastatin  40 mg Oral Daily  . citalopram  10 mg Oral Daily  . feeding supplement  1 Container Oral TID BM  . furosemide  40 mg Oral Daily  . magnesium oxide  200 mg Oral Daily  . metoprolol succinate  25 mg Oral Q2000  . pantoprazole  40 mg Oral BID  . sucralfate  1 g Oral TID WC & HS   Continuous Infusions:    LOS: 7 days  Hosie Poisson, MD [facility]   To contact the attending provider between 7A-7P or the covering provider during after hours 7P-7A, please log into the web site [email or web address] and access using universal [place] password for that web site. If you do not have the password, please call the hospital operator.  05/09/2020, 2:35 PM

## 2020-05-09 NOTE — Progress Notes (Addendum)
[facility]   Passed back by briefly to see patient and relay bx results.  Explained that this "mass" is an Adenoma. Explained plan for Dr. Rush Landmark to remove, explained that if he cannot she may need surgical resection. She was very happy to hear it was not cancer.  Ellouise Newer, PA-C

## 2020-05-09 NOTE — Progress Notes (Signed)
Lab called to report critical low potassium of 2.6.  Dr Karleen Hampshire notified via [facility]

## 2020-05-09 NOTE — Telephone Encounter (Signed)
Called patient's son-in-law and discussed results.  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 It may or may not be amenable for removal with EMR, I will request Dr.Mansouraty to review images, if cant be completely removed with EMR, she will need surgical resection.  She is currently still hospitalized. Family will call us to schedule office follow up visit to discuss plan in detail once she is discharged home.  Anderson Malta, can you please inform patient results? Thanks

## 2020-05-09 NOTE — Progress Notes (Signed)
Nutrition Follow-up  DOCUMENTATION CODES:   Obesity unspecified  INTERVENTION:   -Boost Breeze po TID, each supplement provides 250 kcal and 9 grams of protein  NUTRITION DIAGNOSIS:   Increased nutrient needs related to acute illness as evidenced by estimated needs.  Ongoing.  GOAL:   Patient will meet greater than or equal to 90% of their needs  Progressing.  MONITOR:   PO intake,Supplement acceptance,Labs,Weight trends,I & O's  ASSESSMENT:   87 year old lady with history of hypertension, osteoarthritis, hyperlipidemia, presented to ED for anemia, generalized weakness and fatigue. Admitted for severe anemia.  3/11: s/p EGD, colonoscopy: revealed mass at ileocecal valve   Patient currently consuming 90-100% of meals. Accepting [employer], will continue.   Admission weight: 175 lbs. Current weight: 177 lbs  Medications: Lasix, MAG-OX, Carafate  Labs reviewed: Low K  Diet Order:   Diet Order            Diet Heart Room service appropriate? Yes; Fluid consistency: Thin  Diet effective now                 EDUCATION NEEDS:   No education needs have been identified at this time  Skin:  Skin Assessment: Reviewed RN Assessment  Last BM:  3/14  Height:   Ht Readings from Last 1 Encounters:  05/05/20 5\' 2"  (1.575 m)    Weight:   Wt Readings from Last 1 Encounters:  05/09/20 80.3 kg   BMI:  Body mass index is 32.38 kg/m.  Estimated Nutritional Needs:   Kcal:  1600-1800  Protein:  65-80g  Fluid:  1.8L/day  Clayton Bibles, MS, RD, LDN Inpatient Clinical Dietitian Contact information available via Amion

## 2020-05-09 NOTE — Telephone Encounter (Signed)
Happy to.  JLL

## 2020-05-09 NOTE — NC FL2 (Signed)
[place] LEVEL OF CARE SCREENING TOOL     IDENTIFICATION  Patient Name: Linda Glass Birthdate: [birth year] Sex: female Admission Date (Current Location): 05/01/2020  [facility] and [place] Number:  Herbalist and Address:  [facility],  [place], [place]      Provider Number: [id number]  Attending Physician Name and Address:  Hosie Poisson, MD  Relative Name and Phone Number:  Nada Boozer Daughter [phone]    Melynda Keller Relative   [phone]    Current Level of Care: Hospital Recommended Level of Care: [place] Prior Approval Number:    Date Approved/Denied:   PASRR Number: [id number]  Discharge Plan: SNF    Current Diagnoses: Patient Active Problem List   Diagnosis Date Noted  . Multiple gastric ulcers   . Cecum mass   . Melena   . 2019 novel coronavirus disease (COVID-19) 05/02/2020  . Pancytopenia ([place]) 05/02/2020  . Paroxysmal atrial fibrillation (HCC)   . Chronic atrial fibrillation ([place]) 05/01/2020  . Symptomatic anemia 05/01/2020  . Hypokalemia 05/01/2020  . Hypertension, essential, benign 07/05/2019  . Anxiety disorder 07/05/2019  . Insomnia disorder 08/21/2016  . Class 1 obesity 08/21/2016  . Hyperlipidemia, mixed 08/21/2016    Orientation RESPIRATION BLADDER Height & Weight     Self,Time,Place,Situation  Normal Continent Weight: 177 lb 0.5 oz (80.3 kg) Height:  5\' 2"  (157.5 cm)  BEHAVIORAL SYMPTOMS/MOOD NEUROLOGICAL BOWEL NUTRITION STATUS      Continent Diet (Regular)  AMBULATORY STATUS COMMUNICATION OF NEEDS Skin   Limited Assist Verbally Normal                       Personal Care Assistance Level of Assistance  Bathing,Dressing,Feeding Bathing Assistance: Limited assistance Feeding assistance: Independent Dressing Assistance: Limited assistance     Functional Limitations Info  Sight,Speech,Hearing Sight Info: Adequate Hearing Info:  Adequate Speech Info: Adequate    SPECIAL CARE FACTORS FREQUENCY  PT (By licensed PT),OT (By licensed OT)     PT Frequency: Minimum 5x a week OT Frequency: Minimum 5x a week            Contractures      Additional Factors Info  Psychotropic,Isolation Precautions,Allergies,Code Status Code Status Info: Full Code Allergies Info: NKA Psychotropic Info: citalopram (CELEXA) tablet 10 mg   Isolation Precautions Info: Covid + as of 05/01/20     Current Medications (05/09/2020):  This is the current hospital active medication list Current Facility-Administered Medications  Medication Dose Route Frequency Provider Last Rate Last Admin  . acetaminophen (TYLENOL) tablet 650 mg  650 mg Oral Q6H PRN Reubin Milan, MD   650 mg at 05/07/20 1349   Or  . acetaminophen (TYLENOL) suppository 650 mg  650 mg Rectal Q6H PRN Reubin Milan, MD      . Derrill Memo ON 05/10/2020] amLODipine (NORVASC) tablet 10 mg  10 mg Oral Daily Hosie Poisson, MD      . atorvastatin (LIPITOR) tablet 40 mg  40 mg Oral Daily Reubin Milan, MD   40 mg at 05/09/20 4825  . chlorpheniramine-HYDROcodone (TUSSIONEX) 10-8 MG/5ML suspension 5 mL  5 mL Oral Q12H PRN Reubin Milan, MD      . citalopram (CELEXA) tablet 10 mg  10 mg Oral Daily Reubin Milan, MD   10 mg at 05/09/20 0037  . feeding supplement (BOOST / RESOURCE BREEZE) liquid 1 Container  1 Container Oral TID BM Reubin Milan, MD  1 Container at 05/09/20 2297  . furosemide (LASIX) tablet 40 mg  40 mg Oral Daily [name], MD   40 mg at 05/09/20 9892  . guaiFENesin-dextromethorphan (ROBITUSSIN DM) 100-10 MG/5ML syrup 10 mL  10 mL Oral Q4H PRN Reubin Milan, MD      . hydrALAZINE (APRESOLINE) tablet 25 mg  25 mg Oral Q8H PRN Hosie Poisson, MD      . loperamide (IMODIUM) capsule 2 mg  2 mg Oral PRN Hosie Poisson, MD   2 mg at 05/06/20 1017  . LORazepam (ATIVAN) tablet 0.25 mg  0.25 mg Oral Daily PRN Reubin Milan, MD    0.25 mg at 05/08/20 2131  . magnesium oxide (MAG-OX) tablet 200 mg  200 mg Oral Daily Reubin Milan, MD   200 mg at 05/09/20 1194  . metoprolol succinate (TOPROL-XL) 24 hr tablet 25 mg  25 mg Oral Q2000 Reubin Milan, MD   25 mg at 05/08/20 2130  . ondansetron (ZOFRAN) tablet 4 mg  4 mg Oral Q6H PRN Reubin Milan, MD       Or  . ondansetron [facility]) injection 4 mg  4 mg Intravenous Q6H PRN Reubin Milan, MD      . pantoprazole (PROTONIX) EC tablet 40 mg  40 mg Oral BID Hosie Poisson, MD      . sucralfate (CARAFATE) 1 GM/10ML suspension 1 g  1 g Oral TID WC & HS Nandigam, Venia Minks, MD   1 g at 05/09/20 1245     Discharge Medications: Please see discharge summary for a list of discharge medications.  Relevant Imaging Results:  Relevant Lab Results:   Additional Information SSN [id number]  Ross Ludwig, LCSW

## 2020-05-09 NOTE — Progress Notes (Signed)
Physical Therapy Treatment Patient Details Name: Linda Glass MRN: [id number] DOB: [birth year] Today's Date: 05/09/2020    History of Present Illness 87 yo female admitted with anemia, CHF, COVID, gastreoenteritis/diarrhea. Hx of OA, obesity, Afib    PT Comments    The  Patient is progressing well. Ambulated to BR, performed ADL's while standing at sink. Patient plans to go to rehab.    Follow Up Recommendations  SNF     Equipment Recommendations  None recommended by PT    Recommendations for Other Services       Precautions / Restrictions Precautions Precautions: Fall Precaution Comments: airborne; diarrhea    Mobility  Bed Mobility Overal bed mobility: Modified Independent                  Transfers Overall transfer level: Needs assistance   Transfers: Sit to/from Stand Sit to Stand: Min assist         General transfer comment: patient reaching for support of foot board, did not have RW.close guarding  Ambulation/Gait Ambulation/Gait assistance: Min assist Gait Distance (Feet): 15 Feet (x 2)   Gait Pattern/deviations: Step-through pattern;Decreased stride length Gait velocity: decr   General Gait Details: patient reching and supporting self on bed, wall, door, rail, counter. Patient somewhat impulsive, declned RW.   Stairs             Wheelchair Mobility    Modified Rankin (Stroke Patients Only)       Balance Overall balance assessment: Needs assistance Sitting-balance support: No upper extremity supported;Feet supported Sitting balance-Leahy Scale: Good     Standing balance support: Single extremity supported Standing balance-Leahy Scale: Poor Standing balance comment: stood at sink to wsh up,min guard as long as she had 1 UE support.                            Cognition Arousal/Alertness: Awake/alert Behavior During Therapy: WFL for tasks assessed/performed;Impulsive Overall Cognitive Status: Within  Functional Limits for tasks assessed                                        Exercises      General Comments        Pertinent Vitals/Pain Pain Assessment: No/denies pain    Home Living                      Prior Function            PT Goals (current goals can now be found in the care plan section) Progress towards PT goals: Progressing toward goals    Frequency    Min 2X/week      PT Plan Current plan remains appropriate    Co-evaluation              AM-PAC PT "6 Clicks" Mobility   Outcome Measure  Help needed turning from your back to your side while in a flat bed without using bedrails?: None Help needed moving from lying on your back to sitting on the side of a flat bed without using bedrails?: None Help needed moving to and from a bed to a chair (including a wheelchair)?: A Little Help needed standing up from a chair using your arms (e.g., wheelchair or bedside chair)?: A Little Help needed to walk in hospital room?: A Little Help needed climbing 3-5  steps with a railing? : A Little 6 Click Score: 20    End of Session   Activity Tolerance: Patient tolerated treatment well Patient left: in chair;with call bell/phone within reach;with chair alarm set;with family/visitor present Nurse Communication: Mobility status PT Visit Diagnosis: Muscle weakness (generalized) (M62.81);Unsteadiness on feet (R26.81)     Time: 4008-6761 PT Time Calculation (min) (ACUTE ONLY): 20 min  Charges:  $Gait Training: 8-22 mins                     [place] Pager [phone] Office [phone]    Claretha Cooper 05/09/2020, 9:44 AM

## 2020-05-09 NOTE — Telephone Encounter (Signed)
Son in law requesting to speak to you about the results

## 2020-05-09 NOTE — Telephone Encounter (Signed)
Pt's Son in law Delfino Lovett is requesting a call back to the pt or him in regards to the colonoscopy results since the pt's daughter is a Education officer, museum and is not able to answer any calls.

## 2020-05-10 ENCOUNTER — Inpatient Hospital Stay (HOSPITAL_COMMUNITY): Payer: Medicare Other

## 2020-05-10 LAB — URIC ACID: Uric Acid, Serum: 3.3 mg/dL (ref 2.5–7.1)

## 2020-05-10 LAB — CBC
HCT: 32.4 % — ABNORMAL LOW (ref 36.0–46.0)
Hemoglobin: 9.4 g/dL — ABNORMAL LOW (ref 12.0–15.0)
MCH: 22.9 pg — ABNORMAL LOW (ref 26.0–34.0)
MCHC: 29 g/dL — ABNORMAL LOW (ref 30.0–36.0)
MCV: 78.8 fL — ABNORMAL LOW (ref 80.0–100.0)
Platelets: 149 10*3/uL — ABNORMAL LOW (ref 150–400)
RBC: 4.11 MIL/uL (ref 3.87–5.11)
RDW: 25.2 % — ABNORMAL HIGH (ref 11.5–15.5)
WBC: 8.9 10*3/uL (ref 4.0–10.5)
nRBC: 0 % (ref 0.0–0.2)

## 2020-05-10 LAB — BASIC METABOLIC PANEL
Anion gap: 9 (ref 5–15)
BUN: 7 mg/dL — ABNORMAL LOW (ref 8–23)
CO2: 25 mmol/L (ref 22–32)
Calcium: 9 mg/dL (ref 8.9–10.3)
Chloride: 103 mmol/L (ref 98–111)
Creatinine, Ser: 0.4 mg/dL — ABNORMAL LOW (ref 0.44–1.00)
GFR, Estimated: >60 mL/min (ref >60)
Glucose, Bld: 100 mg/dL — ABNORMAL HIGH (ref 70–99)
Potassium: 3.2 mmol/L — ABNORMAL LOW (ref 3.5–5.1)
Sodium: 137 mmol/L (ref 135–145)

## 2020-05-10 MED ORDER — POTASSIUM CHLORIDE CRYS ER 20 MEQ PO TBCR
40.0000 meq | EXTENDED_RELEASE_TABLET | ORAL | Status: AC
  Administered 2020-05-10 (×2): 40 meq via ORAL
  Filled 2020-05-10 (×2): qty 2

## 2020-05-10 NOTE — TOC Progression Note (Addendum)
Transition of Care [facility]) - Progression Note    Patient Details  Name: Tyrea Froberg MRN: [id number] Date of Birth: 1933-12-19  [facility] CM/SW Contact  Ross Ludwig, [place] Phone Number: 05/10/2020, 1:18 PM  Clinical Narrative:    CSW presented bed offers to patient and her son in law, they will review and make a decision.  3:50pm  CSW received phone call from patient's son in law they have decided on [facility].  CSW to begin insurance authorization for SNF.   Expected Discharge Plan: [place]    Expected Discharge Plan and Services Expected Discharge Plan: [place]                                               Social Determinants of Health (SDOH) Interventions    Readmission Risk Interventions No flowsheet data found.

## 2020-05-10 NOTE — Progress Notes (Signed)
Patient noted to have increased swelling to left hand extending into the middle, ring and index fingers.  knuckle of middle finger noted to be reddened as well.  Tylenol administered, hand elevated and ice applied. MD aware.

## 2020-05-10 NOTE — Progress Notes (Signed)
PROGRESS NOTE    Linda Glass  ZES:[id number] DOB: Jan 19, 1934 DOA: 05/01/2020 PCP: [name], [name], MD    Brief Narrative:  Linda Glass is an 87 year old female with past medical history significant for essential hypertension, arthritis, hyperlipidemia, anxiety, class I obesity, chronic atrial fibrillation presented to the emergency department for low hemoglobin level.  Patient states has been feeling very fatigued since last Friday with increased hypersomnolence.  Patient denies abdominal pain, no melena, no hematochezia, no nausea/vomiting/diarrhea, no dysuria, urinary frequency or hematuria.  No chest pain, diaphoresis, dizziness.  In the ED, temperature 97.5 F, HR 77, RR 17, BP 187/73, SPO2 99% on room air.  WBC 3.7, hemoglobin 5.4, platelets 112, COVID-19 PCR positive.  Potassium 3.4, CO2 21, magnesium 1.6.  Patient was started on 2 units PRBC.  Hospital service consulted for further evaluation management of severe anemia.   Assessment & Plan:   Principal Problem:   Symptomatic anemia Active Problems:   Class 1 obesity   Hyperlipidemia, mixed   Hypertension, essential, benign   Anxiety disorder   Chronic atrial fibrillation (HCC)   Hypokalemia   2019 novel coronavirus disease (COVID-19)   Pancytopenia (HCC)   Paroxysmal atrial fibrillation (HCC)   Melena   Multiple gastric ulcers   Cecum mass   Severe iron deficiency anemia Gastric ulcer Tubular adenoma at ileocecal valve Senting with progressive fatigue, found to have a hemoglobin of 5.4.  Iron panel with iron 20, TIBC 521, ferritin 12, folate 15.8, B12 5939.  [facility] was consulted and patient underwent EGD on 05/05/2020 with findings of few nonbleeding cratered gastric ulcers s/p biopsy with pathology consistent with focal intestinal metaplasia and negative for H. pylori without dysplasia or malignancy.  Colonoscopy with findings of a polypoid large mass ileocecal valve 5 cm in length without  bleeding s/p biopsies with pathology consistent with tubular adenoma with no high-grade dysplasia or malignancy.  Completed transfusion total 4 units during hospitalization. --Protonix 40 mg p.o. twice daily x3 months followed by once daily --Carafate 1 g TIDAC and HS x 4 weeks --Outpatient follow-up with gastroenterology, Dr. Rush Landmark for tubular adenoma resection; follow-up scheduled on 06/05/2020 at 10 AM --Hgb 9.4, stable  Acute on chronic diastolic congestive heart failure TTE with LVEF 60-65% with moderate LVH, left/right atria mildly dilated. --net negative 7.0L since admission --Furosemide 40 mg p.o. daily --Succinate 25 mg p.o. daily --Continue statin --Outpatient follow-up with cardiology  Paroxysmal atrial fibrillation TTE with LVEF 60-65%, moderate LVH.  TSH within normal limits.  Essential hypertension --Amlodipine 10 mg p.o. daily --Furosemide 40 mg p.o. daily --Metoprolol succinate 25 mg p.o. daily  Hypokalemia Hypomagnesemia K 3.2, will replete today. --Repeat electrolytes in a.m. to include magnesium  Hyperlipidemia: Continue atorvastatin 40 mg p.o. daily  Covid-19 viral infection Completed course of remdesivir.  Oxygenating well on room air.  Weakness/deconditioning/debility. --PT/OT following. --Pending SNF placement  Left hand swelling Unclear etiology, no recent IVs in left upper extremity.  Patient with edema, erythema to PIP joints.  No history of gout.  Uric acid 3.3, within normal limits.  X-ray left hand with no acute osseous abnormality with scattered osteoarthritis.    DVT prophylaxis: SCDs no family present at bedside this morning   Code Status: Full Code Family Communication:   Disposition Plan:  Level of care: Telemetry Status is: Inpatient  Remains inpatient appropriate because:Unsafe d/c plan   Dispo: The patient is from: Home  Anticipated d/c is to: SNF              Patient currently is not medically stable to  d/c.   Difficult to place patient No   Consultants:   Cardiology  [facility]  Procedures:   EGD  Colonoscopy  Antimicrobials:   None   Subjective: Patient seen and examined bedside, resting comfortably.  Complaining of left hand/finger swelling.  No other specific complaints this morning.  Awaiting SNF placement.  Denies headache, no fever/chills/night sweats, no nausea/vomiting/diarrhea, no chest pain, no palpitations, no shortness of breath, no abdominal pain, no weakness, fatigue, no paresthesias.  No acute events overnight per nursing staff.  Objective: Vitals:   05/09/20 1349 05/09/20 2145 05/10/20 0500 05/10/20 1405  BP: (!) 145/67 (!) 158/52  (!) 167/69  Pulse: 73 62  (!) 58  Resp: 16 18  16   Temp: 98.4 F (36.9 C) 98.3 F (36.8 C)  97.6 F (36.4 C)  TempSrc: Oral Oral  Oral  SpO2: 96% 95%  100%  Weight:   82.4 kg   Height:        Intake/Output Summary (Last 24 hours) at 05/10/2020 1601 Last data filed at 05/10/2020 0600 Gross per 24 hour  Intake 167.58 ml  Output --  Net 167.58 ml   Filed Weights   05/08/20 0500 05/09/20 0420 05/10/20 0500  Weight: 83.2 kg 80.3 kg 82.4 kg    Examination:  General exam: Appears calm and comfortable  Respiratory system: Clear to auscultation. Respiratory effort normal.  Oxygenating well on room air Cardiovascular system: S1 & S2 heard, RRR. No JVD, murmurs, rubs, gallops or clicks. No pedal edema. Gastrointestinal system: Abdomen is nondistended, soft and nontender. No organomegaly or masses felt. Normal bowel sounds heard. Central nervous system: Alert and oriented. No focal neurological deficits. Extremities: Symmetric 5 x 5 power.  Left hand with edema to fingers with erythema to PIP joints Skin: No rashes, lesions or ulcers Psychiatry: Judgement and insight appear normal. Mood & affect appropriate.     Data Reviewed: I have personally reviewed following labs and imaging studies  CBC: Recent Labs  Lab  05/04/20 0358 05/05/20 0351 05/06/20 0426 05/07/20 0416 05/08/20 0320 05/09/20 1536 05/10/20 0351  WBC 7.7 7.3 5.6 5.5 5.4 9.3 8.9  NEUTROABS 5.8 5.6 3.9 3.7  --  7.3  --   HGB 7.6* 7.7* 7.7* 7.4* 9.2* 9.6* 9.4*  HCT 26.6* 27.4* 28.0* 26.9* 31.3* 32.2* 32.4*  MCV 73.9* 74.9* 76.9* 77.1* 77.9* 77.6* 78.8*  PLT 111* 105* 107* 110* 111* 148* 884*   Basic Metabolic Panel: Recent Labs  Lab 05/04/20 0358 05/05/20 0351 05/06/20 0426 05/07/20 0416 05/08/20 0320 05/08/20 1541 05/09/20 1536 05/10/20 0351  NA 135 138 139  --  137  --  136 137  K 3.5 3.3* 3.5  --  2.7* 3.3* 2.6* 3.2*  CL 97* 99 102  --  102  --  101 103  CO2 29 28 26   --  26  --  25 25  GLUCOSE 108* 109* 98  --  97  --  90 100*  BUN 7* 7* 8  --  10  --  9 7*  CREATININE 0.58 0.51 0.46  --  0.42*  --  0.51 0.40*  CALCIUM 8.3* 8.8* 8.8*  --  8.7*  --  8.7* 9.0  MG 1.9 1.7 1.7 1.5*  --   --  1.5*  --   PHOS 3.0 3.8 3.8 4.2  --   --   --   --  GFR: Estimated Creatinine Clearance: 52.1 mL/min (A) (by C-G formula based on SCr of 0.4 mg/dL (L)). Liver Function Tests: Recent Labs  Lab 05/04/20 0358 05/05/20 0351 05/06/20 0426  AST 20 20 19   ALT 16 15 14   ALKPHOS 97 93 87  BILITOT 2.0* 2.1* 1.5*  PROT 5.9* 6.0* 5.7*  ALBUMIN 3.1* 3.0* 2.8*   No results for input(s): LIPASE, AMYLASE in the last 168 hours. No results for input(s): AMMONIA in the last 168 hours. Coagulation Profile: No results for input(s): INR, PROTIME in the last 168 hours. Cardiac Enzymes: No results for input(s): CKTOTAL, CKMB, CKMBINDEX, TROPONINI in the last 168 hours. BNP (last 3 results) Recent Labs    05/01/20 1500  PROBNP 599.0*   HbA1C: No results for input(s): HGBA1C in the last 72 hours. CBG: No results for input(s): GLUCAP in the last 168 hours. Lipid Profile: No results for input(s): CHOL, HDL, LDLCALC, TRIG, CHOLHDL, LDLDIRECT in the last 72 hours. Thyroid Function Tests: No results for input(s): TSH, T4TOTAL, FREET4,  T3FREE, THYROIDAB in the last 72 hours. Anemia Panel: No results for input(s): VITAMINB12, FOLATE, FERRITIN, TIBC, IRON, RETICCTPCT in the last 72 hours. Sepsis Labs: No results for input(s): PROCALCITON, LATICACIDVEN in the last 168 hours.  Recent Results (from the past 240 hour(s))  Resp Panel by RT-PCR (Flu A&B, Covid) Nasopharyngeal Swab     Status: Abnormal   Collection Time: 05/01/20  6:46 PM   Specimen: Nasopharyngeal Swab; Nasopharyngeal(NP) swabs in vial transport medium  Result Value Ref Range Status   SARS Coronavirus 2 by RT PCR POSITIVE (A) NEGATIVE Final    Comment: RESULT CALLED TO, READ BACK BY AND VERIFIED WITH: SMITH J. 03.07.22 @ 2056 BY MECIAL J. (NOTE) SARS-CoV-2 target nucleic acids are DETECTED.  The SARS-CoV-2 RNA is generally detectable in upper respiratory specimens during the acute phase of infection. Positive results are indicative of the presence of the identified virus, but do not rule out bacterial infection or co-infection with other pathogens not detected by the test. Clinical correlation with patient history and other diagnostic information is necessary to determine patient infection status. The expected result is Negative.  Fact Sheet for Patients: EntrepreneurPulse.com.au  Fact Sheet for Healthcare Providers: IncredibleEmployment.be  This test is not yet approved or cleared by the [place] FDA and  has been authorized for detection and/or diagnosis of SARS-CoV-2 by FDA under an Emergency Use Authorization (EUA).  This EUA will remain in effect (meaning this test ca n be used) for the duration of  the COVID-19 declaration under Section 564(b)(1) of the Act, 21 U.S.C. section 360bbb-3(b)(1), unless the authorization is terminated or revoked sooner.     Influenza A by PCR NEGATIVE NEGATIVE Final   Influenza B by PCR NEGATIVE NEGATIVE Final    Comment: (NOTE) The Xpert Xpress SARS-CoV-2/FLU/RSV plus  assay is intended as an aid in the diagnosis of influenza from Nasopharyngeal swab specimens and should not be used as a sole basis for treatment. Nasal washings and aspirates are unacceptable for Xpert Xpress SARS-CoV-2/FLU/RSV testing.  Fact Sheet for Patients: EntrepreneurPulse.com.au  Fact Sheet for Healthcare Providers: IncredibleEmployment.be  This test is not yet approved or cleared by the [place] FDA and has been authorized for detection and/or diagnosis of SARS-CoV-2 by FDA under an Emergency Use Authorization (EUA). This EUA will remain in effect (meaning this test can be used) for the duration of the COVID-19 declaration under Section 564(b)(1) of the Act, 21 U.S.C. section 360bbb-3(b)(1), unless the authorization  is terminated or revoked.  Performed at [facility], [place] [address] [place], [place] [address]          Radiology Studies: DG Hand Complete Left  Result Date: 05/10/2020 CLINICAL DATA:  Left hand pain and swelling. EXAM: LEFT HAND - COMPLETE 3+ VIEW COMPARISON:  None. FINDINGS: No acute fracture or dislocation. Mild radioscaphoid and second through fifth MCP joint space narrowing. Moderate first CMC joint and diffuse IP joint osteoarthritis. Osteopenia. Soft tissues are unremarkable. IMPRESSION: 1.  No acute osseous abnormality. 2. Scattered osteoarthritis as described above. Electronically Signed   By: Titus Dubin M.D.   On: 05/10/2020 14:50        Scheduled Meds: . amLODipine  10 mg Oral Daily  . atorvastatin  40 mg Oral Daily  . citalopram  10 mg Oral Daily  . feeding supplement  1 Container Oral TID BM  . furosemide  40 mg Oral Daily  . magnesium oxide  200 mg Oral Daily  . metoprolol succinate  25 mg Oral Q2000  . pantoprazole  40 mg Oral BID  . sucralfate  1 g Oral TID WC & HS   Continuous Infusions:   LOS: 8 days    Time spent: 39 minutes spent on chart review,  discussion with nursing staff, consultants, updating family and interview/physical exam; more than 50% of that time was spent in counseling and/or coordination of care.    Ura Hausen J [place] DO [facility] Available via Epic secure chat 7am-7pm After these hours, please refer to coverage provider listed on [email or web address] 05/10/2020, 4:01 PM

## 2020-05-11 LAB — BASIC METABOLIC PANEL
Anion gap: 8 (ref 5–15)
BUN: 9 mg/dL (ref 8–23)
CO2: 26 mmol/L (ref 22–32)
Calcium: 9.2 mg/dL (ref 8.9–10.3)
Chloride: 105 mmol/L (ref 98–111)
Creatinine, Ser: 0.46 mg/dL (ref 0.44–1.00)
GFR, Estimated: >60 mL/min (ref >60)
Glucose, Bld: 90 mg/dL (ref 70–99)
Potassium: 3.9 mmol/L (ref 3.5–5.1)
Sodium: 139 mmol/L (ref 135–145)

## 2020-05-11 LAB — CBC
HCT: 31.3 % — ABNORMAL LOW (ref 36.0–46.0)
Hemoglobin: 9.1 g/dL — ABNORMAL LOW (ref 12.0–15.0)
MCH: 22.6 pg — ABNORMAL LOW (ref 26.0–34.0)
MCHC: 29.1 g/dL — ABNORMAL LOW (ref 30.0–36.0)
MCV: 77.7 fL — ABNORMAL LOW (ref 80.0–100.0)
Platelets: 113 10*3/uL — ABNORMAL LOW (ref 150–400)
RBC: 4.03 MIL/uL (ref 3.87–5.11)
RDW: 25.4 % — ABNORMAL HIGH (ref 11.5–15.5)
WBC: 5.5 10*3/uL (ref 4.0–10.5)
nRBC: 0 % (ref 0.0–0.2)

## 2020-05-11 LAB — MAGNESIUM: Magnesium: 1.5 mg/dL — ABNORMAL LOW (ref 1.7–2.4)

## 2020-05-11 LAB — SEDIMENTATION RATE: Sed Rate: 22 mm/hr (ref 0–22)

## 2020-05-11 LAB — C-REACTIVE PROTEIN: CRP: 7.2 mg/dL — ABNORMAL HIGH (ref <1.0)

## 2020-05-11 MED ORDER — LOSARTAN POTASSIUM 25 MG PO TABS
25.0000 mg | ORAL_TABLET | Freq: Every day | ORAL | Status: DC
  Administered 2020-05-11: 25 mg via ORAL
  Filled 2020-05-11: qty 1

## 2020-05-11 NOTE — Progress Notes (Signed)
Physical Therapy Treatment Patient Details Name: Linda Glass MRN: [id number] DOB: October 16, 1933 Today's Date: 05/11/2020    History of Present Illness 87 yo female admitted with anemia, CHF, COVID, gastreoenteritis/diarrhea. Hx of OA, obesity, Afib    PT Comments    Pt self able to get in/OOB to near by [facility] present with freq loose stools.  Assisted with amb to and from bathroom with a very unsteady gait.  Pt declines need for a walker so performed a BERG balance test which pt scored low 31/56 indicating HIGH FALL RISK and need for AD.   Follow Up Recommendations  SNF     Equipment Recommendations  None recommended by PT    Recommendations for Other Services       Precautions / Restrictions Precautions Precautions: Fall Precaution Comments: none    Mobility  Bed Mobility Overal bed mobility: Modified Independent             General bed mobility comments: self able    Transfers Overall transfer level: Needs assistance Equipment used: None;Rolling walker (2 wheeled) Transfers: Sit to/from [employer] Transfers Sit to Stand: Supervision;Min guard Stand pivot transfers: Min guard;Min assist       General transfer comment: unsteady, requires need for hand palcement  Ambulation/Gait Ambulation/Gait assistance: Min assist Gait Distance (Feet): 12 Feet Assistive device: Rolling walker (2 wheeled);1 person hand held assist Gait Pattern/deviations: Step-through pattern;Decreased stride length Gait velocity: decr   General Gait Details: unsteady, required 25% VC's on safety esp with turns.  Pt declined need for a walker but present with balance instability.   Stairs             Wheelchair Mobility    Modified Rankin (Stroke Patients Only)       Balance Overall balance assessment: Needs assistance           Standing balance-Leahy Scale: Poor                   Standardized Balance Assessment Standardized Balance  Assessment : Berg Balance Test Berg Balance Test Sit to Stand: Able to stand  independently using hands Standing Unsupported: Able to stand 2 minutes with supervision Sitting with Back Unsupported but Feet Supported on Floor or Stool: Able to sit safely and securely 2 minutes Stand to Sit: Controls descent by using hands Transfers: Able to transfer safely, definite need of hands Standing Unsupported with Eyes Closed: Able to stand 10 seconds with supervision Standing Ubsupported with Feet Together: Needs help to attain position but able to stand for 30 seconds with feet together From Standing, Reach Forward with Outstretched Arm: Can reach forward >12 cm safely (5") From Standing Position, Pick up Object from Floor: Able to pick up shoe, needs supervision From Standing Position, Turn to Look Behind Over each Shoulder: Looks behind from both sides and weight shifts well Turn 360 Degrees: Needs close supervision or verbal cueing Standing Unsupported, Alternately Place Feet on Step/Stool: Needs assistance to keep from falling or unable to try Standing Unsupported, One Foot in Front: Loses balance while stepping or standing Standing on One Leg: Unable to try or needs assist to prevent fall Total Score: 31/56 indicated  100% fall Risk and need for AD         Cognition Arousal/Alertness: Awake/alert Behavior During Therapy: [facility] for tasks assessed/performed;Impulsive Overall Cognitive Status: Within Functional Limits for tasks assessed  General Comments: AxO x 3 very sweet      Exercises      General Comments General comments (skin integrity, edema, etc.): BERG balance score 31/56 indicates 100% fall risk and need for AD      Pertinent Vitals/Pain Pain Assessment: No/denies pain    Home Living                      Prior Function            PT Goals (current goals can now be found in the care plan section) Progress towards PT  goals: Progressing toward goals    Frequency    Min 2X/week      PT Plan Current plan remains appropriate    Co-evaluation              AM-PAC PT "6 Clicks" Mobility   Outcome Measure  Help needed turning from your back to your side while in a flat bed without using bedrails?: None Help needed moving from lying on your back to sitting on the side of a flat bed without using bedrails?: None Help needed moving to and from a bed to a chair (including a wheelchair)?: A Little Help needed standing up from a chair using your arms (e.g., wheelchair or bedside chair)?: A Little Help needed to walk in hospital room?: A Little Help needed climbing 3-5 steps with a railing? : A Little 6 Click Score: 20    End of Session Equipment Utilized During Treatment: Gait belt Activity Tolerance: Patient limited by fatigue Patient left: in chair;with call bell/phone within reach;with chair alarm set;with family/visitor present Nurse Communication: Mobility status PT Visit Diagnosis: Muscle weakness (generalized) (M62.81);Unsteadiness on feet (R26.81)     Time: 1000-1020 PT Time Calculation (min) (ACUTE ONLY): 20 min  Charges:  $Gait Training: 8-22 mins                     Rica Koyanagi  PTA Acute  Rehabilitation Services Pager      [phone] Office      [phone]

## 2020-05-11 NOTE — Care Management Important Message (Signed)
Important Message  Patient Details IM Letter placed in Patient's door caddy. Name: Linda Glass MRN: [id number] Date of Birth: August 26, 1933   Medicare Important Message Given:  Yes     Kerin Salen 05/11/2020, 10:42 AM

## 2020-05-11 NOTE — TOC Progression Note (Addendum)
Transition of Care [facility]) - Progression Note    Patient Details  Name: Linda Glass MRN: [id number] Date of Birth: 1933/11/02  [facility] CM/SW Contact  Ross Ludwig,  Phone Number: 05/11/2020, 8:51 AM  Clinical Narrative:     Insurance authorization started for [employer].  Reference number is [id number].  CSW updated [employer] with authorization number.  2:00pm CSW received phone call from [employer], she has been approved for SNF placement at [facility] with a start date of tomorrow.  CSW updated attending physician and patient's son in law.   Expected Discharge Plan: [place]    Expected Discharge Plan and Services Expected Discharge Plan: [place]                                               Social Determinants of Health (SDOH) Interventions    Readmission Risk Interventions No flowsheet data found.

## 2020-05-11 NOTE — Progress Notes (Signed)
PROGRESS NOTE    Linda Glass  EVO:[id number] DOB: May 17, 1933 DOA: 05/01/2020 PCP: [name], [name], MD    Brief Narrative:  Linda Glass is an 87 year old female with past medical history significant for essential hypertension, arthritis, hyperlipidemia, anxiety, class I obesity, chronic atrial fibrillation presented to the emergency department for low hemoglobin level.  Patient states has been feeling very fatigued since last Friday with increased hypersomnolence.  Patient denies abdominal pain, no melena, no hematochezia, no nausea/vomiting/diarrhea, no dysuria, urinary frequency or hematuria.  No chest pain, diaphoresis, dizziness.  In the ED, temperature 97.5 F, HR 77, RR 17, BP 187/73, SPO2 99% on room air.  WBC 3.7, hemoglobin 5.4, platelets 112, COVID-19 PCR positive.  Potassium 3.4, CO2 21, magnesium 1.6.  Patient was started on 2 units PRBC.  Hospital service consulted for further evaluation management of severe anemia.   Assessment & Plan:   Principal Problem:   Symptomatic anemia Active Problems:   Class 1 obesity   Hyperlipidemia, mixed   Hypertension, essential, benign   Anxiety disorder   Chronic atrial fibrillation (HCC)   Hypokalemia   2019 novel coronavirus disease (COVID-19)   Pancytopenia (HCC)   Paroxysmal atrial fibrillation (HCC)   Melena   Multiple gastric ulcers   Cecum mass   Severe iron deficiency anemia Gastric ulcer Tubular adenoma at ileocecal valve Senting with progressive fatigue, found to have a hemoglobin of 5.4.  Iron panel with iron 20, TIBC 521, ferritin 12, folate 15.8, B12 5939.  [facility] was consulted and patient underwent EGD on 05/05/2020 with findings of few nonbleeding cratered gastric ulcers s/p biopsy with pathology consistent with focal intestinal metaplasia and negative for H. pylori without dysplasia or malignancy.  Colonoscopy with findings of a polypoid large mass ileocecal valve 5 cm in length without  bleeding s/p biopsies with pathology consistent with tubular adenoma with no high-grade dysplasia or malignancy.  Completed transfusion total 4 units during hospitalization. --Protonix 40 mg p.o. twice daily x3 months followed by once daily --Carafate 1 g TIDAC and HS x 4 weeks --Outpatient follow-up with gastroenterology, Dr. Rush Landmark for tubular adenoma resection; follow-up scheduled on 06/05/2020 at 10 AM --Hgb 9.4, stable  Acute on chronic diastolic congestive heart failure TTE with LVEF 60-65% with moderate LVH, left/right atria mildly dilated. --Furosemide 40 mg p.o. daily --Metoprolol Succinate 25 mg p.o. daily --Losartan 25 mg p.o. daily --Continue statin --Outpatient follow-up with cardiology  Paroxysmal atrial fibrillation TTE with LVEF 60-65%, moderate LVH.  TSH within normal limits.  Essential hypertension --Amlodipine 10 mg p.o. daily --Furosemide 40 mg p.o. daily --Losartan 25 mg p.o. daily --Metoprolol succinate 25 mg p.o. daily  Hypokalemia Hypomagnesemia K 3.2, will replete today. --Repeat electrolytes in a.m. to include magnesium  Hyperlipidemia: Continue atorvastatin 40 mg p.o. daily  Covid-19 viral infection Completed course of remdesivir.  Oxygenating well on room air.  Weakness/deconditioning/debility. --PT/OT following. --Pending SNF placement  Left hand swelling: improving Unclear etiology, no recent IVs in left upper extremity.  Patient with edema, erythema to PIP joints.  No history of gout.  Uric acid 3.3, within normal limits.  X-ray left hand with no acute osseous abnormality with scattered osteoarthritis. --ice pack prn    DVT prophylaxis: SCDs    Code Status: Full Code Family Communication: no family present at bedside this morning  Disposition Plan:  Level of care: Telemetry Status is: Inpatient  Remains inpatient appropriate because:Unsafe d/c plan   Dispo: The patient is from: Home  Anticipated d/c is to: SNF               Patient currently is medically stable to d/c.   Difficult to place patient No   Consultants:   Cardiology  [facility]  Procedures:   EGD  Colonoscopy  Antimicrobials:   None   Subjective: Patient seen and examined bedside, resting comfortably.  Pending SNF placement, awaiting insurance authorization.  Denies headache, no fever/chills/night sweats, no nausea/vomiting/diarrhea, no chest pain, no palpitations, no shortness of breath, no abdominal pain, no weakness, fatigue, no paresthesias.  No acute events overnight per nursing staff.  Objective: Vitals:   05/10/20 2052 05/11/20 0500 05/11/20 0510 05/11/20 1259  BP: (!) 182/96  (!) 171/72 (!) 144/65  Pulse: 67  73 71  Resp: 20  19 16   Temp: 97.7 F (36.5 C)  97.8 F (36.6 C) 98.3 F (36.8 C)  TempSrc: Oral  Oral Oral  SpO2: 99%  99% 94%  Weight:  80.2 kg    Height:        Intake/Output Summary (Last 24 hours) at 05/11/2020 1501 Last data filed at 05/11/2020 1254 Gross per 24 hour  Intake 960 ml  Output --  Net 960 ml   Filed Weights   05/09/20 0420 05/10/20 0500 05/11/20 0500  Weight: 80.3 kg 82.4 kg 80.2 kg    Examination:  General exam: Appears calm and comfortable  Respiratory system: Clear to auscultation. Respiratory effort normal.  Oxygenating well on room air Cardiovascular system: S1 & S2 heard, RRR. No JVD, murmurs, rubs, gallops or clicks. No pedal edema. Gastrointestinal system: Abdomen is nondistended, soft and nontender. No organomegaly or masses felt. Normal bowel sounds heard. Central nervous system: Alert and oriented. No focal neurological deficits. Extremities: Symmetric 5 x 5 power.  Left hand with edema to fingers with erythema to PIP joints Skin: No rashes, lesions or ulcers Psychiatry: Judgement and insight appear normal. Mood & affect appropriate.     Data Reviewed: I have personally reviewed following labs and imaging studies  CBC: Recent Labs  Lab 05/05/20 0351  05/06/20 0426 05/07/20 0416 05/08/20 0320 05/09/20 1536 05/10/20 0351 05/11/20 0343  WBC 7.3 5.6 5.5 5.4 9.3 8.9 5.5  NEUTROABS 5.6 3.9 3.7  --  7.3  --   --   HGB 7.7* 7.7* 7.4* 9.2* 9.6* 9.4* 9.1*  HCT 27.4* 28.0* 26.9* 31.3* 32.2* 32.4* 31.3*  MCV 74.9* 76.9* 77.1* 77.9* 77.6* 78.8* 77.7*  PLT 105* 107* 110* 111* 148* 149* 144*   Basic Metabolic Panel: Recent Labs  Lab 05/05/20 0351 05/06/20 0426 05/07/20 0416 05/08/20 0320 05/08/20 1541 05/09/20 1536 05/10/20 0351 05/11/20 0343  NA 138 139  --  137  --  136 137 139  K 3.3* 3.5  --  2.7* 3.3* 2.6* 3.2* 3.9  CL 99 102  --  102  --  101 103 105  CO2 28 26  --  26  --  25 25 26   GLUCOSE 109* 98  --  97  --  90 100* 90  BUN 7* 8  --  10  --  9 7* 9  CREATININE 0.51 0.46  --  0.42*  --  0.51 0.40* 0.46  CALCIUM 8.8* 8.8*  --  8.7*  --  8.7* 9.0 9.2  MG 1.7 1.7 1.5*  --   --  1.5*  --  1.5*  PHOS 3.8 3.8 4.2  --   --   --   --   --  GFR: Estimated Creatinine Clearance: 51.3 mL/min (by C-G formula based on SCr of 0.46 mg/dL). Liver Function Tests: Recent Labs  Lab 05/05/20 0351 05/06/20 0426  AST 20 19  ALT 15 14  ALKPHOS 93 87  BILITOT 2.1* 1.5*  PROT 6.0* 5.7*  ALBUMIN 3.0* 2.8*   No results for input(s): LIPASE, AMYLASE in the last 168 hours. No results for input(s): AMMONIA in the last 168 hours. Coagulation Profile: No results for input(s): INR, PROTIME in the last 168 hours. Cardiac Enzymes: No results for input(s): CKTOTAL, CKMB, CKMBINDEX, TROPONINI in the last 168 hours. BNP (last 3 results) Recent Labs    05/01/20 1500  PROBNP 599.0*   HbA1C: No results for input(s): HGBA1C in the last 72 hours. CBG: No results for input(s): GLUCAP in the last 168 hours. Lipid Profile: No results for input(s): CHOL, HDL, LDLCALC, TRIG, CHOLHDL, LDLDIRECT in the last 72 hours. Thyroid Function Tests: No results for input(s): TSH, T4TOTAL, FREET4, T3FREE, THYROIDAB in the last 72 hours. Anemia Panel: No  results for input(s): VITAMINB12, FOLATE, FERRITIN, TIBC, IRON, RETICCTPCT in the last 72 hours. Sepsis Labs: No results for input(s): PROCALCITON, LATICACIDVEN in the last 168 hours.  Recent Results (from the past 240 hour(s))  Resp Panel by RT-PCR (Flu A&B, Covid) Nasopharyngeal Swab     Status: Abnormal   Collection Time: 05/01/20  6:46 PM   Specimen: Nasopharyngeal Swab; Nasopharyngeal(NP) swabs in vial transport medium  Result Value Ref Range Status   SARS Coronavirus 2 by RT PCR POSITIVE (A) NEGATIVE Final    Comment: RESULT CALLED TO, READ BACK BY AND VERIFIED WITH: SMITH J. 03.07.22 @ 2056 BY MECIAL J. (NOTE) SARS-CoV-2 target nucleic acids are DETECTED.  The SARS-CoV-2 RNA is generally detectable in upper respiratory specimens during the acute phase of infection. Positive results are indicative of the presence of the identified virus, but do not rule out bacterial infection or co-infection with other pathogens not detected by the test. Clinical correlation with patient history and other diagnostic information is necessary to determine patient infection status. The expected result is Negative.  Fact Sheet for Patients: EntrepreneurPulse.com.au  Fact Sheet for Healthcare Providers: IncredibleEmployment.be  This test is not yet approved or cleared by the [place] FDA and  has been authorized for detection and/or diagnosis of SARS-CoV-2 by FDA under an Emergency Use Authorization (EUA).  This EUA will remain in effect (meaning this test ca n be used) for the duration of  the COVID-19 declaration under Section 564(b)(1) of the Act, 21 U.S.C. section 360bbb-3(b)(1), unless the authorization is terminated or revoked sooner.     Influenza A by PCR NEGATIVE NEGATIVE Final   Influenza B by PCR NEGATIVE NEGATIVE Final    Comment: (NOTE) The Xpert Xpress SARS-CoV-2/FLU/RSV plus assay is intended as an aid in the diagnosis of influenza  from Nasopharyngeal swab specimens and should not be used as a sole basis for treatment. Nasal washings and aspirates are unacceptable for Xpert Xpress SARS-CoV-2/FLU/RSV testing.  Fact Sheet for Patients: EntrepreneurPulse.com.au  Fact Sheet for Healthcare Providers: IncredibleEmployment.be  This test is not yet approved or cleared by the [place] FDA and has been authorized for detection and/or diagnosis of SARS-CoV-2 by FDA under an Emergency Use Authorization (EUA). This EUA will remain in effect (meaning this test can be used) for the duration of the COVID-19 declaration under Section 564(b)(1) of the Act, 21 U.S.C. section 360bbb-3(b)(1), unless the authorization is terminated or revoked.  Performed at [facility], [place] [address] [place], [place] [address]          Radiology Studies: DG Hand Complete Left  Result Date: 05/10/2020 CLINICAL DATA:  Left hand pain and swelling. EXAM: LEFT HAND - COMPLETE 3+ VIEW COMPARISON:  None. FINDINGS: No acute fracture or dislocation. Mild radioscaphoid and second through fifth MCP joint space narrowing. Moderate first CMC joint and diffuse IP joint osteoarthritis. Osteopenia. Soft tissues are unremarkable. IMPRESSION: 1.  No acute osseous abnormality. 2. Scattered osteoarthritis as described above. Electronically Signed   By: Titus Dubin M.D.   On: 05/10/2020 14:50        Scheduled Meds: . amLODipine  10 mg Oral Daily  . atorvastatin  40 mg Oral Daily  . citalopram  10 mg Oral Daily  . feeding supplement  1 Container Oral TID BM  . furosemide  40 mg Oral Daily  . losartan  25 mg Oral Daily  . magnesium oxide  200 mg Oral Daily  . metoprolol succinate  25 mg Oral Q2000  . pantoprazole  40 mg Oral BID  . sucralfate  1 g Oral TID WC & HS   Continuous Infusions:   LOS: 9 days    Time spent: 39 minutes spent on chart review, discussion with nursing staff,  consultants, updating family and interview/physical exam; more than 50% of that time was spent in counseling and/or coordination of care.    Ramey Ketcherside J [place] DO [facility] Available via Epic secure chat 7am-7pm After these hours, please refer to coverage provider listed on [email or web address] 05/11/2020, 3:01 PM

## 2020-05-12 MED ORDER — FUROSEMIDE 40 MG PO TABS: 40.0000 mg | ORAL_TABLET | Freq: Every day | ORAL | Status: DC

## 2020-05-12 MED ORDER — LOSARTAN POTASSIUM 50 MG PO TABS
50.0000 mg | ORAL_TABLET | Freq: Every day | ORAL | Status: DC
  Administered 2020-05-12: 50 mg via ORAL
  Filled 2020-05-12: qty 1

## 2020-05-12 MED ORDER — SUCRALFATE 1 GM/10ML PO SUSP: 1.0000 g | Freq: Three times a day (TID) | ORAL | 0 refills | Status: DC

## 2020-05-12 MED ORDER — MAGNESIUM OXIDE 400 (241.3 MG) MG PO TABS: 200.0000 mg | ORAL_TABLET | Freq: Every day | ORAL | Status: DC

## 2020-05-12 MED ORDER — LOSARTAN POTASSIUM 50 MG PO TABS: 50.0000 mg | ORAL_TABLET | Freq: Every day | ORAL | Status: DC

## 2020-05-12 MED ORDER — PANTOPRAZOLE SODIUM 40 MG PO TBEC: DELAYED_RELEASE_TABLET | ORAL | 0 refills | Status: DC

## 2020-05-12 MED ORDER — AMLODIPINE BESYLATE 10 MG PO TABS: 10.0000 mg | ORAL_TABLET | Freq: Every day | ORAL | Status: DC

## 2020-05-12 MED ORDER — LORAZEPAM 0.5 MG PO TABS: 0.2500 mg | ORAL_TABLET | Freq: Every day | ORAL | 0 refills | Status: DC | PRN

## 2020-05-12 NOTE — Discharge Summary (Signed)
Physician Discharge Summary  Linda Glass YTK:[id number] DOB: [birth year] DOA: 05/01/2020  PCP: [name], [name], MD  Admit date: 05/01/2020 Discharge date: 05/12/2020  Admitted From: Home Disposition: [facility]  Recommendations for Outpatient Follow-up:  1. Follow up with PCP in 1-2 weeks 2. Follow-up with [facility] as scheduled on 06/05/2020 at 10 AM 3. Continue Protonix 40 mg p.o. twice daily x3 months followed by once daily dosing 4. Continue Carafate x4 weeks 5. Please obtain BMP/CBC in one week 6. Continue monitor blood pressure and adjust antihypertensives as needed   Discharge Condition: Stable CODE STATUS: Full code Diet recommendation: Heart healthy diet  History of present illness:  Linda Glass is an 87 year old female with past medical history significant for essential hypertension, arthritis, hyperlipidemia, anxiety, class I obesity, chronic atrial fibrillation presented to the emergency department for low hemoglobin level.  Patient states has been feeling very fatigued since last Friday with increased hypersomnolence.  Patient denies abdominal pain, no melena, no hematochezia, no nausea/vomiting/diarrhea, no dysuria, urinary frequency or hematuria.  No chest pain, diaphoresis, dizziness.  In the ED, temperature 97.5 F, HR 77, RR 17, BP 187/73, SPO2 99% on room air.  WBC 3.7, hemoglobin 5.4, platelets 112, COVID-19 PCR positive.  Potassium 3.4, CO2 21, magnesium 1.6.  Patient was started on 2 units PRBC.  Hospital service consulted for further evaluation management of severe anemia.  Hospital course:  Severe iron deficiency anemia Gastric ulcer Tubular adenoma at ileocecal valve Senting with progressive fatigue, found to have a hemoglobin of 5.4.  Iron panel with iron 20, TIBC 521, ferritin 12, folate 15.8, B12 5939.  [facility] was consulted and patient underwent EGD on 05/05/2020 with findings of few nonbleeding cratered gastric ulcers s/p  biopsy with pathology consistent with focal intestinal metaplasia and negative for H. pylori without dysplasia or malignancy.  Colonoscopy with findings of a polypoid large mass ileocecal valve 5 cm in length without bleeding s/p biopsies with pathology consistent with tubular adenoma with no high-grade dysplasia or malignancy.  Completed transfusion total 4 units during hospitalization. Protonix 40 mg p.o. twice daily x3 months followed by once daily. Carafate 1 g TIDAC and HS x 4 weeks. Outpatient follow-up with gastroenterology, Dr. Rush Landmark for tubular adenoma resection; follow-up scheduled on 06/05/2020 at 10 AM.  Repeat CBC 1 week  Acute on chronic diastolic congestive heart failure TTE with LVEF 60-65% with moderate LVH, left/right atria mildly dilated.  Cardiology was consulted and followed during hospital course.  Continue furosemide 40 mg p.o. daily, Metoprolol Succinate 25 mg p.o. daily, Losartan 50 mg p.o. daily. Continue statin. Outpatient follow-up with cardiology  Paroxysmal atrial fibrillation TTE with LVEF 60-65%, moderate LVH.  TSH within normal limits.  Continue rate control with metoprolol succinate 25 mg p.o. daily.  Can resume anticoagulation with Eliquis after discharge.  Essential hypertension Amlodipine 10 mg p.o. daily, Furosemide 40 mg p.o. daily, Losartan 50 mg p.o. daily, Metoprolol succinate 25 mg p.o. daily  Hypokalemia Hypomagnesemia Repleted during hospitalization.  Follow-up BMP 1 week.  Hyperlipidemia: Continue atorvastatin 40 mg p.o. daily  Covid-19 viral infection Completed course of remdesivir.  Oxygenating well on room air.  Weakness/deconditioning/debility. Discharging to SNF for further rehabilitation.  Discharge Diagnoses:  Active Problems:   Class 1 obesity   Hyperlipidemia, mixed   Hypertension, essential, benign   Anxiety disorder   Chronic atrial fibrillation (HCC)   2019 novel coronavirus disease (COVID-19)   Paroxysmal atrial  fibrillation (HCC)   Multiple gastric ulcers  Cecum mass    Discharge Instructions  Discharge Instructions    Call MD for:  difficulty breathing, headache or visual disturbances   Complete by: As directed    Call MD for:  extreme fatigue   Complete by: As directed    Call MD for:  persistant dizziness or light-headedness   Complete by: As directed    Call MD for:  persistant nausea and vomiting   Complete by: As directed    Call MD for:  severe uncontrolled pain   Complete by: As directed    Call MD for:  temperature >100.4   Complete by: As directed    Diet - low sodium heart healthy   Complete by: As directed    Increase activity slowly   Complete by: As directed      Allergies as of 05/12/2020   No Known Allergies     Medication List    TAKE these medications   amLODipine 10 MG tablet Commonly known as: NORVASC Take 1 tablet (10 mg total) by mouth daily.   apixaban 5 MG Tabs tablet Commonly known as: ELIQUIS Take 1 tablet (5 mg total) by mouth 2 (two) times daily.   atorvastatin 40 MG tablet Commonly known as: LIPITOR TAKE 1 TABLET EACH DAY. What changed:   how much to take  how to take this  when to take this  additional instructions   citalopram 10 MG tablet Commonly known as: CELEXA Take 1 tablet (10 mg total) by mouth daily.   furosemide 40 MG tablet Commonly known as: LASIX Take 1 tablet (40 mg total) by mouth daily. What changed:   medication strength  how much to take  when to take this   LORazepam 0.5 MG tablet Commonly known as: ATIVAN Take 0.5 tablets (0.25 mg total) by mouth daily as needed for anxiety. What changed: See the new instructions.   losartan 50 MG tablet Commonly known as: COZAAR Take 1 tablet (50 mg total) by mouth daily.   magnesium oxide 400 (241.3 Mg) MG tablet Commonly known as: MAG-OX Take 0.5 tablets (200 mg total) by mouth daily.   metoprolol succinate 25 MG 24 hr tablet Commonly known as:  TOPROL-XL Take 1 tablet (25 mg total) by mouth daily.   multivitamin with minerals Tabs tablet Take 1 tablet by mouth daily.   pantoprazole 40 MG tablet Commonly known as: PROTONIX Take 1 tablet (40 mg total) by mouth 2 (two) times daily for 90 days, THEN 1 tablet (40 mg total) daily. Start taking on: May 12, 2020   sucralfate 1 GM/10ML suspension Commonly known as: CARAFATE Take 10 mLs (1 g total) by mouth 4 (four) times daily -  with meals and at bedtime for 28 days.   VITAMIN B 12 PO Take 1 tablet by mouth daily.       Follow-up Information    Willia Craze, NP Follow up on 06/05/2020.   Specialty: Gastroenterology Why: at 10:00 am.  Contact information: [place] [address] [phone]        [name], [name], MD. Schedule an appointment as soon as possible for a visit in 1 week(s).   Specialty: Family Medicine Contact information: [place] [address] [phone]        [name], MD .   Specialty: Cardiology Contact information: [address] [place] [address] [phone]              No Known  Allergies  Consultations:  [facility]  Cardiology   Procedures/Studies: CT ANGIO CHEST PE W OR WO CONTRAST  Result Date: 05/05/2020 CLINICAL DATA:  Shortness of breath.  COVID positive. EXAM: CT ANGIOGRAPHY CHEST WITH CONTRAST TECHNIQUE: Multidetector CT imaging of the chest was performed using the standard protocol during bolus administration of intravenous contrast. Multiplanar CT image reconstructions and MIPs were obtained to evaluate the vascular anatomy. CONTRAST:  46mL OMNIPAQUE IOHEXOL 350 MG/ML SOLN COMPARISON:  Chest x-ray 05/02/2020 FINDINGS: Cardiovascular: The heart is within normal limits in size for age. No pericardial effusion. There is tortuosity and calcification of the thoracic aorta but no focal aneurysm or dissection. Scattered coronary artery calcifications. The  pulmonary arterial tree is fairly well opacified. No filling defects to suggest pulmonary embolism. Mediastinum/Nodes: No mediastinal or hilar mass or adenopathy. The esophagus is grossly normal. There is a moderate sized hiatal hernia. Lungs/Pleura: Moderate-sized bilateral pleural effusions with overlying atelectasis, left greater than right. No pulmonary edema or pulmonary infiltrates. No worrisome pulmonary lesions. Upper Abdomen: No significant upper abdominal findings. Advanced aortic calcifications are noted. Musculoskeletal: Lobulated appearing masses in the right breast. Recommend correlation with physical exam and ultrasound or mammography as indicated. No supraclavicular or axillary adenopathy. The bony thorax is intact. There is a remote appearing compression fracture of T12 with vertebral plana appearance. Review of the MIP images confirms the above findings. IMPRESSION: 1. No CT findings for pulmonary embolism. 2. Tortuosity and calcification of the thoracic aorta but no focal aneurysm or dissection. 3. Moderate-sized bilateral pleural effusions with overlying atelectasis, left greater than right. 4. Lobulated appearing masses in the right breast. Recommend correlation with physical exam and ultrasound or mammography as indicated. 5. Remote appearing T12 compression fracture with vertebral plana appearance. 6. Aortic atherosclerosis. Aortic Atherosclerosis (ICD10-I70.0). Electronically Signed   By: Marijo Sanes M.D.   On: 05/05/2020 19:12   DG CHEST PORT 1 VIEW  Result Date: 05/02/2020 CLINICAL DATA:  COVID-19. EXAM: PORTABLE CHEST 1 VIEW COMPARISON:  No prior. FINDINGS: Cardiomegaly. Bilateral interstitial prominence. Interstitial edema and/or pneumonitis could present this fashion. Left base atelectasis/consolidation. Small bilateral pleural effusions. No pneumothorax. IMPRESSION: 1. Cardiomegaly. 2. Bilateral interstitial prominence. Interstitial edema and/or pneumonitis could present this  fashion. Small bilateral pleural effusions. 3. Left base atelectasis/consolidation. Electronically Signed   By: Marcello Moores  Register   On: 05/02/2020 05:12   DG Hand Complete Left  Result Date: 05/10/2020 CLINICAL DATA:  Left hand pain and swelling. EXAM: LEFT HAND - COMPLETE 3+ VIEW COMPARISON:  None. FINDINGS: No acute fracture or dislocation. Mild radioscaphoid and second through fifth MCP joint space narrowing. Moderate first CMC joint and diffuse IP joint osteoarthritis. Osteopenia. Soft tissues are unremarkable. IMPRESSION: 1.  No acute osseous abnormality. 2. Scattered osteoarthritis as described above. Electronically Signed   By: Titus Dubin M.D.   On: 05/10/2020 14:50   ECHOCARDIOGRAM COMPLETE  Result Date: 05/02/2020    ECHOCARDIOGRAM REPORT   Patient Name:   JANAI MAUDLIN Millhouse Date of Exam: 05/02/2020 Medical Rec #:  [id number]                   Height:       62.0 in Accession #:    [id number]                  Weight:       175.0 lb Date of Birth:  15-Aug-1933  BSA:          1.806 m Patient Age:    38 years                    BP:           143/55 mmHg Patient Gender: F                           HR:           69 bpm. Exam Location:  Inpatient Procedure: 2D Echo, 3D Echo, Cardiac Doppler and Color Doppler Indications:    I48.91* Unspeicified atrial fibrillation  History:        Patient has no prior history of Echocardiogram examinations.                 Abnormal ECG, Arrythmias:Atrial Fibrillation; Risk                 Factors:Hypertension and Dyslipidemia. Covid infection 2019.                 Current covid infection. Heart block. Edema.  Sonographer:    Roseanna Rainbow RDCS Referring Phys: [id number] [place]  Sonographer Comments: Image acquisition challenging due to patient body habitus. IMPRESSIONS  1. Left ventricular ejection fraction, by estimation, is 60 to 65%. The left ventricle has normal function. The left ventricle has no regional wall motion abnormalities. There  is moderate left ventricular hypertrophy. Left ventricular diastolic function  could not be evaluated.  2. Right ventricular systolic function is normal. The right ventricular size is normal. There is moderately elevated pulmonary artery systolic pressure. The estimated right ventricular systolic pressure is 51.0 mmHg.  3. Left atrial size was mildly dilated.  4. Right atrial size was mildly dilated.  5. The mitral valve is normal in structure. Mild mitral valve regurgitation.  6. The aortic valve is calcified. There is mild calcification of the aortic valve. There is mild thickening of the aortic valve. Aortic valve regurgitation is not visualized. No aortic stenosis is present.  7. The inferior vena cava is dilated in size with <50% respiratory variability, suggesting right atrial pressure of 15 mmHg. FINDINGS  Left Ventricle: Left ventricular ejection fraction, by estimation, is 60 to 65%. The left ventricle has normal function. The left ventricle has no regional wall motion abnormalities. The left ventricular internal cavity size was normal in size. There is  moderate left ventricular hypertrophy. Left ventricular diastolic function could not be evaluated due to atrial fibrillation. Left ventricular diastolic function could not be evaluated. Right Ventricle: The right ventricular size is normal. No increase in right ventricular wall thickness. Right ventricular systolic function is normal. There is moderately elevated pulmonary artery systolic pressure. The tricuspid regurgitant velocity is 3.12 m/s, and with an assumed right atrial pressure of 15 mmHg, the estimated right ventricular systolic pressure is 25.8 mmHg. Left Atrium: Left atrial size was mildly dilated. Right Atrium: Right atrial size was mildly dilated. Pericardium: There is no evidence of pericardial effusion. Mitral Valve: The mitral valve is normal in structure. There is mild thickening of the mitral valve leaflet(s). Mild mitral annular  calcification. Mild mitral valve regurgitation. MV peak gradient, 17.8 mmHg. The mean mitral valve gradient is 3.0 mmHg. Tricuspid Valve: The tricuspid valve is normal in structure. Tricuspid valve regurgitation is mild. Aortic Valve: The aortic valve is calcified. There is mild calcification of the aortic valve. There is mild thickening of  the aortic valve. Aortic valve regurgitation is not visualized. No aortic stenosis is present. Pulmonic Valve: The pulmonic valve was grossly normal. Pulmonic valve regurgitation is trivial. Aorta: The aortic root and ascending aorta are structurally normal, with no evidence of dilitation. Venous: The inferior vena cava is dilated in size with less than 50% respiratory variability, suggesting right atrial pressure of 15 mmHg. IAS/Shunts: The atrial septum is grossly normal.  LEFT VENTRICLE PLAX 2D LVIDd:         4.30 cm LVIDs:         2.20 cm LV PW:         1.55 cm LV IVS:        1.35 cm LVOT diam:     1.80 cm LV SV:         66 LV SV Index:   36 LVOT Area:     2.54 cm  LV Volumes (MOD) LV vol d, MOD A2C: 68.3 ml LV vol d, MOD A4C: 70.8 ml LV vol s, MOD A2C: 14.1 ml LV vol s, MOD A4C: 16.0 ml LV SV MOD A2C:     54.2 ml LV SV MOD A4C:     70.8 ml LV SV MOD BP:      54.9 ml RIGHT VENTRICLE             IVC RV S prime:     10.40 cm/s  IVC diam: 2.40 cm TAPSE (M-mode): 2.1 cm LEFT ATRIUM           Index       RIGHT ATRIUM           Index LA diam:      4.50 cm 2.49 cm/m  RA Area:     17.50 cm LA Vol (A2C): 45.1 ml 24.97 ml/m RA Volume:   48.40 ml  26.80 ml/m LA Vol (A4C): 67.5 ml 37.37 ml/m  AORTIC VALVE LVOT Vmax:   130.00 cm/s LVOT Vmean:  92.100 cm/s LVOT VTI:    0.258 m  AORTA Ao Root diam: 3.60 cm Ao Asc diam:  3.40 cm MITRAL VALVE                 TRICUSPID VALVE MV Area (PHT): 3.36 cm      TR Peak grad:   38.9 mmHg MV Area VTI:   1.51 cm      TR Vmax:        312.00 cm/s MV Peak grad:  17.8 mmHg MV Mean grad:  3.0 mmHg      SHUNTS MV Vmax:       2.11 m/s      Systemic VTI:   0.26 m MV Vmean:      80.1 cm/s     Systemic Diam: 1.80 cm MV Decel Time: 226 msec MR Peak grad:    105.3 mmHg MR Mean grad:    68.0 mmHg MR Vmax:         513.00 cm/s MR Vmean:        394.0 cm/s MR PISA:         0.57 cm MR PISA Eff ROA: 4 mm MR PISA Radius:  0.30 cm MV E velocity: 137.67 cm/s Mertie Moores MD Electronically signed by Mertie Moores MD Signature Date/Time: 05/02/2020/1:13:09 PM    Final      Subjective: Patient seen and examined at bedside, resting comfortably.  No specific complaints this morning.  Discharging to SNF today.  Denies headache, no fever/chills/night sweats, no nausea/vomiting/diarrhea, no chest  pain, no palpitations, no shortness of breath, no abdominal pain, no weakness, no fatigue, no paresthesias.  No acute events overnight per nursing staff.  Discharge Exam: Vitals:   05/11/20 1944 05/12/20 0513  BP: (!) 158/71 (!) 171/69  Pulse: 63 62  Resp:  18  Temp: 98.1 F (36.7 C) 98 F (36.7 C)  SpO2: 95% 93%   Vitals:   05/11/20 1259 05/11/20 1944 05/12/20 0500 05/12/20 0513  BP: (!) 144/65 (!) 158/71  (!) 171/69  Pulse: 71 63  62  Resp: 16   18  Temp: 98.3 F (36.8 C) 98.1 F (36.7 C)  98 F (36.7 C)  TempSrc: Oral Oral  Oral  SpO2: 94% 95%  93%  Weight:   78.4 kg   Height:        General: Pt is alert, awake, not in acute distress Cardiovascular: RRR, S1/S2 +, no rubs, no gallops Respiratory: CTA bilaterally, no wheezing, no rhonchi Abdominal: Soft, NT, ND, bowel sounds + Extremities: no edema, no cyanosis    The results of significant diagnostics from this hospitalization (including imaging, microbiology, ancillary and laboratory) are listed below for reference.     Microbiology: No results found for this or any previous visit (from the past 240 hour(s)).   Labs: BNP (last 3 results) Recent Labs    05/02/20 1116  BNP 017.5*   Basic Metabolic Panel: Recent Labs  Lab 05/06/20 0426 05/07/20 0416 05/08/20 0320 05/08/20 1541  05/09/20 1536 05/10/20 0351 05/11/20 0343  NA 139  --  137  --  136 137 139  K 3.5  --  2.7* 3.3* 2.6* 3.2* 3.9  CL 102  --  102  --  101 103 105  CO2 26  --  26  --  25 25 26   GLUCOSE 98  --  97  --  90 100* 90  BUN 8  --  10  --  9 7* 9  CREATININE 0.46  --  0.42*  --  0.51 0.40* 0.46  CALCIUM 8.8*  --  8.7*  --  8.7* 9.0 9.2  MG 1.7 1.5*  --   --  1.5*  --  1.5*  PHOS 3.8 4.2  --   --   --   --   --    Liver Function Tests: Recent Labs  Lab 05/06/20 0426  AST 19  ALT 14  ALKPHOS 87  BILITOT 1.5*  PROT 5.7*  ALBUMIN 2.8*   No results for input(s): LIPASE, AMYLASE in the last 168 hours. No results for input(s): AMMONIA in the last 168 hours. CBC: Recent Labs  Lab 05/06/20 0426 05/07/20 0416 05/08/20 0320 05/09/20 1536 05/10/20 0351 05/11/20 0343  WBC 5.6 5.5 5.4 9.3 8.9 5.5  NEUTROABS 3.9 3.7  --  7.3  --   --   HGB 7.7* 7.4* 9.2* 9.6* 9.4* 9.1*  HCT 28.0* 26.9* 31.3* 32.2* 32.4* 31.3*  MCV 76.9* 77.1* 77.9* 77.6* 78.8* 77.7*  PLT 107* 110* 111* 148* 149* 113*   Cardiac Enzymes: No results for input(s): CKTOTAL, CKMB, CKMBINDEX, TROPONINI in the last 168 hours. BNP: Invalid input(s): POCBNP CBG: No results for input(s): GLUCAP in the last 168 hours. D-Dimer No results for input(s): DDIMER in the last 72 hours. Hgb A1c No results for input(s): HGBA1C in the last 72 hours. Lipid Profile No results for input(s): CHOL, HDL, LDLCALC, TRIG, CHOLHDL, LDLDIRECT in the last 72 hours. Thyroid function studies No results for input(s): TSH, T4TOTAL, T3FREE, THYROIDAB in  the last 72 hours.  Invalid input(s): FREET3 Anemia work up No results for input(s): VITAMINB12, FOLATE, FERRITIN, TIBC, IRON, RETICCTPCT in the last 72 hours. Urinalysis    Component Value Date/Time   COLORURINE YELLOW 05/02/2020 0021   APPEARANCEUR CLEAR 05/02/2020 0021   LABSPEC 1.006 05/02/2020 0021   PHURINE 5.0 05/02/2020 0021   GLUCOSEU NEGATIVE 05/02/2020 0021   HGBUR SMALL (A)  05/02/2020 0021   BILIRUBINUR NEGATIVE 05/02/2020 0021   KETONESUR NEGATIVE 05/02/2020 0021   PROTEINUR NEGATIVE 05/02/2020 0021   NITRITE NEGATIVE 05/02/2020 0021   LEUKOCYTESUR NEGATIVE 05/02/2020 0021   Sepsis Labs Invalid input(s): PROCALCITONIN,  WBC,  LACTICIDVEN Microbiology No results found for this or any previous visit (from the past 240 hour(s)).   Time coordinating discharge: Over 30 minutes  SIGNED:   Avaeh Ewer J [place] DO  [facility] 05/12/2020, 10:14 AM

## 2020-05-12 NOTE — TOC Transition Note (Signed)
Transition of Care [facility]) - CM/SW Discharge Note   Patient Details  Name: Makenzee Choudhry MRN: [id number] Date of Birth: 01/06/1934  Transition of Care [facility]) CM/SW Contact:  Ross Ludwig, LCSW Phone Number: 05/12/2020, 11:22 AM   Clinical Narrative:     Patient to be d/c'ed today to [facility] room 104P.  Patient and family agreeable to plans will transport via ems RN to call report to [phone].  Patient's son in law Nicki Reaper is at bedside and is aware that she is discharging today.   Final next level of care: Skilled Nursing Facility Barriers to Discharge: Barriers Resolved   Patient Goals and CMS Choice Patient states their goals for this hospitalization and ongoing recovery are:: To return back home with home health after rehab. CMS Medicare.gov Compare Post Acute Care list provided to:: Patient Represenative (must comment) Choice offered to / list presented to : [place]  Discharge Placement PASRR number recieved: 05/09/20            Patient chooses bed at: [facility] Patient to be transferred to facility by: [facility] Name of family member notified: Patient's Son in Oceanographer Patient and family notified of of transfer: 05/12/20  Discharge Plan and Services                                     Social Determinants of Health ([place]) Interventions     Readmission Risk Interventions No flowsheet data found.

## 2020-05-12 NOTE — Discharge Instructions (Signed)
[employer] medicine (25th ed., pp. [phone]). [place], [place]: Elsevier.">  Anemia  Anemia is a condition in which there is not enough red blood cells or hemoglobin in the blood. Hemoglobin is a substance in red blood cells that carries oxygen. When you do not have enough red blood cells or hemoglobin (are anemic), your body cannot get enough oxygen and your organs may not work properly. As a result, you may feel very tired or have other problems. What are the causes? Common causes of anemia include:  Excessive bleeding. Anemia can be caused by excessive bleeding inside or outside the body, including bleeding from the intestines or from heavy menstrual periods in females.  Poor nutrition.  Long-lasting (chronic) kidney, thyroid, and liver disease.  Bone marrow disorders, spleen problems, and blood disorders.  Cancer and treatments for cancer.  HIV (human immunodeficiency virus) and AIDS (acquired immunodeficiency syndrome).  Infections, medicines, and autoimmune disorders that destroy red blood cells. What are the signs or symptoms? Symptoms of this condition include:  Minor weakness.  Dizziness.  Headache, or difficulties concentrating and sleeping.  Heartbeats that feel irregular or faster than normal (palpitations).  Shortness of breath, especially with exercise.  Pale skin, lips, and nails, or cold hands and feet.  Indigestion and nausea. Symptoms may occur suddenly or develop slowly. If your anemia is mild, you may not have symptoms. How is this diagnosed? This condition is diagnosed based on blood tests, your medical history, and a physical exam. In some cases, a test may be needed in which cells are removed from the soft tissue inside of a bone and looked at under a microscope (bone marrow biopsy). Your health care provider may also check your stool (feces) for blood and may do additional testing to look for the cause of your bleeding. Other tests may  include:  Imaging tests, such as a CT scan or MRI.  A procedure to see inside your esophagus and stomach (endoscopy).  A procedure to see inside your colon and rectum (colonoscopy). How is this treated? Treatment for this condition depends on the cause. If you continue to lose a lot of blood, you may need to be treated at a hospital. Treatment may include:  Taking supplements of iron, vitamin Q68, or folic acid.  Taking a hormone medicine (erythropoietin) that can help to stimulate red blood cell growth.  Having a blood transfusion. This may be needed if you lose a lot of blood.  Making changes to your diet.  Having surgery to remove your spleen. Follow these instructions at home:  Take over-the-counter and prescription medicines only as told by your health care provider.  Take supplements only as told by your health care provider.  Follow any diet instructions that you were given by your health care provider.  Keep all follow-up visits as told by your health care provider. This is important. Contact a health care provider if:  You develop new bleeding anywhere in the body. Get help right away if:  You are very weak.  You are short of breath.  You have pain in your abdomen or chest.  You are dizzy or feel faint.  You have trouble concentrating.  You have bloody stools, black stools, or tarry stools.  You vomit repeatedly or you vomit up blood. These symptoms may represent a serious problem that is an emergency. Do not wait to see if the symptoms will go away. Get medical help right away. Call your local emergency services (911 in the [place] Do not  drive yourself to the hospital. Summary  Anemia is a condition in which you do not have enough red blood cells or enough of a substance in your red blood cells that carries oxygen (hemoglobin).  Symptoms may occur suddenly or develop slowly.  If your anemia is mild, you may not have symptoms.  This condition is  diagnosed with blood tests, a medical history, and a physical exam. Other tests may be needed.  Treatment for this condition depends on the cause of the anemia. This information is not intended to replace advice given to you by your health care provider. Make sure you discuss any questions you have with your health care provider. Document Revised: 01/19/2019 Document Reviewed: 01/19/2019 Elsevier Patient Education  2021 Elsevier Inc.  

## 2020-05-13 NOTE — Telephone Encounter (Signed)
Probably best to see in clinic and talk with the patient and family. She is currently scheduled with Nevin Bloodgood on a date when I am not in clinic so discussing risks of this colonoscopy will be difficult. Would be ideal if she can be set up on a date when I am in clinic so that I can come into the room and talk with the patient and family as well. Follow-up labs and preprocedure labs can be done on that date of clinic after it is done.  JLL, I would also going to your note that you left because the notation in that you wrote said that this was an adenocarcinoma when you meant adenoma based on the final pathology.  Patty, can you rearrange this patient's clinic visit with Nevin Bloodgood or Anderson Malta on a date when I am also in clinic so that I can meet the patient and family to discuss briefly EMR attempt of this lesion?  Thanks. GM

## 2020-05-15 NOTE — Telephone Encounter (Signed)
Pt appt has been changed to 05/30/20 at 1030 am with [facility]. Dr Rush Landmark is also in clinic this date. Pt has been advised.

## 2020-05-15 NOTE — Telephone Encounter (Signed)
Thank you for catching that, I have fixed it now.  JLL

## 2020-05-23 ENCOUNTER — Ambulatory Visit: Payer: Medicare Other | Admitting: General Practice

## 2020-05-25 ENCOUNTER — Telehealth: Payer: Self-pay | Admitting: Family Medicine

## 2020-05-25 NOTE — Telephone Encounter (Signed)
Lavella Lemons is calling and stated that patient is discharging from a skilled nursing home and they have requested an order for home health and wanted to see if the provider would follow these orders. Orders are for PT and OT to evaluate and treat, please advise. CB is [phone]

## 2020-05-26 NOTE — Telephone Encounter (Signed)
I spoke with Linda Glass, verbal given.

## 2020-05-30 ENCOUNTER — Other Ambulatory Visit: Payer: Self-pay

## 2020-05-30 ENCOUNTER — Encounter: Payer: Self-pay | Admitting: Nurse Practitioner

## 2020-05-30 ENCOUNTER — Ambulatory Visit: Payer: Medicare Other | Admitting: Nurse Practitioner

## 2020-05-30 VITALS — BP 108/60 | HR 80 | Ht 60.75 in | Wt 162.2 lb

## 2020-05-30 DIAGNOSIS — K219 Gastro-esophageal reflux disease without esophagitis: Secondary | ICD-10-CM | POA: Diagnosis not present

## 2020-05-30 DIAGNOSIS — K6389 Other specified diseases of intestine: Secondary | ICD-10-CM | POA: Diagnosis not present

## 2020-05-30 DIAGNOSIS — D509 Iron deficiency anemia, unspecified: Secondary | ICD-10-CM

## 2020-05-30 NOTE — Progress Notes (Signed)
ASSESSMENT AND PLAN    # 87 yo female recently hospitalized with severe iron deficiency anemia, heme negative stools and nonbleeding, clean-based gastric ulcers on EGD in the setting of NSAID use (biopsy shows reactive gastropathy, focal intestinal metaplasia and no H. pylori ). Also a 5 cm mass at the ileocecal valve which was biopsied and proven to be a tubular adenoma without high-grade dysplasia .  --Patient no longer taking NSAIDs .  --Continue pantoprazole BID for another 8 weeks then decrease to once daily --Hemoglobin improved from 5.4 >> 9 post 2 units PRBCs.  Currently on oral iron every other day   # Chronic intermittent GERD with heartburn --Continue PPI --Antireflux measures discussed. Certain food / beverages may make GERD symptoms worse such as alcohol, chocolate, fried or high fat foods, peppermint, carbonated beverages, spicy foods, onions, tomatoes, garlic, and tomato-based foods, citrus fruits, and juices.  Avoid late evening meals / bedtime snacking.  If able, elevate head of bed 6-8 inches. If unable to elevate the head of the bed consider a wedge pillow.   Weight reduction / maintaining a healthy BMI (body mass index) is important as increased abdominal girth is associated with reflux.   # High risk colon lesion. She has a 5 cm tubular adenoma at ICV found on colonoscopy last month.  --Lesion needs EMR by Dr. Rush Landmark.  If lesion not amenable to EMR then needs surgical resection. Dr. Silverio Decamp will be discussing case with Dr. Vernie Murders  # Atrial fibrillation, on Eliquis  HISTORY OF PRESENT ILLNESS    Chief Complaint : hospital follow up  Linda Glass is a 87 y.o. female known to [place] with a past medical history of COVID19  In early March patient was admitted to the hospital with symptomatic, severe iron deficiency anemia, hemoglobin was 5.8, ferritin 12. She also had new onset Afib and asymptomatic COVID 19.  She endorsed intermittent dark  stools but thought it was because she was eating something dark.  She was Hemoccult negative.  We saw her in consultation.  Inpatient EGD remarkable for a few nonbleeding cratered.  Clean-based gastric ulcers.  Biopsies compatible with reactive gastropathy and focal intestinal metaplasia.  No H. Pylori.  Patient had been taking Motrin prior to admission.  She also underwent colonoscopy with findings of a 5 cm mass at the ileocecal valve.  Biopsies of the mass compatible with tubular adenoma without high-grade dysplasia.  Patient was started on high-dose PPI . She received 2 units of blood with improvement in hemoglobin to 9.5.   Regarding new onset atrial fibrillation, cardiology evaluated the patient and started her on apixaban    INTERVAL HISTORY:  Patient is here with her daughter who is visiting from the [facility].  Patient says she feels well. No blood in stool. Bowel movements slightly loose from some of her medications but not having diarrhea.  She is still taking twice daily pantoprazole.  She takes Tylenol, has not had any NSAIDs since recent hospitalization   PREVIOUS ENDOSCOPIC EVALUATIONS / PERTINENT STUDIES:   05/05/20 EGD severe anemia -A 5 cm hiatal hernia was present. -Few non-bleeding cratered gastric ulcers with a clean ulcer base (Forrest Class III) were found in theprepyloric region of the stomach and at the pylorus. The largest lesion was 10 mm in largest dimension. Biopsies were taken with a cold forceps for histology. Biopsies were taken with a cold forceps for Helicobacter pylori testing  05/05/20 Colonoscopy for IDA A polypoid large mass was  found at the ileocecal valve. The mass measured five cm in length. No bleeding was present. Biopsies were taken with a cold forceps for histology. Multiple small and large-mouthed diverticula were found in the sigmoid colon and descending colon./ Non-bleeding internal hemorrhoids were found during retroflexion. The hemorrhoids were  small   FINAL MICROSCOPIC DIAGNOSIS:   A. STOMACH, BIOPSY:  - Reactive gastropathy with focal intestinal metaplasia.  - Warthin-Starry is negative for Helicobacter pylori.  - No dysplasia or malignancy.   B. ILLEOCECAL VALVE MASS, BIOPSY:  - Tubular adenoma (x4 fragments).  - No high grade dysplasia or malignancy.    Past Medical History:  Diagnosis Date  . Anxiety   . Arthritis   . Chicken pox   . COVID-19   . Heart murmur   . Hyperlipidemia     Current Medications, Allergies, Past Surgical History, Family History and Social History were reviewed in [employer] record.   Current Outpatient Medications  Medication Sig Dispense Refill  . amLODipine (NORVASC) 10 MG tablet Take 1 tablet (10 mg total) by mouth daily.    Marland Kitchen apixaban (ELIQUIS) 5 MG TABS tablet Take 1 tablet (5 mg total) by mouth 2 (two) times daily. 60 tablet 0  . atorvastatin (LIPITOR) 40 MG tablet TAKE 1 TABLET EACH DAY. (Patient taking differently: Take 40 mg by mouth daily.) 90 tablet 3  . citalopram (CELEXA) 10 MG tablet Take 1 tablet (10 mg total) by mouth daily. 90 tablet 3  . Cyanocobalamin (VITAMIN B 12 PO) Take 1 tablet by mouth daily.    . Ferrous Gluconate 324 (37.5 Fe) MG TABS Take 1 tablet by mouth every other day.    . furosemide (LASIX) 40 MG tablet Take 1 tablet (40 mg total) by mouth daily. 30 tablet   . LORazepam (ATIVAN) 0.5 MG tablet Take 0.5 tablets (0.25 mg total) by mouth daily as needed for anxiety. 30 tablet 0  . losartan (COZAAR) 50 MG tablet Take 1 tablet (50 mg total) by mouth daily.    . magnesium oxide (MAG-OX) 400 (241.3 Mg) MG tablet Take 0.5 tablets (200 mg total) by mouth daily.    . metoprolol succinate (TOPROL-XL) 25 MG 24 hr tablet Take 1 tablet (25 mg total) by mouth daily. 90 tablet 0  . Multiple Vitamin (MULTIVITAMIN WITH MINERALS) TABS tablet Take 1 tablet by mouth daily.    . pantoprazole (PROTONIX) 40 MG tablet Take 1 tablet (40 mg total) by mouth 2  (two) times daily for 90 days, THEN 1 tablet (40 mg total) daily. 146 tablet 0  . potassium chloride SA (KLOR-CON) 20 MEQ tablet Take 20 mEq by mouth daily.    . sucralfate (CARAFATE) 1 GM/10ML suspension Take 10 mLs (1 g total) by mouth 4 (four) times daily -  with meals and at bedtime for 28 days. (Patient not taking: Reported on 05/30/2020) 1120 mL 0   No current facility-administered medications for this visit.    Review of Systems: No chest pain. No shortness of breath. No urinary complaints.   PHYSICAL EXAM :    Wt Readings from Last 3 Encounters:  05/30/20 162 lb 4 oz (73.6 kg)  05/12/20 172 lb 13.5 oz (78.4 kg)  02/11/20 180 lb 3.2 oz (81.7 kg)    BP 108/60 (BP Location: Left Arm, Patient Position: Sitting, Cuff Size: Normal)   Pulse 80   Ht 5' 0.75" (1.543 m) Comment: height measured without shoes  Wt 162 lb 4 oz (73.6 kg)  BMI 30.91 kg/m  Constitutional:  Pleasant female in no acute distress. Psychiatric: Normal mood and affect. Behavior is normal. EENT: Pupils normal.  Conjunctivae are normal. No scleral icterus. Neck supple.  Cardiovascular: Normal rate, regular rhythm. No edema Pulmonary/chest: Effort normal and breath sounds normal. No wheezing, rales or rhonchi. Abdominal: Soft, nondistended, nontender. Bowel sounds active throughout. There are no masses palpable. No hepatomegaly. Neurological: Alert and oriented to person place and time. Skin: Skin is warm and dry. No rashes noted.  I spent 30 minutes total reviewing records, obtaining history, performing exam, counseling patient and documenting visit / findings.    Tye Savoy, NP  05/30/2020, 11:05 AM

## 2020-05-30 NOTE — Patient Instructions (Addendum)
If you are age 85 or older, your body mass index should be between 23-30. Your Body mass index is 30.91 kg/m. If this is out of the aforementioned range listed, please consider follow up with your Primary Care Provider.  We will be in touch regarding your colon resection.  It was great seeing you today! Thank you for entrusting me with your care and choosing [facility].  Tye Savoy, NP

## 2020-05-31 ENCOUNTER — Other Ambulatory Visit: Payer: Self-pay

## 2020-05-31 ENCOUNTER — Ambulatory Visit (INDEPENDENT_AMBULATORY_CARE_PROVIDER_SITE_OTHER): Payer: Medicare Other | Admitting: Family Medicine

## 2020-05-31 ENCOUNTER — Encounter: Payer: Self-pay | Admitting: Family Medicine

## 2020-05-31 DIAGNOSIS — I4891 Unspecified atrial fibrillation: Secondary | ICD-10-CM | POA: Diagnosis not present

## 2020-05-31 DIAGNOSIS — D500 Iron deficiency anemia secondary to blood loss (chronic): Secondary | ICD-10-CM

## 2020-05-31 DIAGNOSIS — K6389 Other specified diseases of intestine: Secondary | ICD-10-CM

## 2020-05-31 DIAGNOSIS — I10 Essential (primary) hypertension: Secondary | ICD-10-CM | POA: Diagnosis not present

## 2020-05-31 DIAGNOSIS — E876 Hypokalemia: Secondary | ICD-10-CM

## 2020-05-31 DIAGNOSIS — K259 Gastric ulcer, unspecified as acute or chronic, without hemorrhage or perforation: Secondary | ICD-10-CM

## 2020-05-31 NOTE — Progress Notes (Signed)
HPI:  [name] is a pleasant 87 y.o. female, who is here today with her daughter to follow on recent hospitalization. She was hospitalized from 05/01/20 to 05/12/20. Discharged to a SNF , when she stayed from 05/12/20 to 05/27/20.  She was here in the office the day of admission, c/o fatigue and worsening SOB. She was instructed to go to the ER because H/H 5.6/19.4. Iron panel with iron 20, TIBC 521, ferritin 12, folate 15.8, B12 5939 GI evaluation during hospitalization. Underwent colonoscopy and EGD 05/05/20. EGD: Non bleeding cratered gastric ulcers, Bx did not reveal H. Pylori nor malignancy. She is on Protonix 40 mg daily. Completed 28 days of sucralfate.  Colonoscopy with findings of a polypoid large mass ileocecal valve 5 cm in length without bleeding s/p biopsies with pathology consistent with tubular adenoma with no high-grade dysplasia or malignancy. Pending to have polyp removed after she recovers.  She has not noted nausea,vomiting,blood in stool,or melena. Evaluated by GI 05/30/20. She received RBC's transfusion x 3. Currently she is on Ferrous Gluconate 324 mg every other day.  COVID 19 infection, treated with IV Remdesivir.She did not need supplemental O2 therapy.  Lab Results  Component Value Date   WBC 5.5 05/11/2020   HGB 9.1 (L) 05/11/2020   HCT 31.3 (L) 05/11/2020   MCV 77.7 (L) 05/11/2020   PLT 113 (L) 05/11/2020   CHF exacerbation and atrial fib: She was discharged on Furosemide 40 mg daily. Losartan 50 and Amlodipine 10 mg daily added. She is on Metoprolol succinate 25 mg daily and eliquis 5 mg bid.  Echo 05/02/20: LVEF 60-65%.  Negative for CP,SOB,palpitations,diaphoresis,orthopnea,or PND. LE edema has improved.  HypoK+ on KLOR 20 meq daily and hypoMg on Mg oxide  200 mg daily.  Lab Results  Component Value Date   CREATININE 0.46 05/11/2020   BUN 9 05/11/2020   NA 139 05/11/2020   K 3.9 05/11/2020   CL 105 05/11/2020   CO2  26 05/11/2020   She is feeling "much better", back to her baseline. Completed PT at the SNF. Her daughter is staying with her now, planning on having an aid to stay with her for a couple more week until she feels more comfortable living alone. She uses her walker.  Review of Systems  Constitutional: Negative for appetite change, fatigue and fever.  HENT: Negative for mouth sores, nosebleeds and sore throat.   Eyes: Negative for redness and visual disturbance.  Respiratory: Negative for cough and wheezing.   Gastrointestinal: Negative for abdominal pain.       Negative for changes in bowel habits.  Genitourinary: Negative for decreased urine volume, dysuria and hematuria.  Musculoskeletal: Positive for gait problem. Negative for myalgias.  Skin: Negative for rash.  Neurological: Negative for syncope, facial asymmetry, weakness and headaches.  Psychiatric/Behavioral: Negative for behavioral problems and confusion.  Rest see pertinent positives and negatives per HPI.  Current Outpatient Medications on File Prior to Visit  Medication Sig Dispense Refill  . amLODipine (NORVASC) 10 MG tablet Take 1 tablet (10 mg total) by mouth daily.    Marland Kitchen apixaban (ELIQUIS) 5 MG TABS tablet Take 1 tablet (5 mg total) by mouth 2 (two) times daily. 60 tablet 0  . atorvastatin (LIPITOR) 40 MG tablet TAKE 1 TABLET EACH DAY. (Patient taking differently: Take 40 mg by mouth daily.) 90 tablet 3  . citalopram (CELEXA) 10 MG tablet Take 1 tablet (10 mg total) by mouth daily. 90 tablet 3  .  Cyanocobalamin (VITAMIN B 12 PO) Take 1 tablet by mouth daily.    . Ferrous Gluconate 324 (37.5 Fe) MG TABS Take 1 tablet by mouth every other day.    . furosemide (LASIX) 40 MG tablet Take 1 tablet (40 mg total) by mouth daily. 30 tablet   . LORazepam (ATIVAN) 0.5 MG tablet Take 0.5 tablets (0.25 mg total) by mouth daily as needed for anxiety. 30 tablet 0  . losartan (COZAAR) 50 MG tablet Take 1 tablet (50 mg total) by mouth  daily.    . magnesium oxide (MAG-OX) 400 (241.3 Mg) MG tablet Take 0.5 tablets (200 mg total) by mouth daily.    . metoprolol succinate (TOPROL-XL) 25 MG 24 hr tablet Take 1 tablet (25 mg total) by mouth daily. 90 tablet 0  . Multiple Vitamin (MULTIVITAMIN WITH MINERALS) TABS tablet Take 1 tablet by mouth daily.    . pantoprazole (PROTONIX) 40 MG tablet Take 1 tablet (40 mg total) by mouth 2 (two) times daily for 90 days, THEN 1 tablet (40 mg total) daily. 146 tablet 0  . potassium chloride SA (KLOR-CON) 20 MEQ tablet Take 20 mEq by mouth daily.     No current facility-administered medications on file prior to visit.   Past Medical History:  Diagnosis Date  . Anxiety   . Arthritis   . Chicken pox   . COVID-19   . Heart murmur   . Hyperlipidemia    No Known Allergies  Social History   Socioeconomic History  . Marital status: Widowed    Spouse name: Not on file  . Number of children: Not on file  . Years of education: Not on file  . Highest education level: Not on file  Occupational History  . Not on file  Tobacco Use  . Smoking status: Never Smoker  . Smokeless tobacco: Never Used  Substance and Sexual Activity  . Alcohol use: Yes    Comment: Occasional  . Drug use: No  . Sexual activity: Not Currently  Other Topics Concern  . Not on file  Social History Narrative  . Not on file   Social Determinants of Health   Financial Resource Strain: Not on file  Food Insecurity: Not on file  Transportation Needs: Not on file  Physical Activity: Not on file  Stress: Not on file  Social Connections: Not on file   Vitals:   05/31/20 1419  BP: 120/70  Pulse: 68  Resp: 16  SpO2: 95%   Body mass index is 30.91 kg/m.  Physical Exam Vitals and nursing note reviewed.  Constitutional:      General: She is not in acute distress.    Appearance: She is well-developed.  HENT:     Head: Normocephalic and atraumatic.     Mouth/Throat:     Mouth: Mucous membranes are dry.   Eyes:     Conjunctiva/sclera: Conjunctivae normal.  Cardiovascular:     Rate and Rhythm: Normal rate. Rhythm irregular.     Pulses:          Dorsalis pedis pulses are 2+ on the right side and 2+ on the left side.     Heart sounds: No murmur heard.     Comments: Pedal 2 pitting edema, trace pitting LE bilateral.  Pulmonary:     Effort: Pulmonary effort is normal. No respiratory distress.     Breath sounds: Normal breath sounds.  Abdominal:     Palpations: Abdomen is soft. There is no hepatomegaly or mass.  Tenderness: There is no abdominal tenderness.  Musculoskeletal:     Right lower leg: Edema present.     Left lower leg: Edema present.  Lymphadenopathy:     Cervical: No cervical adenopathy.  Skin:    General: Skin is warm.     Findings: No erythema or rash.  Neurological:     Mental Status: She is alert and oriented to person, place, and time.     Cranial Nerves: No cranial nerve deficit.     Comments: Mildly unstable gait assisted with a walker.  Psychiatric:        Mood and Affect: Mood is anxious.     Comments: Well groomed, good eye contact.    ASSESSMENT AND PLAN:  [name] was seen today for follow-up.  Diagnoses and all orders for this visit:  Orders Placed This Encounter  Procedures  . Basic metabolic panel  . CBC  . Ferritin  . Iron  . Magnesium   Lab Results  Component Value Date   WBC 7.0 05/31/2020   HGB 10.3 (L) 05/31/2020   HCT 32.8 (L) 05/31/2020   MCV 79.5 05/31/2020   PLT 228.0 05/31/2020   Lab Results  Component Value Date   CREATININE 0.75 05/31/2020   BUN 15 05/31/2020   NA 139 05/31/2020   K 4.5 05/31/2020   CL 108 05/31/2020   CO2 23 05/31/2020   Hypomagnesemia Continue Mg Oxide 200 mg daily. Further recommendations according to Mg++ results.  Hypertension, essential, benign BP adequately controlled. Continue metoprolol succinate 25 mg daily, losartan 50 mg daily, and Amlodipine 10 mg daily. Low-salt  diet.  Atrial fibrillation, unspecified type (HCC) Continue Eliquis 5 mg twice daily and metoprolol succinate 25 mg daily. She has an appointment with cardiologist on 06/14/2020.  Iron deficiency anemia due to chronic blood loss Continue ferrous gluconate every other day. Further recommendation will be given according to CBC/iron results  Hypokalemia Continue KLOR 20 meq daily. Further recommendations according to K+ result.  Multiple gastric ulcers Continue Protonix 40 mg daily. GERD precautions. Following with GI.  Cecum mass Planning on having it removed. Following with GI.   Return in about 4 months (around 09/30/2020) for Anxiety,HTN,anemia.   Linda Glass G. [name], MD  [facility]. [place] office.    A few things to remember from today's visit:  Hypertension, essential, benign - Plan: Basic metabolic panel  Atrial fibrillation, unspecified type ([place])  Iron deficiency anemia due to chronic blood loss - Plan: CBC, Ferritin, Iron  Hypokalemia  If you need refills please call your pharmacy. Do not use My Chart to request refills or for acute issues that need immediate attention.   No changes today. Keep appt with heart doctor.  Please be sure medication list is accurate. If a new problem present, please set up appointment sooner than planned today.

## 2020-05-31 NOTE — Patient Instructions (Addendum)
A few things to remember from today's visit:  Hypertension, essential, benign - Plan: Basic metabolic panel  Atrial fibrillation, unspecified type ([place])  Iron deficiency anemia due to chronic blood loss - Plan: CBC, Ferritin, Iron  Hypokalemia  If you need refills please call your pharmacy. Do not use My Chart to request refills or for acute issues that need immediate attention.   No changes today. Keep appt with heart doctor.  Please be sure medication list is accurate. If a new problem present, please set up appointment sooner than planned today.

## 2020-06-01 LAB — BASIC METABOLIC PANEL
BUN: 15 mg/dL (ref 6–23)
CO2: 23 mEq/L (ref 19–32)
Calcium: 9.3 mg/dL (ref 8.4–10.5)
Chloride: 108 mEq/L (ref 96–112)
Creatinine, Ser: 0.75 mg/dL (ref 0.40–1.20)
GFR: 72.91 mL/min (ref >60.00)
Glucose, Bld: 100 mg/dL — ABNORMAL HIGH (ref 70–99)
Potassium: 4.5 mEq/L (ref 3.5–5.1)
Sodium: 139 mEq/L (ref 135–145)

## 2020-06-01 LAB — CBC
HCT: 32.8 % — ABNORMAL LOW (ref 36.0–46.0)
Hemoglobin: 10.3 g/dL — ABNORMAL LOW (ref 12.0–15.0)
MCHC: 31.3 g/dL (ref 30.0–36.0)
MCV: 79.5 fl (ref 78.0–100.0)
Platelets: 228 10*3/uL (ref 150.0–400.0)
RBC: 4.12 Mil/uL (ref 3.87–5.11)
RDW: 26.8 % — ABNORMAL HIGH (ref 11.5–15.5)
WBC: 7 10*3/uL (ref 4.0–10.5)

## 2020-06-01 LAB — FERRITIN: Ferritin: 33.7 ng/mL (ref 10.0–291.0)

## 2020-06-01 LAB — IRON: Iron: 113 ug/dL (ref 42–145)

## 2020-06-01 LAB — MAGNESIUM: Magnesium: 1.4 mg/dL — ABNORMAL LOW (ref 1.5–2.5)

## 2020-06-02 ENCOUNTER — Encounter: Payer: Self-pay | Admitting: Family Medicine

## 2020-06-02 ENCOUNTER — Other Ambulatory Visit: Payer: Self-pay | Admitting: Family Medicine

## 2020-06-02 DIAGNOSIS — I4891 Unspecified atrial fibrillation: Secondary | ICD-10-CM

## 2020-06-02 DIAGNOSIS — F419 Anxiety disorder, unspecified: Secondary | ICD-10-CM

## 2020-06-02 DIAGNOSIS — I10 Essential (primary) hypertension: Secondary | ICD-10-CM

## 2020-06-02 DIAGNOSIS — R000 Tachycardia, unspecified: Secondary | ICD-10-CM

## 2020-06-02 MED ORDER — FUROSEMIDE 40 MG PO TABS: 40.0000 mg | ORAL_TABLET | Freq: Every day | ORAL | 0 refills | Status: DC

## 2020-06-02 MED ORDER — METOPROLOL SUCCINATE ER 25 MG PO TB24: 25.0000 mg | ORAL_TABLET | Freq: Every day | ORAL | 2 refills | Status: DC

## 2020-06-02 MED ORDER — CITALOPRAM HYDROBROMIDE 10 MG PO TABS
10.0000 mg | ORAL_TABLET | Freq: Every day | ORAL | 3 refills | Status: DC
Start: 2020-06-02 — End: 2021-08-15

## 2020-06-02 MED ORDER — LOSARTAN POTASSIUM 50 MG PO TABS: 50.0000 mg | ORAL_TABLET | Freq: Every day | ORAL | 2 refills | Status: DC

## 2020-06-02 MED ORDER — APIXABAN 5 MG PO TABS: 5.0000 mg | ORAL_TABLET | Freq: Two times a day (BID) | ORAL | 6 refills | Status: DC

## 2020-06-02 MED ORDER — AMLODIPINE BESYLATE 10 MG PO TABS: 10.0000 mg | ORAL_TABLET | Freq: Every day | ORAL | 2 refills | Status: DC

## 2020-06-02 MED ORDER — POTASSIUM CHLORIDE CRYS ER 20 MEQ PO TBCR: 20.0000 meq | EXTENDED_RELEASE_TABLET | Freq: Every day | ORAL | 2 refills | Status: DC

## 2020-06-02 MED ORDER — PANTOPRAZOLE SODIUM 40 MG PO TBEC: DELAYED_RELEASE_TABLET | ORAL | 0 refills | Status: DC

## 2020-06-05 ENCOUNTER — Ambulatory Visit: Payer: Medicare Other | Admitting: Nurse Practitioner

## 2020-06-11 NOTE — Progress Notes (Signed)
Cardiology Office Note:    Date:  06/14/2020   ID:  Linda Glass, DOB 1933/08/03, MRN [id number]  PCP:  [name], [name], MD  Cardiologist:  Linda Latch, MD  Electrophysiologist:  None   Referring MD: [name], [name], MD   Chief Complaint: hospital follow-up of atrial fibrillation   History of Present Illness:    Linda Glass is a 87 y.o. female with a history of newly diagnosed atrial fibrillation in the setting of acute anemia, chronic diastolic CHF, hypertension, hyperlipidemia, arthritis, and anxiety who is followed by Dr. Oval Glass and presents today for hospital follow-up of atrial fibrillation.  Patient was first seen by Cardiology during recent admission. She was admitted from 05/01/2020 to 05/12/2020 for acute anemia with hemoglobin of 5.4 after presenting with severe fatigue and hypersomnolence. She was transfused a total of 4 units of PRBCs during hospitalization. ED showed a few non-bleeding cratered gastric ulcers with biopsy showing focal intestinal metaplasia. Negative for H. Pylori or malignancy. Colonoscopy showed a polypoid large mass ileocecal valve without bleeding. Biopsy consistent with tubular adenoma with no high-grade dysplasia or malignancy. GI recommended Protonix and Carafate. She was also noted to be in atrial fibrillation and felt to be volume overloaded on admission. Echo showed LVEF of 60-65% with normal wall motion, moderate LVH, and mild MR. She was started on Toprol-XL and remained rate controlled. CHA2DS2-VASc score =4. Once given the OK by GI, she was started on Eliquis. She was diuresed with IV Lasix and then transitioned to PO Lasix 40mg  daily. Discharge weight 172 lbs. She was also to have significant hypokalemia during admission. Potassium was 3.4 on admission but dropped as low as 2.6. Supplement and potassium 3.9 on discharge. Patient also tested positive for COVID-19 on admission and was treated with Remdesivir.  Patient was  seen for follow-up at PCP's office on 05/31/2020. Repeat labs were draw and showed stable hemoglobin of 10.3 and iron level was in normal range. Creatinine stable at 0.75 and Potassium normal at 4.5. Magnesium was still slightly low at 1.5 so magnesium oxide was increased to 400mg  daily.   Patient presents today for follow-up. Patient here with a neighbor. She has done very well from a cardiac standpoint. No recurrent shortness of breath or lower extremity edema. No orthopnea or PND. No chest pain, palpitations, lightheadedness, dizziness, or near syncope/syncope. She denies any abnormal bleeding on the Eliquis. Her stools are dark because she is on Iron but she denies any obvious blood in urine or stools.  Past Medical History:  Diagnosis Date  . Anxiety   . Arthritis   . Chicken pox   . COVID-19   . Heart murmur   . Hyperlipidemia     Past Surgical History:  Procedure Laterality Date  . ABDOMINAL HYSTERECTOMY  1983  . BIOPSY  05/05/2020   Procedure: BIOPSY;  Surgeon: Mauri Pole, MD;  Location: [facility];  Service: Endoscopy;;  EGD and COLON  . COLONOSCOPY WITH PROPOFOL N/A 05/05/2020   Procedure: COLONOSCOPY WITH PROPOFOL;  Surgeon: Mauri Pole, MD;  Location: [facility];  Service: Endoscopy;  Laterality: N/A;  . ESOPHAGOGASTRODUODENOSCOPY (EGD) WITH PROPOFOL N/A 05/05/2020   Procedure: ESOPHAGOGASTRODUODENOSCOPY (EGD) WITH PROPOFOL;  Surgeon: Mauri Pole, MD;  Location: [facility];  Service: Endoscopy;  Laterality: N/A;  . FRACTURE SURGERY     wrist fracture 2013    Current Medications: Current Meds  Medication Sig  . amLODipine (NORVASC) 10 MG tablet Take 1 tablet (10 mg total)  by mouth daily.  Marland Kitchen apixaban (ELIQUIS) 5 MG TABS tablet Take 1 tablet (5 mg total) by mouth 2 (two) times daily.  Marland Kitchen atorvastatin (LIPITOR) 40 MG tablet TAKE 1 TABLET EACH DAY. (Patient taking differently: Take 40 mg by mouth daily.)  . citalopram (CELEXA) 10 MG tablet Take 1  tablet (10 mg total) by mouth daily.  . Cyanocobalamin (VITAMIN B 12 PO) Take 1 tablet by mouth daily.  . Ferrous Gluconate 324 (37.5 Fe) MG TABS Take 1 tablet by mouth every other day.  Marland Kitchen LORazepam (ATIVAN) 0.5 MG tablet Take 0.5 tablets (0.25 mg total) by mouth daily as needed for anxiety.  Marland Kitchen losartan (COZAAR) 50 MG tablet Take 1 tablet (50 mg total) by mouth daily.  . magnesium oxide (MAG-OX) 400 (241.3 Mg) MG tablet Take 0.5 tablets (200 mg total) by mouth daily.  . metoprolol succinate (TOPROL-XL) 25 MG 24 hr tablet Take 1 tablet (25 mg total) by mouth daily.  . Multiple Vitamin (MULTIVITAMIN WITH MINERALS) TABS tablet Take 1 tablet by mouth daily.  . pantoprazole (PROTONIX) 40 MG tablet Take 1 tablet (40 mg total) by mouth 2 (two) times daily for 90 days, THEN 1 tablet (40 mg total) daily.  . potassium chloride SA (KLOR-CON) 20 MEQ tablet Take 1 tablet (20 mEq total) by mouth daily.  . [DISCONTINUED] furosemide (LASIX) 40 MG tablet Take 1 tablet (40 mg total) by mouth daily.     Allergies:   Patient has no known allergies.   Social History   Socioeconomic History  . Marital status: Widowed    Spouse name: Not on file  . Number of children: Not on file  . Years of education: Not on file  . Highest education level: Not on file  Occupational History  . Not on file  Tobacco Use  . Smoking status: Never Smoker  . Smokeless tobacco: Never Used  Substance and Sexual Activity  . Alcohol use: Yes    Comment: Occasional  . Drug use: No  . Sexual activity: Not Currently  Other Topics Concern  . Not on file  Social History Narrative  . Not on file   Social Determinants of Health   Financial Resource Strain: Not on file  Food Insecurity: Not on file  Transportation Needs: Not on file  Physical Activity: Not on file  Stress: Not on file  Social Connections: Not on file     Family History: The patient's family history is negative for Diabetes.  ROS:   Please see the history  of present illness.     EKGs/Labs/Other Studies Reviewed:    The following studies were reviewed today:  Echocardiogram 05/02/2020: Impressions: 1. Left ventricular ejection fraction, by estimation, is 60 to 65%. The left ventricle has normal function. The left ventricle has no regional wall motion abnormalities. There is moderate left ventricular hypertrophy. Left ventricular diastolic function could not be evaluated.  2. Right ventricular systolic function is normal. The right ventricular size is normal. There is moderately elevated pulmonary artery systolic pressure. The estimated right ventricular systolic pressure is 62.9 mmHg.  3. Left atrial size was mildly dilated.  4. Right atrial size was mildly dilated.  5. The mitral valve is normal in structure. Mild mitral valve  regurgitation.  6. The aortic valve is calcified. There is mild calcification of the aortic valve. There is mild thickening of the aortic valve. Aortic valve regurgitation is not visualized. No aortic stenosis is present.  7. The inferior vena cava is dilated  in size with <50% respiratory variability, suggesting right atrial pressure of 15 mmHg.    EKG:  EKG ordered today. EKG personally reviewed and demonstrates: Atrial fibrillation, rate 80 bpm, with incomplete RBBB, Q waves in V1-V2, and no acute ST/T changes.  Recent Labs: 05/01/2020: Pro B Natriuretic peptide (BNP) 599.0; TSH 2.89 05/02/2020: B Natriuretic Peptide 596.4 05/06/2020: ALT 14 05/31/2020: BUN 15; Creatinine, Ser 0.75; Hemoglobin 10.3; Magnesium 1.4; Platelets 228.0; Potassium 4.5; Sodium 139  Recent Lipid Panel    Component Value Date/Time   CHOL 122 02/11/2020 1103   TRIG 145 02/11/2020 1103   HDL 63 02/11/2020 1103   CHOLHDL 1.9 02/11/2020 1103   VLDL 72.8 (H) 08/21/2016 1014   LDLCALC 36 02/11/2020 1103   LDLDIRECT 120.0 08/26/2017 1005    Physical Exam:    Vital Signs: BP (!) 150/71   Pulse 80   Ht 5\' 1"  (1.549 m)   Wt 164 lb (74.4  kg)   SpO2 97%   BMI 30.99 kg/m     Wt Readings from Last 3 Encounters:  06/14/20 164 lb (74.4 kg)  05/31/20 162 lb 4 oz (73.6 kg)  05/30/20 162 lb 4 oz (73.6 kg)     General: 87 y.o. Caucasian female in no acute distress. HEENT: Normocephalic and atraumatic. Sclera clear.  Neck: Supple. No carotid bruits. No JVD. Heart: Irregularly irregular rhythm with normal rate. Soft murmur. No gallops or rubs. Radial pulses 2+ and equal bilaterally. Lungs: No increased work of breathing. Clear to ausculation bilaterally. No wheezes, rhonchi, or rales.  Abdomen: Soft, non-distended, and non-tender to palpation.  Extremities: Trace lower extremity edema.    Skin: Warm and dry. Neuro: Alert and oriented x3. No focal deficits. Psych: Normal affect. Responds appropriately.  Assessment:    1. Persistent atrial fibrillation ([place])   2. Chronic diastolic CHF (congestive heart failure) ([place])   3. Primary hypertension   4. Hyperlipidemia, unspecified hyperlipidemia type     Plan:    Persistent Atrial Fibrillation - Recently diagnosed with new onset atrial fibrillation in setting of acute anemia. - Still in atrial fibrillation at visit today but rate controlled. Rate in the 80's. - Continue Toprol-XL 25mg  daily. - Continue Eliquis 5mg  twice daily. CBC on 05/31/2020 showed stable hemoglobin of 10.3. - Discussed different treatment options including rate control, adding antiarrhymic, or DCCV. Patient is asymptomatic and does not want to be on any other medications. Would also like to avoid any procedures if they are not necessary. Given her age and problems with GI bleeding/anemia, I think this is very reasonable. So will plan for rate control strategy at his time.  Chronic Diastolic CHF - Patient felt to have acute CHF exacerbation during recent admission for anemia and atrial fibrillation.  - Echo showed LVEF of 60-65% with normal wall motion, moderate LVH, mild MR, and moderately elevated PASP of  53.9 mmHg. - Appears euvolemic on exam.  - Continue Lasix 40mg  daily. - Advised patient to let us know if she know is she starts to accumulate fluid again. - Renal function and electrolytes stable on labs at PCP's office on 05/31/2020.  Hypertension - BP elevated in the office. However, she states it is normally well controlled at home (110s-130's/60's-70's). - Continue current medications: Amlodipine 10mg  daily, Losartan 50mg  daily, and Toprol-XL 25mg  daily. - Advised patient to continue to monitor BP at home and let us know if consistently >130/80.   Hyperlipidemia - Lipid panel in 01/2020: Total Cholesterol 122, Triglycerides 145, HDL 63,  LDL 36.  - Continue Lipitor 40mg  daily.   Disposition: Follow up in 3-4 months with Dr. Oval Glass.   Medication Adjustments/Labs and Tests Ordered: Current medicines are reviewed at length with the patient today.  Concerns regarding medicines are outlined above.  Orders Placed This Encounter  Procedures  . EKG 12-Lead   Meds ordered this encounter  Medications  . furosemide (LASIX) 40 MG tablet    Sig: Take 1 tablet (40 mg total) by mouth daily.    Dispense:  90 tablet    Refill:  1    Patient Instructions  Medication Instructions:  Your physician recommends that you continue on your current medications as directed. Please refer to the Current Medication list given to you today.  *If you need a refill on your cardiac medications before your next appointment, please call your pharmacy*  Follow-Up: At [facility], you and your health needs are our priority.  As part of our continuing mission to provide you with exceptional heart care, we have created designated Provider Care Teams.  These Care Teams include your primary Cardiologist (physician) and Advanced Practice Providers (APPs -  Physician Assistants and Nurse Practitioners) who all work together to provide you with the care you need, when you need it.  We recommend signing up for the  patient portal called "MyChart".  Sign up information is provided on this After Visit Summary.  MyChart is used to connect with patients for Virtual Visits (Telemedicine).  Patients are able to view lab/test results, encounter notes, upcoming appointments, etc.  Non-urgent messages can be sent to your provider as well.   To learn more about what you can do with MyChart, go to [email or web address].    Your next appointment:   3 month(s)  The format for your next appointment:   In Person  Provider:   You may see Linda Latch, MD or one of the following Advanced Practice Providers on your designated Care Team:    [place], PA-C  Fabian Sharp, PA-C or   Roby Lofts, PA-C  [address] [place], [place] [address]        Signed, Darreld Mclean, Hershal Coria  06/14/2020 12:24 PM    [place]

## 2020-06-14 ENCOUNTER — Encounter: Payer: Self-pay | Admitting: Student

## 2020-06-14 ENCOUNTER — Ambulatory Visit: Payer: Medicare Other | Admitting: Student

## 2020-06-14 ENCOUNTER — Other Ambulatory Visit: Payer: Self-pay

## 2020-06-14 VITALS — BP 150/71 | HR 80 | Ht 61.0 in | Wt 164.0 lb

## 2020-06-14 DIAGNOSIS — I5032 Chronic diastolic (congestive) heart failure: Secondary | ICD-10-CM

## 2020-06-14 DIAGNOSIS — E785 Hyperlipidemia, unspecified: Secondary | ICD-10-CM

## 2020-06-14 DIAGNOSIS — I10 Essential (primary) hypertension: Secondary | ICD-10-CM

## 2020-06-14 DIAGNOSIS — I4819 Other persistent atrial fibrillation: Secondary | ICD-10-CM | POA: Diagnosis not present

## 2020-06-14 MED ORDER — FUROSEMIDE 40 MG PO TABS: 40.0000 mg | ORAL_TABLET | Freq: Every day | ORAL | 1 refills | Status: DC

## 2020-06-14 NOTE — Patient Instructions (Signed)
Medication Instructions:  Your physician recommends that you continue on your current medications as directed. Please refer to the Current Medication list given to you today.  *If you need a refill on your cardiac medications before your next appointment, please call your pharmacy*  Follow-Up: At [facility], you and your health needs are our priority.  As part of our continuing mission to provide you with exceptional heart care, we have created designated Provider Care Teams.  These Care Teams include your primary Cardiologist (physician) and Advanced Practice Providers (APPs -  Physician Assistants and Nurse Practitioners) who all work together to provide you with the care you need, when you need it.  We recommend signing up for the patient portal called "MyChart".  Sign up information is provided on this After Visit Summary.  MyChart is used to connect with patients for Virtual Visits (Telemedicine).  Patients are able to view lab/test results, encounter notes, upcoming appointments, etc.  Non-urgent messages can be sent to your provider as well.   To learn more about what you can do with MyChart, go to [email or web address].    Your next appointment:   3 month(s)  The format for your next appointment:   In Person  Provider:   You may see [name], MD or one of the following Advanced Practice Providers on your designated Care Team:    [place], PA-C  Fabian Sharp, PA-C or   Roby Lofts, PA-C  [address] [place], [place] [address]

## 2020-06-19 NOTE — Progress Notes (Signed)
Reviewed and agree with documentation and assessment and plan. K. Veena Sheneika Walstad , MD   

## 2020-06-23 ENCOUNTER — Telehealth: Payer: Self-pay | Admitting: Family Medicine

## 2020-06-23 NOTE — Telephone Encounter (Signed)
FYI- completed evaluation, no need for OT needed at this time.

## 2020-06-28 ENCOUNTER — Telehealth: Payer: Self-pay | Admitting: Family Medicine

## 2020-06-28 NOTE — Telephone Encounter (Signed)
Verbal given to [place].

## 2020-06-28 NOTE — Telephone Encounter (Signed)
Linda Glass is calling requesting verbal orders to continue physical therapy for 1 week 5 to start 5/9, please advise. CB [phone]

## 2020-07-18 ENCOUNTER — Telehealth: Payer: Self-pay

## 2020-07-18 NOTE — Telephone Encounter (Signed)
Called the preferred number. No answer. No voicemail. Saved an appointment for her on 08/15/20.

## 2020-07-18 NOTE — Telephone Encounter (Signed)
-----   Message from Mauri Pole, MD sent at 07/17/2020 11:05 AM EDT ----- Regarding: RE: Follow-up Beth, can you please set her up with a follow up appointment with me soon so I can discuss with patient the options in detail.  Chester Holstein, it is an extensive lesion and I will let you know once I understand what patient prefers.  Thank you for the reminder Veena ----- Message ----- From: Irving Copas., MD Sent: 07/14/2020   9:32 PM EDT To: Willia Craze, NP, Mauri Pole, MD Subject: Follow-up                                      KVN and PG, I was just going through my list of potential colonoscopies/referrals and I saw this patient on the list.  Did not know what/where we stand with this at this time.  Was the patient going to be referred to me to see and discuss the possibility of resection endoscopically.  I think if that is the case, I will be happy to see her but I would get a CT abdomen/pelvis to evaluate and ensure that there is no mass or lesion that is already too extensive for [place] even consider endoscopic resection. Please update me when able. Thanks. GM

## 2020-07-21 NOTE — Telephone Encounter (Signed)
Patient has viewed the appointment through her patient portal.

## 2020-08-15 ENCOUNTER — Ambulatory Visit: Payer: Medicare Other | Admitting: Gastroenterology

## 2020-08-29 ENCOUNTER — Other Ambulatory Visit: Payer: Self-pay | Admitting: Family Medicine

## 2020-09-20 ENCOUNTER — Telehealth: Payer: Self-pay | Admitting: Family Medicine

## 2020-09-20 ENCOUNTER — Other Ambulatory Visit: Payer: Self-pay | Admitting: Family Medicine

## 2020-09-20 NOTE — Chronic Care Management (AMB) (Signed)
  Chronic Care Management   Note  09/20/2020 Name: Myndi Schopf MRN: [id number] DOB: 1933-03-15  Mindi Junker Guglielmi is a 87 y.o. year old female who is a primary care patient of [name], Malka So, MD. I reached out to [name] by phone today in response to a referral sent by Ms. Mindi Junker Deats's PCP, [name], [name], MD.   Ms. Fritchman was given information about Chronic Care Management services today including:  CCM service includes personalized support from designated clinical staff supervised by her physician, including individualized plan of care and coordination with other care providers 24/7 contact phone numbers for assistance for urgent and routine care needs. Service will only be billed when office clinical staff spend 20 minutes or more in a month to coordinate care. Only one practitioner may furnish and bill the service in a calendar month. The patient may stop CCM services at any time (effective at the end of the month) by phone call to the office staff.   Patient agreed to services and verbal consent obtained.   Follow up plan:   Tatjana Secretary/administrator

## 2020-09-25 ENCOUNTER — Telehealth: Payer: Self-pay | Admitting: Pharmacist

## 2020-09-25 NOTE — Chronic Care Management (AMB) (Signed)
Chronic Care Management Pharmacy Assistant   Name: Linda Glass  MRN: [id number] DOB: August 03, 1933  Reason for Encounter: Chart review for CPP visit on 09-26-2020 patient advised she would like to do this when her daughter is in town she rescheduled for 10-05-20 @ 1pm via phone call   Conditions to be addressed/monitored: HTN, HLD, Anxiety, and Partial Atrial Fib  Recent office visits:  05-31-2020 [name], [name], MD (PCP) - Patient presented for Hypomagnesemia and other concerns. Discontinued Sucralfate 1g.  05-01-2020 [name], [name], MD (PCP) - Patient presented for Hypertension, essential, benign and other concerns. Prescribed Apixaban '5mg'$  and Furosemide '20mg'$ .  Recent consult visits:  06-14-2020 Eppie Gibson ( Cardiology) - Patient presented for Persistent atrial fibrillation and other concerns. No medication changes.  05-30-2020 Willia Craze, NP [facility]) - Patient presented for Cecum mass and other concerns. No medication changes.   Hospital visits:  Medication Reconciliation was completed by comparing discharge summary, patient's EMR and Pharmacy list, and upon discussion with patient.  Admitted to [facility] on 05-01-2020 due to Symptomatic anemia. Discharge date was 05-12-2020. Discharged from [place]?Medications Started at [facility] Discharge:?? -started Protpnix '40mg'$     Medication Changes at Hospital Discharge: -Changed Lipitor '40mg'$  once a day ; Furosemide '40mg'$  once a day; Lorazepam 0.'5mg'$  PRN  Medications Discontinued at Hospital Discharge: -Stopped None   Medications that remain the same after Hospital Discharge:??  -All other medications will remain the same.    Medications: Outpatient Encounter Medications as of 09/25/2020  Medication Sig   amLODipine (NORVASC) 10 MG tablet Take 1 tablet (10 mg total) by mouth daily.   apixaban (ELIQUIS) 5 MG TABS tablet Take 1 tablet (5 mg total) by mouth 2  (two) times daily.   atorvastatin (LIPITOR) 40 MG tablet TAKE 1 TABLET EACH DAY. (Patient taking differently: Take 40 mg by mouth daily.)   citalopram (CELEXA) 10 MG tablet Take 1 tablet (10 mg total) by mouth daily.   Cyanocobalamin (VITAMIN B 12 PO) Take 1 tablet by mouth daily.   Ferrous Gluconate 324 (37.5 Fe) MG TABS Take 1 tablet by mouth every other day.   furosemide (LASIX) 40 MG tablet Take 1 tablet (40 mg total) by mouth daily.   LORazepam (ATIVAN) 0.5 MG tablet Take 0.5 tablets (0.25 mg total) by mouth daily as needed for anxiety.   losartan (COZAAR) 50 MG tablet Take 1 tablet (50 mg total) by mouth daily.   magnesium oxide (MAG-OX) 400 (241.3 Mg) MG tablet Take 0.5 tablets (200 mg total) by mouth daily.   metoprolol succinate (TOPROL-XL) 25 MG 24 hr tablet Take 1 tablet (25 mg total) by mouth daily.   Multiple Vitamin (MULTIVITAMIN WITH MINERALS) TABS tablet Take 1 tablet by mouth daily.   pantoprazole (PROTONIX) 40 MG tablet Take 1 tablet (40 mg total) by mouth daily.   potassium chloride SA (KLOR-CON) 20 MEQ tablet TAKE ONE TABLET BY MOUTH ONCE DAILY   No facility-administered encounter medications on file as of 09/25/2020.   Have you seen any other providers since your last visit? **Patient reports none  Any changes in your medications or health? Patient reports none  Any side effects from any medications? Patient reports she is unsure feels like she is taking too many.   Do you have an symptoms or problems not managed by your medications? Patient reports none  Any concerns about your health right now? Yes, patient reports after her  hospital stay she has a few extra medications and is taking them all but unsure if she needs to be taking them all together.  Has your provider asked that you check blood pressure, blood sugar, or follow special diet at home? Patient reports no  Do you get any type of exercise on a regular basis? Patient reports she gets around well inside but not  really active she has a long driveway and is not able to get the trash cans so she has someone help, has a housekeeper and a landscaper.  Can you think of a goal you would like to reach for your health? Patient reports none just wants better understanding of taking her medications.  Do you have any problems getting your medications? Patient reports she loves her pharmacy and they are great and always on time with deliveries.  Is there anything that you would like to discuss during the appointment? Patient would like more education on her medications and need for them.  Patient aware to have available and supplements for appointment  Fill History  :  Eliquis 5 mg tablet 09/20/2020 30   atorvastatin 40 mg tablet 08/20/2020 90   citalopram 10 mg tablet 08/15/2020 90   furosemide 40 mg tablet 07/09/2020 90   lorazepam 0.5 mg tablet 08/20/2020 60   losartan 50 mg tablet 08/29/2020 90   metoprolol succinate ER 25 mg tablet,extended release 24 hr 09/04/2020 90   pantoprazole 40 mg tablet,delayed release 08/29/2020 90   potassium chloride ER 20 mEq tablet,extended release(part/cryst) 09/20/2020 30   amlodipine 10 mg tablet 08/29/2020 90   Care Gaps:  AWV last on 01-05-20 - MSG sent to Ramond Craver CMA to schedule. Zoster vaccine - Overdue COVID Booster #3 Therapist, music) - Overdue Flu Vaccine- Overdue  Star Rating Drugs:  Atorvastatin '40mg'$  - Last filled 08-20-2020 90DS at [place] '50mg'$  - Last filled 08-29-2020 90DS at [facility] Pharmacist Assistant [phone]

## 2020-09-26 ENCOUNTER — Telehealth: Payer: Medicare Other

## 2020-10-04 ENCOUNTER — Telehealth: Payer: Self-pay | Admitting: Pharmacist

## 2020-10-04 NOTE — Chronic Care Management (AMB) (Signed)
    Chronic Care Management Pharmacy Assistant   Name: Jonee Franke  MRN: [id number] DOB: 1933-08-15  10-04-2020- Patient called to remind of appointment with [name] Clinical Pharmacist on 10-05-2020 at 12:30 pm via phone call Notes: Appointment was changed to 12:30 from 1 pm on the 2nd  Patient aware of appointment date, time, and type of appointment. Patient aware to have all medications, supplements, blood pressure and blood sugar logs available for phone call.   Care Gaps: AWV last on 01-05-20 - MSG sent to Ramond Craver CMA to schedule. Zoster vaccine - Overdue COVID Booster #3 Therapist, music) - Overdue Flu Vaccine- Overdue  Star Rating Drug: Atorvastatin '40mg'$  - Last filled 08-20-2020 90DS at [facility] Losartan '50mg'$  - Last filled 08-29-2020 90DS at [facility]  Any gaps in medications fill history?  None   Medications: Outpatient Encounter Medications as of 10/04/2020  Medication Sig   amLODipine (NORVASC) 10 MG tablet Take 1 tablet (10 mg total) by mouth daily.   apixaban (ELIQUIS) 5 MG TABS tablet Take 1 tablet (5 mg total) by mouth 2 (two) times daily.   atorvastatin (LIPITOR) 40 MG tablet TAKE 1 TABLET EACH DAY. (Patient taking differently: Take 40 mg by mouth daily.)   citalopram (CELEXA) 10 MG tablet Take 1 tablet (10 mg total) by mouth daily.   Cyanocobalamin (VITAMIN B 12 PO) Take 1 tablet by mouth daily.   Ferrous Gluconate 324 (37.5 Fe) MG TABS Take 1 tablet by mouth every other day.   furosemide (LASIX) 40 MG tablet Take 1 tablet (40 mg total) by mouth daily.   LORazepam (ATIVAN) 0.5 MG tablet Take 0.5 tablets (0.25 mg total) by mouth daily as needed for anxiety.   losartan (COZAAR) 50 MG tablet Take 1 tablet (50 mg total) by mouth daily.   magnesium oxide (MAG-OX) 400 (241.3 Mg) MG tablet Take 0.5 tablets (200 mg total) by mouth daily.   metoprolol succinate (TOPROL-XL) 25 MG 24 hr tablet Take 1 tablet (25 mg total) by mouth daily.   Multiple Vitamin  (MULTIVITAMIN WITH MINERALS) TABS tablet Take 1 tablet by mouth daily.   pantoprazole (PROTONIX) 40 MG tablet Take 1 tablet (40 mg total) by mouth daily.   potassium chloride SA (KLOR-CON) 20 MEQ tablet TAKE ONE TABLET BY MOUTH ONCE DAILY   No facility-administered encounter medications on file as of 10/04/2020.     [place] Clinical Pharmacist Assistant [phone]

## 2020-10-05 ENCOUNTER — Ambulatory Visit (INDEPENDENT_AMBULATORY_CARE_PROVIDER_SITE_OTHER): Payer: Medicare Other | Admitting: Pharmacist

## 2020-10-05 ENCOUNTER — Ambulatory Visit: Payer: Self-pay

## 2020-10-05 DIAGNOSIS — I4891 Unspecified atrial fibrillation: Secondary | ICD-10-CM

## 2020-10-05 DIAGNOSIS — I10 Essential (primary) hypertension: Secondary | ICD-10-CM

## 2020-10-05 NOTE — Progress Notes (Signed)
Chronic Care Management Pharmacy Note  10/20/2020 Name:  Jaylia Pettus MRN:  [id number] DOB:  29-Jun-1933  Summary: BP mostly at goal < 140/90 per home and office readings LDL at goal < 70  Recommendations/Changes made from today's visit: -Recommended moving losartan to the evening to space out BP medications  Plan: Follow up BP assessment in 1-2 months   Subjective: Makhya Arave is an 87 y.o. year old female who is a primary patient of [name], Malka So, MD.  The CCM team was consulted for assistance with disease management and care coordination needs.    Engaged with patient by telephone for initial visit in response to provider referral for pharmacy case management and/or care coordination services.   Consent to Services:  The patient was given the following information about Chronic Care Management services today, agreed to services, and gave verbal consent: 1. CCM service includes personalized support from designated clinical staff supervised by the primary care provider, including individualized plan of care and coordination with other care providers 2. 24/7 contact phone numbers for assistance for urgent and routine care needs. 3. Service will only be billed when office clinical staff spend 20 minutes or more in a month to coordinate care. 4. Only one practitioner may furnish and bill the service in a calendar month. 5.The patient may stop CCM services at any time (effective at the end of the month) by phone call to the office staff. 6. The patient will be responsible for cost sharing (co-pay) of up to 20% of the service fee (after annual deductible is met). Patient agreed to services and consent obtained.  Patient Care Team: [name], [name], MD as PCP - General (Family Medicine) [name], MD as PCP - Cardiology (Cardiology) Viona Gilmore, [facility] as Pharmacist (Pharmacist)  Recent office visits: 05-31-2020 [name], [name], MD (PCP) - Patient  presented for Hypomagnesemia and other concerns. Discontinued Sucralfate 1g.  05-01-2020 [name], [name], MD (PCP) - Patient presented for Hypertension, essential, benign and other concerns. Prescribed Apixaban 56m and Furosemide 255m Decreased metoprolol to 12.5 mg daily.  Recent consult visits: 06-14-2020 [facility] - Patient presented for Persistent atrial fibrillation and other concerns. No medication changes.   05-30-2020 GuWillia CrazeNP (GGertie Fey- Patient presented for Cecum mass and other concerns. No medication changes.   Hospital visits: Medication Reconciliation was completed by comparing discharge summary, patient's EMR and Pharmacy list, and upon discussion with patient.   [facility] Sympt[facility] anemia. Discharge date was 05-12-2020. [facility] -started Protpnix 4095m   Medication Changes at Hospital Discharge: -Changed Lipitor 73m48mce a day ; Furosemide 73mg81me a day; Lorazepam 0.5mg P38m  Medications Discontinued at Hospital Discharge: -Stopped None    Medications that remain the same after Hospital Discharge:??  -All other medications will remain the same.   Objective:  Lab Results  Component Value Date   CREATININE 0.75 05/31/2020   BUN 15 05/31/2020   GFR 72.91 05/31/2020   GFRNONAA >60 05/11/2020   GFRAA 101 02/11/2020   NA 139 05/31/2020   K 4.5 05/31/2020   CALCIUM 9.3 05/31/2020   CO2 23 05/31/2020   GLUCOSE 100 (H) 05/31/2020    Lab Results  Component Value Date/Time   GFR 72.91 05/31/2020 03:12 PM   GFR 80.67 05/01/2020 03:00 PM  Last diabetic Eye exam: No results found for: HMDIABEYEEXA  Last diabetic Foot exam: No results found for: HMDIABFOOTEX   Lab Results  Component Value Date   CHOL 122 02/11/2020   HDL 63 02/11/2020   LDLCALC 36 02/11/2020   LDLDIRECT 120.0  08/26/2017   TRIG 145 02/11/2020   CHOLHDL 1.9 02/11/2020    Hepatic Function Latest Ref Rng & Units 05/06/2020 05/05/2020 05/04/2020  Total Protein 6.5 - 8.1 g/dL 5.7(L) 6.0(L) 5.9(L)  Albumin 3.5 - 5.0 g/dL 2.8(L) 3.0(L) 3.1(L)  AST 15 - 41 U/L '19 20 20  ' ALT 0 - 44 U/L '14 15 16  ' Alk Phosphatase 38 - 126 U/L 87 93 97  Total Bilirubin 0.3 - 1.2 mg/dL 1.5(H) 2.1(H) 2.0(H)    Lab Results  Component Value Date/Time   TSH 2.89 05/01/2020 03:00 PM   TSH 1.34 02/11/2020 11:03 AM    CBC Latest Ref Rng & Units 05/31/2020 05/11/2020 05/10/2020  WBC 4.0 - 10.5 K/uL 7.0 5.5 8.9  Hemoglobin 12.0 - 15.0 g/dL 10.3(L) 9.1(L) 9.4(L)  Hematocrit 36.0 - 46.0 % 32.8(L) 31.3(L) 32.4(L)  Platelets 150.0 - 400.0 K/uL 228.0 113(L) 149(L)    No results found for: VD25OH  Clinical ASCVD: Yes  The ASCVD Risk score Mikey Bussing DC Jr., et al., 2013) failed to calculate for the following reasons:   The 2013 ASCVD risk score is only valid for ages 10 to 68    Depression screen PHQ 2/9 02/11/2020 01/06/2019 08/26/2017  Decreased Interest 0 0 0  Down, Depressed, Hopeless 0 0 0  PHQ - 2 Score 0 0 0     CHA2DS2/VAS Stroke Risk Points  Current as of 7 minutes ago     5 >= 2 Points: High Risk  1 - 1.99 Points: Medium Risk  0 Points: Low Risk    Last Change:       Details    This score determines the patient's risk of having a stroke if the  patient has atrial fibrillation.       Points Metrics  1 Has Congestive Heart Failure:  Yes    Current as of 7 minutes ago  0 Has Vascular Disease:  No    Current as of 7 minutes ago  1 Has Hypertension:  Yes    Current as of 7 minutes ago  2 Age:  34    Current as of 7 minutes ago  0 Has Diabetes:  No    Current as of 7 minutes ago  0 Had Stroke:  No  Had TIA:  No  Had Thromboembolism:  No    Current as of 7 minutes ago  1 Female:  Yes    Current as of 7 minutes ago      Social History   Tobacco Use  Smoking Status Never  Smokeless Tobacco Never   BP  Readings from Last 3 Encounters:  06/14/20 (!) 150/71  05/31/20 120/70  05/30/20 108/60   Pulse Readings from Last 3 Encounters:  06/14/20 80  05/31/20 68  05/30/20 80   Wt Readings from Last 3 Encounters:  06/14/20 164 lb (74.4 kg)  05/31/20 162 lb 4 oz (73.6 kg)  05/30/20 162 lb 4 oz (73.6 kg)   BMI Readings from Last 3 Encounters:  06/14/20 30.99 kg/m  05/31/20 30.91 kg/m  05/30/20 30.91 kg/m    Assessment/Interventions: Review of patient past medical history, allergies, medications, health status, including review of consultants reports, laboratory and other test data, was performed as  part of comprehensive evaluation and provision of chronic care management services.   SDOH:  (Social Determinants of Health) assessments and interventions performed: Yes SDOH Interventions    Flowsheet Row Most Recent Value  SDOH Interventions   Financial Strain Interventions Intervention Not Indicated  Transportation Interventions Intervention Not Indicated      SDOH Screenings   Alcohol Screen: Not on file  Depression (PHQ2-9): Low Risk    PHQ-2 Score: 0  Financial Resource Strain: Low Risk    Difficulty of Paying Living Expenses: Not hard at all  Food Insecurity: Not on file  Housing: Not on file  Physical Activity: Not on file  Social Connections: Not on file  Stress: Not on file  Tobacco Use: Low Risk    Smoking Tobacco Use: Never   Smokeless Tobacco Use: Never  Transportation Needs: No Transportation Needs   Lack of Transportation (Medical): No   Lack of Transportation (Non-Medical): No   Patient gets up pretty early around 6:30-7am and then has breakfast and then takes her medications. Patient is still driving and will go to the store. Patient goes to bed after watching Jeopardy and then goes to bed around 8pm.  Patient still cooks for herself for 2-3 days at a time. Patient has been coking salmon, corn casserole, mixture of [place] and American meals and usually  chicken more than beef. She does eat vegetables with every meal and is eating a lot of tomatoes now. Patient is usually not eating dessert every day. She usually has yogurt or parfaits around 2-3 pm and sometimes ice cream or sherbert in the evening. She drinks water, sodas, 1 glass of wine and the diet lipton green tea.  Her daughter is visiting right now and they went to the farmers market. Her daughter and husband came on the 4th and he is taking after his family.   Patient walks around the stores and she is doing PT exercises sometimes throughout the week just about every other day. She helps with her stability and she denies other exercise.  Patient sleeps pretty well and uses 1/2 of the lorazepam tablet on nights that she needs it. She doesn't use it every night and she reports feeling rested.   Patient sure about the timing of her medications. She inquired if it was ok to dissolve the potassium tablets in water because she was having a hard time taking it. We discussed that this is ok and can consider the solution if needed. She feels she knows what her medications are for.    CCM Care Plan  No Known Allergies  Medications Reviewed Today     Reviewed by Viona Gilmore, [facility] (Pharmacist) on 10/05/20 at 36  Med List Status: <None>   Medication Order Taking? Sig Documenting Provider Last Dose Status Informant  amLODipine (NORVASC) 10 MG tablet [id number] Yes Take 1 tablet (10 mg total) by mouth daily. [name], [name], MD Taking Active   apixaban (ELIQUIS) 5 MG TABS tablet [id number] Yes Take 1 tablet (5 mg total) by mouth 2 (two) times daily. [name], [name], MD Taking Active   atorvastatin (LIPITOR) 40 MG tablet [id number] Yes TAKE 1 TABLET EACH DAY.  Patient taking differently: Take 40 mg by mouth daily.   [name], [name], MD Taking Active   citalopram (CELEXA) 10 MG tablet [id number] Yes Take 1 tablet (10 mg total) by mouth daily. [name], [name], MD Taking Active   Cyanocobalamin  (VITAMIN B 12 PO) [id number] Yes Take 500 mcg by  mouth daily. [provider] Taking Active Self  Ferrous Gluconate 324 (37.5 Fe) MG TABS [id number] Yes Take 1 tablet by mouth every other day. [provider] Taking Active   furosemide (LASIX) 40 MG tablet [id number] Yes Take 1 tablet (40 mg total) by mouth daily. Darreld Mclean, PA-C Taking Active   LORazepam (ATIVAN) 0.5 MG tablet [id number] Yes Take 0.5 tablets (0.25 mg total) by mouth daily as needed for anxiety. [place] Donnamarie Poag, DO Taking Active   losartan (COZAAR) 50 MG tablet [id number] Yes Take 1 tablet (50 mg total) by mouth daily. [name], [name], MD Taking Active   magnesium oxide (MAG-OX) 400 (241.3 Mg) MG tablet [id number] Yes Take 0.5 tablets (200 mg total) by mouth daily.  Patient taking differently: Take 400 mg by mouth daily.   [place] Donnamarie Poag, DO Taking Active   metoprolol succinate (TOPROL-XL) 25 MG 24 hr tablet [id number] Yes Take 1 tablet (25 mg total) by mouth daily. [name], [name], MD Taking Active   Multiple Vitamin (MULTIVITAMIN WITH MINERALS) TABS tablet [id number] Yes Take 1 tablet by mouth daily. [provider] Taking Active Self  pantoprazole (PROTONIX) 40 MG tablet [id number] Yes Take 1 tablet (40 mg total) by mouth daily. [name], [name], MD Taking Active   potassium chloride SA (KLOR-CON) 20 MEQ tablet [id number] Yes TAKE ONE TABLET BY MOUTH ONCE DAILY [name], [name], MD Taking Active             Patient Active Problem List   Diagnosis Date Noted   Multiple gastric ulcers    Cecum mass    2019 novel coronavirus disease (COVID-19) 05/02/2020   Persistent atrial fibrillation ([place]) 05/01/2020   Hypertension, essential, benign 07/05/2019   Anxiety disorder 07/05/2019   Insomnia disorder 08/21/2016   Class 1 obesity 08/21/2016   Hyperlipidemia, mixed 08/21/2016    Immunization History  Administered Date(s) Administered   Fluad Quad(high Dose 65+) 02/11/2020   Influenza, High Dose  Seasonal PF 03/18/2016, 01/06/2017, 03/09/2018   PFIZER(Purple Top)SARS-COV-2 Vaccination 05/06/2019, 05/27/2019   Pneumococcal Polysaccharide-23 03/09/2018    Conditions to be addressed/monitored:  Hypertension, Hyperlipidemia, Atrial Fibrillation, Anxiety, and GI bleed  Care Plan : [place]  Updates made by Viona Gilmore, [place] since 10/20/2020 12:00 AM     Problem: Problem: Hypertension, Hyperlipidemia, Atrial Fibrillation, Anxiety, and GI bleed      Long-Range Goal: Patient-Specific Goal   Start Date: 10/05/2020  Expected End Date: 10/05/2021  This Visit's Progress: On track  Priority: High  Note:   Current Barriers:  Unable to independently monitor therapeutic efficacy Unable to maintain control of blood pressure  Pharmacist Clinical Goal(s):  Patient will achieve adherence to monitoring guidelines and medication adherence to achieve therapeutic efficacy maintain control of blood pressure as evidenced by home blood pressure readings  through collaboration with PharmD and provider.   Interventions: 1:1 collaboration with [name], [name], MD regarding development and update of comprehensive plan of care as evidenced by provider attestation and co-signature Inter-disciplinary care team collaboration (see longitudinal plan of care) Comprehensive medication review performed; medication list updated in electronic medical record  Hypertension (BP goal <140/90) -Not ideally controlled -Current treatment: Amlodipine 10 mg 1 tablet daily - in AM Metoprolol succinate 25 mg 1 tablet daily - in AM Losartan 50 mg 1 tablet daily - in AM -Medications previously tried: none  -Current home readings: not checking -Current dietary habits: uses lite salt and low sodium options; only eats out with  company or on a lunch date -Current exercise habits: no structured exercise -Denies hypotensive/hypertensive symptoms -Educated on BP goals and benefits of medications for  prevention of heart attack, stroke and kidney damage; Exercise goal of 150 minutes per week; Importance of home blood pressure monitoring; Proper BP monitoring technique; -Counseled to monitor BP at home weekly, document, and provide log at future appointments -Counseled on diet and exercise extensively Recommended to continue current medication Recommended moving losartan to the evening to space out blood pressure medications.  Hyperlipidemia: (LDL goal < 70) -Controlled -Current treatment: Atorvastatin 40 mg 1 tablet daily -Medications previously tried: none  -Current dietary patterns: does not eat fried foods; sautes foods with canola or olive oil -Current exercise habits: no structured exercise -Educated on Cholesterol goals;  Benefits of statin for ASCVD risk reduction; Importance of limiting foods high in cholesterol; Exercise goal of 150 minutes per week; -Counseled on diet and exercise extensively Recommended to continue current medication  Atrial Fibrillation (Goal: prevent stroke and major bleeding) -Controlled -CHADSVASC: 5 -Current treatment: Rate control: metoprolol succinate 25 mg 1 tablet daily Anticoagulation: Eliquis 5 mg 1 tablet twice daily -Medications previously tried: none -Home BP and HR readings: 131/68, 147/74; HR 77, 82 -Counseled on importance of adherence to anticoagulant exactly as prescribed; avoidance of NSAIDs due to increased bleeding risk with anticoagulants; -Recommended to continue current medication  Edema (Goal: manage symptoms and prevent exacerbations) -Controlled -Last ejection fraction: 60-65% (Date: 05/02/20) -Current treatment: Furosemide 40 mg 1 tablet daily  Potassium 20 mEq 1 tablet daily -Medications previously tried: none  -Current home BP/HR readings: refer to above -Current dietary habits: limits salt intake -Current exercise habits: no structured exercise -Educated on Proper diuretic administration and potassium  supplementation Importance of blood pressure control -Recommended to continue current medication Counseled on using compression stockings and elevating her feet.  Anxiety/insomnia (Goal: improve quality and quantity of sleep) -Controlled -Current treatment: Citalopram 10 mg daily Lorazepam 0.5 mg 1/2 tablet by mouth as needed -Medications previously tried/failed: none -PHQ9: 0 -GAD7: n/a -Educated on Benefits of medication for symptom control -Recommended to continue current medication  Ulcers/GI bleed  (Goal: prevent stomach bleeding) -Controlled -Current treatment  Ferrous gluconate 324 mg every other day Pantoprazole 40 mg 1 tablet daily -Medications previously tried: none  -Recommended to continue current medication  Health Maintenance -Vaccine gaps: shingrix, COVID booster, influenza -Current therapy:  Multivitamin 1 tablet daily Magnesium oxide 400 mg 1 tablet daily Vitamin B12 500 mcg daily  -Educated on Cost vs benefit of each product must be carefully weighed by individual consumer -Patient is satisfied with current therapy and denies issues -Recommended to continue current medication  Patient Goals/Self-Care Activities Patient will:  - take medications as prescribed check blood pressure a few times a week, document, and provide at future appointments target a minimum of 150 minutes of moderate intensity exercise weekly  Follow Up Plan: Telephone follow up appointment with care management team member scheduled for: 6 months       Medication Assistance: None required.  Patient affirms current coverage meets needs.  Compliance/Adherence/Medication fill history: Care Gaps: Shingrix, COVID booster (Dec 28th, 2021 - Pfizer, Kristopher Oppenheim), influenza   Star-Rating Drugs: Atorvastatin 78m - [id number]: [phone]:  [phone]  Uses pill box? Yes Pt endorses 100% compliance  We discussed: Current pharmacy is preferred with insurance plan and patient is satisfied with pharmacy services Patient decided to: Continue current medication management strategy  Care Plan and Follow Up Patient Decision:  Patient agrees to Care Plan and Follow-up.  Plan: Telephone follow up appointment with care management team member scheduled for:  6 months  [name], PharmD, [place] Pharmacist [place] at [place] [phone]

## 2020-10-12 ENCOUNTER — Ambulatory Visit (HOSPITAL_BASED_OUTPATIENT_CLINIC_OR_DEPARTMENT_OTHER): Payer: Medicare Other | Admitting: Cardiovascular Disease

## 2020-10-18 ENCOUNTER — Other Ambulatory Visit: Payer: Self-pay | Admitting: Family Medicine

## 2020-10-18 NOTE — Telephone Encounter (Signed)
Last filled 08/20/20

## 2020-10-20 NOTE — Patient Instructions (Addendum)
Hi Junia,  It was great to get to meet you over the telephone! Below is a summary of some of the topics we discussed.  Don't forget to move your losartan to the evening as we discussed. We will check back in on your blood pressures in a couple of months to see how things are going.  Please reach out to me if you have any questions or need anything!   Best, Maddie  [name], PharmD, [facility]   Visit Information   Goals Addressed             This Visit's Progress    Track and Manage My Blood Pressure-Hypertension       Timeframe:  Short-Term Goal Priority:  Medium Start Date:                             Expected End Date:                       Follow Up Date 04/16/21    - check blood pressure 3 times per week - choose a place to take my blood pressure (home, clinic or office, retail store) - write blood pressure results in a log or diary    Why is this important?   You won't feel high blood pressure, but it can still hurt your blood vessels.  High blood pressure can cause heart or kidney problems. It can also cause a stroke.  Making lifestyle changes like losing a little weight or eating less salt will help.  Checking your blood pressure at home and at different times of the day can help to control blood pressure.  If the doctor prescribes medicine remember to take it the way the doctor ordered.  Call the office if you cannot afford the medicine or if there are questions about it.     Notes:        Patient Care Plan: CCM Pharmacy Care Plan     Problem Identified: Problem: Hypertension, Hyperlipidemia, Atrial Fibrillation, Anxiety, and GI bleed      Long-Range Goal: Patient-Specific Goal   Start Date: 10/05/2020  Expected End Date: 10/05/2021  This Visit's Progress: On track  Priority: High  Note:   Current Barriers:  Unable to independently monitor therapeutic efficacy Unable to  maintain control of blood pressure  Pharmacist Clinical Goal(s):  Patient will achieve adherence to monitoring guidelines and medication adherence to achieve therapeutic efficacy maintain control of blood pressure as evidenced by home blood pressure readings  through collaboration with PharmD and provider.   Interventions: 1:1 collaboration with [name], [name], MD regarding development and update of comprehensive plan of care as evidenced by provider attestation and co-signature Inter-disciplinary care team collaboration (see longitudinal plan of care) Comprehensive medication review performed; medication list updated in electronic medical record  Hypertension (BP goal <140/90) -Not ideally controlled -Current treatment: Amlodipine 10 mg 1 tablet daily - in AM Metoprolol succinate 25 mg 1 tablet daily - in AM Losartan 50 mg 1 tablet daily - in AM -Medications previously tried: none  -Current home readings: not checking -Current dietary habits: uses lite salt and low sodium options; only eats out with company or on a lunch date -Current exercise habits: no structured exercise -Denies hypotensive/hypertensive symptoms -Educated on BP goals and benefits of medications for prevention of heart attack, stroke and kidney damage; Exercise goal of 150 minutes  per week; Importance of home blood pressure monitoring; Proper BP monitoring technique; -Counseled to monitor BP at home weekly, document, and provide log at future appointments -Counseled on diet and exercise extensively Recommended to continue current medication Recommended moving losartan to the evening to space out blood pressure medications.  Hyperlipidemia: (LDL goal < 70) -Controlled -Current treatment: Atorvastatin 40 mg 1 tablet daily -Medications previously tried: none  -Current dietary patterns: does not eat fried foods; sautes foods with canola or olive oil -Current exercise habits: no structured exercise -Educated on  Cholesterol goals;  Benefits of statin for ASCVD risk reduction; Importance of limiting foods high in cholesterol; Exercise goal of 150 minutes per week; -Counseled on diet and exercise extensively Recommended to continue current medication  Atrial Fibrillation (Goal: prevent stroke and major bleeding) -Controlled -CHADSVASC: 5 -Current treatment: Rate control: metoprolol succinate 25 mg 1 tablet daily Anticoagulation: Eliquis 5 mg 1 tablet twice daily -Medications previously tried: none -Home BP and HR readings: 131/68, 147/74; HR 77, 82 -Counseled on importance of adherence to anticoagulant exactly as prescribed; avoidance of NSAIDs due to increased bleeding risk with anticoagulants; -Recommended to continue current medication  Edema (Goal: manage symptoms and prevent exacerbations) -Controlled -Last ejection fraction: 60-65% (Date: 05/02/20) -Current treatment: Furosemide 40 mg 1 tablet daily  Potassium 20 mEq 1 tablet daily -Medications previously tried: none  -Current home BP/HR readings: refer to above -Current dietary habits: limits salt intake -Current exercise habits: no structured exercise -Educated on Proper diuretic administration and potassium supplementation Importance of blood pressure control -Recommended to continue current medication Counseled on using compression stockings and elevating her feet.  Anxiety/insomnia (Goal: improve quality and quantity of sleep) -Controlled -Current treatment: Citalopram 10 mg daily Lorazepam 0.5 mg 1/2 tablet by mouth as needed -Medications previously tried/failed: none -PHQ9: 0 -GAD7: n/a -Educated on Benefits of medication for symptom control -Recommended to continue current medication  Ulcers/GI bleed  (Goal: prevent stomach bleeding) -Controlled -Current treatment  Ferrous gluconate 324 mg every other day Pantoprazole 40 mg 1 tablet daily -Medications previously tried: none  -Recommended to continue current  medication  Health Maintenance -Vaccine gaps: shingrix, COVID booster, influenza -Current therapy:  Multivitamin 1 tablet daily Magnesium oxide 400 mg 1 tablet daily Vitamin B12 500 mcg daily  -Educated on Cost vs benefit of each product must be carefully weighed by individual consumer -Patient is satisfied with current therapy and denies issues -Recommended to continue current medication  Patient Goals/Self-Care Activities Patient will:  - take medications as prescribed check blood pressure a few times a week, document, and provide at future appointments target a minimum of 150 minutes of moderate intensity exercise weekly  Follow Up Plan: Telephone follow up appointment with care management team member scheduled for: 6 months      Ms. Trudo was given information about Chronic Care Management services today including:  CCM service includes personalized support from designated clinical staff supervised by her physician, including individualized plan of care and coordination with other care providers 24/7 contact phone numbers for assistance for urgent and routine care needs. Standard insurance, coinsurance, copays and deductibles apply for chronic care management only during months in which we provide at least 20 minutes of these services. Most insurances cover these services at 100%, however patients may be responsible for any copay, coinsurance and/or deductible if applicable. This service may help you avoid the need for more expensive face-to-face services. Only one practitioner may furnish and bill the service in a calendar month. The patient  may stop CCM services at any time (effective at the end of the month) by phone call to the office staff.  Patient agreed to services and verbal consent obtained.   Patient verbalizes understanding of instructions provided today and agrees to view in [place].  Telephone follow up appointment with pharmacy team member scheduled for: 6  months  Viona Gilmore, [facility]

## 2020-10-27 ENCOUNTER — Ambulatory Visit (HOSPITAL_BASED_OUTPATIENT_CLINIC_OR_DEPARTMENT_OTHER): Payer: Medicare Other | Admitting: Cardiovascular Disease

## 2020-11-14 ENCOUNTER — Other Ambulatory Visit (INDEPENDENT_AMBULATORY_CARE_PROVIDER_SITE_OTHER): Payer: Medicare Other

## 2020-11-14 ENCOUNTER — Ambulatory Visit: Payer: Medicare Other | Admitting: Gastroenterology

## 2020-11-14 ENCOUNTER — Encounter: Payer: Self-pay | Admitting: Gastroenterology

## 2020-11-14 VITALS — BP 150/66 | HR 84 | Ht 60.75 in | Wt 168.5 lb

## 2020-11-14 DIAGNOSIS — D509 Iron deficiency anemia, unspecified: Secondary | ICD-10-CM

## 2020-11-14 DIAGNOSIS — K6389 Other specified diseases of intestine: Secondary | ICD-10-CM | POA: Diagnosis not present

## 2020-11-14 DIAGNOSIS — K635 Polyp of colon: Secondary | ICD-10-CM

## 2020-11-14 LAB — COMPREHENSIVE METABOLIC PANEL
ALT: 20 U/L (ref 0–35)
AST: 35 U/L (ref 0–37)
Albumin: 4.2 g/dL (ref 3.5–5.2)
Alkaline Phosphatase: 154 U/L — ABNORMAL HIGH (ref 39–117)
BUN: 8 mg/dL (ref 6–23)
CO2: 23 mEq/L (ref 19–32)
Calcium: 9.7 mg/dL (ref 8.4–10.5)
Chloride: 102 mEq/L (ref 96–112)
Creatinine, Ser: 0.66 mg/dL (ref 0.40–1.20)
GFR: 80.07 mL/min (ref >60.00)
Glucose, Bld: 105 mg/dL — ABNORMAL HIGH (ref 70–99)
Potassium: 4.2 mEq/L (ref 3.5–5.1)
Sodium: 137 mEq/L (ref 135–145)
Total Bilirubin: 1.6 mg/dL — ABNORMAL HIGH (ref 0.2–1.2)
Total Protein: 7.7 g/dL (ref 6.0–8.3)

## 2020-11-14 LAB — CBC WITH DIFFERENTIAL/PLATELET
Basophils Absolute: 0 10*3/uL (ref 0.0–0.1)
Basophils Relative: 0.1 % (ref 0.0–3.0)
Eosinophils Absolute: 0 10*3/uL (ref 0.0–0.7)
Eosinophils Relative: 0.3 % (ref 0.0–5.0)
HCT: 38.5 % (ref 36.0–46.0)
Hemoglobin: 12.6 g/dL (ref 12.0–15.0)
Lymphocytes Relative: 16.9 % (ref 12.0–46.0)
Lymphs Abs: 1.3 10*3/uL (ref 0.7–4.0)
MCHC: 32.8 g/dL (ref 30.0–36.0)
MCV: 102.6 fl — ABNORMAL HIGH (ref 78.0–100.0)
Monocytes Absolute: 0.5 10*3/uL (ref 0.1–1.0)
Monocytes Relative: 7.1 % (ref 3.0–12.0)
Neutro Abs: 5.7 10*3/uL (ref 1.4–7.7)
Neutrophils Relative %: 75.6 % (ref 43.0–77.0)
Platelets: 157 10*3/uL (ref 150.0–400.0)
RBC: 3.75 Mil/uL — ABNORMAL LOW (ref 3.87–5.11)
RDW: 14.7 % (ref 11.5–15.5)
WBC: 7.6 10*3/uL (ref 4.0–10.5)

## 2020-11-14 LAB — IBC + FERRITIN
Ferritin: 54.6 ng/mL (ref 10.0–291.0)
Iron: 79 ug/dL (ref 42–145)
Saturation Ratios: 19.9 % — ABNORMAL LOW (ref 20.0–50.0)
TIBC: 396.2 ug/dL (ref 250.0–450.0)
Transferrin: 283 mg/dL (ref 212.0–360.0)

## 2020-11-14 NOTE — Patient Instructions (Addendum)
Your provider has requested that you go to the basement level for lab work before leaving today. Press "B" on the elevator. The lab is located at the first door on the left as you exit the elevator.   We will refer you to [facility] and contact you with that appointment( Dr Zenia Resides on October 4th at [facility])  You have been scheduled for a CT scan of the abdomen and pelvis at [place] are scheduled on 11/21/2020 at [facility]. You should arrive 15 minutes prior to your appointment time for registration. Please follow the written instructions below on the day of your exam:  WARNING: IF YOU ARE ALLERGIC TO IODINE/X-RAY DYE, PLEASE NOTIFY RADIOLOGY IMMEDIATELY AT [place].  1) Do not eat or drink anything after 8am (4 hours prior to your test) 2) You have been given 2 bottles of oral contrast to drink. The solution may taste better if refrigerated, but do NOT add ice or any other liquid to this solution. Shake well before drinking.    Drink 1 bottle of contrast @ 10am (2 hours prior to your exam)  Drink 1 bottle of contrast @ 11am (1 hour prior to your exam)  You may take any medications as prescribed with a small amount of water, if necessary. If you take any of the following medications: METFORMIN, GLUCOPHAGE, GLUCOVANCE, AVANDAMET, RIOMET, FORTAMET, [place] MET, JANUMET, GLUMETZA or METAGLIP, you MAY be asked to HOLD this medication 48 hours AFTER the exam.  The purpose of you drinking the oral contrast is to aid in the visualization of your intestinal tract. The contrast solution may cause some diarrhea. Depending on your individual set of symptoms, you may also receive an intravenous injection of x-ray contrast/dye. Plan on being at [facility] for 30 minutes or longer, depending on the type of exam you are having performed.  This test typically takes 30-45 minutes to complete.  If you have any questions regarding your  exam or if you need to reschedule, you may call the CT department at [phone]  between the hours of 8:00 am and 5:00 pm, Monday-Friday.  Due to recent changes in healthcare laws, you may see the results of your imaging and laboratory studies on MyChart before your provider has had a chance to review them.  We understand that in some cases there may be results that are confusing or concerning to you. Not all laboratory results come back in the same time frame and the provider may be waiting for multiple results in order to interpret others.  Please give [place] 48 hours in order for your provider to thoroughly review all the results before contacting the office for clarification of your results.    If you are age 83 or older, your body mass index should be between 23-30. Your Body mass index is 32.1 kg/m. If this is out of the aforementioned range listed, please consider follow up with your Primary Care Provider.  If you are age 71 or younger, your body mass index should be between 19-25. Your Body mass index is 32.1 kg/m. If this is out of the aformentioned range listed, please consider follow up with your Primary Care Provider.   __________________________________________________________  The [place] [facility] providers would like to encourage you to use [facility] to communicate with providers for non-urgent requests or questions.  Due to long hold times on the telephone, sending your provider a message by [facility] may  be a faster and more efficient way to get a response.  Please allow 48 business hours for a response.  Please remember that this is for non-urgent requests.    I appreciate the  opportunity to care for you  Thank You   Harl Bowie , MD   ________________________________________________________________________

## 2020-11-14 NOTE — Progress Notes (Signed)
Linda Glass    [id number]    04-22-1933  Primary Care Physician:Jordan, Malka So, MD  Referring Physician: [name], [name], [place] [place],  [place] [address]   Chief complaint:  Cecal polyp, iron deficiency anemia  HPI:  87 year old very pleasant female here for follow-up visit to discuss management of large cecal polyp She underwent EGD and colonoscopy for evaluation of severe iron deficiency anemia while she was hospitalized with COVID and new onset A. fib in March 2022.  She is on chronic anticoagulation with Eliquis Denies any abdominal pain, decreased appetite, unintentional weight loss, melena or rectal bleeding Overall she feels her health is stable, she lives alone.  She is worried who would take care of her if she were to undergo surgery.  Her daughter lives in [place] state. She is accompanied by her next-door neighbor for this visit  05/05/20 EGD severe anemia -A 5 cm hiatal hernia was present. -Few non-bleeding cratered gastric ulcers with a clean ulcer base (Forrest Class III) were found in theprepyloric region of the stomach and at the pylorus. The largest lesion was 10 mm in largest dimension. Biopsies were taken with a cold forceps for histology. Biopsies were taken with a cold forceps for Helicobacter pylori testing   05/05/20 Colonoscopy for IDA A polypoid large mass was found at the ileocecal valve. The mass measured five cm in length. No bleeding was present. Biopsies were taken with a cold forceps for histology. Multiple small and large-mouthed diverticula were found in the sigmoid colon and descending colon./ Non-bleeding internal hemorrhoids were found during retroflexion. The hemorrhoids were small     FINAL MICROSCOPIC DIAGNOSIS:   A. STOMACH, BIOPSY:  - Reactive gastropathy with focal intestinal metaplasia.  - Warthin-Starry is negative for Helicobacter pylori.  - No dysplasia or malignancy.   B. ILLEOCECAL  VALVE MASS, BIOPSY:  - Tubular adenoma (x4 fragments).  - No high grade dysplasia or malignancy.    Outpatient Encounter Medications as of 11/14/2020  Medication Sig   amLODipine (NORVASC) 10 MG tablet Take 1 tablet (10 mg total) by mouth daily.   apixaban (ELIQUIS) 5 MG TABS tablet Take 1 tablet (5 mg total) by mouth 2 (two) times daily.   atorvastatin (LIPITOR) 40 MG tablet TAKE 1 TABLET EACH DAY. (Patient taking differently: Take 40 mg by mouth daily.)   citalopram (CELEXA) 10 MG tablet Take 1 tablet (10 mg total) by mouth daily.   Cyanocobalamin (VITAMIN B 12 PO) Take 500 mcg by mouth daily.   Ferrous Gluconate 324 (37.5 Fe) MG TABS Take 1 tablet by mouth every other day.   furosemide (LASIX) 40 MG tablet Take 1 tablet (40 mg total) by mouth daily.   LORazepam (ATIVAN) 0.5 MG tablet TAKE 1/2 TABLET AT BEDTIME AS NEEDED FOR ANXIETY.   losartan (COZAAR) 50 MG tablet Take 1 tablet (50 mg total) by mouth daily.   magnesium oxide (MAG-OX) 400 (241.3 Mg) MG tablet Take 0.5 tablets (200 mg total) by mouth daily. (Patient taking differently: Take 400 mg by mouth daily.)   metoprolol succinate (TOPROL-XL) 25 MG 24 hr tablet Take 1 tablet (25 mg total) by mouth daily.   Multiple Vitamin (MULTIVITAMIN WITH MINERALS) TABS tablet Take 1 tablet by mouth daily.   pantoprazole (PROTONIX) 40 MG tablet Take 1 tablet (40 mg total) by mouth daily.   potassium chloride SA (KLOR-CON) 20 MEQ tablet TAKE ONE TABLET BY MOUTH ONCE DAILY  No facility-administered encounter medications on file as of 11/14/2020.    Allergies as of 11/14/2020   (No Known Allergies)    Past Medical History:  Diagnosis Date   Anxiety    Arthritis    Chicken pox    COVID-19    Heart murmur    Hyperlipidemia     Past Surgical History:  Procedure Laterality Date   ABDOMINAL HYSTERECTOMY  1983   BIOPSY  05/05/2020   Procedure: BIOPSY;  Surgeon: Mauri Pole, MD;  Location: [facility];  Service: Endoscopy;;  EGD and  COLON   COLONOSCOPY WITH PROPOFOL N/A 05/05/2020   Procedure: COLONOSCOPY WITH PROPOFOL;  Surgeon: Mauri Pole, MD;  Location: [facility];  Service: Endoscopy;  Laterality: N/A;   ESOPHAGOGASTRODUODENOSCOPY (EGD) WITH PROPOFOL N/A 05/05/2020   Procedure: ESOPHAGOGASTRODUODENOSCOPY (EGD) WITH PROPOFOL;  Surgeon: Mauri Pole, MD;  Location: [facility];  Service: Endoscopy;  Laterality: N/A;   FRACTURE SURGERY     wrist fracture 2013    Family History  Problem Relation Age of Onset   Diabetes Neg Hx     Social History   Socioeconomic History   Marital status: Widowed    Spouse name: Not on file   Number of children: 2   Years of education: Not on file   Highest education level: Not on file  Occupational History   Not on file  Tobacco Use   Smoking status: Never   Smokeless tobacco: Never  Substance and Sexual Activity   Alcohol use: Yes    Comment: Occasional   Drug use: No   Sexual activity: Not Currently  Other Topics Concern   Not on file  Social History Narrative   Not on file   Social Determinants of Health   Financial Resource Strain: Low Risk    Difficulty of Paying Living Expenses: Not hard at all  Food Insecurity: Not on file  Transportation Needs: No Transportation Needs   Lack of Transportation (Medical): No   Lack of Transportation (Non-Medical): No  Physical Activity: Not on file  Stress: Not on file  Social Connections: Not on file  Intimate Partner Violence: Not on file      Review of systems: All other review of systems negative except as mentioned in the HPI.   Physical Exam: Vitals:   11/14/20 0943  BP: (!) 150/66  Pulse: 84   Body mass index is 32.1 kg/m. Gen:      No acute distress HEENT:  sclera anicteric CV: Irregular heart rate, S1-S2 Lungs: Clear Abd:      soft, non-tender; no palpable masses, no distension Ext:    No edema Neuro: alert and oriented x 3 Psych: normal mood and affect  Data  Reviewed:  Reviewed labs, radiology imaging, old records and pertinent past GI work up   Assessment and Plan/Recommendations:  87 year old very pleasant female with history of A. fib, severe iron deficiency anemia who was noted to have large cecal polyp involving the ileocecal valve in March 2022. Biopsies showed tubular adenoma with no high-grade dysplasia or adenocarcinoma.  Follow-up CBC, CMP, iron panel Obtain CT abdomen and pelvis with contrast to further evaluate the cecal polyp, exclude any invasion or progression to cancer  Discussed in detail EMR versus surgical resection.  Given the location and the size she may need a prolonged colonoscopy or 2 sessions to completely remove the polyp.  Also discussed that there is possible sampling error with biopsies and possible progression of the polyp to invasive  adenocarcinoma Patient is willing to meet with surgeon to discuss surgical resection  GERD and hiatal hernia: Continue antireflux measures and Protonix daily  This visit required >40 minutes of patient care (this includes precharting, chart review, review of results, face-to-face time used for counseling as well as treatment plan and follow-up. The patient was provided an opportunity to ask questions and all were answered. The patient agreed with the plan and demonstrated an understanding of the instructions.  Damaris Hippo , MD    CC: [name], [name], MD

## 2020-11-17 ENCOUNTER — Encounter: Payer: Self-pay | Admitting: Gastroenterology

## 2020-11-17 ENCOUNTER — Ambulatory Visit: Payer: Medicare Other | Admitting: Family Medicine

## 2020-11-21 ENCOUNTER — Ambulatory Visit (HOSPITAL_COMMUNITY)
Admission: RE | Admit: 2020-11-21 | Discharge: 2020-11-21 | Disposition: A | Discharge status: Home / Self Care | Payer: Medicare Other | Source: Ambulatory Visit | Attending: Gastroenterology | Admitting: Gastroenterology

## 2020-11-21 ENCOUNTER — Other Ambulatory Visit: Payer: Self-pay

## 2020-11-21 ENCOUNTER — Encounter (HOSPITAL_COMMUNITY): Payer: Self-pay

## 2020-11-21 DIAGNOSIS — K6389 Other specified diseases of intestine: Secondary | ICD-10-CM | POA: Insufficient documentation

## 2020-11-21 DIAGNOSIS — K635 Polyp of colon: Secondary | ICD-10-CM | POA: Diagnosis present

## 2020-11-21 DIAGNOSIS — D509 Iron deficiency anemia, unspecified: Secondary | ICD-10-CM | POA: Diagnosis present

## 2020-11-21 HISTORY — DX: Essential (primary) hypertension: I10

## 2020-11-21 MED ORDER — IOHEXOL 350 MG/ML SOLN
80.0000 mL | Freq: Once | INTRAVENOUS | Status: AC | PRN
  Administered 2020-11-21: 80 mL via INTRAVENOUS

## 2020-11-27 ENCOUNTER — Ambulatory Visit: Payer: Medicare Other | Admitting: Family Medicine

## 2020-12-01 ENCOUNTER — Telehealth: Payer: Self-pay

## 2020-12-01 NOTE — Telephone Encounter (Signed)
Pt states she continues to take medication as prescribed & does not need a refill or assistance with any refills. She cannot  recall when she last refilled her Atorvastatin.

## 2020-12-07 ENCOUNTER — Telehealth (HOSPITAL_BASED_OUTPATIENT_CLINIC_OR_DEPARTMENT_OTHER): Payer: Self-pay | Admitting: *Deleted

## 2020-12-07 NOTE — Telephone Encounter (Signed)
   [facility] Pre-operative Risk Assessment    Patient Name: Linda Glass  DOB: [birth year] MRN: [id number]  HEARTCARE STAFF:  - IMPORTANT!!!!!! Under Visit Info/Reason for Call, type in Other and utilize the format Clearance MM/DD/YY or Clearance TBD. Do not use dashes or single digits. - Please review there is not already an duplicate clearance open for this procedure. - If request is for dental extraction, please clarify the # of teeth to be extracted. - If the patient is currently at the dentist's office, call Pre-Op Callback Staff (MA/nurse) to input urgent request.  - If the patient is not currently in the dentist office, please route to the Pre-Op pool.  Request for surgical clearance:  What type of surgery is being performed? Colectomy  When is this surgery scheduled? TBD  What type of clearance is required (medical clearance vs. Pharmacy clearance to hold med vs. Both)? Both  Are there any medications that need to be held prior to surgery and how long? Eliquis  Practice name and name of physician performing surgery? [facility] Dr Louanna Raw  What is the office phone number? [phone]   7.   What is the office fax number?        [phone]  8.   Anesthesia type (None, local, MAC, general) ? Samule Ohm, Jeralene Huff 12/07/2020, 4:22 PM  _________________________________________________________________   (provider comments below)

## 2020-12-08 NOTE — Telephone Encounter (Signed)
Pt has appt with Dr. Oval Linsey 01/05/21, will add pre op clearance to appt notes. Will forward clearance notes to MD for upcoming appt. Will send FYI to requesting office pt has appt.

## 2020-12-08 NOTE — Telephone Encounter (Signed)
Patient with diagnosis of A Fib on Eliquis for anticoagulation.    Procedure: colectomy Date of procedure: TBD   CHA2DS2-VASc Score = 6  This indicates a 9.7% annual risk of stroke. The patient's score is based upon: CHF History: 1 HTN History: 1 Diabetes History: 0 Stroke History: 0 Vascular Disease History: 1 (scattered calcifications seen on CT) Age Score: 2 Gender Score: 1    CrCl 58 mL.min using adjusted body weight Platelet count 157K   Per office protocol, patient can hold Eliquis for 2-3 days prior to procedure.    Patient will not need bridging with Lovenox (enoxaparin) around procedure. Marland Kitchen

## 2020-12-08 NOTE — Telephone Encounter (Signed)
Primary Willard, MD  Chart reviewed as part of pre-operative protocol coverage. Because of Linda Glass's past medical history and time since last visit, he/she will require a follow-up visit in order to better assess preoperative cardiovascular risk.  Pre-op covering staff: - Please schedule appointment and call patient to inform them. - Please contact requesting surgeon's office via preferred method (i.e, phone, fax) to inform them of need for appointment prior to surgery.  If applicable, this message will also be routed to pharmacy pool and/or primary cardiologist for input on holding anticoagulant/antiplatelet agent as requested below so that this information is available at time of patient's appointment.   Deberah Pelton, NP  12/08/2020, 7:27 AM

## 2020-12-18 ENCOUNTER — Other Ambulatory Visit: Payer: Self-pay | Admitting: Family Medicine

## 2021-01-04 NOTE — Progress Notes (Signed)
HPI: Chief Complaint  Patient presents with   Follow-up   [name] is a 87 y.o. female, who is here today with her daughter for chronic disease management. Last seen on 05/31/2020.  Hypertension:  Atrial fibrillation:Today she saw her cardiologist, DR Oval Linsey, because lower extremity edema Amlodipine will discontinue and spironolactone 25 mg added.  She is also on oral metoprolol succinate 25 mg daily and losartan 50 mg daily. She is on Eliquis 5 mg twice daily. Tolerating medication well. Negative for unusual or severe headache, visual changes, exertional chest pain, dyspnea, or focal weakness. LE edema seems to be improved early in the morning when she first gets up and gets worse as the day goes. Furosemide dose was also increased today from 40 mg daily to 80 mg daily.  Lab Results  Component Value Date   CREATININE 0.66 11/14/2020   BUN 8 11/14/2020   NA 137 11/14/2020   K 4.2 11/14/2020   CL 102 11/14/2020   CO2 23 11/14/2020   Hypokalemia: KLOR was discontinued today. Hypomagnesemia: She is on daily magnesium oxide supplementation. Last magnesium on 05/31/20 was 1.4.  Hyperlipidemia: Currently on atorvastatin 40 mg daily. Following a low fat diet: Yes. Side effects from medication: None. Aortic atherosclerosis seen on abdominal CT done on 11/17/26.  Lab Results  Component Value Date   CHOL 122 02/11/2020   HDL 63 02/11/2020   LDLCALC 36 02/11/2020   LDLDIRECT 120.0 08/26/2017   TRIG 145 02/11/2020   CHOLHDL 1.9 02/11/2020   Anxiety and insomnia:  Currently she is on Celexa 10 mg daily and lorazepam 0.5 mg daily. Lorazepam helps her sleep, sleeping about 7 hours.  She is tolerating medication well. Her daughter thinks medications are helping. Denies depressed mood.  Planning on undergoing laparoscopic procedure to remove cecal polyp on 01/09/2022. Abdominal CT 11/22/20 showed 5.4 cm benign-appearing cyst in central pelvis, suspicious  for ovarian cyst, with postop fluid collection considered less likely.  Review of Systems  Constitutional:  Negative for chills and fever.  Respiratory:  Negative for cough and wheezing.   Gastrointestinal:  Negative for abdominal pain, blood in stool, nausea and vomiting.       No changes in bowel habits.  Genitourinary:  Negative for decreased urine volume, dysuria and hematuria.  Musculoskeletal:  Positive for arthralgias and gait problem.  Neurological:  Negative for syncope and facial asymmetry.  Psychiatric/Behavioral:  Negative for confusion and hallucinations.   Rest of ROS see pertinent positives and negatives in HPI.  Current Outpatient Medications on File Prior to Visit  Medication Sig Dispense Refill   apixaban (ELIQUIS) 5 MG TABS tablet Take 1 tablet (5 mg total) by mouth 2 (two) times daily. 60 tablet 6   atorvastatin (LIPITOR) 40 MG tablet TAKE 1 TABLET EACH DAY. (Patient taking differently: Take 40 mg by mouth daily.) 90 tablet 3   citalopram (CELEXA) 10 MG tablet Take 1 tablet (10 mg total) by mouth daily. 90 tablet 3   Cyanocobalamin (VITAMIN B 12 PO) Take 500 mcg by mouth daily.     Ferrous Gluconate 324 (37.5 Fe) MG TABS Take 1 tablet by mouth every other day.     furosemide (LASIX) 80 MG tablet Take 1 tablet (80 mg total) by mouth daily. 90 tablet 3   LORazepam (ATIVAN) 0.5 MG tablet TAKE 1/2 TABLET AT BEDTIME AS NEEDED FOR ANXIETY. 30 tablet 3   losartan (COZAAR) 50 MG tablet Take 1 tablet (50 mg total) by mouth daily. 90 tablet  2   magnesium oxide (MAG-OX) 400 (241.3 Mg) MG tablet Take 0.5 tablets (200 mg total) by mouth daily. (Patient taking differently: Take 400 mg by mouth daily.)     metoprolol succinate (TOPROL-XL) 25 MG 24 hr tablet Take 1 tablet (25 mg total) by mouth daily. 90 tablet 2   Multiple Vitamin (MULTIVITAMIN WITH MINERALS) TABS tablet Take 1 tablet by mouth daily.     pantoprazole (PROTONIX) 40 MG tablet Take 1 tablet (40 mg total) by mouth daily.  90 tablet 2   spironolactone (ALDACTONE) 25 MG tablet Take 1 tablet (25 mg total) by mouth daily. 90 tablet 3   No current facility-administered medications on file prior to visit.   Past Medical History:  Diagnosis Date   Anxiety    Arthritis    Chicken pox    COVID-19    Heart murmur    Hyperlipidemia    Hypertension    No Known Allergies  Social History   Socioeconomic History   Marital status: Widowed    Spouse name: Not on file   Number of children: 2   Years of education: Not on file   Highest education level: Not on file  Occupational History   Not on file  Tobacco Use   Smoking status: Never   Smokeless tobacco: Never  Substance and Sexual Activity   Alcohol use: Yes    Comment: Occasional   Drug use: No   Sexual activity: Not Currently  Other Topics Concern   Not on file  Social History Narrative   Not on file   Social Determinants of Health   Financial Resource Strain: Low Risk    Difficulty of Paying Living Expenses: Not hard at all  Food Insecurity: Not on file  Transportation Needs: No Transportation Needs   Lack of Transportation (Medical): No   Lack of Transportation (Non-Medical): No  Physical Activity: Not on file  Stress: Not on file  Social Connections: Not on file    Today's Vitals   01/05/21 1122  BP: (!) 146/78  Pulse: (!) 56  Resp: 16  Temp: (!) 97.3 F (36.3 C)  TempSrc: Oral  SpO2: 97%  Weight: 173 lb (78.5 kg)  Height: 5' (1.524 m)   Body mass index is 33.79 kg/m.  Physical Exam Vitals and nursing note reviewed.  Constitutional:      General: She is not in acute distress.    Appearance: She is well-developed.  HENT:     Head: Normocephalic and atraumatic.     Mouth/Throat:     Mouth: Mucous membranes are moist.     Pharynx: Oropharynx is clear.  Eyes:     Conjunctiva/sclera: Conjunctivae normal.  Cardiovascular:     Rate and Rhythm: Normal rate. Rhythm irregular.     Pulses:          Dorsalis pedis pulses are  2+ on the right side and 2+ on the left side.     Heart sounds: No murmur heard. Pulmonary:     Effort: Pulmonary effort is normal. No respiratory distress.     Breath sounds: Normal breath sounds.  Abdominal:     Palpations: Abdomen is soft. There is no hepatomegaly or mass.     Tenderness: There is no abdominal tenderness.  Musculoskeletal:     Right lower leg: 3+ Pitting Edema present.     Left lower leg: 3+ Pitting Edema present.  Lymphadenopathy:     Cervical: No cervical adenopathy.  Skin:    General:  Skin is warm.     Findings: No erythema or rash.  Neurological:     General: No focal deficit present.     Mental Status: She is alert and oriented to person, place, and time.     Comments: Unstable gait assisted with a walker.  Psychiatric:     Comments: Well groomed, good eye contact.   ASSESSMENT AND PLAN:  Ms.Aubrina was seen today for follow-up.  Diagnoses and all orders for this visit: Orders Placed This Encounter  Procedures   Flu Vaccine QUAD High Dose(Fluad)   Pneumococcal conjugate vaccine 20-valent (Prevnar 20)   Lipid panel   Magnesium   Hypomagnesemia Continue Mg Oxide 400 mg daily. Mg added to next blood work, further recommendations will be given according to lab results.  Insomnia, unspecified type Stable. Continue good sleep hygiene and Lorazepam 0.5 mg at bedtime prn.  Hyperlipidemia, mixed I think LDL goal < 100 is appropriate. Continue Atorvastatin 40 mg daily and low fat diet. FLP with next blood work.  Bilateral lower extremity edema Spironolactone 25 mg was added today and furosemide dose increased by her cardiologist. LE elevation and compression stocking will help. Appropriate skin care also discussed.  Hypertension, essential, benign Overall adequate controlled for her age. Today treatment was adjusted: Started on Spironolactone. Continue Metoprolol succinate and Losartan same dose. Low salt diet.  Anxiety disorder, unspecified  type Stable. Tolerating medication well. Continue Celexa and Lorazepam same dose.  Atherosclerosis of aorta ([place]) We discussed Dx and treatment recommendations. She is on Atorvastatin and chronic anticoagulation.  Cyst of ovary, unspecified laterality Discussed abdominal CT findings, cyst seems benign. She does not want further follow ups.  Need for influenza vaccination -     Flu Vaccine QUAD High Dose(Fluad)  Need for pneumococcal vaccination -     Pneumococcal conjugate vaccine 20-valent (Prevnar 20)  I spent a total of 42 minutes in both face to face and non face to face activities for this visit on the date of this encounter. During this time history was obtained and documented, examination was performed, prior labs/imaging reviewed, and assessment/plan discussed.  Return in about 6 months (around 07/05/2021).  Arrian Manson [name], MD [facility]. [place] [facility].

## 2021-01-05 ENCOUNTER — Encounter: Payer: Self-pay | Admitting: Family Medicine

## 2021-01-05 ENCOUNTER — Ambulatory Visit (HOSPITAL_BASED_OUTPATIENT_CLINIC_OR_DEPARTMENT_OTHER): Payer: Medicare Other | Admitting: Cardiovascular Disease

## 2021-01-05 ENCOUNTER — Other Ambulatory Visit: Payer: Self-pay

## 2021-01-05 ENCOUNTER — Encounter (HOSPITAL_BASED_OUTPATIENT_CLINIC_OR_DEPARTMENT_OTHER): Payer: Self-pay | Admitting: Cardiovascular Disease

## 2021-01-05 ENCOUNTER — Ambulatory Visit (INDEPENDENT_AMBULATORY_CARE_PROVIDER_SITE_OTHER): Payer: Medicare Other | Admitting: Family Medicine

## 2021-01-05 VITALS — BP 144/72 | HR 70 | Ht 60.0 in | Wt 173.0 lb

## 2021-01-05 DIAGNOSIS — Z23 Encounter for immunization: Secondary | ICD-10-CM | POA: Diagnosis not present

## 2021-01-05 DIAGNOSIS — E782 Mixed hyperlipidemia: Secondary | ICD-10-CM | POA: Diagnosis not present

## 2021-01-05 DIAGNOSIS — G4700 Insomnia, unspecified: Secondary | ICD-10-CM | POA: Diagnosis not present

## 2021-01-05 DIAGNOSIS — N83209 Unspecified ovarian cyst, unspecified side: Secondary | ICD-10-CM

## 2021-01-05 DIAGNOSIS — R600 Localized edema: Secondary | ICD-10-CM | POA: Diagnosis not present

## 2021-01-05 DIAGNOSIS — Z5181 Encounter for therapeutic drug level monitoring: Secondary | ICD-10-CM

## 2021-01-05 DIAGNOSIS — I4819 Other persistent atrial fibrillation: Secondary | ICD-10-CM

## 2021-01-05 DIAGNOSIS — I10 Essential (primary) hypertension: Secondary | ICD-10-CM | POA: Diagnosis not present

## 2021-01-05 DIAGNOSIS — F419 Anxiety disorder, unspecified: Secondary | ICD-10-CM

## 2021-01-05 DIAGNOSIS — I700 Atherosclerosis of aorta: Secondary | ICD-10-CM

## 2021-01-05 MED ORDER — SPIRONOLACTONE 25 MG PO TABS: 25.0000 mg | ORAL_TABLET | Freq: Every day | ORAL | 3 refills | Status: DC

## 2021-01-05 MED ORDER — FUROSEMIDE 80 MG PO TABS: 80.0000 mg | ORAL_TABLET | Freq: Every day | ORAL | 3 refills | Status: DC

## 2021-01-05 NOTE — Assessment & Plan Note (Signed)
Continue atorvastatin

## 2021-01-05 NOTE — Patient Instructions (Addendum)
A few things to remember from today's visit:   Hypertension, essential, benign  Insomnia, unspecified type  Anxiety disorder, unspecified type  Hyperlipidemia, mixed - Plan: Lipid panel  Hypomagnesemia - Plan: Magnesium  Bilateral lower extremity edema  Atherosclerosis of aorta (HCC)  Cyst of ovary, unspecified laterality  If you need refills please call your pharmacy. Do not use My Chart to request refills or for acute issues that need immediate attention.   No changes in medications. I am adding cholesterol and magnesium to your next blood work. Monitor blood pressure at home. Lower extremity elevation or compression stocking to also help with leg swelling.  Please be sure medication list is accurate. If a new problem present, please set up appointment sooner than planned today.

## 2021-01-05 NOTE — Patient Instructions (Addendum)
Medication Instructions:  STOP AMLODIPINE   STOP POTASSIUM   START SPIRONOLACTONE 25 MG DAILY   INCREASE FUROSEMIDE TO 80 MG DAILY   *If you need a refill on your cardiac medications before your next appointment, please call your pharmacy*  Lab Work: BMET IN 1 WEEK   If you have labs (blood work) drawn today and your tests are completely normal, you will receive your results only by: [place] (if you have MyChart) OR A paper copy in the mail If you have any lab test that is abnormal or we need to change your treatment, we will call you to review the results.  Testing/Procedures: NONE  Follow-Up: At [facility], you and your health needs are our priority.  As part of our continuing mission to provide you with exceptional heart care, we have created designated Provider Care Teams.  These Care Teams include your primary Cardiologist (physician) and Advanced Practice Providers (APPs -  Physician Assistants and Nurse Practitioners) who all work together to provide you with the care you need, when you need it.  We recommend signing up for the patient portal called "MyChart".  Sign up information is provided on this After Visit Summary.  MyChart is used to connect with patients for Virtual Visits (Telemedicine).  Patients are able to view lab/test results, encounter notes, upcoming appointments, etc.  Non-urgent messages can be sent to your provider as well.   To learn more about what you can do with MyChart, go to [email or web address].    Your next appointment:   2-3 month(s)  The format for your next appointment:   In Person  Provider:   [name], MD

## 2021-01-05 NOTE — Progress Notes (Signed)
Cardiology Office Note   Date:  01/05/2021   ID:  Linda Glass, DOB May 22, 1933, MRN [id number]  PCP:  Linda Glass, Linda G, MD  Cardiologist:   [name], MD   No chief complaint on file.    History of Present Illness: Linda Glass is a 87 y.o. female with paroxysmal atrial fibrillation, hypertension, chronic diastolic heart failure, who presents for follow up.  She was initially seen in the hospital 04/2020.  She was admitted with new onset atrial fibrillation in the setting of GI bleed.  Her hemoglobin was 5.4 at the time.  She also had acute diastolic heart failure.  She was diuresed with IV Lasix and was negative about 7 L prior to discharge.  She underwent upper and lower endoscopies, and was found to have a gastric ulcer as well as a tubular adenoma at the ileocecal valve.  Biopsy was consistent with focal anterior intestinal metaplasia and negative for H. pylori.  There is no malignancy.  She required 4 units of packed red blood cells.  Atrial fibrillation was rate controlled with metoprolol.  Eliquis was restarted after discharge.  She followed up with Linda Glass on 05/2020 and was doing well.  She had no recurrent bleeding at that time.  They discussed rate and rhythm control and she elected not to undergo any further procedures or additional medications at that time.  Since that time she has been well.  She has no chest pain or shortness of breath.  She denies any palpitations, lightheadedness, or dizziness.  At home her blood pressures been in the 140s over 60s to 80s.  She is very active but does not get any formal exercise.  Her only complaint is that she is noted a lot of lower extremity edema at her ankles.  She did not have this prior to going to the hospital.  She is scheduled to have surgery on her adenoma in January.   Past Medical History:  Diagnosis Date   Anxiety    Arthritis    Chicken pox    COVID-19    Heart murmur     Hyperlipidemia    Hypertension     Past Surgical History:  Procedure Laterality Date   ABDOMINAL HYSTERECTOMY  1983   BIOPSY  05/05/2020   Procedure: BIOPSY;  Surgeon: Mauri Pole, MD;  Location: [facility];  Service: Endoscopy;;  EGD and COLON   COLONOSCOPY WITH PROPOFOL N/A 05/05/2020   Procedure: COLONOSCOPY WITH PROPOFOL;  Surgeon: Mauri Pole, MD;  Location: [facility];  Service: Endoscopy;  Laterality: N/A;   ESOPHAGOGASTRODUODENOSCOPY (EGD) WITH PROPOFOL N/A 05/05/2020   Procedure: ESOPHAGOGASTRODUODENOSCOPY (EGD) WITH PROPOFOL;  Surgeon: Mauri Pole, MD;  Location: [facility];  Service: Endoscopy;  Laterality: N/A;   FRACTURE SURGERY     wrist fracture 2013     Current Outpatient Medications  Medication Sig Dispense Refill   apixaban (ELIQUIS) 5 MG TABS tablet Take 1 tablet (5 mg total) by mouth 2 (two) times daily. 60 tablet 6   atorvastatin (LIPITOR) 40 MG tablet TAKE 1 TABLET EACH DAY. (Patient taking differently: Take 40 mg by mouth daily.) 90 tablet 3   citalopram (CELEXA) 10 MG tablet Take 1 tablet (10 mg total) by mouth daily. 90 tablet 3   Cyanocobalamin (VITAMIN B 12 PO) Take 500 mcg by mouth daily.     Ferrous Gluconate 324 (37.5 Fe) MG TABS Take 1 tablet by mouth every other day.     LORazepam (  ATIVAN) 0.5 MG tablet TAKE 1/2 TABLET AT BEDTIME AS NEEDED FOR ANXIETY. 30 tablet 3   losartan (COZAAR) 50 MG tablet Take 1 tablet (50 mg total) by mouth daily. 90 tablet 2   magnesium oxide (MAG-OX) 400 (241.3 Mg) MG tablet Take 0.5 tablets (200 mg total) by mouth daily. (Patient taking differently: Take 400 mg by mouth daily.)     metoprolol succinate (TOPROL-XL) 25 MG 24 hr tablet Take 1 tablet (25 mg total) by mouth daily. 90 tablet 2   Multiple Vitamin (MULTIVITAMIN WITH MINERALS) TABS tablet Take 1 tablet by mouth daily.     pantoprazole (PROTONIX) 40 MG tablet Take 1 tablet (40 mg total) by mouth daily. 90 tablet 2   spironolactone (ALDACTONE)  25 MG tablet Take 1 tablet (25 mg total) by mouth daily. 90 tablet 3   furosemide (LASIX) 80 MG tablet Take 1 tablet (80 mg total) by mouth daily. 90 tablet 3   No current facility-administered medications for this visit.    Allergies:   Patient has no known allergies.    Social History:  The patient  reports that she has never smoked. She has never used smokeless tobacco. She reports current alcohol use. She reports that she does not use drugs.   Family History:  The patient's family history includes Hyperlipidemia in her mother.    ROS:  Please see the history of present illness.   Otherwise, review of systems are positive for none.   All other systems are reviewed and negative.    PHYSICAL EXAM: VS:  BP (!) 144/72   Pulse 70   Ht 5' (1.524 m)   Wt 173 lb (78.5 kg)   BMI 33.79 kg/m  , BMI Body mass index is 33.79 kg/m. GENERAL:  Well appearing HEENT:  Pupils equal round and reactive, fundi not visualized, oral mucosa unremarkable NECK:  No jugular venous distention, waveform within normal limits, carotid upstroke brisk and symmetric, no bruits, no thyromegaly LUNGS:  Clear to auscultation bilaterally HEART:  RRR.  PMI not displaced or sustained,S1 and S2 within normal limits, no S3, no S4, no clicks, no rubs, no murmurs ABD:  Flat, positive bowel sounds normal in frequency in pitch, no bruits, no rebound, no guarding, no midline pulsatile mass, no hepatomegaly, no splenomegaly EXT:  2 plus pulses throughout, no edema, no cyanosis no clubbing SKIN:  No rashes no nodules NEURO:  Cranial nerves II through XII grossly intact, motor grossly intact throughout PSYCH:  Cognitively intact, oriented to person place and time   EKG:  EKG is ordered today. The ekg ordered today demonstrates atrial fibrillation.  Rate 70 bpm.  Incomplete right bundle branch block.  Cannot rule out prior septal infarct.  Echo 05/02/2020: 1. Left ventricular ejection fraction, by estimation, is 60 to 65%.  The  left ventricle has normal function. The left ventricle has no regional  wall motion abnormalities. There is moderate left ventricular hypertrophy.  Left ventricular diastolic function   could not be evaluated.   2. Right ventricular systolic function is normal. The right ventricular  size is normal. There is moderately elevated pulmonary artery systolic  pressure. The estimated right ventricular systolic pressure is 94.7 mmHg.   3. Left atrial size was mildly dilated.   4. Right atrial size was mildly dilated.   5. The mitral valve is normal in structure. Mild mitral valve  regurgitation.   6. The aortic valve is calcified. There is mild calcification of the  aortic valve. There  is mild thickening of the aortic valve. Aortic valve  regurgitation is not visualized. No aortic stenosis is present.   7. The inferior vena cava is dilated in size with <50% respiratory  variability, suggesting right atrial pressure of 15 mmHg.   Recent Labs: 05/01/2020: Pro B Natriuretic peptide (BNP) 599.0; TSH 2.89 05/02/2020: B Natriuretic Peptide 596.4 05/31/2020: Magnesium 1.4 11/14/2020: ALT 20; BUN 8; Creatinine, Ser 0.66; Hemoglobin 12.6; Platelets 157.0; Potassium 4.2; Sodium 137    Lipid Panel    Component Value Date/Time   CHOL 122 02/11/2020 1103   TRIG 145 02/11/2020 1103   HDL 63 02/11/2020 1103   CHOLHDL 1.9 02/11/2020 1103   VLDL 72.8 (H) 08/21/2016 1014   LDLCALC 36 02/11/2020 1103   LDLDIRECT 120.0 08/26/2017 1005      Wt Readings from Last 3 Encounters:  01/05/21 173 lb (78.5 kg)  11/14/20 168 lb 8 oz (76.4 kg)  06/14/20 164 lb (74.4 kg)      ASSESSMENT AND PLAN:  Hypertension, essential, benign Blood pressure is above goal.  She is having edema which may be at least partially due to the amlodipine.  We will stop the amlodipine and switch to spironolactone 25 mg daily.  Therefore we will also stop her potassium.  Continue losartan.  I have asked her to track her blood  pressures and we can increase this or switch to an alternative ARB if needed at follow-up.  Continue metoprolol.  Persistent atrial fibrillation ([place]) She remains in atrial fibrillation but is asymptomatic.  Rates are well-controlled.  Continue metoprolol and Eliquis.  Okay to hold Eliquis prior to her surgery in January.  Hyperlipidemia, mixed Continue atorvastatin.   Current medicines are reviewed at length with the patient today.  The patient does not have concerns regarding medicines.  The following changes have been made:  stop amlodipine and potassium.  Increase furosemide and add spironolactone.  Labs/ tests ordered today include:   Orders Placed This Encounter  Procedures   Basic metabolic panel   EKG 23-RAQT     Disposition:   FU with Linda Ezelle C. Oval Linsey, MD, [facility] in 2 months.      Signed, Linda Berman C. Oval Linsey, MD, [facility]  01/05/2021 11:19 AM    [place]

## 2021-01-05 NOTE — Assessment & Plan Note (Signed)
She remains in atrial fibrillation but is asymptomatic.  Rates are well-controlled.  Continue metoprolol and Eliquis.  Okay to hold Eliquis prior to her surgery in January.

## 2021-01-05 NOTE — Assessment & Plan Note (Signed)
Blood pressure is above goal.  She is having edema which may be at least partially due to the amlodipine.  We will stop the amlodipine and switch to spironolactone 25 mg daily.  Therefore we will also stop her potassium.  Continue losartan.  I have asked her to track her blood pressures and we can increase this or switch to an alternative ARB if needed at follow-up.  Continue metoprolol.

## 2021-01-16 LAB — BASIC METABOLIC PANEL
BUN/Creatinine Ratio: 22 (ref 12–28)
BUN: 13 mg/dL (ref 8–27)
CO2: 24 mmol/L (ref 20–29)
Calcium: 10 mg/dL (ref 8.7–10.3)
Chloride: 101 mmol/L (ref 96–106)
Creatinine, Ser: 0.58 mg/dL (ref 0.57–1.00)
Glucose: 100 mg/dL — ABNORMAL HIGH (ref 70–99)
Potassium: 5.1 mmol/L (ref 3.5–5.2)
Sodium: 141 mmol/L (ref 134–144)
eGFR: 89 mL/min/{1.73_m2} (ref >59)

## 2021-02-21 ENCOUNTER — Other Ambulatory Visit: Payer: Self-pay | Admitting: Family Medicine

## 2021-02-21 DIAGNOSIS — I4891 Unspecified atrial fibrillation: Secondary | ICD-10-CM

## 2021-02-21 NOTE — Progress Notes (Signed)
Surgery orders requested with Dr. [name]'s office. 

## 2021-02-22 ENCOUNTER — Ambulatory Visit: Payer: Self-pay | Admitting: Surgery

## 2021-02-22 ENCOUNTER — Other Ambulatory Visit (HOSPITAL_COMMUNITY): Payer: Self-pay

## 2021-02-22 DIAGNOSIS — Z01818 Encounter for other preprocedural examination: Secondary | ICD-10-CM

## 2021-02-23 NOTE — Patient Instructions (Addendum)
DUE TO COVID-19 ONLY ONE VISITOR IS ALLOWED TO COME WITH YOU AND STAY IN THE WAITING ROOM ONLY DURING PRE OP AND PROCEDURE DAY OF SURGERY IF YOU ARE GOING HOME AFTER SURGERY. IF YOU ARE SPENDING THE NIGHT 2 PEOPLE MAY VISIT WITH YOU IN YOUR PRIVATE ROOM AFTER SURGERY UNTIL VISITING  HOURS ARE OVER AT 800 PM AND 1  VISITOR  MAY  SPEND THE NIGHT.   YOU NEED TO HAVE A COVID 19 TEST ON_1/12/23______ @_9 :45______, THIS TEST MUST BE DONE BEFORE SURGERY,   Come through the main entrance of [facility] and sit in the chairs on the  rights side of door. Call [phone] to let them know you are here.               Linda Glass     Your procedure is scheduled on: 03/12/21   Report to [facility] Main  Entrance   Report to short stay at 5:15 AM   Call this number if you have problems the morning of surgery [phone]    Follow all diet and bowel prep instructions from the Dr's office.  Drink plenty of fluids on the day of prep to prevent dehydration    DRINK 2 PRESURGERY ENSURE DRINKS THE NIGHT BEFORE SURGERY AT 10:00 PM .   NO SOLIDS AFTER MIDNIGHT THE DAY PRIOR TO THE SURGERY.   NOTHING BY MOUTH EXCEPT CLEAR LIQUIDS UNTIL 4:30 am   PLEASE FINISH PRESURGERY ENSURE DRINK PER SURGEON ORDER 3 HOURS PRIOR TO SCHEDULED SURGERY TIME WHICH NEEDS TO BE COMPLETED AT __4:30 am_______.   CLEAR LIQUID DIET   Foods Allowed                                                                     Foods Excluded  Coffee and tea, regular and decaf                             liquids that you cannot  Plain Jell-O any favor except red or purple                                           see through such as: Fruit ices (not with fruit pulp)                                     milk, soups, orange juice  Iced Popsicles                                    All solid food Carbonated beverages, regular and diet                                    Cranberry, grape and apple  juices Sports drinks like Gatorade Lightly seasoned clear broth or consume(fat free) Sugar    BRUSH YOUR TEETH MORNING OF SURGERY  AND RINSE YOUR MOUTH OUT, NO CHEWING GUM CANDY OR MINTS.       Take these medicines the morning of surgery with A SIP OF WATER: Citalopram, Metoprolol,Pantoprazole  Stop taking _Eliquis__________on __________as instructed by ___Call Dr. Jordan__________.   Contact your Surgeon/Cardiologist for instructions on Anticoagulant Therapy prior to surgery.                                 You may not have any metal on your body including hair pins and              piercings  Do not wear jewelry, make-up, lotions, powders or perfumes, deodorant            Do not wear nail polish on your fingernails.  Do not shave  48 hours prior to surgery.                 Do not bring valuables to the hospital. [place].  Contacts, dentures or bridgework may not be worn into surgery.  Leave suitcase in the car. After surgery it may be brought to your room.              [facility] Preparing for [facility] Before surgery, you can play an important role.  Because skin is not sterile, your skin needs to be as free of germs as possible.  You can reduce the number of germs on your skin by washing with CHG (chlorahexidine gluconate) soap before surgery.  CHG is an antiseptic cleaner which kills germs and bonds with the skin to continue killing germs even after washing. Please DO NOT use if you have an allergy to CHG or antibacterial soaps.  If your skin becomes reddened/irritated stop using the CHG and inform your nurse when you arrive at Short Stay. Do not shave (including legs and underarms) for at least 48 hours prior to the first CHG shower.  Please follow these instructions carefully:  1.  Shower with CHG Soap the night before surgery and the  morning of Surgery.  2.  If you choose to wash your hair, wash your hair first as usual  with your  normal  shampoo.  3.  After you shampoo, rinse your hair and body thoroughly to remove the  shampoo.                            4.  Use CHG as you would any other liquid soap.  You can apply chg directly  to the skin and wash                       Gently with a scrungie or clean washcloth.  5.  Apply the CHG Soap to your body ONLY FROM THE NECK DOWN.   Do not use on face/ open                           Wound or open sores. Avoid contact with eyes, ears mouth and genitals (private parts).                       Wash face,  Genitals (private parts) with your normal soap.  6.  Wash thoroughly, paying special attention to the area where your surgery  will be performed.  7.  Thoroughly rinse your body with warm water from the neck down.  8.  DO NOT shower/wash with your normal soap after using and rinsing off  the CHG Soap.                9.  Pat yourself dry with a clean towel.            10.  Wear clean pajamas.            11.  Place clean sheets on your bed the night of your first shower and do not  sleep with pets. Day of Surgery : Do not apply any lotions/deodorants the morning of surgery.  Please wear clean clothes to the hospital/surgery center.  FAILURE TO FOLLOW THESE INSTRUCTIONS MAY RESULT IN THE CANCELLATION OF YOUR SURGERY PATIENT SIGNATURE_________________________________  NURSE SIGNATURE__________________________________  ________________________________________________________________________   Linda Glass  An incentive spirometer is a tool that can help keep your lungs clear and active. This tool measures how well you are filling your lungs with each breath. Taking long deep breaths may help reverse or decrease the chance of developing breathing (pulmonary) problems (especially infection) following: A long period of time when you are unable to move or be active. BEFORE THE PROCEDURE  If the spirometer includes an indicator to show your best  effort, your nurse or respiratory therapist will set it to a desired goal. If possible, sit up straight or lean slightly forward. Try not to slouch. Hold the incentive spirometer in an upright position. INSTRUCTIONS FOR USE  Sit on the edge of your bed if possible, or sit up as far as you can in bed or on a chair. Hold the incentive spirometer in an upright position. Breathe out normally. Place the mouthpiece in your mouth and seal your lips tightly around it. Breathe in slowly and as deeply as possible, raising the piston or the ball toward the top of the column. Hold your breath for 3-5 seconds or for as long as possible. Allow the piston or ball to fall to the bottom of the column. Remove the mouthpiece from your mouth and breathe out normally. Rest for a few seconds and repeat Steps 1 through 7 at least 10 times every 1-2 hours when you are awake. Take your time and take a few normal breaths between deep breaths. The spirometer may include an indicator to show your best effort. Use the indicator as a goal to work toward during each repetition. After each set of 10 deep breaths, practice coughing to be sure your lungs are clear. If you have an incision (the cut made at the time of surgery), support your incision when coughing by placing a pillow or rolled up towels firmly against it. Once you are able to get out of bed, walk around indoors and cough well. You may stop using the incentive spirometer when instructed by your caregiver.  RISKS AND COMPLICATIONS Take your time so you do not get dizzy or light-headed. If you are in pain, you may need to take or ask for pain medication before doing incentive spirometry. It is harder to take a deep breath if you are having pain. AFTER USE Rest and breathe slowly and easily. It can be helpful to keep track of a log of your progress. Your caregiver can provide you with a simple table to help with this. If you are using  the spirometer at home, follow  these instructions: [place] IF:  You are having difficultly using the spirometer. You have trouble using the spirometer as often as instructed. Your pain medication is not giving enough relief while using the spirometer. You develop fever of 100.5 F (38.1 C) or higher. SEEK IMMEDIATE MEDICAL CARE IF:  You cough up bloody sputum that had not been present before. You develop fever of 102 F (38.9 C) or greater. You develop worsening pain at or near the incision site. MAKE SURE YOU:  Understand these instructions. Will watch your condition. Will get help right away if you are not doing well or get worse. Document Released: 06/24/2006 Document Revised: 05/06/2011 Document Reviewed: 08/25/2006 [facility] Patient Information 2014 [place], [place].   ________________________________________________________________________

## 2021-02-27 ENCOUNTER — Encounter (HOSPITAL_COMMUNITY): Payer: Self-pay

## 2021-02-27 ENCOUNTER — Other Ambulatory Visit: Payer: Self-pay

## 2021-02-27 ENCOUNTER — Telehealth: Payer: Self-pay | Admitting: Family Medicine

## 2021-02-27 ENCOUNTER — Encounter (HOSPITAL_COMMUNITY)
Admission: RE | Admit: 2021-02-27 | Discharge: 2021-02-27 | Disposition: A | Discharge status: Home / Self Care | Payer: Medicare Other | Source: Ambulatory Visit | Attending: Surgery | Admitting: Surgery

## 2021-02-27 VITALS — BP 160/88 | HR 96 | Temp 97.0°F | Resp 20 | Ht 61.0 in | Wt 157.0 lb

## 2021-02-27 DIAGNOSIS — Z01812 Encounter for preprocedural laboratory examination: Secondary | ICD-10-CM | POA: Diagnosis not present

## 2021-02-27 DIAGNOSIS — Z8616 Personal history of COVID-19: Secondary | ICD-10-CM | POA: Insufficient documentation

## 2021-02-27 DIAGNOSIS — K635 Polyp of colon: Secondary | ICD-10-CM | POA: Diagnosis not present

## 2021-02-27 DIAGNOSIS — Z79899 Other long term (current) drug therapy: Secondary | ICD-10-CM | POA: Insufficient documentation

## 2021-02-27 DIAGNOSIS — I110 Hypertensive heart disease with heart failure: Secondary | ICD-10-CM | POA: Diagnosis not present

## 2021-02-27 DIAGNOSIS — Z7901 Long term (current) use of anticoagulants: Secondary | ICD-10-CM | POA: Insufficient documentation

## 2021-02-27 DIAGNOSIS — I5032 Chronic diastolic (congestive) heart failure: Secondary | ICD-10-CM | POA: Insufficient documentation

## 2021-02-27 DIAGNOSIS — I10 Essential (primary) hypertension: Secondary | ICD-10-CM

## 2021-02-27 DIAGNOSIS — I4891 Unspecified atrial fibrillation: Secondary | ICD-10-CM | POA: Diagnosis not present

## 2021-02-27 DIAGNOSIS — Z01818 Encounter for other preprocedural examination: Secondary | ICD-10-CM

## 2021-02-27 LAB — BASIC METABOLIC PANEL
Anion gap: 10 (ref 5–15)
BUN: 21 mg/dL (ref 8–23)
CO2: 25 mmol/L (ref 22–32)
Calcium: 10 mg/dL (ref 8.9–10.3)
Chloride: 103 mmol/L (ref 98–111)
Creatinine, Ser: 0.84 mg/dL (ref 0.44–1.00)
GFR, Estimated: >60 mL/min (ref >60)
Glucose, Bld: 112 mg/dL — ABNORMAL HIGH (ref 70–99)
Potassium: 5.1 mmol/L (ref 3.5–5.1)
Sodium: 138 mmol/L (ref 135–145)

## 2021-02-27 LAB — CBC
HCT: 42.3 % (ref 36.0–46.0)
Hemoglobin: 13.9 g/dL (ref 12.0–15.0)
MCH: 33.5 pg (ref 26.0–34.0)
MCHC: 32.9 g/dL (ref 30.0–36.0)
MCV: 101.9 fL — ABNORMAL HIGH (ref 80.0–100.0)
Platelets: 166 10*3/uL (ref 150–400)
RBC: 4.15 MIL/uL (ref 3.87–5.11)
RDW: 13 % (ref 11.5–15.5)
WBC: 7.9 10*3/uL (ref 4.0–10.5)
nRBC: 0 % (ref 0.0–0.2)

## 2021-02-27 LAB — HEMOGLOBIN A1C
Hgb A1c MFr Bld: 5.9 % — ABNORMAL HIGH (ref 4.8–5.6)
Mean Plasma Glucose: 122.63 mg/dL

## 2021-02-27 NOTE — Telephone Encounter (Signed)
Pt had pre-op and would like to know when should she discontinue the eliquis. Pt is has poly in her colon and they will removal on 03-12-2020 by dr stechschulte

## 2021-02-27 NOTE — Progress Notes (Signed)
COVID test- 03/08/21 at 9:45   PCP - Dr. B. [name] Cardiologist - Dr. Berneice Gandy  Chest x-ray - 05/02/20-epic EKG - 01/05/21-epic Stress Test - no ECHO - 05/02/20-epic Cardiac Cath - no Pacemaker/ICD device last checked:NA  Sleep Study - no CPAP -   Fasting Blood Sugar - NA Checks Blood Sugar _____ times a day  Blood Thinner Instructions:Eliquis/ Dr. [name] Aspirin Instructions:none Pt 's Daughter will call and ask Last Dose:  Anesthesia review: yes  Patient denies shortness of breath, fever, cough and chest pain at PAT appointment Pt uses a walker for balance and reports that she has no SOB with activities.  Patient verbalized understanding of instructions that were given to them at the PAT appointment. Patient was also instructed that they will need to review over the PAT instructions again at home before surgery. Yes

## 2021-02-27 NOTE — Telephone Encounter (Signed)
Most likely recommendations will be given by surgeon.  For procedures with high risk of bleeding, it is recommended to hold on Eliquis for 48 hours (2 days ) before procedure and resumed around 2-3 days after procedure, if there is not active bleeding. BJ

## 2021-02-28 LAB — CEA: CEA: 5.5 ng/mL — ABNORMAL HIGH (ref 0.0–4.7)

## 2021-02-28 NOTE — Progress Notes (Signed)
Anesthesia Chart Review   Case: [id number] Date/Time: 03/12/21 0715   Procedure: LAPAROSCOPIC HAND ASSISTED RIGHT COLECTOMY (Right)   Anesthesia type: General   Pre-op diagnosis: COLON POLYP   Location: [facility] ROOM 04 / [facility]   Surgeons: Felicie Morn, MD       DISCUSSION:88 y.o. never smoker with h/o HTN, atrial fibrillation, chronic diastolic heart failure, colon polyp scheduled for above procedure 03/12/2021 with Dr. Louanna Raw.   Pt last seen by cardiology 01/05/2021. Pt experienced elevated BP, lower extremity edema. Amlodipine stopped, spironolactone 25 mg started.  A-fib rate controlled, pt asymptomatic.   Advised to hold Eliquis 2 days prior to surgery.   Anticipate pt can proceed with planned procedure barring acute status change.   VS: BP (!) 160/88    Pulse 96    Temp (!) 36.1 C (Oral)    Resp 20    Ht 5\' 1"  (1.549 m)    Wt 71.2 kg    SpO2 97%    BMI 29.66 kg/m   PROVIDERS: [name], [name], MD is PCP   [name], MD is Cardiologist  LABS: Labs reviewed: Acceptable for surgery. (all labs ordered are listed, but only abnormal results are displayed)  Labs Reviewed  CBC - Abnormal; Notable for the following components:      Result Value   MCV 101.9 (*)    All other components within normal limits  BASIC METABOLIC PANEL - Abnormal; Notable for the following components:   Glucose, Bld 112 (*)    All other components within normal limits  HEMOGLOBIN A1C - Abnormal; Notable for the following components:   Hgb A1c MFr Bld 5.9 (*)    All other components within normal limits  CEA - Abnormal; Notable for the following components:   CEA 5.5 (*)    All other components within normal limits     IMAGES:   EKG: 01/05/2021 Rate 70 bpm  Atrial fibrillation  Incomplete right bundle branch block  Septal infarct, age undetermined  CV: Echo 05/02/2020 1. Left ventricular ejection fraction, by estimation, is 60 to 65%. The  left ventricle has normal  function. The left ventricle has no regional  wall motion abnormalities. There is moderate left ventricular hypertrophy.  Left ventricular diastolic function   could not be evaluated.   2. Right ventricular systolic function is normal. The right ventricular  size is normal. There is moderately elevated pulmonary artery systolic  pressure. The estimated right ventricular systolic pressure is 35.3 mmHg.   3. Left atrial size was mildly dilated.   4. Right atrial size was mildly dilated.   5. The mitral valve is normal in structure. Mild mitral valve  regurgitation.   6. The aortic valve is calcified. There is mild calcification of the  aortic valve. There is mild thickening of the aortic valve. Aortic valve  regurgitation is not visualized. No aortic stenosis is present.   7. The inferior vena cava is dilated in size with <50% respiratory  variability, suggesting right atrial pressure of 15 mmHg.  Past Medical History:  Diagnosis Date   Anemia 04/2020   Anxiety    Arthritis    Chicken pox    COVID-19    Heart murmur    Hyperlipidemia    Hypertension     Past Surgical History:  Procedure Laterality Date   ABDOMINAL HYSTERECTOMY  1983   BIOPSY  05/05/2020   Procedure: BIOPSY;  Surgeon: Mauri Pole, MD;  Location: [facility];  Service:  Endoscopy;;  EGD and COLON   COLONOSCOPY WITH PROPOFOL N/A 05/05/2020   Procedure: COLONOSCOPY WITH PROPOFOL;  Surgeon: Mauri Pole, MD;  Location[facility];  Service: Endoscopy;  Laterality: N/A;   ESOPHAGOGASTRODUODENOSCOPY (EGD) WITH PROPOFOL N/A 05/05/2020   Procedure: ESOPHAGOGASTRODUODENOSCOPY (EGD) WITH PROPOFOL;  Surgeon: Mauri Pole, MD;  [facility];  Service: Endoscopy;  Laterality: N/A;   FRACTURE SURGERY     wrist fracture 2013    MEDICATIONS:  acetaminophen (TYLENOL) 500 MG tablet   atorvastatin (LIPITOR) 40 MG tablet   citalopram (CELEXA) 10 MG tablet   Cyanocobalamin (VITAMIN B 12 PO)    ELIQUIS 5 MG TABS tablet   Ferrous Gluconate 324 (37.5 Fe) MG TABS   furosemide (LASIX) 80 MG tablet   LORazepam (ATIVAN) 0.5 MG tablet   losartan (COZAAR) 50 MG tablet   magnesium oxide (MAG-OX) 400 (241.3 Mg) MG tablet   metoprolol succinate (TOPROL-XL) 25 MG 24 hr tablet   Multiple Vitamin (MULTIVITAMIN WITH MINERALS) TABS tablet   pantoprazole (PROTONIX) 40 MG tablet   spironolactone (ALDACTONE) 25 MG tablet   No current facility-administered medications for this encounter.   Konrad Felix Ward, PA-C [facility] Pre-Surgical Testing [phone]

## 2021-02-28 NOTE — Anesthesia Preprocedure Evaluation (Addendum)
Anesthesia Evaluation  Patient identified by MRN, date of birth, ID band Patient awake    Reviewed: Allergy & Precautions, NPO status , Patient's Chart, lab work & pertinent test results, reviewed documented beta blocker date and time   History of Anesthesia Complications Negative for: history of anesthetic complications  Airway Mallampati: III  TM Distance: >3 FB Neck ROM: Full    Dental  (+) Dental Advisory Given   Pulmonary neg pulmonary ROS,    Pulmonary exam normal        Cardiovascular hypertension, Pt. on medications and Pt. on home beta blockers pulmonary hypertensionNormal cardiovascular exam+ dysrhythmias Atrial Fibrillation    '22 TTE - EF 60 to 65%. Moderate left ventricular hypertrophy. Moderately elevated pulmonary artery systolic pressure. The estimated right ventricular systolic pressure is 21.1 mmHg. Left atrial size was mildly dilated. Right atrial size was mildly dilated. Mild MR.    Neuro/Psych PSYCHIATRIC DISORDERS Anxiety negative neurological ROS     GI/Hepatic Neg liver ROS, PUD,   Endo/Other   Obesity   Renal/GU negative Renal ROS     Musculoskeletal  (+) Arthritis ,   Abdominal   Peds  Hematology  On eliquis    Anesthesia Other Findings   Reproductive/Obstetrics                           Anesthesia Physical Anesthesia Plan  ASA: 3  Anesthesia Plan: General   Post-op Pain Management: Tylenol PO (pre-op)   Induction: Intravenous, Rapid sequence and Cricoid pressure planned  PONV Risk Score and Plan: 3 and Treatment may vary due to age or medical condition, Ondansetron, Propofol infusion and Aprepitant  Airway Management Planned: Oral ETT  Additional Equipment: None  Intra-op Plan:   Post-operative Plan: Extubation in OR  Informed Consent: I have reviewed the patients History and Physical, chart, labs and discussed the procedure including the risks,  benefits and alternatives for the proposed anesthesia with the patient or authorized representative who has indicated his/her understanding and acceptance.     Dental advisory given  Plan Discussed with: CRNA and Anesthesiologist  Anesthesia Plan Comments: (Nauseous in preop, will RSI )     Anesthesia Quick Evaluation

## 2021-02-28 NOTE — Telephone Encounter (Signed)
I called and spoke with patient. She is aware to hold medication 2 days prior to procedure & resume 2-3 days after.

## 2021-03-05 ENCOUNTER — Other Ambulatory Visit: Payer: Self-pay | Admitting: Family Medicine

## 2021-03-08 ENCOUNTER — Encounter (HOSPITAL_COMMUNITY)
Admission: RE | Admit: 2021-03-08 | Discharge: 2021-03-08 | Disposition: A | Discharge status: Home / Self Care | Payer: Medicare Other | Source: Ambulatory Visit | Attending: Surgery | Admitting: Surgery

## 2021-03-08 ENCOUNTER — Other Ambulatory Visit: Payer: Self-pay

## 2021-03-08 DIAGNOSIS — Z01812 Encounter for preprocedural laboratory examination: Secondary | ICD-10-CM | POA: Insufficient documentation

## 2021-03-08 DIAGNOSIS — Z20822 Contact with and (suspected) exposure to covid-19: Secondary | ICD-10-CM | POA: Insufficient documentation

## 2021-03-08 DIAGNOSIS — Z01818 Encounter for other preprocedural examination: Secondary | ICD-10-CM

## 2021-03-08 LAB — SARS CORONAVIRUS 2 (TAT 6-24 HRS): SARS Coronavirus 2: NEGATIVE

## 2021-03-12 ENCOUNTER — Encounter (HOSPITAL_COMMUNITY): Payer: Self-pay | Admitting: Surgery

## 2021-03-12 ENCOUNTER — Inpatient Hospital Stay (HOSPITAL_COMMUNITY)
Admission: RE | Admit: 2021-03-12 | Discharge: 2021-03-16 | DRG: 330 | Disposition: A | Discharge status: Home / Self Care | Payer: Medicare Other | Attending: Surgery | Admitting: Surgery

## 2021-03-12 ENCOUNTER — Inpatient Hospital Stay (HOSPITAL_COMMUNITY): Payer: Medicare Other | Admitting: Physician Assistant

## 2021-03-12 ENCOUNTER — Inpatient Hospital Stay (HOSPITAL_COMMUNITY): Payer: Medicare Other | Admitting: Anesthesiology

## 2021-03-12 ENCOUNTER — Encounter (HOSPITAL_COMMUNITY)
Admission: RE | Disposition: A | Discharge status: Home / Self Care | Payer: Self-pay | Source: Home / Self Care | Attending: Surgery

## 2021-03-12 DIAGNOSIS — Z8616 Personal history of COVID-19: Secondary | ICD-10-CM

## 2021-03-12 DIAGNOSIS — E875 Hyperkalemia: Secondary | ICD-10-CM | POA: Diagnosis present

## 2021-03-12 DIAGNOSIS — D62 Acute posthemorrhagic anemia: Secondary | ICD-10-CM | POA: Diagnosis not present

## 2021-03-12 DIAGNOSIS — E782 Mixed hyperlipidemia: Secondary | ICD-10-CM | POA: Diagnosis present

## 2021-03-12 DIAGNOSIS — D122 Benign neoplasm of ascending colon: Principal | ICD-10-CM | POA: Diagnosis present

## 2021-03-12 DIAGNOSIS — E860 Dehydration: Secondary | ICD-10-CM | POA: Diagnosis not present

## 2021-03-12 DIAGNOSIS — Z7901 Long term (current) use of anticoagulants: Secondary | ICD-10-CM

## 2021-03-12 DIAGNOSIS — I10 Essential (primary) hypertension: Secondary | ICD-10-CM | POA: Diagnosis present

## 2021-03-12 DIAGNOSIS — E871 Hypo-osmolality and hyponatremia: Secondary | ICD-10-CM | POA: Diagnosis not present

## 2021-03-12 DIAGNOSIS — N179 Acute kidney failure, unspecified: Secondary | ICD-10-CM | POA: Diagnosis not present

## 2021-03-12 DIAGNOSIS — F419 Anxiety disorder, unspecified: Secondary | ICD-10-CM | POA: Diagnosis present

## 2021-03-12 DIAGNOSIS — Z83438 Family history of other disorder of lipoprotein metabolism and other lipidemia: Secondary | ICD-10-CM

## 2021-03-12 DIAGNOSIS — Z79899 Other long term (current) drug therapy: Secondary | ICD-10-CM | POA: Diagnosis not present

## 2021-03-12 DIAGNOSIS — K6389 Other specified diseases of intestine: Secondary | ICD-10-CM | POA: Diagnosis present

## 2021-03-12 DIAGNOSIS — I4819 Other persistent atrial fibrillation: Secondary | ICD-10-CM | POA: Diagnosis present

## 2021-03-12 HISTORY — PX: LAPAROSCOPIC PARTIAL COLECTOMY: SHX5907

## 2021-03-12 LAB — CBC
HCT: 36 % (ref 36.0–46.0)
Hemoglobin: 11.7 g/dL — ABNORMAL LOW (ref 12.0–15.0)
MCH: 34.9 pg — ABNORMAL HIGH (ref 26.0–34.0)
MCHC: 32.5 g/dL (ref 30.0–36.0)
MCV: 107.5 fL — ABNORMAL HIGH (ref 80.0–100.0)
Platelets: 142 10*3/uL — ABNORMAL LOW (ref 150–400)
RBC: 3.35 MIL/uL — ABNORMAL LOW (ref 3.87–5.11)
RDW: 13.5 % (ref 11.5–15.5)
WBC: 9.6 10*3/uL (ref 4.0–10.5)
nRBC: 0 % (ref 0.0–0.2)

## 2021-03-12 LAB — CREATININE, SERUM
Creatinine, Ser: 1.13 mg/dL — ABNORMAL HIGH (ref 0.44–1.00)
GFR, Estimated: 48 mL/min — ABNORMAL LOW (ref >60)

## 2021-03-12 SURGERY — LAPAROSCOPIC PARTIAL COLECTOMY
Anesthesia: General | Site: Abdomen | Laterality: Right

## 2021-03-12 MED ORDER — METHOCARBAMOL 1000 MG/10ML IJ SOLN
500.0000 mg | Freq: Four times a day (QID) | INTRAVENOUS | Status: DC | PRN
  Filled 2021-03-12: qty 5

## 2021-03-12 MED ORDER — APREPITANT 40 MG PO CAPS
40.0000 mg | ORAL_CAPSULE | Freq: Once | ORAL | Status: AC
  Administered 2021-03-12: 40 mg via ORAL
  Filled 2021-03-12: qty 1

## 2021-03-12 MED ORDER — ONDANSETRON HCL 4 MG/2ML IJ SOLN
4.0000 mg | Freq: Four times a day (QID) | INTRAMUSCULAR | Status: DC | PRN
  Administered 2021-03-12: 4 mg via INTRAVENOUS
  Filled 2021-03-12: qty 2

## 2021-03-12 MED ORDER — SPIRONOLACTONE 12.5 MG HALF TABLET
12.5000 mg | ORAL_TABLET | Freq: Every day | ORAL | Status: DC
  Filled 2021-03-12 (×2): qty 1

## 2021-03-12 MED ORDER — SIMETHICONE 80 MG PO CHEW: 80.0000 mg | CHEWABLE_TABLET | Freq: Four times a day (QID) | ORAL | Status: DC | PRN

## 2021-03-12 MED ORDER — LACTATED RINGERS IV SOLN: INTRAVENOUS | Status: DC

## 2021-03-12 MED ORDER — SODIUM CHLORIDE 0.9 % IV SOLN: INTRAVENOUS | Status: DC

## 2021-03-12 MED ORDER — ENSURE SURGERY PO LIQD
237.0000 mL | Freq: Two times a day (BID) | ORAL | Status: DC
  Administered 2021-03-13 – 2021-03-14 (×3): 237 mL via ORAL

## 2021-03-12 MED ORDER — OXYCODONE HCL 5 MG PO TABS
5.0000 mg | ORAL_TABLET | ORAL | Status: DC | PRN
  Administered 2021-03-12 – 2021-03-13 (×3): 5 mg via ORAL
  Filled 2021-03-12 (×3): qty 1

## 2021-03-12 MED ORDER — PROPOFOL 10 MG/ML IV BOLUS
INTRAVENOUS | Status: AC
  Filled 2021-03-12: qty 20

## 2021-03-12 MED ORDER — ALVIMOPAN 12 MG PO CAPS
12.0000 mg | ORAL_CAPSULE | ORAL | Status: AC
  Administered 2021-03-12: 12 mg via ORAL
  Filled 2021-03-12: qty 1

## 2021-03-12 MED ORDER — CHLORHEXIDINE GLUCONATE CLOTH 2 % EX PADS: 6.0000 | MEDICATED_PAD | Freq: Once | CUTANEOUS | Status: DC

## 2021-03-12 MED ORDER — ENOXAPARIN SODIUM 40 MG/0.4ML IJ SOSY
40.0000 mg | PREFILLED_SYRINGE | INTRAMUSCULAR | Status: DC
  Administered 2021-03-13: 40 mg via SUBCUTANEOUS
  Filled 2021-03-12: qty 0.4

## 2021-03-12 MED ORDER — DEXAMETHASONE SODIUM PHOSPHATE 10 MG/ML IJ SOLN
INTRAMUSCULAR | Status: AC
  Filled 2021-03-12: qty 1

## 2021-03-12 MED ORDER — ONDANSETRON HCL 4 MG/2ML IJ SOLN
INTRAMUSCULAR | Status: DC | PRN
Start: 2021-03-12 — End: 2021-03-12
  Administered 2021-03-12: 4 mg via INTRAVENOUS

## 2021-03-12 MED ORDER — FUROSEMIDE 40 MG PO TABS: 40.0000 mg | ORAL_TABLET | Freq: Every day | ORAL | Status: DC

## 2021-03-12 MED ORDER — SODIUM CHLORIDE 0.9 % IV SOLN
2.0000 g | INTRAVENOUS | Status: AC
  Administered 2021-03-12: 2 g via INTRAVENOUS
  Filled 2021-03-12: qty 2

## 2021-03-12 MED ORDER — HYDROMORPHONE HCL 1 MG/ML IJ SOLN
0.5000 mg | INTRAMUSCULAR | Status: DC | PRN
  Administered 2021-03-12 – 2021-03-13 (×2): 0.5 mg via INTRAVENOUS
  Filled 2021-03-12 (×2): qty 0.5

## 2021-03-12 MED ORDER — CHLORHEXIDINE GLUCONATE 0.12 % MT SOLN
15.0000 mL | Freq: Once | OROMUCOSAL | Status: AC
  Administered 2021-03-12: 15 mL via OROMUCOSAL

## 2021-03-12 MED ORDER — PROCHLORPERAZINE EDISYLATE 10 MG/2ML IJ SOLN: 10.0000 mg | INTRAMUSCULAR | Status: DC | PRN

## 2021-03-12 MED ORDER — DOCUSATE SODIUM 100 MG PO CAPS
100.0000 mg | ORAL_CAPSULE | Freq: Two times a day (BID) | ORAL | Status: DC
  Administered 2021-03-16: 100 mg via ORAL
  Filled 2021-03-12 (×6): qty 1

## 2021-03-12 MED ORDER — ACETAMINOPHEN 325 MG PO TABS
650.0000 mg | ORAL_TABLET | Freq: Four times a day (QID) | ORAL | Status: DC
  Administered 2021-03-12 – 2021-03-16 (×11): 650 mg via ORAL
  Filled 2021-03-12 (×10): qty 2

## 2021-03-12 MED ORDER — BUPIVACAINE LIPOSOME 1.3 % IJ SUSP
INTRAMUSCULAR | Status: AC
  Filled 2021-03-12: qty 20

## 2021-03-12 MED ORDER — LACTATED RINGERS IR SOLN
Status: DC | PRN
Start: 2021-03-12 — End: 2021-03-12
  Administered 2021-03-12: 1000 mL

## 2021-03-12 MED ORDER — ACETAMINOPHEN 500 MG PO TABS
1000.0000 mg | ORAL_TABLET | ORAL | Status: AC
  Administered 2021-03-12: 1000 mg via ORAL
  Filled 2021-03-12: qty 2

## 2021-03-12 MED ORDER — ATORVASTATIN CALCIUM 40 MG PO TABS
40.0000 mg | ORAL_TABLET | Freq: Every day | ORAL | Status: DC
  Administered 2021-03-12 – 2021-03-16 (×5): 40 mg via ORAL
  Filled 2021-03-12 (×5): qty 1

## 2021-03-12 MED ORDER — OXYCODONE HCL 5 MG/5ML PO SOLN: 5.0000 mg | Freq: Once | ORAL | Status: DC | PRN

## 2021-03-12 MED ORDER — ROCURONIUM BROMIDE 10 MG/ML (PF) SYRINGE
PREFILLED_SYRINGE | INTRAVENOUS | Status: DC | PRN
  Administered 2021-03-12: 60 mg via INTRAVENOUS

## 2021-03-12 MED ORDER — OXYCODONE HCL 5 MG PO TABS: 5.0000 mg | ORAL_TABLET | Freq: Once | ORAL | Status: DC | PRN

## 2021-03-12 MED ORDER — FENTANYL CITRATE (PF) 250 MCG/5ML IJ SOLN
INTRAMUSCULAR | Status: DC | PRN
  Administered 2021-03-12 (×5): 50 ug via INTRAVENOUS

## 2021-03-12 MED ORDER — CITALOPRAM HYDROBROMIDE 20 MG PO TABS
10.0000 mg | ORAL_TABLET | Freq: Every day | ORAL | Status: DC
  Administered 2021-03-12 – 2021-03-16 (×5): 10 mg via ORAL
  Filled 2021-03-12 (×5): qty 1

## 2021-03-12 MED ORDER — ROCURONIUM BROMIDE 10 MG/ML (PF) SYRINGE
PREFILLED_SYRINGE | INTRAVENOUS | Status: AC
  Filled 2021-03-12: qty 10

## 2021-03-12 MED ORDER — PANTOPRAZOLE SODIUM 40 MG PO TBEC
40.0000 mg | DELAYED_RELEASE_TABLET | Freq: Every day | ORAL | Status: DC
  Administered 2021-03-12 – 2021-03-16 (×5): 40 mg via ORAL
  Filled 2021-03-12 (×5): qty 1

## 2021-03-12 MED ORDER — LOSARTAN POTASSIUM 50 MG PO TABS
50.0000 mg | ORAL_TABLET | Freq: Every day | ORAL | Status: DC
  Administered 2021-03-13: 50 mg via ORAL
  Filled 2021-03-12: qty 1

## 2021-03-12 MED ORDER — ONDANSETRON HCL 4 MG/2ML IJ SOLN: 4.0000 mg | Freq: Once | INTRAMUSCULAR | Status: AC

## 2021-03-12 MED ORDER — SUCCINYLCHOLINE CHLORIDE 200 MG/10ML IV SOSY
PREFILLED_SYRINGE | INTRAVENOUS | Status: AC
  Filled 2021-03-12: qty 10

## 2021-03-12 MED ORDER — ONDANSETRON HCL 4 MG/2ML IJ SOLN
INTRAMUSCULAR | Status: AC
  Filled 2021-03-12: qty 2

## 2021-03-12 MED ORDER — OXYCODONE HCL 5 MG PO TABS
10.0000 mg | ORAL_TABLET | ORAL | Status: DC | PRN
  Administered 2021-03-13 – 2021-03-15 (×4): 10 mg via ORAL
  Filled 2021-03-12 (×4): qty 2

## 2021-03-12 MED ORDER — PHENOL 1.4 % MT LIQD: 1.0000 | OROMUCOSAL | Status: DC | PRN

## 2021-03-12 MED ORDER — ENOXAPARIN SODIUM 40 MG/0.4ML IJ SOSY
40.0000 mg | PREFILLED_SYRINGE | Freq: Once | INTRAMUSCULAR | Status: AC
  Administered 2021-03-12: 40 mg via SUBCUTANEOUS
  Filled 2021-03-12: qty 0.4

## 2021-03-12 MED ORDER — BUPIVACAINE-EPINEPHRINE 0.25% -1:200000 IJ SOLN
INTRAMUSCULAR | Status: DC | PRN
Start: 2021-03-12 — End: 2021-03-12
  Administered 2021-03-12: 30 mL

## 2021-03-12 MED ORDER — 0.9 % SODIUM CHLORIDE (POUR BTL) OPTIME
TOPICAL | Status: DC | PRN
  Administered 2021-03-12: 2000 mL

## 2021-03-12 MED ORDER — FENTANYL CITRATE PF 50 MCG/ML IJ SOSY
25.0000 ug | PREFILLED_SYRINGE | INTRAMUSCULAR | Status: DC | PRN
  Administered 2021-03-12: 25 ug via INTRAVENOUS

## 2021-03-12 MED ORDER — ORAL CARE MOUTH RINSE: 15.0000 mL | Freq: Once | OROMUCOSAL | Status: AC

## 2021-03-12 MED ORDER — BUPIVACAINE LIPOSOME 1.3 % IJ SUSP: 20.0000 mL | Freq: Once | INTRAMUSCULAR | Status: DC

## 2021-03-12 MED ORDER — SUCCINYLCHOLINE CHLORIDE 200 MG/10ML IV SOSY
PREFILLED_SYRINGE | INTRAVENOUS | Status: DC | PRN
  Administered 2021-03-12: 120 mg via INTRAVENOUS

## 2021-03-12 MED ORDER — PROPOFOL 10 MG/ML IV BOLUS
INTRAVENOUS | Status: DC | PRN
Start: 2021-03-12 — End: 2021-03-12
  Administered 2021-03-12: 100 mg via INTRAVENOUS

## 2021-03-12 MED ORDER — BUPIVACAINE LIPOSOME 1.3 % IJ SUSP
INTRAMUSCULAR | Status: DC | PRN
  Administered 2021-03-12: 20 mL

## 2021-03-12 MED ORDER — LIDOCAINE HCL (PF) 2 % IJ SOLN
INTRAMUSCULAR | Status: AC
  Filled 2021-03-12: qty 5

## 2021-03-12 MED ORDER — METOPROLOL SUCCINATE ER 25 MG PO TB24
25.0000 mg | ORAL_TABLET | Freq: Every day | ORAL | Status: DC
Start: 2021-03-12 — End: 2021-03-16
  Administered 2021-03-13 – 2021-03-16 (×3): 25 mg via ORAL
  Filled 2021-03-12 (×3): qty 1

## 2021-03-12 MED ORDER — ACETAMINOPHEN 500 MG PO TABS: 1000.0000 mg | ORAL_TABLET | Freq: Once | ORAL | Status: DC

## 2021-03-12 MED ORDER — ONDANSETRON HCL 4 MG/2ML IJ SOLN: 4.0000 mg | Freq: Once | INTRAMUSCULAR | Status: DC | PRN

## 2021-03-12 MED ORDER — FENTANYL CITRATE PF 50 MCG/ML IJ SOSY
PREFILLED_SYRINGE | INTRAMUSCULAR | Status: AC
  Filled 2021-03-12: qty 2

## 2021-03-12 MED ORDER — LIDOCAINE 2% (20 MG/ML) 5 ML SYRINGE
INTRAMUSCULAR | Status: DC | PRN
  Administered 2021-03-12: 60 mg via INTRAVENOUS

## 2021-03-12 MED ORDER — PHENYLEPHRINE HCL-NACL 20-0.9 MG/250ML-% IV SOLN
INTRAVENOUS | Status: DC | PRN
  Administered 2021-03-12: 35 ug/min via INTRAVENOUS

## 2021-03-12 MED ORDER — FENTANYL CITRATE (PF) 250 MCG/5ML IJ SOLN
INTRAMUSCULAR | Status: AC
  Filled 2021-03-12: qty 5

## 2021-03-12 MED ORDER — ENSURE PRE-SURGERY PO LIQD
592.0000 mL | Freq: Once | ORAL | Status: DC
  Filled 2021-03-12: qty 592

## 2021-03-12 MED ORDER — PHENYLEPHRINE 40 MCG/ML (10ML) SYRINGE FOR IV PUSH (FOR BLOOD PRESSURE SUPPORT)
PREFILLED_SYRINGE | INTRAVENOUS | Status: AC
  Filled 2021-03-12: qty 10

## 2021-03-12 MED ORDER — BUPIVACAINE-EPINEPHRINE (PF) 0.25% -1:200000 IJ SOLN
INTRAMUSCULAR | Status: AC
  Filled 2021-03-12: qty 30

## 2021-03-12 MED ORDER — ONDANSETRON HCL 4 MG/2ML IJ SOLN
INTRAMUSCULAR | Status: AC
  Administered 2021-03-12: 4 mg via INTRAVENOUS
  Filled 2021-03-12: qty 2

## 2021-03-12 MED ORDER — ENSURE PRE-SURGERY PO LIQD
296.0000 mL | Freq: Once | ORAL | Status: DC
  Filled 2021-03-12: qty 296

## 2021-03-12 MED ORDER — SUGAMMADEX SODIUM 200 MG/2ML IV SOLN
INTRAVENOUS | Status: DC | PRN
  Administered 2021-03-12: 200 mg via INTRAVENOUS

## 2021-03-12 MED ORDER — LORAZEPAM 0.5 MG PO TABS
0.2500 mg | ORAL_TABLET | Freq: Two times a day (BID) | ORAL | Status: DC | PRN
  Administered 2021-03-12 – 2021-03-14 (×2): 0.25 mg via ORAL
  Filled 2021-03-12 (×2): qty 1

## 2021-03-12 SURGICAL SUPPLY — 76 items
APPLIER CLIP 5 13 M/L LIGAMAX5 (MISCELLANEOUS)
APPLIER CLIP ROT 10 11.4 M/L (STAPLE)
BAG COUNTER SPONGE SURGICOUNT (BAG) IMPLANT
BLADE EXTENDED COATED 6.5IN (ELECTRODE) IMPLANT
CABLE HIGH FREQUENCY MONO STRZ (ELECTRODE) ×1 IMPLANT
CELLS DAT CNTRL 66122 CELL SVR (MISCELLANEOUS) IMPLANT
CLIP APPLIE 5 13 M/L LIGAMAX5 (MISCELLANEOUS) IMPLANT
CLIP APPLIE ROT 10 11.4 M/L (STAPLE) IMPLANT
COVER SURGICAL LIGHT HANDLE (MISCELLANEOUS) ×4 IMPLANT
DECANTER SPIKE VIAL GLASS SM (MISCELLANEOUS) ×1 IMPLANT
DERMABOND ADVANCED (GAUZE/BANDAGES/DRESSINGS)
DERMABOND ADVANCED .7 DNX12 (GAUZE/BANDAGES/DRESSINGS) IMPLANT
DRAIN CHANNEL 19F RND (DRAIN) IMPLANT
DRSG OPSITE POSTOP 4X10 (GAUZE/BANDAGES/DRESSINGS) IMPLANT
DRSG OPSITE POSTOP 4X6 (GAUZE/BANDAGES/DRESSINGS) IMPLANT
DRSG OPSITE POSTOP 4X8 (GAUZE/BANDAGES/DRESSINGS) ×1 IMPLANT
ELECT REM PT RETURN 15FT ADLT (MISCELLANEOUS) ×2 IMPLANT
ENDOLOOP SUT PDS II  0 18 (SUTURE)
ENDOLOOP SUT PDS II 0 18 (SUTURE) IMPLANT
GAUZE SPONGE 4X4 12PLY STRL (GAUZE/BANDAGES/DRESSINGS) IMPLANT
GLOVE SURG ENC MOIS LTX SZ6 (GLOVE) ×4 IMPLANT
GLOVE SURG MICRO LTX SZ6 (GLOVE) ×2 IMPLANT
GLOVE SURG UNDER LTX SZ6.5 (GLOVE) ×4 IMPLANT
GOWN STRL REUS W/TWL LRG LVL3 (GOWN DISPOSABLE) ×4 IMPLANT
GOWN STRL REUS W/TWL XL LVL3 (GOWN DISPOSABLE) ×4 IMPLANT
IRRIG SUCT STRYKERFLOW 2 WTIP (MISCELLANEOUS) ×2
IRRIGATION SUCT STRKRFLW 2 WTP (MISCELLANEOUS) ×1 IMPLANT
KIT TURNOVER KIT A (KITS) ×1 IMPLANT
NDL INSUFFLATION 14GA 120MM (NEEDLE) ×1 IMPLANT
NEEDLE INSUFFLATION 14GA 120MM (NEEDLE) ×2 IMPLANT
PACK COLON (CUSTOM PROCEDURE TRAY) ×2 IMPLANT
PAD POSITIONING PINK XL (MISCELLANEOUS) ×2 IMPLANT
PENCIL SMOKE EVACUATOR (MISCELLANEOUS) IMPLANT
RELOAD PROXIMATE 75MM BLUE (ENDOMECHANICALS) ×6 IMPLANT
RELOAD STAPLE 60 2.6 WHT THN (STAPLE) IMPLANT
RELOAD STAPLE 75 3.8 BLU REG (ENDOMECHANICALS) IMPLANT
RELOAD STAPLER WHITE 60MM (STAPLE) IMPLANT
RETRACTOR WND ALEXIS 18 MED (MISCELLANEOUS) IMPLANT
RTRCTR WOUND ALEXIS 18CM MED (MISCELLANEOUS)
SCISSORS LAP 5X35 DISP (ENDOMECHANICALS) ×2 IMPLANT
SEALER TISSUE G2 STRG ARTC 35C (ENDOMECHANICALS) ×1 IMPLANT
SET TUBE SMOKE EVAC HIGH FLOW (TUBING) ×2 IMPLANT
SHEARS HARMONIC ACE PLUS 36CM (ENDOMECHANICALS) ×1 IMPLANT
SLEEVE XCEL OPT CAN 5 100 (ENDOMECHANICALS) ×4 IMPLANT
SPONGE T-LAP 18X18 ~~LOC~~+RFID (SPONGE) ×2 IMPLANT
STAPLER ECHELON LONG 60 440 (INSTRUMENTS) IMPLANT
STAPLER PROXIMATE 75MM BLUE (STAPLE) ×1 IMPLANT
STAPLER RELOAD WHITE 60MM (STAPLE)
STAPLER VISISTAT 35W (STAPLE) IMPLANT
SUT MNCRL AB 4-0 PS2 18 (SUTURE) ×1 IMPLANT
SUT PDS AB 0 CT1 36 (SUTURE) IMPLANT
SUT PDS AB 1 CTX 36 (SUTURE) IMPLANT
SUT PDS AB 2-0 CT2 27 (SUTURE) ×2 IMPLANT
SUT PROLENE 2 0 KS (SUTURE) IMPLANT
SUT PROLENE 2 0 SH DA (SUTURE) IMPLANT
SUT SILK 2 0 (SUTURE)
SUT SILK 2 0 SH CR/8 (SUTURE) ×2 IMPLANT
SUT SILK 2-0 18XBRD TIE 12 (SUTURE) ×1 IMPLANT
SUT SILK 3 0 (SUTURE) ×1
SUT SILK 3 0 SH CR/8 (SUTURE) ×2 IMPLANT
SUT SILK 3-0 18XBRD TIE 12 (SUTURE) ×1 IMPLANT
SUT VIC AB 2-0 SH 27 (SUTURE)
SUT VIC AB 2-0 SH 27X BRD (SUTURE) IMPLANT
SUT VIC AB 3-0 SH 18 (SUTURE) ×2 IMPLANT
SUT VIC AB 3-0 SH 27 (SUTURE)
SUT VIC AB 3-0 SH 27XBRD (SUTURE) IMPLANT
SUT VIC AB 3-0 SH 8-18 (SUTURE) ×1 IMPLANT
SYS LAPSCP GELPORT 120MM (MISCELLANEOUS) ×2
SYS WOUND ALEXIS 18CM MED (MISCELLANEOUS)
SYSTEM LAPSCP GELPORT 120MM (MISCELLANEOUS) IMPLANT
SYSTEM WOUND ALEXIS 18CM MED (MISCELLANEOUS) IMPLANT
TOWEL OR NON WOVEN STRL DISP B (DISPOSABLE) ×2 IMPLANT
TRAY FOLEY MTR SLVR 16FR STAT (SET/KITS/TRAYS/PACK) IMPLANT
TROCAR BLADELESS OPT 5 100 (ENDOMECHANICALS) ×2 IMPLANT
TROCAR XCEL 12X100 BLDLESS (ENDOMECHANICALS) ×1 IMPLANT
TUBING CONNECTING 10 (TUBING) ×4 IMPLANT

## 2021-03-12 NOTE — Discharge Instructions (Signed)
 COLECTOMY POST OPERATIVE INSTRUCTIONS  Thinking Clearly  The anesthesia may cause you to feel different for 1 or 2 days. Do not drive, drink alcohol, or make any big decisions for at least 2 days.  Nutrition When you wake up, you will be able to drink small amounts of liquid. If you do not feel sick, you can slowly advance your diet to regular foods. Continue to drink lots of fluids, usually about 8 to 10 glasses per day. Eat a high-fiber diet so you don't strain during bowel movements. High-Fiber Foods Foods high in fiber include beans, bran cereals and whole-grain breads, peas, dried fruit (figs, apricots, and dates), raspberries, blackberries, strawberries, sweet corn, broccoli, baked potatoes with skin, plums, pears, apples, greens, and nuts. Activity Slowly increase your activity. Be sure to get up and walk every hour or so to prevent blood clots. No heavy lifting or strenuous activity for 4 weeks following surgery to prevent hernias at your incision sites It is normal to feel tired. You may need more sleep than usual.  Get your rest but make sure to get up and move around frequently to prevent blood clots and pneumonia.  Work and Return to School You can go back to work when you feel well enough. Discuss the timing with your surgeon. You can usually go back to school or work 1 week or less after an laparoscopic surgery and two weeks after an open surgery. If your work requires heavy lifting or strenuous activity you need to be placed on light duty for 4 weeks following surgery. You can return to gym class, sports or other physical activities 4 weeks after surgery.  Wound Care Always wash your hands before and after touching near your incision site. Do not soak in a bathtub until cleared at your follow up appointment. You may take a shower 24 hours after surgery. A small amount of drainage from the incision is normal. If the drainage is thick and yellow or the site is red, you may  have an infection, so call your surgeon. If you have a drain in one of your incisions, it will be taken out in office when the drainage stops. Steri-Strips will fall off in 7 to 10 days or they will be removed during your first office visit. If you have dermabond glue covering over the incision, allow the glue to flake off on its own. Avoid wearing tight or rough clothing. It may rub your incisions and make it harder for them to heal. Protect the new skin, especially from the sun. The sun can burn and cause darker scarring. Your scar will heal in about 4 to 6 weeks and will become softer and continue to fade over the next year.  The cosmetic appearance of the incisions will improve over the course of the first year after surgery. Sensation around your incision will return in a few weeks or months.  Bowel Movements After intestinal surgery, you may have loose watery stools for several days. If watery diarrhea lasts longer than 3 days, contact your surgeon. Pain medication (narcotics) can cause constipation. Increase the fiber in your diet with high-fiber foods if you are constipated. You can take an over the counter stool softener like Colace to avoid constipation.  Additional over the counter medications can also be used if Colace isn't sufficient (for example, Milk of Magnesia or Miralax).  Pain The amount of pain is different for each person. Some people need only 1 to 3 doses of pain control   medication, while others need more. Take alternating doses of tylenol and ibuprofen around the clock for the first five days following surgery.  This will provide a baseline of pain control and help with inflammation.  Take the narcotic pain medication in addition if needed for severe pain.  Contact Your Surgeon at [phone], if you have: Pain that will not go away Pain that gets worse A fever of more than 101F (38.3C) Repeated vomiting Swelling, redness, bleeding, or bad-smelling drainage from your  wound site Strong abdominal pain No bowel movement or unable to pass gas for 3 days Watery diarrhea lasting longer than 3 days  Pain Control The goal of pain control is to minimize pain, keep you moving and help you heal. Your surgical team will work with you on your pain plan. Most often a combination of therapies and medications are used to control your pain. You may also be given medication (local anesthetic) at the surgical site. This may help control your pain for several days. Extreme pain puts extra stress on your body at a time when your body needs to focus on healing. Do not wait until your pain has reached a level "10" or is unbearable before telling your doctor or nurse. It is much easier to control pain before it becomes severe. Following a laparoscopic procedure, pain is sometimes felt in the shoulder. This is due to the gas inserted into your abdomen during the procedure. Moving and walking helps to decrease the gas and the right shoulder pain.  Use the guide below for ways to manage your post-operative pain. Learn more by going to facs.org/safepaincontrol.  How Intense Is My Pain Common Therapies to Feel Better       I hardly notice my pain, and it does not interfere with my activities.  I notice my pain and it distracts me, but I can still do activities (sitting up, walking, standing).  Non-Medication Therapies  Ice (in a bag, applied over clothing at the surgical site), elevation, rest, meditation, massage, distraction (music, TV, play) walking and mild exercise Splinting the abdomen with pillows +  Non-Opioid Medications Acetaminophen (Tylenol) Non-steroidal anti-inflammatory drugs (NSAIDS) Aspirin, Ibuprofen (Motrin, Advil) Naproxen (Aleve) Take these as needed, when you feel pain. Both acetaminophen and NSAIDs help to decrease pain and swelling (inflammation).      My pain is hard to ignore and is more noticeable even when I rest.  My pain interferes with  my usual activities.  Non-Medication Therapies  +  Non-Opioid medications  Take on a regular schedule (around-the-clock) instead of as needed. (For example, Tylenol every 6 hours at 9:00 am, 3:00 pm, 9:00 pm, 3:00 am and Motrin every 6 hours at 12:00 am, 6:00 am, 12:00 pm, 6:00 pm)         I am focused on my pain, and I am not doing my daily activities.  I am groaning in pain, and I cannot sleep. I am unable to do anything.  My pain is as bad as it could be, and nothing else matters.  Non-Medication Therapies  +  Around-the-Clock Non-Opioid Medications  +  Short-acting opioids  Opioids should be used with other medications to manage severe pain. Opioids block pain and give a feeling of euphoria (feel high). Addiction, a serious side effect of opioids, is rare with short-term (a few days) use.  Examples of short-acting opioids include: Tramadol (Ultram), Hydrocodone (Norco, Vicodin), Hydromorphone (Dilaudid), Oxycodone (Oxycontin)     The above directions have been adapted from   the American College of [employer] Surgical Patient Education Program.  Please refer to the ACS website if needed: https://www.facs.org/-/media/files/education/patient-ed/colectomy.ashx   Linda Mishkin, MD [facility] [address], [address], [place], [place]  [address] ?  [address] [address], , [place]   [address] [phone] ? [phone] ? FAX [phone] Web site: [email or web address]  

## 2021-03-12 NOTE — Anesthesia Postprocedure Evaluation (Signed)
Anesthesia Post Note  Patient: Linda Glass  Procedure(s) Performed: LAPAROSCOPIC HAND ASSISTED RIGHT COLECTOMY (Right: Abdomen)     Patient location during evaluation: PACU Anesthesia Type: General Level of consciousness: awake and alert Pain management: pain level controlled Vital Signs Assessment: post-procedure vital signs reviewed and stable Respiratory status: spontaneous breathing, nonlabored ventilation and respiratory function stable Cardiovascular status: stable and blood pressure returned to baseline Anesthetic complications: no   No notable events documented.  Last Vitals:  Vitals:   03/12/21 1030 03/12/21 1053  BP: (!) 124/43 (!) 130/44  Pulse: 60 (!) 57  Resp: 18 16  Temp:  36.6 C  SpO2: 100% 100%    Last Pain:  Vitals:   03/12/21 1030  TempSrc:   PainSc: 0-No pain                 Audry Pili

## 2021-03-12 NOTE — H&P (Signed)
Admitting Physician: Nickola Major Shavontae Gibeault  Service: General Surgery  CC: Tubular adenoma of the right colon  Subjective   HPI: Linda Glass is an 88 y.o. female who is here for right colectomy.  History of Present Illness from office visit 12/07/20: Linda Glass is a 88 y.o. female who is seen today as an office consultation at the request of Dr. Sheppard Penton for evaluation of 5cm right colon tubular adenoma .   Linda Glass was seen in the hospital for anemia and COVID back in March and was found to have a 5 cm cecal polyp which was biopsied showing a tubular adenoma. EMR was considered, however was felt to be too large for EMR and may require multiple sessions to fully remove this, so she was referred to the surgery office for evaluation of right colectomy. She is eating and drinking well and her recent blood counts have looked much better.   Past Medical History:  Diagnosis Date   Anemia 04/2020   Anxiety    Arthritis    Chicken pox    COVID-19    Heart murmur    Hyperlipidemia    Hypertension     Past Surgical History:  Procedure Laterality Date   ABDOMINAL HYSTERECTOMY  1983   BIOPSY  05/05/2020   Procedure: BIOPSY;  Surgeon: Mauri Pole, MD;  Location: [facility];  Service: Endoscopy;;  EGD and COLON   COLONOSCOPY WITH PROPOFOL N/A 05/05/2020   Procedure: COLONOSCOPY WITH PROPOFOL;  Surgeon: Mauri Pole, MD;  Location: [facility];  Service: Endoscopy;  Laterality: N/A;   ESOPHAGOGASTRODUODENOSCOPY (EGD) WITH PROPOFOL N/A 05/05/2020   Procedure: ESOPHAGOGASTRODUODENOSCOPY (EGD) WITH PROPOFOL;  Surgeon: Mauri Pole, MD;  Location: [facility];  Service: Endoscopy;  Laterality: N/A;   FRACTURE SURGERY     wrist fracture 2013    Family History  Problem Relation Age of Onset   Hyperlipidemia Mother    Diabetes Neg Hx     Social:  reports that she has never smoked. She has never used smokeless tobacco. She reports current  alcohol use of about 7.0 standard drinks per week. She reports that she does not use drugs.  Allergies: No Known Allergies  Medications: Current Outpatient Medications  Medication Instructions   acetaminophen (TYLENOL) 500 mg, Oral, Every 6 hours PRN   atorvastatin (LIPITOR) 40 MG tablet TAKE 1 TABLET EACH DAY.   citalopram (CELEXA) 10 mg, Oral, Daily   Cyanocobalamin (VITAMIN B 12 PO) 500 mcg, Oral, Daily   ELIQUIS 5 MG TABS tablet TAKE ONE TABLET BY MOUTH TWICE DAILY   Ferrous Gluconate 324 (37.5 Fe) MG TABS 1 tablet, Oral, Daily   furosemide (LASIX) 80 mg, Oral, Daily   LORazepam (ATIVAN) 0.5 MG tablet TAKE 1/2 TABLET AT BEDTIME AS NEEDED FOR ANXIETY.   losartan (COZAAR) 50 MG tablet TAKE ONE TABLET BY MOUTH ONCE DAILY   magnesium oxide (MAG-OX) 200 mg, Oral, Daily   metoprolol succinate (TOPROL-XL) 25 mg, Oral, Daily   Multiple Vitamin (MULTIVITAMIN WITH MINERALS) TABS tablet 1 tablet, Oral, Daily   pantoprazole (PROTONIX) 40 mg, Oral, Daily   spironolactone (ALDACTONE) 25 mg, Oral, Daily    ROS - all of the below systems have been reviewed with the patient and positives are indicated with bold text General: chills, fever or night sweats Eyes: blurry vision or double vision ENT: epistaxis or sore throat Allergy/Immunology: itchy/watery eyes or nasal congestion Hematologic/Lymphatic: bleeding problems, blood clots or swollen lymph nodes Endocrine: temperature intolerance or unexpected  weight changes Breast: new or changing breast lumps or nipple discharge Resp: cough, shortness of breath, or wheezing CV: chest pain or dyspnea on exertion GI: as per HPI GU: dysuria, trouble voiding, or hematuria MSK: joint pain or joint stiffness Neuro: TIA or stroke symptoms Derm: pruritus and skin lesion changes Psych: anxiety and depression  Objective   PE Blood pressure (!) 145/69, pulse 65, temperature 98.3 F (36.8 C), temperature source Oral, resp. rate 16, height 5\' 1"  (1.549 m),  weight 71.2 kg, SpO2 100 %. Constitutional: NAD; conversant; no deformities Eyes: Moist conjunctiva; no lid lag; anicteric; PERRL Neck: Trachea midline; no thyromegaly Lungs: Normal respiratory effort; no tactile fremitus CV: RRR; no palpable thrills; no pitting edema GI: Abd Soft, non-tender; no palpable hepatosplenomegaly MSK: Normal range of motion of extremities; no clubbing/cyanosis Psychiatric: Appropriate affect; alert and oriented x3 Lymphatic: No palpable cervical or axillary lymphadenopathy  No results found for this or any previous visit (from the past 24 hour(s)).  Imaging Orders  No imaging studies ordered today  CT abd/pel 11/21/20 No evidence of abdominal or pelvic metastatic disease.   Sigmoid colonic diverticulosis, without radiographic evidence of diverticulitis.   Large hiatal hernia containing majority of the stomach.   5.4 cm benign-appearing cyst in central pelvis, suspicious for ovarian cyst, with postop fluid collection considered less likely. Recommend follow-up US in 6-12 months. Note: This recommendation does not apply to premenarchal patients and to those with increased risk (genetic, family history, elevated tumor markers or other high-risk factors) of ovarian cancer. Reference: [place] 2020 Feb; 17(2):248-254    Assessment and Plan   Linda Glass is an 88 y.o. female with a tubular adenoma of the right colon ~5 cm in size.  I recommended laparoscopic, hand-assisted, right colectomy. The procedure itself as well as its risk, benefits, and alternatives were discussed with the patient who granted consent to proceed. We will proceed as scheduled.  Felicie Morn, MD  [facility] Use [email or web address] to contact on call provider

## 2021-03-12 NOTE — Anesthesia Procedure Notes (Addendum)
Procedure Name: Intubation Date/Time: 03/12/2021 7:29 AM Performed by: Maxwell Caul, CRNA Pre-anesthesia Checklist: Patient identified, Emergency Drugs available, Suction available and Patient being monitored Patient Re-evaluated:Patient Re-evaluated prior to induction Oxygen Delivery Method: Circle system utilized Preoxygenation: Pre-oxygenation with 100% oxygen Induction Type: IV induction and Rapid sequence Laryngoscope Size: Mac and 3 Grade View: Grade I Tube type: Oral Tube size: 7.0 mm Number of attempts: 1 Airway Equipment and Method: Stylet Placement Confirmation: ETT inserted through vocal cords under direct vision, positive ETCO2 and breath sounds checked- equal and bilateral Secured at: 21 cm Tube secured with: Tape Dental Injury: Teeth and Oropharynx as per pre-operative assessment

## 2021-03-12 NOTE — Transfer of Care (Signed)
Immediate Anesthesia Transfer of Care Note  Patient: [name]  Procedure(s) Performed: LAPAROSCOPIC HAND ASSISTED RIGHT COLECTOMY (Right: Abdomen)  Patient Location: PACU  Anesthesia Type:General  Level of Consciousness: awake, alert  and oriented  Airway & Oxygen Therapy: Patient Spontanous Breathing and Patient connected to face mask oxygen  Post-op Assessment: Report given to RN and Post -op Vital signs reviewed and stable  Post vital signs: Reviewed and stable  Last Vitals:  Vitals Value Taken Time  BP 157/47 03/12/21 0940  Temp    Pulse    Resp 16 03/12/21 0943  SpO2    Vitals shown include unvalidated device data.  Last Pain:  Vitals:   03/12/21 0631  TempSrc:   PainSc: 0-No pain         Complications: No notable events documented.

## 2021-03-12 NOTE — Op Note (Signed)
Patient: Linda Glass (11-29-1933, [id number])  Date of Surgery: 03/12/2021   Preoperative Diagnosis: RIGHT COLON POLYP   Postoperative Diagnosis: RIGHT COLON POLYP   Surgical Procedure: LAPAROSCOPIC HAND ASSISTED RIGHT COLECTOMY:    Operative Team Members:  Surgeon(s) and Role:    * Tarvares Lant, Nickola Major, MD - Primary    * Greer Pickerel, MD - Assisting   Anesthesiologist: Audry Pili, MD CRNA: Milford Cage, CRNA; Maxwell Caul, CRNA   Anesthesia: General   Fluids:  Total I/O In: 2000 [I.V.:1900; IV Piggyback:100] Out: 400 [Urine:350; GDJME:26]  Complications: None  Drains:  none   Specimen:  ID Type Source Tests Collected by Time Destination  1 : right colon Tissue PATH GI Other SURGICAL PATHOLOGY Linda Glass, Nickola Major, MD 03/12/2021 [phone]      Disposition:  PACU - hemodynamically stable.  Plan of Care: Admit to inpatient     Indications for Procedure: Francenia Glass is a 88 y.o. female who presented with a right colon tubular adenoma 5cm in size unable to be removed endoscopically.  The procedure itself as well as its risks, benefits and alternatives were discussed.  The risks discussed included but were not limited to the risk of infection, bleeding, damage to nearby structures, and anastomotic leak.  After a full discussion and all questions answered the patient granted consent to proceed.  Findings: Fullness of right colon near ileocecal valve   Description of Procedure:   On the date stated above the patient was taken operating room suite and placed in supine position.  General endotracheal anesthesia was induced.  A timeout was completed verifying the correct patient, procedure, position, and equipment needed for the case.  Made a midline laparotomy incision about the with my hand at the level of the umbilicus and dissected down to the fascia anterior abdominal wall.  The fascia was incised and the abdomen was entered.  A  GelPort was placed and the abdomen was insufflated to 15 mmHg.  2 additional 5 mm trochars were placed 1 suprapubic and one in the right lower quadrant.  The patient was placed in headdown left side down position.  The bipolar instrument was used to mobilize the right colon out of the retroperitoneum on the right side.  I was careful to protect the ureter and protect the duodenum.  The colon mobilized nicely and was able to be delivered through the midline laparotomy incision.  We leveled the patient out and converted to open surgery, working through the [facility] incision.  The ileocolic vascular bundle was isolated and secured with Vicryl suture and divided using the bipolar instrument.  The mesentery was divided to just past the hepatic flexure near the middle colic blood supply and to the terminal ileum proximal to the ileocecal valve.  This was completed using the bipolar instrument.  The terminal ileum was divided using the GIA stapler.  The proximal transverse colon was divided using the GIA stapler.  The specimen was passed off the field as right colon.  Palpating specimen there was some fullness around the ileocecal valve.  We then directed attention to to creating the anastomosis.  The defect in the mesentery was closed using running silk suture.  The anastomosis was created by creating enterotomies in the tinea of the proximal colon and of the antimesenteric border of the ileum.  A GIA 75 mm stapler was fired to create the anastomosis.  A second load of the GIA 75 mm stapler was used to close  the common enterotomy.  The staple line was oversewn using Vicryl suture.  A Vicryl suture was placed to reinforce the other end of the anastomotic staple line.  The omentum was tagged over the anastomosis using a Vicryl suture.  The anastomosis was returned to the abdomen the abdomen was irrigated with copious saline irrigation.  There was good hemostasis.  We then directed our attention to closure.  We  transitioned from the dirty instruments to a clean set of instruments scrubbed back in and redraped the patient.  The fascia was closed using running 2-0 PDS suture.  The subcutaneous tissues were reapproximated using Vicryl and the skin was closed using 4-0 Monocryl and Dermabond.  A honeycomb dressing was applied.  All sponge and needle counts were correct at the end of this case.  At the end of the case we reviewed the infection status of the case. Patient: Private Patient Elective Case Case: Elective Infection Present At Time Of Surgery (PATOS):  Contained contamination related to creating an ileocolic anastomosis  Linda Raw, MD General, Bariatric, & Minimally Invasive Surgery [facility], [place]

## 2021-03-13 ENCOUNTER — Encounter (HOSPITAL_COMMUNITY): Payer: Self-pay | Admitting: Surgery

## 2021-03-13 ENCOUNTER — Other Ambulatory Visit (HOSPITAL_COMMUNITY): Payer: Self-pay

## 2021-03-13 LAB — CBC
HCT: 25.9 % — ABNORMAL LOW (ref 36.0–46.0)
HCT: 22.7 % — ABNORMAL LOW (ref 36.0–46.0)
Hemoglobin: 8.4 g/dL — ABNORMAL LOW (ref 12.0–15.0)
Hemoglobin: 7.7 g/dL — ABNORMAL LOW (ref 12.0–15.0)
MCH: 33.9 pg (ref 26.0–34.0)
MCH: 34.8 pg — ABNORMAL HIGH (ref 26.0–34.0)
MCHC: 32.4 g/dL (ref 30.0–36.0)
MCHC: 33.9 g/dL (ref 30.0–36.0)
MCV: 104.4 fL — ABNORMAL HIGH (ref 80.0–100.0)
MCV: 102.7 fL — ABNORMAL HIGH (ref 80.0–100.0)
Platelets: 132 10*3/uL — ABNORMAL LOW (ref 150–400)
Platelets: 152 10*3/uL (ref 150–400)
RBC: 2.48 MIL/uL — ABNORMAL LOW (ref 3.87–5.11)
RBC: 2.21 MIL/uL — ABNORMAL LOW (ref 3.87–5.11)
RDW: 13.8 % (ref 11.5–15.5)
RDW: 14 % (ref 11.5–15.5)
WBC: 11.2 10*3/uL — ABNORMAL HIGH (ref 4.0–10.5)
WBC: 12.2 10*3/uL — ABNORMAL HIGH (ref 4.0–10.5)
nRBC: 0 % (ref 0.0–0.2)
nRBC: 0 % (ref 0.0–0.2)

## 2021-03-13 LAB — BASIC METABOLIC PANEL
Anion gap: 6 (ref 5–15)
Anion gap: 9 (ref 5–15)
BUN: 25 mg/dL — ABNORMAL HIGH (ref 8–23)
BUN: 27 mg/dL — ABNORMAL HIGH (ref 8–23)
CO2: 20 mmol/L — ABNORMAL LOW (ref 22–32)
CO2: 15 mmol/L — ABNORMAL LOW (ref 22–32)
Calcium: 8.8 mg/dL — ABNORMAL LOW (ref 8.9–10.3)
Calcium: 8.5 mg/dL — ABNORMAL LOW (ref 8.9–10.3)
Chloride: 101 mmol/L (ref 98–111)
Chloride: 99 mmol/L (ref 98–111)
Creatinine, Ser: 1.45 mg/dL — ABNORMAL HIGH (ref 0.44–1.00)
Creatinine, Ser: 1.8 mg/dL — ABNORMAL HIGH (ref 0.44–1.00)
GFR, Estimated: 35 mL/min — ABNORMAL LOW (ref >60)
GFR, Estimated: 27 mL/min — ABNORMAL LOW (ref >60)
Glucose, Bld: 141 mg/dL — ABNORMAL HIGH (ref 70–99)
Glucose, Bld: 149 mg/dL — ABNORMAL HIGH (ref 70–99)
Potassium: 5.2 mmol/L — ABNORMAL HIGH (ref 3.5–5.1)
Potassium: 4.6 mmol/L (ref 3.5–5.1)
Sodium: 127 mmol/L — ABNORMAL LOW (ref 135–145)
Sodium: 123 mmol/L — ABNORMAL LOW (ref 135–145)

## 2021-03-13 MED ORDER — ENOXAPARIN SODIUM 30 MG/0.3ML IJ SOSY
30.0000 mg | PREFILLED_SYRINGE | INTRAMUSCULAR | Status: DC
  Administered 2021-03-14 – 2021-03-16 (×2): 30 mg via SUBCUTANEOUS
  Filled 2021-03-13 (×2): qty 0.3

## 2021-03-13 MED ORDER — FUROSEMIDE 40 MG PO TABS
80.0000 mg | ORAL_TABLET | Freq: Every day | ORAL | Status: DC
  Administered 2021-03-13: 80 mg via ORAL
  Filled 2021-03-13: qty 2

## 2021-03-13 MED ORDER — SODIUM CHLORIDE 0.9 % IV BOLUS
1000.0000 mL | Freq: Once | INTRAVENOUS | Status: AC
  Administered 2021-03-13: 1000 mL via INTRAVENOUS

## 2021-03-13 MED ORDER — SPIRONOLACTONE 25 MG PO TABS
25.0000 mg | ORAL_TABLET | Freq: Every day | ORAL | Status: DC
  Administered 2021-03-13: 25 mg via ORAL
  Filled 2021-03-13: qty 1

## 2021-03-13 NOTE — Progress Notes (Signed)
Progress Note: General Surgery Service   Chief Complaint/Subjective: Doing well postoperative day 1. Nausea and diarrhea from the prep continued postoperatively.  Nausea improving this morning. Significant amount of urine output overnight with Foley being removed this morning.  Objective: Vital signs in last 24 hours: Temp:  [97.5 F (36.4 C)-98.6 F (37 C)] 97.6 F (36.4 C) (01/17 0534) Pulse Rate:  [51-90] 79 (01/17 0534) Resp:  [12-22] 18 (01/17 0534) BP: (94-157)/(43-63) 130/58 (01/17 0534) SpO2:  [95 %-100 %] 95 % (01/17 0534) Last BM Date: 03/12/21  Intake/Output from previous day: 01/16 0701 - 01/17 0700 In: 3259.9 [P.O.:360; I.V.:2799.9; IV Piggyback:100] Out: 2001 [Urine:1950; Stool:1; Blood:50] Intake/Output this shift: No intake/output data recorded.  GI: Abd incision clean dry and intact with glue  Lab Results: CBC  Recent Labs    03/12/21 1010 03/13/21 0524  WBC 9.6 11.2*  HGB 11.7* 8.4*  HCT 36.0 25.9*  PLT 142* 132*   BMET Recent Labs    03/12/21 1010 03/13/21 0359  NA  --  127*  K  --  5.2*  CL  --  101  CO2  --  20*  GLUCOSE  --  141*  BUN  --  25*  CREATININE 1.13* 1.45*  CALCIUM  --  8.8*   PT/INR No results for input(s): LABPROT, INR in the last 72 hours. ABG No results for input(s): PHART, HCO3 in the last 72 hours.  Invalid input(s): PCO2, PO2  Anti-infectives: Anti-infectives (From admission, onward)    Start     Dose/Rate Route Frequency Ordered Stop   03/12/21 0600  cefoTEtan (CEFOTAN) 2 g in sodium chloride 0.9 % 100 mL IVPB        2 g 200 mL/hr over 30 Minutes Intravenous On call to O.R. 03/12/21 0508 03/12/21 0800       Medications: Scheduled Meds:  acetaminophen  650 mg Oral Q6H   atorvastatin  40 mg Oral Daily   citalopram  10 mg Oral Daily   docusate sodium  100 mg Oral BID   enoxaparin (LOVENOX) injection  40 mg Subcutaneous Q24H   feeding supplement  237 mL Oral BID BM   furosemide  80 mg Oral Daily    losartan  50 mg Oral Daily   metoprolol succinate  25 mg Oral Daily   pantoprazole  40 mg Oral Daily   spironolactone  25 mg Oral Daily   Continuous Infusions:  methocarbamol (ROBAXIN) IV     PRN Meds:.HYDROmorphone (DILAUDID) injection, LORazepam, methocarbamol (ROBAXIN) IV, ondansetron (ZOFRAN) IV, oxyCODONE, oxyCODONE, phenol, prochlorperazine, simethicone  Assessment/Plan: Ms. Shelvin is an 88 year old female who presented due to a colon polyp near the ileocecal valve.  She is status post laparoscopic, hand-assisted, right colectomy on 03/12/2021.  Advance diet as tolerated Stop IV fluids Restart home diuretic dosing Hyperkalemia -chronic issue, monitor, increase Lasix dose, recheck labs this afternoon Acute blood loss anemia combined with dilution anemia -monitor and transfuse only if needed.  Recheck labs this afternoon. Pain and nausea medications as needed Increase activity Incentive spirometer    LOS: 1 day   FEN: Advance diet as tolerated ID: None VTE: Lovenox and SCDs Foley: Removed this morning Dispo: Continued care on the floor    Felicie Morn, MD  [facility] Use [email or web address] to contact on call provider  Daily Billing: [phone] - post op

## 2021-03-13 NOTE — Progress Notes (Signed)
Transition of Care [facility]) Screening Note  Patient Details  Name: Linda Glass Date of Birth: Dec 16, 1933  Transition of Care [facility]) CM/SW Contact:    Sherie Don, LCSW Phone Number: 03/13/2021, 11:14 AM  Transition of Care Department [facility]) has reviewed patient and no TOC needs have been identified at this time. We will continue to monitor patient advancement through interdisciplinary progression rounds. If new patient transition needs arise, please place a TOC consult.

## 2021-03-13 NOTE — Progress Notes (Signed)
Pt got up to go to the bathroom and felt faint, was unsteady on her feet. BP is low, MD notified. See new orders.

## 2021-03-14 LAB — BASIC METABOLIC PANEL
Anion gap: 6 (ref 5–15)
Anion gap: 5 (ref 5–15)
BUN: 26 mg/dL — ABNORMAL HIGH (ref 8–23)
BUN: 26 mg/dL — ABNORMAL HIGH (ref 8–23)
CO2: 18 mmol/L — ABNORMAL LOW (ref 22–32)
CO2: 19 mmol/L — ABNORMAL LOW (ref 22–32)
Calcium: 8.7 mg/dL — ABNORMAL LOW (ref 8.9–10.3)
Calcium: 8.7 mg/dL — ABNORMAL LOW (ref 8.9–10.3)
Chloride: 103 mmol/L (ref 98–111)
Chloride: 99 mmol/L (ref 98–111)
Creatinine, Ser: 1.2 mg/dL — ABNORMAL HIGH (ref 0.44–1.00)
Creatinine, Ser: 1.13 mg/dL — ABNORMAL HIGH (ref 0.44–1.00)
GFR, Estimated: 44 mL/min — ABNORMAL LOW (ref >60)
GFR, Estimated: 48 mL/min — ABNORMAL LOW (ref >60)
Glucose, Bld: 158 mg/dL — ABNORMAL HIGH (ref 70–99)
Glucose, Bld: 133 mg/dL — ABNORMAL HIGH (ref 70–99)
Potassium: 4.3 mmol/L (ref 3.5–5.1)
Potassium: 4.1 mmol/L (ref 3.5–5.1)
Sodium: 127 mmol/L — ABNORMAL LOW (ref 135–145)
Sodium: 123 mmol/L — ABNORMAL LOW (ref 135–145)

## 2021-03-14 LAB — CBC
HCT: 21.1 % — ABNORMAL LOW (ref 36.0–46.0)
HCT: 22 % — ABNORMAL LOW (ref 36.0–46.0)
Hemoglobin: 7 g/dL — ABNORMAL LOW (ref 12.0–15.0)
Hemoglobin: 7.3 g/dL — ABNORMAL LOW (ref 12.0–15.0)
MCH: 35.2 pg — ABNORMAL HIGH (ref 26.0–34.0)
MCH: 34.9 pg — ABNORMAL HIGH (ref 26.0–34.0)
MCHC: 33.2 g/dL (ref 30.0–36.0)
MCHC: 33.2 g/dL (ref 30.0–36.0)
MCV: 106 fL — ABNORMAL HIGH (ref 80.0–100.0)
MCV: 105.3 fL — ABNORMAL HIGH (ref 80.0–100.0)
Platelets: 132 10*3/uL — ABNORMAL LOW (ref 150–400)
Platelets: 136 10*3/uL — ABNORMAL LOW (ref 150–400)
RBC: 1.99 MIL/uL — ABNORMAL LOW (ref 3.87–5.11)
RBC: 2.09 MIL/uL — ABNORMAL LOW (ref 3.87–5.11)
RDW: 14 % (ref 11.5–15.5)
RDW: 14.2 % (ref 11.5–15.5)
WBC: 10.3 10*3/uL (ref 4.0–10.5)
WBC: 12.2 10*3/uL — ABNORMAL HIGH (ref 4.0–10.5)
nRBC: 0 % (ref 0.0–0.2)
nRBC: 0 % (ref 0.0–0.2)

## 2021-03-14 LAB — SURGICAL PATHOLOGY

## 2021-03-14 NOTE — Progress Notes (Signed)
°   03/14/21 1200  Mobility  Activity Ambulated with assistance in hallway  Level of Assistance Standby assist, set-up cues, supervision of patient - no hands on  Assistive Device Front wheel walker  Distance Ambulated (ft) 300 ft  Activity Response Tolerated well  $Mobility charge 1 Mobility   Pt agreeable to mobilize this morning. Ambulated about 324ft in hall with RW, tolerated well. Pt required verbal queues for pacing. Otherwise, no complaints. Left in chair with call bell at side and daughter present. Requested I come back in the afternoon. Will check back as schedule permits.   [place] Specialist Acute Rehab Services Office: [phone]

## 2021-03-14 NOTE — Progress Notes (Signed)
Progress Note: General Surgery Service   Chief Complaint/Subjective: In good spirits this morning.  No nausea.  Belching, liquid bowel movements, passing flatus.  Asking for solid food.    Objective: Vital signs in last 24 hours: Temp:  [97.5 F (36.4 C)-98 F (36.7 C)] 97.8 F (36.6 C) (01/18 0434) Pulse Rate:  [58-105] 61 (01/18 0434) Resp:  [16-17] 16 (01/18 0434) BP: (90-108)/(49-59) 98/51 (01/18 0434) SpO2:  [88 %-98 %] 92 % (01/18 0434) Last BM Date: 03/12/21  Intake/Output from previous day: 01/17 0701 - 01/18 0700 In: 2613.7 [P.O.:1160; I.V.:407; IV Piggyback:1046.7] Out: 1500 [Urine:1500] Intake/Output this shift: No intake/output data recorded.  GI: Abd incision with honeycomb and some oozing,   Lab Results: CBC  Recent Labs    03/13/21 1401 03/14/21 0619  WBC 12.2* 10.3  HGB 7.7* 7.0*  HCT 22.7* 21.1*  PLT 152 132*    BMET Recent Labs    03/13/21 1401 03/14/21 0619  NA 123* 127*  K 4.6 4.3  CL 99 103  CO2 15* 18*  GLUCOSE 149* 158*  BUN 27* 26*  CREATININE 1.80* 1.20*  CALCIUM 8.5* 8.7*    PT/INR No results for input(s): LABPROT, INR in the last 72 hours. ABG No results for input(s): PHART, HCO3 in the last 72 hours.  Invalid input(s): PCO2, PO2  Anti-infectives: Anti-infectives (From admission, onward)    Start     Dose/Rate Route Frequency Ordered Stop   03/12/21 0600  cefoTEtan (CEFOTAN) 2 g in sodium chloride 0.9 % 100 mL IVPB        2 g 200 mL/hr over 30 Minutes Intravenous On call to O.R. 03/12/21 0508 03/12/21 0800       Medications: Scheduled Meds:  acetaminophen  650 mg Oral Q6H   atorvastatin  40 mg Oral Daily   citalopram  10 mg Oral Daily   docusate sodium  100 mg Oral BID   enoxaparin (LOVENOX) injection  30 mg Subcutaneous Q24H   feeding supplement  237 mL Oral BID BM   metoprolol succinate  25 mg Oral Daily   pantoprazole  40 mg Oral Daily   Continuous Infusions:  methocarbamol (ROBAXIN) IV     PRN  Meds:.HYDROmorphone (DILAUDID) injection, LORazepam, methocarbamol (ROBAXIN) IV, ondansetron (ZOFRAN) IV, oxyCODONE, oxyCODONE, phenol, prochlorperazine, simethicone  Assessment/Plan: Linda Glass is an 88 year old female who presented due to a colon polyp near the ileocecal valve.  She is status post laparoscopic, hand-assisted, right colectomy on 03/12/2021.  Advance diet as tolerated Stop IV fluids AKI - hold home diruetics - likely dehydrated from prep, surgery, diuretics, Cr improving this morning, difficult balance as she has had problems with fluid overload recently Hyperkalemia -improved this AM Acute blood loss anemia combined with dilution anemia -monitor and transfuse only if needed.  Recheck labs this afternoon. Pain and nausea medications as needed Increase activity Incentive spirometer    LOS: 2 days   FEN: Advance diet as tolerated ID: None VTE: Lovenox and SCDs Foley: Removed, voiding Dispo: Continued care on the floor    Felicie Morn, MD  [facility] Use [email or web address] to contact on call provider  Daily Billing: [phone] - post op

## 2021-03-14 NOTE — Progress Notes (Signed)
°   03/14/21 1400  Mobility  Activity Transferred to/from [facility]  Level of Assistance Contact guard assist, steadying assist  Assistive Device Front wheel walker  Distance Ambulated (ft) 10 ft  Activity Response Tolerated well  $Mobility charge 1 Mobility   Pt requested to use restroom. Attempted to get to bathroom but she felt she would not be able to make it. Brought BSC closer to pt. Pt assisted to and from [facility]. She stated she was to exhausted to ambulate in the hall. Left pt in chair with call bell at side and daughter present. Daughter stated she will try to walk with pt again this evening.  [place] Specialist Acute Rehab Services Office: [phone]

## 2021-03-15 ENCOUNTER — Other Ambulatory Visit (HOSPITAL_COMMUNITY): Payer: Self-pay

## 2021-03-15 LAB — CBC
HCT: 18.8 % — ABNORMAL LOW (ref 36.0–46.0)
Hemoglobin: 6.5 g/dL — CL (ref 12.0–15.0)
MCH: 35.7 pg — ABNORMAL HIGH (ref 26.0–34.0)
MCHC: 34.6 g/dL (ref 30.0–36.0)
MCV: 103.3 fL — ABNORMAL HIGH (ref 80.0–100.0)
Platelets: 108 10*3/uL — ABNORMAL LOW (ref 150–400)
RBC: 1.82 MIL/uL — ABNORMAL LOW (ref 3.87–5.11)
RDW: 14.3 % (ref 11.5–15.5)
WBC: 7 10*3/uL (ref 4.0–10.5)
nRBC: 0 % (ref 0.0–0.2)

## 2021-03-15 LAB — BASIC METABOLIC PANEL
Anion gap: 7 (ref 5–15)
BUN: 22 mg/dL (ref 8–23)
CO2: 20 mmol/L — ABNORMAL LOW (ref 22–32)
Calcium: 8.9 mg/dL (ref 8.9–10.3)
Chloride: 100 mmol/L (ref 98–111)
Creatinine, Ser: 0.72 mg/dL (ref 0.44–1.00)
GFR, Estimated: >60 mL/min (ref >60)
Glucose, Bld: 95 mg/dL (ref 70–99)
Potassium: 4.6 mmol/L (ref 3.5–5.1)
Sodium: 127 mmol/L — ABNORMAL LOW (ref 135–145)

## 2021-03-15 LAB — PREPARE RBC (CROSSMATCH)

## 2021-03-15 MED ORDER — SPIRONOLACTONE 12.5 MG HALF TABLET
12.5000 mg | ORAL_TABLET | Freq: Every day | ORAL | Status: DC
  Administered 2021-03-15 – 2021-03-16 (×2): 12.5 mg via ORAL
  Filled 2021-03-15 (×2): qty 1

## 2021-03-15 MED ORDER — SODIUM CHLORIDE 0.9% IV SOLUTION: Freq: Once | INTRAVENOUS | Status: DC

## 2021-03-15 MED ORDER — FUROSEMIDE 40 MG PO TABS
40.0000 mg | ORAL_TABLET | Freq: Every day | ORAL | Status: DC
  Administered 2021-03-15 – 2021-03-16 (×2): 40 mg via ORAL
  Filled 2021-03-15 (×2): qty 1

## 2021-03-15 NOTE — Progress Notes (Signed)
Progress Note: General Surgery Service   Chief Complaint/Subjective: Doing well. Lots of urine last night - up and down from bed.  Objective: Vital signs in last 24 hours: Temp:  [97.3 F (36.3 C)-98 F (36.7 C)] 97.3 F (36.3 C) (01/19 0508) Pulse Rate:  [64-77] 64 (01/19 0508) Resp:  [16] 16 (01/19 0508) BP: (120-145)/(55-79) 145/79 (01/19 0508) SpO2:  [94 %-97 %] 97 % (01/19 0508) Weight:  [79.1 kg] 79.1 kg (01/19 0508) Last BM Date: 03/13/21  Intake/Output from previous day: 01/18 0701 - 01/19 0700 In: 2280 [P.O.:2280] Out: 800 [Urine:800] Intake/Output this shift: No intake/output data recorded.  GI: Abd incision c/d/I, honeycomb removed.  Lab Results: CBC  Recent Labs    03/14/21 1248 03/15/21 0359  WBC 12.2* 7.0  HGB 7.3* 6.5*  HCT 22.0* 18.8*  PLT 136* 108*    BMET Recent Labs    03/14/21 1248 03/15/21 0359  NA 123* 127*  K 4.1 4.6  CL 99 100  CO2 19* 20*  GLUCOSE 133* 95  BUN 26* 22  CREATININE 1.13* 0.72  CALCIUM 8.7* 8.9    PT/INR No results for input(s): LABPROT, INR in the last 72 hours. ABG No results for input(s): PHART, HCO3 in the last 72 hours.  Invalid input(s): PCO2, PO2  Anti-infectives: Anti-infectives (From admission, onward)    Start     Dose/Rate Route Frequency Ordered Stop   03/12/21 0600  cefoTEtan (CEFOTAN) 2 g in sodium chloride 0.9 % 100 mL IVPB        2 g 200 mL/hr over 30 Minutes Intravenous On call to O.R. 03/12/21 0508 03/12/21 0800       Medications: Scheduled Meds:  sodium chloride   Intravenous Once   acetaminophen  650 mg Oral Q6H   atorvastatin  40 mg Oral Daily   citalopram  10 mg Oral Daily   docusate sodium  100 mg Oral BID   enoxaparin (LOVENOX) injection  30 mg Subcutaneous Q24H   feeding supplement  237 mL Oral BID BM   furosemide  40 mg Oral Daily   metoprolol succinate  25 mg Oral Daily   pantoprazole  40 mg Oral Daily   spironolactone  12.5 mg Oral Daily   Continuous Infusions:   methocarbamol (ROBAXIN) IV     PRN Meds:.HYDROmorphone (DILAUDID) injection, LORazepam, methocarbamol (ROBAXIN) IV, ondansetron (ZOFRAN) IV, oxyCODONE, oxyCODONE, phenol, prochlorperazine, simethicone  Assessment/Plan: Ms. Gittens is an 88 year old female who presented due to a colon polyp near the ileocecal valve.  She is status post laparoscopic, hand-assisted, right colectomy on 03/12/2021.  Diet as tolerated Stop IV fluids AKI - Improved, restart home diuretics Hyperkalemia -improved this AM Hyponatremia - improved this AM, restart home diuretics Acute blood loss anemia combined with dilution anemia -6.5 this AM, transfuse 1 unit and start diuretics Pain and nausea medications as needed Increase activity Incentive spirometer    LOS: 3 days   FEN: Reg diet ID: None VTE: Lovenox and SCDs Foley: Removed, voiding Dispo: Continued care on the floor    Felicie Morn, MD  [facility] Use [email or web address] to contact on call provider  Daily Billing: [phone] - post op

## 2021-03-15 NOTE — Progress Notes (Addendum)
HGB 6.5 Dr. Autumn Messing paged at 5.49 am, MD called and said that when attending Dr. Tobias Alexander round in am he will address that issue.

## 2021-03-15 NOTE — Care Management Important Message (Signed)
Important Message  Patient Details IM Letter given to the Patient Name: Linda Glass MRN: [id number] Date of Birth: 04-Oct-1933   Medicare Important Message Given:  Yes     Kerin Salen 03/15/2021, 11:59 AM

## 2021-03-15 NOTE — Progress Notes (Addendum)
°   03/15/21 0900  Mobility  Activity Contraindicated/medical hold   Pt HgB <7, hold mobility for today.  [facility] Office: [phone]

## 2021-03-16 ENCOUNTER — Other Ambulatory Visit (HOSPITAL_COMMUNITY): Payer: Self-pay

## 2021-03-16 LAB — CBC
HCT: 24.5 % — ABNORMAL LOW (ref 36.0–46.0)
Hemoglobin: 8.3 g/dL — ABNORMAL LOW (ref 12.0–15.0)
MCH: 34.2 pg — ABNORMAL HIGH (ref 26.0–34.0)
MCHC: 33.9 g/dL (ref 30.0–36.0)
MCV: 100.8 fL — ABNORMAL HIGH (ref 80.0–100.0)
Platelets: 127 10*3/uL — ABNORMAL LOW (ref 150–400)
RBC: 2.43 MIL/uL — ABNORMAL LOW (ref 3.87–5.11)
RDW: 15.5 % (ref 11.5–15.5)
WBC: 6.8 10*3/uL (ref 4.0–10.5)
nRBC: 0 % (ref 0.0–0.2)

## 2021-03-16 LAB — TYPE AND SCREEN
ABO/RH(D): O POS
Antibody Screen: NEGATIVE
Unit division: 0

## 2021-03-16 LAB — BASIC METABOLIC PANEL
Anion gap: 7 (ref 5–15)
BUN: 14 mg/dL (ref 8–23)
CO2: 20 mmol/L — ABNORMAL LOW (ref 22–32)
Calcium: 8.8 mg/dL — ABNORMAL LOW (ref 8.9–10.3)
Chloride: 104 mmol/L (ref 98–111)
Creatinine, Ser: 0.6 mg/dL (ref 0.44–1.00)
GFR, Estimated: >60 mL/min (ref >60)
Glucose, Bld: 96 mg/dL (ref 70–99)
Potassium: 3.7 mmol/L (ref 3.5–5.1)
Sodium: 131 mmol/L — ABNORMAL LOW (ref 135–145)

## 2021-03-16 LAB — BPAM RBC
Blood Product Expiration Date: 202302142359
ISSUE DATE / TIME: 202301191350
Unit Type and Rh: 5100

## 2021-03-16 MED ORDER — OXYCODONE-ACETAMINOPHEN 5-325 MG PO TABS
1.0000 | ORAL_TABLET | ORAL | 0 refills | Status: DC | PRN
  Filled 2021-03-16: qty 10, 3d supply, fill #0

## 2021-03-16 NOTE — Plan of Care (Signed)

## 2021-03-16 NOTE — Discharge Summary (Signed)
Patient ID: Linda Glass [id number] y.o. [birth year]  03/12/2021  Discharge date and time: 03/16/2021  Admitting Physician: [facility]  Discharge Physician: [facility]  Admission Diagnoses: Colonic mass [K63.89] Patient Active Problem List   Diagnosis Date Noted   Colonic mass 03/12/2021   Atherosclerosis of aorta ([place]) 01/05/2021   Multiple gastric ulcers    Cecum mass    2019 novel coronavirus disease (COVID-19) 05/02/2020   Persistent atrial fibrillation ([place]) 05/01/2020   Hypertension, essential, benign 07/05/2019   Anxiety disorder 07/05/2019   Insomnia disorder 08/21/2016   Class 1 obesity 08/21/2016   Hyperlipidemia, mixed 08/21/2016     Discharge Diagnoses: colon polyp Patient Active Problem List   Diagnosis Date Noted   Colonic mass 03/12/2021   Atherosclerosis of aorta ([place]) 01/05/2021   Multiple gastric ulcers    Cecum mass    2019 novel coronavirus disease (COVID-19) 05/02/2020   Persistent atrial fibrillation ([place]) 05/01/2020   Hypertension, essential, benign 07/05/2019   Anxiety disorder 07/05/2019   Insomnia disorder 08/21/2016   Class 1 obesity 08/21/2016   Hyperlipidemia, mixed 08/21/2016    Operations: Procedure(s): LAPAROSCOPIC HAND ASSISTED RIGHT COLECTOMY  Admission Condition: good  Discharged Condition: good  Indication for Admission: Colon polyp  Hospital Course: Ms. Hafford presented with a colon polyp and underwent right colectomy.  Final pathology demonstrated a benign polyp.  The prep caused dehydration, she is on two diuretics at home, and she has been dealing with chronic anemia.  During recovery she did have some acute kidney injury which improved prior to discharge and acute blood loss/dilutional anemia which improved with 1 uPRBC and diuresis.  Otherwise her recovery was uneventful in the hospital and she was discharged.  Consults: None  Significant Diagnostic Studies: None  Treatments:  surgery: as above  Disposition: Home  Patient Instructions:  Allergies as of 03/16/2021   No Known Allergies      Medication List     TAKE these medications    acetaminophen 500 MG tablet Commonly known as: TYLENOL Take 500 mg by mouth every 6 (six) hours as needed for moderate pain.   atorvastatin 40 MG tablet Commonly known as: LIPITOR TAKE 1 TABLET EACH DAY.   citalopram 10 MG tablet Commonly known as: CELEXA Take 1 tablet (10 mg total) by mouth daily.   Eliquis 5 MG Tabs tablet Generic drug: apixaban TAKE ONE TABLET BY MOUTH TWICE DAILY   Ferrous Gluconate 324 (37.5 Fe) MG Tabs Take 1 tablet by mouth daily.   furosemide 80 MG tablet Commonly known as: LASIX Take 1 tablet (80 mg total) by mouth daily.   LORazepam 0.5 MG tablet Commonly known as: ATIVAN TAKE 1/2 TABLET AT BEDTIME AS NEEDED FOR ANXIETY.   losartan 50 MG tablet Commonly known as: COZAAR TAKE ONE TABLET BY MOUTH ONCE DAILY   magnesium oxide 400 (241.3 Mg) MG tablet Commonly known as: MAG-OX Take 0.5 tablets (200 mg total) by mouth daily. What changed: how much to take   metoprolol succinate 25 MG 24 hr tablet Commonly known as: TOPROL-XL Take 1 tablet (25 mg total) by mouth daily.   multivitamin with minerals Tabs tablet Take 1 tablet by mouth daily.   oxyCODONE-acetaminophen 5-325 MG tablet Commonly known as: Percocet Take 1 tablet by mouth every 4 (four) hours as needed for severe pain.   pantoprazole 40 MG tablet Commonly known as: PROTONIX Take 1 tablet (40 mg total) by mouth daily.   spironolactone 25 MG tablet Commonly known  as: ALDACTONE Take 1 tablet (25 mg total) by mouth daily.   VITAMIN B 12 PO Take 500 mcg by mouth daily.        Activity: no heavy lifting for 4 weeks Diet: regular diet Wound Care: keep wound clean and dry  Follow-up:  With Dr. Thermon Leyland in 4 weeks.  Signed: Nickola Major Othell Jaime [facility] Bariat[facility]ally [facility] [place]   03/16/2021, 7:39 AM

## 2021-03-16 NOTE — Progress Notes (Signed)
Assessment unchanged. Pt and daughter verbalized understanding of dc instructions including meds to resume and follow up care. Understands when to call the doctor. Discharged via wc to front entrance accompanied by daughter and NT.

## 2021-03-19 ENCOUNTER — Other Ambulatory Visit (HOSPITAL_COMMUNITY): Payer: Self-pay

## 2021-03-20 ENCOUNTER — Other Ambulatory Visit: Payer: Self-pay | Admitting: Family Medicine

## 2021-03-20 DIAGNOSIS — R000 Tachycardia, unspecified: Secondary | ICD-10-CM

## 2021-03-20 DIAGNOSIS — I10 Essential (primary) hypertension: Secondary | ICD-10-CM

## 2021-03-26 ENCOUNTER — Ambulatory Visit (INDEPENDENT_AMBULATORY_CARE_PROVIDER_SITE_OTHER): Payer: Medicare Other

## 2021-03-26 VITALS — Ht 61.0 in | Wt 160.0 lb

## 2021-03-26 DIAGNOSIS — Z0000 Encounter for general adult medical examination without abnormal findings: Secondary | ICD-10-CM

## 2021-03-26 NOTE — Patient Instructions (Signed)
[name] , Thank you for taking time to come for your Medicare Wellness Visit. I appreciate your ongoing commitment to your health goals. Please review the following plan we discussed and let me know if I can assist you in the future.   Screening recommendations/referrals: Colonoscopy: not required Mammogram: not required Bone Density: completed 08/21/2016 Recommended yearly ophthalmology/optometry visit for glaucoma screening and checkup Recommended yearly dental visit for hygiene and checkup  Vaccinations: Influenza vaccine: completed 01/05/2021 Pneumococcal vaccine: completed 01/05/2021 Tdap vaccine: completed 08/21/2016, due 08/22/2026 Shingles vaccine: discussed   Covid-19: 02/22/2020, 05/27/2019, 05/06/2019  Advanced directives: Advance directive discussed with you today.   Conditions/risks identified: none  Next appointment: Follow up in one year for your annual wellness visit    Preventive Care 65 Years and Older, Female Preventive care refers to lifestyle choices and visits with your health care provider that can promote health and wellness. What does preventive care include? A yearly physical exam. This is also called an annual well check. Dental exams once or twice a year. Routine eye exams. Ask your health care provider how often you should have your eyes checked. Personal lifestyle choices, including: Daily care of your teeth and gums. Regular physical activity. Eating a healthy diet. Avoiding tobacco and drug use. Limiting alcohol use. Practicing safe sex. Taking low-dose aspirin every day. Taking vitamin and mineral supplements as recommended by your health care provider. What happens during an annual well check? The services and screenings done by your health care provider during your annual well check will depend on your age, overall health, lifestyle risk factors, and family history of disease. Counseling  Your health care provider may ask you questions about  your: Alcohol use. Tobacco use. Drug use. Emotional well-being. Home and relationship well-being. Sexual activity. Eating habits. History of falls. Memory and ability to understand (cognition). Work and work Statistician. Reproductive health. Screening  You may have the following tests or measurements: Height, weight, and BMI. Blood pressure. Lipid and cholesterol levels. These may be checked every 5 years, or more frequently if you are over 39 years old. Skin check. Lung cancer screening. You may have this screening every year starting at age 20 if you have a 30-pack-year history of smoking and currently smoke or have quit within the past 15 years. Fecal occult blood test (FOBT) of the stool. You may have this test every year starting at age 76. Flexible sigmoidoscopy or colonoscopy. You may have a sigmoidoscopy every 5 years or a colonoscopy every 10 years starting at age 70. Hepatitis C blood test. Hepatitis B blood test. Sexually transmitted disease (STD) testing. Diabetes screening. This is done by checking your blood sugar (glucose) after you have not eaten for a while (fasting). You may have this done every 1-3 years. Bone density scan. This is done to screen for osteoporosis. You may have this done starting at age 91. Mammogram. This may be done every 1-2 years. Talk to your health care provider about how often you should have regular mammograms. Talk with your health care provider about your test results, treatment options, and if necessary, the need for more tests. Vaccines  Your health care provider may recommend certain vaccines, such as: Influenza vaccine. This is recommended every year. Tetanus, diphtheria, and acellular pertussis (Tdap, Td) vaccine. You may need a Td booster every 10 years. Zoster vaccine. You may need this after age 56. Pneumococcal 13-valent conjugate (PCV13) vaccine. One dose is recommended after age 24. Pneumococcal polysaccharide (PPSV23) vaccine.  One  dose is recommended after age 16. Talk to your health care provider about which screenings and vaccines you need and how often you need them. This information is not intended to replace advice given to you by your health care provider. Make sure you discuss any questions you have with your health care provider. Document Released: 03/10/2015 Document Revised: 11/01/2015 Document Reviewed: 12/13/2014 Elsevier Interactive Patient Education  2017 [employer] Prevention in the Home Falls can cause injuries. They can happen to people of all ages. There are many things you can do to make your home safe and to help prevent falls. What can I do on the outside of my home? Regularly fix the edges of walkways and driveways and fix any cracks. Remove anything that might make you trip as you walk through a door, such as a raised step or threshold. Trim any bushes or trees on the path to your home. Use bright outdoor lighting. Clear any walking paths of anything that might make someone trip, such as rocks or tools. Regularly check to see if handrails are loose or broken. Make sure that both sides of any steps have handrails. Any raised decks and porches should have guardrails on the edges. Have any leaves, snow, or ice cleared regularly. Use sand or salt on walking paths during winter. Clean up any spills in your garage right away. This includes oil or grease spills. What can I do in the bathroom? Use night lights. Install grab bars by the toilet and in the tub and shower. Do not use towel bars as grab bars. Use non-skid mats or decals in the tub or shower. If you need to sit down in the shower, use a plastic, non-slip stool. Keep the floor dry. Clean up any water that spills on the floor as soon as it happens. Remove soap buildup in the tub or shower regularly. Attach bath mats securely with double-sided non-slip rug tape. Do not have throw rugs and other things on the floor that can make  you trip. What can I do in the bedroom? Use night lights. Make sure that you have a light by your bed that is easy to reach. Do not use any sheets or blankets that are too big for your bed. They should not hang down onto the floor. Have a firm chair that has side arms. You can use this for support while you get dressed. Do not have throw rugs and other things on the floor that can make you trip. What can I do in the kitchen? Clean up any spills right away. Avoid walking on wet floors. Keep items that you use a lot in easy-to-reach places. If you need to reach something above you, use a strong step stool that has a grab bar. Keep electrical cords out of the way. Do not use floor polish or wax that makes floors slippery. If you must use wax, use non-skid floor wax. Do not have throw rugs and other things on the floor that can make you trip. What can I do with my stairs? Do not leave any items on the stairs. Make sure that there are handrails on both sides of the stairs and use them. Fix handrails that are broken or loose. Make sure that handrails are as long as the stairways. Check any carpeting to make sure that it is firmly attached to the stairs. Fix any carpet that is loose or worn. Avoid having throw rugs at the top or bottom of the stairs.  If you do have throw rugs, attach them to the floor with carpet tape. Make sure that you have a light switch at the top of the stairs and the bottom of the stairs. If you do not have them, ask someone to add them for you. What else can I do to help prevent falls? Wear shoes that: Do not have high heels. Have rubber bottoms. Are comfortable and fit you well. Are closed at the toe. Do not wear sandals. If you use a stepladder: Make sure that it is fully opened. Do not climb a closed stepladder. Make sure that both sides of the stepladder are locked into place. Ask someone to hold it for you, if possible. Clearly mark and make sure that you can  see: Any grab bars or handrails. First and last steps. Where the edge of each step is. Use tools that help you move around (mobility aids) if they are needed. These include: Canes. Walkers. Scooters. Crutches. Turn on the lights when you go into a dark area. Replace any light bulbs as soon as they burn out. Set up your furniture so you have a clear path. Avoid moving your furniture around. If any of your floors are uneven, fix them. If there are any pets around you, be aware of where they are. Review your medicines with your doctor. Some medicines can make you feel dizzy. This can increase your chance of falling. Ask your doctor what other things that you can do to help prevent falls. This information is not intended to replace advice given to you by your health care provider. Make sure you discuss any questions you have with your health care provider. Document Released: 12/08/2008 Document Revised: 07/20/2015 Document Reviewed: 03/18/2014 Elsevier Interactive Patient Education  2017 [employer].

## 2021-03-26 NOTE — Progress Notes (Signed)
I connected with Linda Glass today by telephone and verified that I am speaking with the correct person using two identifiers. Location patient: home Location provider: work Persons participating in the virtual visit: Linda Glass, son in law, Glenna Durand LPN.   I discussed the limitations, risks, security and privacy concerns of performing an evaluation and management service by telephone and the availability of in person appointments. I also discussed with the patient that there may be a patient responsible charge related to this service. The patient expressed understanding and verbally consented to this telephonic visit.    Interactive audio and video telecommunications were attempted between this provider and patient, however failed, due to patient having technical difficulties OR patient did not have access to video capability.  We continued and completed visit with audio only.     Vital signs may be patient reported or missing.  Subjective:   Linda Glass is a 88 y.o. female who presents for Medicare Annual (Subsequent) preventive examination.  Review of Systems     Cardiac Risk Factors include: advanced age (>48men, >67 women);dyslipidemia;hypertension;obesity (BMI >30kg/m2)     Objective:    Today's Vitals   03/26/21 1027 03/26/21 1028  Weight: 160 lb (72.6 kg)   Height: 5\' 1"  (1.549 m)   PainSc:  2    Body mass index is 30.23 kg/m.  Advanced Directives 03/26/2021 03/12/2021 02/27/2021 05/01/2020 05/01/2020 08/21/2016  Does Patient Have a Medical Advance Directive? No No No No No Yes  Would patient like information on creating a medical advance directive? - Yes (MAU/Ambulatory/Procedural Areas - Information given) Yes (MAU/Ambulatory/Procedural Areas - Information given) No - Patient declined No - Patient declined -    Current Medications (verified) Outpatient Encounter Medications as of 03/26/2021  Medication Sig   acetaminophen (TYLENOL) 500 MG  tablet Take 500 mg by mouth every 6 (six) hours as needed for moderate pain.   atorvastatin (LIPITOR) 40 MG tablet TAKE 1 TABLET EACH DAY.   citalopram (CELEXA) 10 MG tablet Take 1 tablet (10 mg total) by mouth daily.   Cyanocobalamin (VITAMIN B 12 PO) Take 500 mcg by mouth daily.   ELIQUIS 5 MG TABS tablet TAKE ONE TABLET BY MOUTH TWICE DAILY   Ferrous Gluconate 324 (37.5 Fe) MG TABS Take 1 tablet by mouth daily.   furosemide (LASIX) 80 MG tablet Take 1 tablet (80 mg total) by mouth daily.   LORazepam (ATIVAN) 0.5 MG tablet TAKE 1/2 TABLET AT BEDTIME AS NEEDED FOR ANXIETY.   losartan (COZAAR) 50 MG tablet TAKE ONE TABLET BY MOUTH ONCE DAILY   magnesium oxide (MAG-OX) 400 (241.3 Mg) MG tablet Take 0.5 tablets (200 mg total) by mouth daily. (Patient taking differently: Take 400 mg by mouth daily.)   metoprolol succinate (TOPROL-XL) 25 MG 24 hr tablet TAKE ONE TABLET BY MOUTH ONCE DAILY   Multiple Vitamin (MULTIVITAMIN WITH MINERALS) TABS tablet Take 1 tablet by mouth daily.   oxyCODONE-acetaminophen (PERCOCET) 5-325 MG tablet Take 1 tablet by mouth every 4 hours as needed for severe pain.   pantoprazole (PROTONIX) 40 MG tablet Take 1 tablet (40 mg total) by mouth daily.   spironolactone (ALDACTONE) 25 MG tablet Take 1 tablet (25 mg total) by mouth daily.   No facility-administered encounter medications on file as of 03/26/2021.    Allergies (verified) Patient has no known allergies.   History: Past Medical History:  Diagnosis Date   Anemia 04/2020   Anxiety    Arthritis    Chicken pox  COVID-19    Heart murmur    Hyperlipidemia    Hypertension    Past Surgical History:  Procedure Laterality Date   ABDOMINAL HYSTERECTOMY  1983   BIOPSY  05/05/2020   Procedure: BIOPSY;  Surgeon: Mauri Pole, MD;  Location: [facility];  Service: Endoscopy;;  EGD and COLON   COLONOSCOPY WITH PROPOFOL N/A 05/05/2020   Procedure: COLONOSCOPY WITH PROPOFOL;  Surgeon: Mauri Pole, MD;   Location: [facility];  Service: Endoscopy;  Laterality: N/A;   ESOPHAGOGASTRODUODENOSCOPY (EGD) WITH PROPOFOL N/A 05/05/2020   Procedure: ESOPHAGOGASTRODUODENOSCOPY (EGD) WITH PROPOFOL;  Surgeon: Mauri Pole, MD;  Location: [facility];  Service: Endoscopy;  Laterality: N/A;   FRACTURE SURGERY     wrist fracture 2013   LAPAROSCOPIC PARTIAL COLECTOMY Right 03/12/2021   Procedure: LAPAROSCOPIC HAND ASSISTED RIGHT COLECTOMY;  Surgeon: Felicie Morn, MD;  Location: [facility];  Service: General;  Laterality: Right;   Family History  Problem Relation Age of Onset   Hyperlipidemia Mother    Diabetes Neg Hx    Social History   Socioeconomic History   Marital status: Widowed    Spouse name: Not on file   Number of children: 2   Years of education: Not on file   Highest education level: Not on file  Occupational History   Not on file  Tobacco Use   Smoking status: Never   Smokeless tobacco: Never  Vaping Use   Vaping Use: Never used  Substance and Sexual Activity   Alcohol use: Not Currently    Alcohol/week: 7.0 standard drinks    Types: 7 Glasses of wine per week    Comment: Occasional   Drug use: No   Sexual activity: Not Currently  Other Topics Concern   Not on file  Social History Narrative   Not on file   Social Determinants of Health   Financial Resource Strain: Low Risk    Difficulty of Paying Living Expenses: Not hard at all  Food Insecurity: No Food Insecurity   Worried About Charity fundraiser in the Last Year: Never true   Ran Out of Food in the Last Year: Never true  Transportation Needs: No Transportation Needs   Lack of Transportation (Medical): No   Lack of Transportation (Non-Medical): No  Physical Activity: Inactive   Days of Exercise per Week: 0 days   Minutes of Exercise per Session: 0 min  Stress: No Stress Concern Present   Feeling of Stress : Not at all  Social Connections: Not on file    Tobacco Counseling Counseling given: Not  Answered   Clinical Intake:  Pre-visit preparation completed: Yes  Pain : 0-10 Pain Score: 2  Pain Type: Chronic pain Pain Location: Generalized Pain Descriptors / Indicators: Aching Pain Onset: More than a month ago Pain Frequency: Constant     Nutritional Status: BMI > 30  Obese Nutritional Risks: None Diabetes: No  How often do you need to have someone help you when you read instructions, pamphlets, or other written materials from your doctor or pharmacy?: 1 - Never  Diabetic? no  Interpreter Needed?: No  Information entered by :: NAllen LPN   Activities of Daily Living In your present state of health, do you have any difficulty performing the following activities: 03/26/2021 03/21/2021  Hearing? Y N  Comment decreased hearing in right ear -  Vision? N N  Difficulty concentrating or making decisions? N N  Walking or climbing stairs? Y Y  Comment - -  Dressing or bathing? N N  Doing errands, shopping? Y N  Comment since surgery needs someone to drive -  Preparing Food and eating ? N N  Using the Toilet? N N  In the past six months, have you accidently leaked urine? N N  Do you have problems with loss of bowel control? N N  Managing your Medications? N N  Managing your Finances? N N  Housekeeping or managing your Housekeeping? Tempie Donning  Some recent data might be hidden    Patient Care Team: [name], [name], MD as PCP - General (Family Medicine) [name], MD as PCP - Cardiology (Cardiology) Viona Gilmore, [facility] as Pharmacist (Pharmacist)  Indicate any recent Medical Services you may have received from other than [facility] providers in the past year (date may be approximate).     Assessment:   This is a routine wellness examination for [place].  Hearing/Vision screen Vision Screening - Comments:: No regular eye exams,  Dietary issues and exercise activities discussed: Current Exercise Habits: The patient does not participate in regular exercise at  present   Goals Addressed             This Visit's Progress    Patient Stated       03/26/2021, wants to get better       Depression Screen PHQ 2/9 Scores 03/26/2021 01/05/2021 02/11/2020 01/06/2019 08/26/2017 08/21/2016  PHQ - 2 Score 0 0 0 0 0 0    Fall Risk Fall Risk  03/26/2021 03/21/2021 01/05/2021 02/11/2020 07/05/2019  Falls in the past year? 0 0 0 0 0  Number falls in past yr: - 0 0 0 0  Injury with Fall? - 0 0 0 0  Risk for fall due to : Impaired mobility;Impaired balance/gait;Medication side effect - Orthopedic patient;Impaired balance/gait Impaired balance/gait;Orthopedic patient -  Follow up Falls evaluation completed;Education provided;Falls prevention discussed - Education provided Education provided Education provided    FALL RISK PREVENTION PERTAINING TO THE HOME:  Any stairs in or around the home? Yes  If so, are there any without handrails? No  Home free of loose throw rugs in walkways, pet beds, electrical cords, etc? Yes  Adequate lighting in your home to reduce risk of falls? Yes   ASSISTIVE DEVICES UTILIZED TO PREVENT FALLS:  Life alert? No  Use of a cane, walker or w/c? Yes  Grab bars in the bathroom? Yes  Shower chair or bench in shower? Yes  Elevated toilet seat or a handicapped toilet? No   TIMED UP AND GO:  Was the test performed? No .      Cognitive Function:        Immunizations Immunization History  Administered Date(s) Administered   Fluad Quad(high Dose 65+) 02/11/2020, 01/05/2021   Influenza, High Dose Seasonal PF 03/18/2016, 01/06/2017, 03/09/2018   PFIZER Comirnaty(Gray Top)Covid-19 Tri-Sucrose Vaccine 02/22/2020   PFIZER(Purple Top)SARS-COV-2 Vaccination 05/06/2019, 05/27/2019   PNEUMOCOCCAL CONJUGATE-20 01/05/2021   Pneumococcal Polysaccharide-23 03/09/2018    TDAP status: Up to date  Flu Vaccine status: Up to date  Pneumococcal vaccine status: Up to date  Covid-19 vaccine status: Completed vaccines  Qualifies for  Shingles Vaccine? Yes   Zostavax completed No   Shingrix Completed?: No.    Education has been provided regarding the importance of this vaccine. Patient has been advised to call insurance company to determine out of pocket expense if they have not yet received this vaccine. Advised may also receive vaccine at local pharmacy or Health Dept. Verbalized acceptance  and understanding.  Screening Tests Health Maintenance  Topic Date Due   Zoster Vaccines- Shingrix (1 of 2) Never done   COVID-19 Vaccine (4 - Booster for Pfizer series) 04/18/2020   TETANUS/TDAP  08/22/2026   Pneumonia Vaccine 33+ Years old  Completed   INFLUENZA VACCINE  Completed   DEXA SCAN  Completed   HPV VACCINES  Aged Out    Health Maintenance  Health Maintenance Due  Topic Date Due   Zoster Vaccines- Shingrix (1 of 2) Never done   COVID-19 Vaccine (4 - Booster for Pfizer series) 04/18/2020    Colorectal cancer screening: No longer required.   Mammogram status: No longer required due to age.  Bone Density status: Completed 08/21/2016  Lung Cancer Screening: (Low Dose CT Chest recommended if Age 38-80 years, 30 pack-year currently smoking OR have quit w/in 15years.) does not qualify.   Lung Cancer Screening Referral: no  Additional Screening:  Hepatitis C Screening: does not qualify;   Vision Screening: Recommended annual ophthalmology exams for early detection of glaucoma and other disorders of the eye. Is the patient up to date with their annual eye exam?  No  Who is the provider or what is the name of the office in which the patient attends annual eye exams? none If pt is not established with a provider, would they like to be referred to a provider to establish care? No .   Dental Screening: Recommended annual dental exams for proper oral hygiene  Community Resource Referral / Chronic Care Management: CRR required this visit?  No   CCM required this visit?  No      Plan:     I have personally  reviewed and noted the following in the patients chart:   Medical and social history Use of alcohol, tobacco or illicit drugs  Current medications and supplements including opioid prescriptions.  Functional ability and status Nutritional status Physical activity Advanced directives List of other physicians Hospitalizations, surgeries, and ER visits in previous 12 months Vitals Screenings to include cognitive, depression, and falls Referrals and appointments  In addition, I have reviewed and discussed with patient certain preventive protocols, quality metrics, and best practice recommendations. A written personalized care plan for preventive services as well as general preventive health recommendations were provided to patient.     [name], LPN   [id number]   Nurse Notes: 6 CIT not administered. Patient appeared cognitive per conversation.

## 2021-04-10 ENCOUNTER — Ambulatory Visit (HOSPITAL_BASED_OUTPATIENT_CLINIC_OR_DEPARTMENT_OTHER): Payer: Medicare Other | Admitting: Cardiovascular Disease

## 2021-04-30 ENCOUNTER — Other Ambulatory Visit: Payer: Self-pay | Admitting: Family Medicine

## 2021-05-17 ENCOUNTER — Telehealth: Payer: Self-pay | Admitting: Pharmacist

## 2021-05-17 NOTE — Chronic Care Management (AMB) (Signed)
? ? ?Chronic Care Management ?Pharmacy Assistant  ? ?Name: Linda Glass  MRN: [id number] DOB: 09/19/1933 ? ?Reason for Encounter: Offer follow up with [name]  ?  ?Recent office visits:  ?03/26/21 [name], LPN - Patient presented for Medicare Annual Wellness Exam. No medication changes. ? ?01/05/21 [name], [name], MD - Patient presented for Hypomagnesemia and other concerns.No medication changes ? ?Recent consult visits:  ?04/05/21 [name], [name], MD - Patient presented for Post Op exam. No medication changes. ? ?02/27/21 [name], [name], MD - Patient presented for Pre- Admission Testing. No medication changes. ? ?01/05/21 [name], MD (Cardiology) - Patient presented for Persistent atrial fibrillation and other concerns. Prescribed Spironolactone. Increased Furosemide. Stopped Amlodipine and Potassium Chloride.  ? ?12/07/20 [name], [name], MD  - Patient presented for Tubular adenoma of colon. No medication changes. ? ?11/21/20 Patient presented to [facility] for CT Abdomen Pelvis W Contrast. ? ?Hospital visits:  ?Medication Reconciliation was completed by comparing discharge summary, patient?s EMR and Pharmacy list, and upon discussion with patient. ? ?Patient presented to [facility] on 03/02/21 due to Colonic Mass. Patient was present for 4 days. ? ?New?Medications Started at [facility] Discharge:?? ?-started  ?oxyCODONE-acetaminophen (Percocet) ? ?Medication Changes at Hospital Discharge: ?-Changed  ?None ? ?Medications Discontinued at Hospital Discharge: ?-Stopped  ?none ? ?Medications that remain the same after Hospital Discharge:??  ?-All other medications will remain the same.   ? ?Medications: ?Outpatient Encounter Medications as of 05/17/2021  ?Medication Sig  ? acetaminophen (TYLENOL) 500 MG tablet Take 500 mg by mouth every 6 (six) hours as needed for moderate pain.  ? atorvastatin (LIPITOR) 40 MG  tablet TAKE 1 TABLET EACH DAY.  ? citalopram (CELEXA) 10 MG tablet Take 1 tablet (10 mg total) by mouth daily.  ? Cyanocobalamin (VITAMIN B 12 PO) Take 500 mcg by mouth daily.  ? ELIQUIS 5 MG TABS tablet TAKE ONE TABLET BY MOUTH TWICE DAILY  ? Ferrous Gluconate 324 (37.5 Fe) MG TABS Take 1 tablet by mouth daily.  ? furosemide (LASIX) 80 MG tablet Take 1 tablet (80 mg total) by mouth daily.  ? LORazepam (ATIVAN) 0.5 MG tablet TAKE 1/2 TABLET AT BEDTIME AS NEEDED FOR ANXIETY.  ? losartan (COZAAR) 50 MG tablet TAKE ONE TABLET BY MOUTH ONCE DAILY  ? magnesium oxide (MAG-OX) 400 (241.3 Mg) MG tablet Take 0.5 tablets (200 mg total) by mouth daily. (Patient taking differently: Take 400 mg by mouth daily.)  ? metoprolol succinate (TOPROL-XL) 25 MG 24 hr tablet TAKE ONE TABLET BY MOUTH ONCE DAILY  ? Multiple Vitamin (MULTIVITAMIN WITH MINERALS) TABS tablet Take 1 tablet by mouth daily.  ? oxyCODONE-acetaminophen (PERCOCET) 5-325 MG tablet Take 1 tablet by mouth every 4 hours as needed for severe pain.  ? pantoprazole (PROTONIX) 40 MG tablet Take 1 tablet (40 mg total) by mouth daily.  ? spironolactone (ALDACTONE) 25 MG tablet Take 1 tablet (25 mg total) by mouth daily.  ? ?No facility-administered encounter medications on file as of 05/17/2021.  ?Notes: ?Per MP call to patient to offer follow up appointment. Patient accepted she reports she has been doing well and has no concerns or questions at this time. ? ?Care Gaps: ?BP- 149/89 (hosp 03/12/21) ?Zoster Vaccine - Overdue ?COVID Booster - Overdue ?CCM- 6/23 ?AWV- 1/23 ? ?Star Rating Drugs: ?Atorvastatin '40mg'$  - Last filled 02/01/21 90 DS at [facility] (Verified as accurate) ?Losartan '50mg'$  - Last filled 03/05/21 90 DS [facility] ? ? ?Ned Clines CMA ?Clinical Pharmacist Assistant ?[phone] ? ?

## 2021-05-30 ENCOUNTER — Other Ambulatory Visit: Payer: Self-pay | Admitting: Family Medicine

## 2021-05-30 DIAGNOSIS — E782 Mixed hyperlipidemia: Secondary | ICD-10-CM

## 2021-05-30 NOTE — Telephone Encounter (Signed)
-   last filled 04/03/21 ?

## 2021-07-09 ENCOUNTER — Ambulatory Visit (HOSPITAL_BASED_OUTPATIENT_CLINIC_OR_DEPARTMENT_OTHER): Payer: Medicare Other | Admitting: Cardiovascular Disease

## 2021-07-30 ENCOUNTER — Telehealth: Payer: Medicare Other

## 2021-08-09 ENCOUNTER — Telehealth: Payer: Self-pay | Admitting: Pharmacist

## 2021-08-09 NOTE — Chronic Care Management (AMB) (Signed)
    Chronic Care Management Pharmacy Assistant   Name: Biannca Scantlin  MRN: [id number] DOB: 03/22/1933  Reason for Encounter: Change appointment time to 30 min slot on 08/21/21   Recent office visits:  None   Recent consult visits:  06/02/21 Daria Pastures, PA  - Patient presented for Cerumen Impaction Advised to use debrox drops.  Hospital visits:  Medication Reconciliation was completed by comparing discharge summary, patient's EMR and Pharmacy list, and upon discussion with patient.   Patient presented to [facility] on 03/02/21 due to Colonic Mass. Patient was present for 4 days.   New?Medications Started at [facility] Discharge:?? -started  oxyCODONE-acetaminophen (Percocet)   Medication Changes at Hospital Discharge: -Changed  None   Medications Discontinued at Hospital Discharge: -Stopped  none   Medications that remain the same after Hospital Discharge:??  -All other medications will remain the same.      Medications: Outpatient Encounter Medications as of 08/09/2021  Medication Sig   acetaminophen (TYLENOL) 500 MG tablet Take 500 mg by mouth every 6 (six) hours as needed for moderate pain.   atorvastatin (LIPITOR) 40 MG tablet TAKE 1 TABLET EACH DAY.   citalopram (CELEXA) 10 MG tablet Take 1 tablet (10 mg total) by mouth daily.   Cyanocobalamin (VITAMIN B 12 PO) Take 500 mcg by mouth daily.   ELIQUIS 5 MG TABS tablet TAKE ONE TABLET BY MOUTH TWICE DAILY   Ferrous Gluconate 324 (37.5 Fe) MG TABS Take 1 tablet by mouth daily.   furosemide (LASIX) 80 MG tablet Take 1 tablet (80 mg total) by mouth daily.   LORazepam (ATIVAN) 0.5 MG tablet TAKE 1/2 TABLET AT BEDTIME AS NEEDED FOR ANXIETY.   losartan (COZAAR) 50 MG tablet TAKE ONE TABLET BY MOUTH ONCE DAILY   magnesium oxide (MAG-OX) 400 (241.3 Mg) MG tablet Take 0.5 tablets (200 mg total) by mouth daily. (Patient taking differently: Take 400 mg by mouth daily.)   metoprolol succinate  (TOPROL-XL) 25 MG 24 hr tablet TAKE ONE TABLET BY MOUTH ONCE DAILY   Multiple Vitamin (MULTIVITAMIN WITH MINERALS) TABS tablet Take 1 tablet by mouth daily.   oxyCODONE-acetaminophen (PERCOCET) 5-325 MG tablet Take 1 tablet by mouth every 4 hours as needed for severe pain.   pantoprazole (PROTONIX) 40 MG tablet Take 1 tablet (40 mg total) by mouth daily.   spironolactone (ALDACTONE) 25 MG tablet Take 1 tablet (25 mg total) by mouth daily.   No facility-administered encounter medications on file as of 08/09/2021.   Notes: Call to patient to offer a different time slot for her appointment on 08/21/21 with [facility] Pharmacist patient aware and ok with change.  Care Gaps: BP- 149/89 (hosp 03/12/21) Zoster Vaccine - Overdue COVID Booster - Overdue CCM- 6/23 AWV- 1/23  Star Rating Drugs: Atorvastatin '40mg'$  - Last filled 05/30/21 90 DS at [facility]  Losartan '50mg'$  - Last filled 06/05/21 90 DS at [facility] Pharmacist Assistant [phone]

## 2021-08-15 ENCOUNTER — Other Ambulatory Visit: Payer: Self-pay | Admitting: Family Medicine

## 2021-08-15 DIAGNOSIS — F419 Anxiety disorder, unspecified: Secondary | ICD-10-CM

## 2021-08-20 ENCOUNTER — Telehealth: Payer: Self-pay | Admitting: Pharmacist

## 2021-08-20 ENCOUNTER — Telehealth: Payer: Self-pay

## 2021-08-20 DIAGNOSIS — I10 Essential (primary) hypertension: Secondary | ICD-10-CM

## 2021-08-21 ENCOUNTER — Ambulatory Visit (INDEPENDENT_AMBULATORY_CARE_PROVIDER_SITE_OTHER): Payer: Medicare Other | Admitting: Pharmacist

## 2021-08-21 DIAGNOSIS — I10 Essential (primary) hypertension: Secondary | ICD-10-CM

## 2021-08-21 DIAGNOSIS — G4700 Insomnia, unspecified: Secondary | ICD-10-CM

## 2021-08-21 NOTE — Progress Notes (Signed)
Chronic Care Management Pharmacy Note  08/21/2021 Name:  Linda Glass MRN:  [id number] DOB:  June 17, 1933  Summary: BP mostly at goal < 140/90 per home and office readings LDL at goal < 70 Pt reports swelling has significantly improved  Recommendations/Changes made from today's visit: -Recommended continued BP monitoring at home -Recommend repeat lipid panel  Plan: Scheduled PCP visit for August BP assessment in 3-4 months   Subjective: Linda Glass is an 88 y.o. year old female who is a primary patient of [name], Malka So, MD.  The CCM team was consulted for assistance with disease management and care coordination needs.    Engaged with patient by telephone for follow up visit in response to provider referral for pharmacy case management and/or care coordination services.   Consent to Services:  The patient was given information about Chronic Care Management services, agreed to services, and gave verbal consent prior to initiation of services.  Please see initial visit note for detailed documentation.   Patient Care Team: [name], [name], MD as PCP - General (Family Medicine) [name], MD as PCP - Cardiology (Cardiology) Viona Gilmore, [facility] as Pharmacist (Pharmacist)  Recent office visits: 03/26/21 [name], LPN - Patient presented for Medicare Annual Wellness Exam. No medication changes.   01/05/21 [name], [name], MD - Patient presented for Hypomagnesemia and other concerns.No medication changes.  Recent consult visits: 06/02/21 Peri Jefferson, PA [facility]): Patient presented for bilateral impacted cerumen. Removed cerumen.  04/05/21 [name], [name], MD - Patient presented for Post Op exam. No medication changes.   02/27/21 [name], [name], MD - Patient presented for Pre- Admission Testing. No medication changes.   01/05/21 [name], MD (Cardiology) - Patient presented for Persistent  atrial fibrillation and other concerns. Prescribed Spironolactone. Increased Furosemide. Stopped Amlodipine and Potassium Chloride.   Hospital visits: Medication Reconciliation was completed by comparing discharge summary, patient's EMR and Pharmacy list, and upon discussion with patient.   Patient presented to [facility] on 03/02/21 due to Colonic Mass. Patient was present for 4 days.   New?Medications Started at [facility] Discharge:?? -started  oxyCODONE-acetaminophen (Percocet)   Medication Changes at Hospital Discharge: -Changed  None   Medications Discontinued at Hospital Discharge: -Stopped  none   Medications that remain the same after Hospital Discharge:??  -All other medications will remain the same.     Objective:  Lab Results  Component Value Date   CREATININE 0.60 03/16/2021   BUN 14 03/16/2021   GFR 80.07 11/14/2020   GFRNONAA >60 03/16/2021   GFRAA 101 02/11/2020   NA 131 (L) 03/16/2021   K 3.7 03/16/2021   CALCIUM 8.8 (L) 03/16/2021   CO2 20 (L) 03/16/2021   GLUCOSE 96 03/16/2021    Lab Results  Component Value Date/Time   HGBA1C 5.9 (H) 02/27/2021 11:22 AM   GFR 80.07 11/14/2020 10:37 AM   GFR 72.91 05/31/2020 03:12 PM    Last diabetic Eye exam: No results found for: "HMDIABEYEEXA"  Last diabetic Foot exam: No results found for: "HMDIABFOOTEX"   Lab Results  Component Value Date   CHOL 122 02/11/2020   HDL 63 02/11/2020   LDLCALC 36 02/11/2020   LDLDIRECT 120.0 08/26/2017   TRIG 145 02/11/2020   CHOLHDL 1.9 02/11/2020       Latest Ref Rng & Units 11/14/2020   10:37 AM 05/06/2020    4:26 AM 05/05/2020    3:51 AM  Hepatic Function  Total Protein 6.0 -  8.3 g/dL 7.7  5.7  6.0   Albumin 3.5 - 5.2 g/dL 4.2  2.8  3.0   AST 0 - 37 U/L 35  19  20   ALT 0 - 35 U/L '20  14  15   ' Alk Phosphatase 39 - 117 U/L 154  87  93   Total Bilirubin 0.2 - 1.2 mg/dL 1.6  1.5  2.1     Lab Results  Component Value Date/Time   TSH 2.89  05/01/2020 03:00 PM   TSH 1.34 02/11/2020 11:03 AM       Latest Ref Rng & Units 03/16/2021    4:06 AM 03/15/2021    3:59 AM 03/14/2021   12:48 PM  CBC  WBC 4.0 - 10.5 K/uL 6.8  7.0  12.2   Hemoglobin 12.0 - 15.0 g/dL 8.3  6.5  7.3   Hematocrit 36.0 - 46.0 % 24.5  18.8  22.0   Platelets 150 - 400 K/uL 127  108  136     No results found for: "VD25OH"  Clinical ASCVD: Yes  The ASCVD Risk score (Arnett DK, et al., 2019) failed to calculate for the following reasons:   The 2019 ASCVD risk score is only valid for ages 13 to 54       03/26/2021   10:35 AM 01/05/2021   11:44 PM 02/11/2020   10:12 AM  Depression screen PHQ 2/9  Decreased Interest 0 0 0  Down, Depressed, Hopeless 0 0 0  PHQ - 2 Score 0 0 0     CHA2DS2/VAS Stroke Risk Points  Current as of 7 minutes ago     5 >= 2 Points: High Risk  1 - 1.99 Points: Medium Risk  0 Points: Low Risk    Last Change:       Details    This score determines the patient's risk of having a stroke if the  patient has atrial fibrillation.       Points Metrics  1 Has Congestive Heart Failure:  Yes    Current as of 7 minutes ago  0 Has Vascular Disease:  No    Current as of 7 minutes ago  1 Has Hypertension:  Yes    Current as of 7 minutes ago  2 Age:  25    Current as of 7 minutes ago  0 Has Diabetes:  No    Current as of 7 minutes ago  0 Had Stroke:  No  Had TIA:  No  Had Thromboembolism:  No    Current as of 7 minutes ago  1 Female:  Yes    Current as of 7 minutes ago      Social History   Tobacco Use  Smoking Status Never  Smokeless Tobacco Never   BP Readings from Last 3 Encounters:  03/16/21 (!) 149/89  02/27/21 (!) 160/88  01/05/21 (!) 146/78   Pulse Readings from Last 3 Encounters:  03/16/21 (!) 58  02/27/21 96  01/05/21 (!) 56   Wt Readings from Last 3 Encounters:  03/26/21 160 lb (72.6 kg)  03/16/21 167 lb 8.8 oz (76 kg)  02/27/21 157 lb (71.2 kg)   BMI Readings from Last 3 Encounters:  03/26/21  30.23 kg/m  03/16/21 31.66 kg/m  02/27/21 29.66 kg/m    Assessment/Interventions: Review of patient past medical history, allergies, medications, health status, including review of consultants reports, laboratory and other test data, was performed as part of comprehensive evaluation and provision of chronic care management  services.   SDOH:  (Social Determinants of Health) assessments and interventions performed: Yes   SDOH Screenings   Alcohol Screen: Not on file  Depression (PHQ2-9): Low Risk  (03/26/2021)   Depression (PHQ2-9)    PHQ-2 Score: 0  Financial Resource Strain: Low Risk  (03/26/2021)   Overall Financial Resource Strain (CARDIA)    Difficulty of Paying Living Expenses: Not hard at all  Food Insecurity: No Food Insecurity (03/26/2021)   Hunger Vital Sign    Worried About Running Out of Food in the Last Year: Never true    Ran Out of Food in the Last Year: Never true  Housing: Not on file  Physical Activity: Inactive (03/26/2021)   Exercise Vital Sign    Days of Exercise per Week: 0 days    Minutes of Exercise per Session: 0 min  Social Connections: Not on file  Stress: No Stress Concern Present (03/26/2021)   [place]    Feeling of Stress : Not at all  Tobacco Use: Low Risk  (03/26/2021)   Patient History    Smoking Tobacco Use: Never    Smokeless Tobacco Use: Never    Passive Exposure: Not on file  Transportation Needs: No Transportation Needs (03/26/2021)   PRAPARE - Transportation    Lack of Transportation (Medical): No    Lack of Transportation (Non-Medical): No    CCM Care Plan  No Known Allergies  Medications Reviewed Today     Reviewed by Viona Gilmore, [facility] (Pharmacist) on 08/21/21 at 1439  Med List Status: <None>   Medication Order Taking? Sig Documenting Provider Last Dose Status Informant  acetaminophen (TYLENOL) 500 MG tablet [id number] Yes Take 500 mg by mouth every 6 (six)  hours as needed for moderate pain. [provider] Taking Active Family Member  atorvastatin (LIPITOR) 40 MG tablet [id number] Yes TAKE 1 TABLET EACH DAY. [name], [name], MD Taking Active   citalopram (CELEXA) 10 MG tablet [id number] Yes TAKE ONE TABLET BY MOUTH ONCE DAILY [name], [name], MD Taking Active   Cyanocobalamin (VITAMIN B 12 PO) [id number] Yes Take 500 mcg by mouth daily. [provider] Taking Active Family Member  ELIQUIS 5 MG TABS tablet [id number] Yes TAKE ONE TABLET BY MOUTH TWICE DAILY [name], [name], MD Taking Active Family Member  Ferrous Gluconate 324 (37.5 Fe) MG TABS [id number] Yes Take 1 tablet by mouth daily. [provider] Taking Active Family Member  furosemide (LASIX) 80 MG tablet [id number] Yes Take 1 tablet (80 mg total) by mouth daily. [name], MD Taking Active Family Member  LORazepam (ATIVAN) 0.5 MG tablet [id number] Yes TAKE 1/2 TABLET AT BEDTIME AS NEEDED FOR ANXIETY. [name], [name], MD Taking Active   losartan (COZAAR) 50 MG tablet [id number] Yes TAKE ONE TABLET BY MOUTH ONCE DAILY [name], [name], MD Taking Active   magnesium oxide (MAG-OX) 400 (241.3 Mg) MG tablet [id number] Yes Take 0.5 tablets (200 mg total) by mouth daily.  Patient taking differently: Take 400 mg by mouth daily.   [place] Eric J, DO Taking Active Family Member  metoprolol succinate (TOPROL-XL) 25 MG 24 hr tablet [id number] Yes TAKE ONE TABLET BY MOUTH ONCE DAILY [name], [name], MD Taking Active   Multiple Vitamin (MULTIVITAMIN WITH MINERALS) TABS tablet [id number] Yes Take 1 tablet by mouth daily. [provider] Taking Active Family Member  pantoprazole (PROTONIX) 40 MG tablet [id number] Yes Take 1 tablet (40 mg total) by mouth daily. [name],  Malka So, MD Taking Active   spironolactone (ALDACTONE) 25 MG tablet [id number]  Take 1 tablet (25 mg total) by mouth daily. [name], MD  Expired 04/05/21 2359 Family Member            Patient Active  Problem List   Diagnosis Date Noted   Colonic mass 03/12/2021   Atherosclerosis of aorta ([place]) 01/05/2021   Multiple gastric ulcers    Cecum mass    2019 novel coronavirus disease (COVID-19) 05/02/2020   Persistent atrial fibrillation ([place]) 05/01/2020   Hypertension, essential, benign 07/05/2019   Anxiety disorder 07/05/2019   Insomnia disorder 08/21/2016   Class 1 obesity 08/21/2016   Hyperlipidemia, mixed 08/21/2016    Immunization History  Administered Date(s) Administered   Fluad Quad(high Dose 65+) 02/11/2020, 01/05/2021   Influenza, High Dose Seasonal PF 03/18/2016, 01/06/2017, 03/09/2018   PFIZER Comirnaty(Gray Top)Covid-19 Tri-Sucrose Vaccine 02/22/2020   PFIZER(Purple Top)SARS-COV-2 Vaccination 05/06/2019, 05/27/2019   PNEUMOCOCCAL CONJUGATE-20 01/05/2021   Pneumococcal Polysaccharide-23 03/09/2018   Patient reports her biggest complaint with her medications is the constipation she has with taking the iron tablets. She does take them after dinner and said it is doable right now.  Patient denies feeling dizzy or lightheaded. She is going to Dr. Lavone Nian office this week as well. She reports with the change from amlodipine to spironolactone she has not had swelling and no shortness of breath. She also denies headaches or chest pain.  Conditions to be addressed/monitored:  Hypertension, Hyperlipidemia, Atrial Fibrillation, Anxiety, and GI bleed  Conditions addressed this visit: Hypertension, Afib, Insomnia  Care Plan : [place]  Updates made by Viona Gilmore, [place] since 08/21/2021 12:00 AM     Problem: Problem: Hypertension, Hyperlipidemia, Atrial Fibrillation, Anxiety, and GI bleed      Long-Range Goal: Patient-Specific Goal   Start Date: 10/05/2020  Expected End Date: 10/05/2021  Recent Progress: On track  Priority: High  Note:   Current Barriers:  Unable to independently monitor therapeutic efficacy Unable to maintain control of blood  pressure  Pharmacist Clinical Goal(s):  Patient will achieve adherence to monitoring guidelines and medication adherence to achieve therapeutic efficacy maintain control of blood pressure as evidenced by home blood pressure readings  through collaboration with PharmD and provider.   Interventions: 1:1 collaboration with [name], [name], MD regarding development and update of comprehensive plan of care as evidenced by provider attestation and co-signature Inter-disciplinary care team collaboration (see longitudinal plan of care) Comprehensive medication review performed; medication list updated in electronic medical record  Hypertension (BP goal <140/90) -Not ideally controlled -Current treatment: Metoprolol succinate 25 mg 1 tablet daily - in AM - Appropriate, Effective, Safe, Accessible Losartan 50 mg 1 tablet daily - in AM - Appropriate, Query effective, Safe, Accessible Spironolactone 25 mg 1 tablet daily - Appropriate, Query effective, Safe, Accessible -Medications previously tried: amlodipine (swelling) -Current home readings: 130/58, 148/71 - checking every other day; typically 140s/70-80s on the bottom  -Current dietary habits: uses lite salt and low sodium options; only eats out with company or on a lunch date -Current exercise habits: no structured exercise -Denies hypotensive/hypertensive symptoms -Educated on BP goals and benefits of medications for prevention of heart attack, stroke and kidney damage; Exercise goal of 150 minutes per week; Importance of home blood pressure monitoring; Proper BP monitoring technique; -Counseled to monitor BP at home weekly, document, and provide log at future appointments -Counseled on diet and exercise extensively Recommended to continue current medication  Hyperlipidemia: (LDL goal <  70) -Controlled -Current treatment: Atorvastatin 40 mg 1 tablet daily - Appropriate, Effective, Safe, Accessible -Medications previously tried: none   -Current dietary patterns: does not eat fried foods; sautes foods with canola or olive oil -Current exercise habits: no structured exercise -Educated on Cholesterol goals;  Benefits of statin for ASCVD risk reduction; Importance of limiting foods high in cholesterol; Exercise goal of 150 minutes per week; -Counseled on diet and exercise extensively Recommended to continue current medication  Atrial Fibrillation (Goal: prevent stroke and major bleeding) -Controlled -CHADSVASC: 5 -Current treatment: Rate control: metoprolol succinate 25 mg 1 tablet daily - Appropriate, Effective, Safe, Accessible Anticoagulation: Eliquis 5 mg 1 tablet twice daily - Appropriate, Effective, Safe, Accessible -Medications previously tried: none -Home BP and HR readings: 131/68, 147/74; HR 77, 82 -Counseled on importance of adherence to anticoagulant exactly as prescribed; avoidance of NSAIDs due to increased bleeding risk with anticoagulants; -Recommended to continue current medication  Edema (Goal: manage symptoms and prevent exacerbations) -Controlled -Last ejection fraction: 60-65% (Date: 05/02/20) -Current treatment: Furosemide 80 mg 1 tablet daily - Appropriate, Effective, Safe, Accessible -Medications previously tried: none  -Current home BP/HR readings: refer to above -Current dietary habits: limits salt intake -Current exercise habits: no structured exercise -Educated on Proper diuretic administration and potassium supplementation Importance of blood pressure control -Recommended to continue current medication Counseled on using compression stockings and elevating her feet.  Anxiety/insomnia (Goal: improve quality and quantity of sleep) -Controlled -Current treatment: Citalopram 10 mg daily - Appropriate, Effective, Safe, Accessible Lorazepam 0.5 mg 1/2 tablet by mouth as needed (takes 1/2 tablet every night) - Appropriate, Effective, Query Safe, Accessible -Medications previously  tried/failed: none -PHQ9: 0 -GAD7: n/a -Educated on Benefits of medication for symptom control -Recommended to continue current medication  Ulcers/GI bleed  (Goal: prevent stomach bleeding) -Controlled -Current treatment  Ferrous gluconate 324 mg every  day - Appropriate, Effective, Safe, Accessible Pantoprazole 40 mg 1 tablet daily - Appropriate, Effective, Query Safe, Accessible -Medications previously tried: none  -Recommended to continue current medication  Health Maintenance -Vaccine gaps: shingrix, COVID booster -Current therapy:  Multivitamin 1 tablet daily Magnesium oxide 400 mg 1 tablet daily Vitamin B12 500 mcg daily  -Educated on Cost vs benefit of each product must be carefully weighed by individual consumer -Patient is satisfied with current therapy and denies issues -Recommended to continue current medication  Patient Goals/Self-Care Activities Patient will:  - take medications as prescribed check blood pressure a few times a week, document, and provide at future appointments target a minimum of 150 minutes of moderate intensity exercise weekly  Follow Up Plan: Telephone follow up appointment with care management team member scheduled for: 1 year      Medication Assistance: None required.  Patient affirms current coverage meets needs.  Compliance/Adherence/Medication fill history: Care Gaps: Shingrix, COVID booster Last BP: 149/89 (03/16/21)  Star-Rating Drugs: Atorvastatin 2m - [id number]: [phone]: [phone] Uses pill box? Yes Pt endorses 100% compliance  We discussed: Current pharmacy is preferred with insurance plan and patient is satisfied with pharmacy services Patient decided to:  Continue current medication management strategy  Care Plan and Follow Up Patient Decision:  Patient agrees to Care Plan and Follow-up.  Plan: Telephone  follow up appointment with care management team member scheduled for:  1 year  [name], PharmD, [place] Pharmacist [employer] at [place] [phone]

## 2021-08-23 ENCOUNTER — Ambulatory Visit (HOSPITAL_BASED_OUTPATIENT_CLINIC_OR_DEPARTMENT_OTHER): Payer: Medicare Other | Admitting: Family

## 2021-08-23 ENCOUNTER — Encounter (HOSPITAL_BASED_OUTPATIENT_CLINIC_OR_DEPARTMENT_OTHER): Payer: Self-pay | Admitting: Family

## 2021-08-23 VITALS — BP 142/92 | HR 69 | Ht 61.0 in | Wt 156.0 lb

## 2021-08-23 DIAGNOSIS — I4821 Permanent atrial fibrillation: Secondary | ICD-10-CM

## 2021-08-23 DIAGNOSIS — I10 Essential (primary) hypertension: Secondary | ICD-10-CM | POA: Diagnosis not present

## 2021-08-23 DIAGNOSIS — D6859 Other primary thrombophilia: Secondary | ICD-10-CM | POA: Diagnosis not present

## 2021-08-23 MED ORDER — LOSARTAN POTASSIUM 100 MG PO TABS: 100.0000 mg | ORAL_TABLET | Freq: Every day | ORAL | 3 refills | Status: DC

## 2021-08-23 NOTE — Patient Instructions (Addendum)
Medication Instructions:  Your physician has recommended you make the following change in your medication:   INCREASE Losartan to '100mg'$  daily   *If you need a refill on your cardiac medications before your next appointment, please call your pharmacy*   Lab Work: Your physician recommends that you return for lab work before you see Dr. [name] for BMP, CBC  Please return for Lab work. You may come to the...   [facility] (3rd floor) [address], [place], [place] [address]  Open: 8am-Noon and 1pm-4:30pm  Please ring the doorbell on the small table when you exit the elevator and the Lab Tech will come get you  [place] [facility] [address] [place], [place], [place] [address] Open: 8am-1pm, then 2pm-4:30pm   [place]- Please see attached locations sheet stapled to your lab work with address and hours.     If you have labs (blood work) drawn today and your tests are completely normal, you will receive your results only by: [place] (if you have MyChart) OR A paper copy in the mail If you have any lab test that is abnormal or we need to change your treatment, we will call you to review the results.   Testing/Procedures: Your EKG today shows atrial fibrillation which is a stable finding.    Follow-Up: At [facility], you and your health needs are our priority.  As part of our continuing mission to provide you with exceptional heart care, we have created designated Provider Care Teams.  These Care Teams include your primary Cardiologist (physician) and Advanced Practice Providers (APPs -  Physician Assistants and Nurse Practitioners) who all work together to provide you with the care you need, when you need it.  We recommend signing up for the patient portal called "MyChart".  Sign up information is provided on this After Visit Summary.  MyChart is used to connect with patients for Virtual Visits (Telemedicine).  Patients are  able to view lab/test results, encounter notes, upcoming appointments, etc.  Non-urgent messages can be sent to your provider as well.   To learn more about what you can do with MyChart, go to [email or web address].    Your next appointment:   4-6 month(s)  The format for your next appointment:   In Person  Provider:   [name], MD or Loel Dubonnet, NP     Other Instructions  Heart Healthy Diet Recommendations: A low-salt diet is recommended. Meats should be grilled, baked, or boiled. Avoid fried foods. Focus on lean protein sources like fish or chicken with vegetables and fruits. The American Heart Association is a Microbiologist!  American Heart Association Diet and Lifeystyle Recommendations   Exercise recommendations: The American Heart Association recommends 150 minutes of moderate intensity exercise weekly. Try 30 minutes of moderate intensity exercise 4-5 times per week. This could include walking, jogging, or swimming.  Important Information About Sugar

## 2021-08-23 NOTE — Progress Notes (Unsigned)
Office Visit    Patient Name: Linda Glass Date of Encounter: 08/23/2021  PCP:  [name], [name], [place]  Cardiologist:  [name], MD  Advanced Practice Provider:  No care team member to display Electrophysiologist:  None      Chief Complaint    Linda Glass is a 88 y.o. female presents today for hypertension follow up.    Past Medical History    Past Medical History:  Diagnosis Date   Anemia 04/2020   Anxiety    Arthritis    Chicken pox    COVID-19    Heart murmur    Hyperlipidemia    Hypertension    Past Surgical History:  Procedure Laterality Date   ABDOMINAL HYSTERECTOMY  1983   BIOPSY  05/05/2020   Procedure: BIOPSY;  Surgeon: Mauri Pole, MD;  Location: [facility];  Service: Endoscopy;;  EGD and COLON   COLONOSCOPY WITH PROPOFOL N/A 05/05/2020   Procedure: COLONOSCOPY WITH PROPOFOL;  Surgeon: Mauri Pole, MD;  Location: [facility];  Service: Endoscopy;  Laterality: N/A;   ESOPHAGOGASTRODUODENOSCOPY (EGD) WITH PROPOFOL N/A 05/05/2020   Procedure: ESOPHAGOGASTRODUODENOSCOPY (EGD) WITH PROPOFOL;  Surgeon: Mauri Pole, MD;  Location: [facility];  Service: Endoscopy;  Laterality: N/A;   FRACTURE SURGERY     wrist fracture 2013   LAPAROSCOPIC PARTIAL COLECTOMY Right 03/12/2021   Procedure: LAPAROSCOPIC HAND ASSISTED RIGHT COLECTOMY;  Surgeon: Felicie Morn, MD;  Location: [facility];  Service: General;  Laterality: Right;    Allergies  No Known Allergies  History of Present Illness    Linda Glass is a 88 y.o. female with a hx of hypertension, permanent atrial fibrillation, HLD, chronic diastolic heart failure  last seen 01/05/21.  Initially seen in hospital 04/2020 with new onset atrial fib in setting of GI bleed with Hb 5.4 at the time. Also with diastolic heart failure. Diuresed 7L. EGD revealed gastric ulcer and tubular adenoma at the ileocecal  valve. No malignancy. Required 4u PRBC. Eliquis resumed on discharge. Rate controlled on metoprolol.   Last seen 12/2020 doin overall well. Due to elevated BP, Losartan increased.   Presents today for follow up with her daughter. Since last seen enjoyed a [place] on a cruise at Christmas. BP at home has been 140s/80s. Reports no shortness of breath nor dyspnea on exertion. Reports no chest pain, pressure, or tightness. No edema, orthopnea, PND. Reports no palpitations.    EKGs/Labs/Other Studies Reviewed:   The following studies were reviewed today:    Echo 05/02/2020: 1. Left ventricular ejection fraction, by estimation, is 60 to 65%. The  left ventricle has normal function. The left ventricle has no regional  wall motion abnormalities. There is moderate left ventricular hypertrophy.  Left ventricular diastolic function   could not be evaluated.   2. Right ventricular systolic function is normal. The right ventricular  size is normal. There is moderately elevated pulmonary artery systolic  pressure. The estimated right ventricular systolic pressure is 24.5 mmHg.   3. Left atrial size was mildly dilated.   4. Right atrial size was mildly dilated.   5. The mitral valve is normal in structure. Mild mitral valve  regurgitation.   6. The aortic valve is calcified. There is mild calcification of the  aortic valve. There is mild thickening of the aortic valve. Aortic valve  regurgitation is not visualized. No aortic stenosis is present.   7. The inferior vena cava is  dilated in size with <50% respiratory  variability, suggesting right atrial pressure of 15 mmHg.   EKG:  EKG is ordered today.  The ekg ordered today demonstrates atrial fibrillation 69 bpm with incomplete right bundle branch block.  Recent Labs: 11/14/2020: ALT 20 03/16/2021: BUN 14; Creatinine, Ser 0.60; Hemoglobin 8.3; Platelets 127; Potassium 3.7; Sodium 131  Recent Lipid Panel    Component Value Date/Time   CHOL 122  02/11/2020 1103   TRIG 145 02/11/2020 1103   HDL 63 02/11/2020 1103   CHOLHDL 1.9 02/11/2020 1103   VLDL 72.8 (H) 08/21/2016 1014   LDLCALC 36 02/11/2020 1103   LDLDIRECT 120.0 08/26/2017 1005    Risk Assessment/Calculations:   CHA2DS2-VASc Score = 6   This indicates a 9.7% annual risk of stroke. The patient's score is based upon: CHF History: 1 HTN History: 1 Diabetes History: 0 Stroke History: 0 Vascular Disease History: 1 (scattered calcifications seen on CT) Age Score: 2 Gender Score: 1     Home Medications   Current Meds  Medication Sig   acetaminophen (TYLENOL) 500 MG tablet Take 500 mg by mouth every 6 (six) hours as needed for moderate pain.   atorvastatin (LIPITOR) 40 MG tablet TAKE 1 TABLET EACH DAY.   citalopram (CELEXA) 10 MG tablet TAKE ONE TABLET BY MOUTH ONCE DAILY   Cyanocobalamin (VITAMIN B 12 PO) Take 500 mcg by mouth daily.   ELIQUIS 5 MG TABS tablet TAKE ONE TABLET BY MOUTH TWICE DAILY   Ferrous Gluconate 324 (37.5 Fe) MG TABS Take 1 tablet by mouth daily.   furosemide (LASIX) 80 MG tablet Take 1 tablet (80 mg total) by mouth daily.   LORazepam (ATIVAN) 0.5 MG tablet TAKE 1/2 TABLET AT BEDTIME AS NEEDED FOR ANXIETY.   losartan (COZAAR) 50 MG tablet TAKE ONE TABLET BY MOUTH ONCE DAILY   magnesium oxide (MAG-OX) 400 (241.3 Mg) MG tablet Take 0.5 tablets (200 mg total) by mouth daily. (Patient taking differently: Take 400 mg by mouth daily.)   metoprolol succinate (TOPROL-XL) 25 MG 24 hr tablet TAKE ONE TABLET BY MOUTH ONCE DAILY   Multiple Vitamin (MULTIVITAMIN WITH MINERALS) TABS tablet Take 1 tablet by mouth daily.   pantoprazole (PROTONIX) 40 MG tablet Take 1 tablet (40 mg total) by mouth daily.     Review of Systems      All other systems reviewed and are otherwise negative except as noted above.  Physical Exam    VS:  BP (!) 142/92   Pulse 69   Ht '5\' 1"'$  (1.549 m)   Wt 156 lb (70.8 kg)   BMI 29.48 kg/m  , BMI Body mass index is 29.48  kg/m.  Wt Readings from Last 3 Encounters:  08/23/21 156 lb (70.8 kg)  03/26/21 160 lb (72.6 kg)  03/16/21 167 lb 8.8 oz (76 kg)     GEN: Well nourished, well developed, in no acute distress. HEENT: normal. Neck: Supple, no JVD, carotid bruits, or masses. Cardiac: IRIR, no murmurs, rubs, or gallops. No clubbing, cyanosis, edema.  Radials/PT 2+ and equal bilaterally.  Respiratory:  Respirations regular and unlabored, clear to auscultation bilaterally. GI: Soft, nontender, nondistended. MS: No deformity or atrophy. Skin: Warm and dry, no rash. Neuro:  Strength and sensation are intact. Psych: Normal affect.  Assessment & Plan    Permanent atrial fib / [place] - Asymptomatic in regards to her atrial fibrillation. Continue Toprol '25mg'$  QD. Continue Eliquis '5mg'$  BID. Does not meet dose reduction criteria. Denies bleeding complications. CHA2DS2-VASc  Score = 6 [CHF History: 1, HTN History: 1, Diabetes History: 0, Stroke History: 0, Vascular Disease History: 1 (scattered calcifications seen on CT), Age Score: 2, Gender Score: 1].  Therefore, the patient's annual risk of stroke is 9.7 %.      HFpEF - Euvolemic on exam. Continue current dose Lasix, Spironolactone.  Low sodium diet, fluid restriction <2L, and daily weights encouraged. Educated to contact our office for weight gain of 2 lbs overnight or 5 lbs in one week.   HTN - BP not at goal. Increase Losartan to '100mg'$  QD. Follow up BMP. Continue Lasix, Toprol, Spironolactone at current doses.      Disposition: Follow up  4-6 months   with [name], MD or APP.  Signed, Loel Dubonnet, NP 08/23/2021, 12:03 PM [place]

## 2021-08-24 DIAGNOSIS — I4891 Unspecified atrial fibrillation: Secondary | ICD-10-CM | POA: Diagnosis not present

## 2021-08-24 DIAGNOSIS — I10 Essential (primary) hypertension: Secondary | ICD-10-CM

## 2021-08-24 DIAGNOSIS — E785 Hyperlipidemia, unspecified: Secondary | ICD-10-CM

## 2021-08-25 ENCOUNTER — Encounter (HOSPITAL_BASED_OUTPATIENT_CLINIC_OR_DEPARTMENT_OTHER): Payer: Self-pay | Admitting: Family

## 2021-09-16 ENCOUNTER — Telehealth: Payer: Self-pay | Admitting: Physician Assistant

## 2021-09-16 NOTE — Telephone Encounter (Signed)
The patient's neighbor Mrs. Seymour Bars patient after hour answering service at the request of the patient to discuss medication changes.  Patient was seen near the end of June and her losartan was increased to 100 mg daily from the previous 50 mg daily.  She did not tolerate this very well and became more imbalanced and dizzy afterward.  She has went back to 50 mg daily dosing of losartan.  She did fall last night and have some rib pain.  She did not hit her head on the Eliquis.  I asked her neighbor to inform the patient to call her PCP Monday morning to schedule a sooner appointment so they can evaluate her to make sure there is no fracture.  As for her current blood pressures in the 120s over 50.  Diastolic pressure is borderline low, however systolic blood pressure is normal.  I recommended to keep a blood pressure diary.  If systolic blood pressures persistently lower than 790 mmHg or diastolic blood pressure is persistently lower than 50 mmHg, I would recommend reducing the losartan further down to 25 mg daily.  She was give [place] a call if her blood pressure remain persistently abnormal.  Otherwise, we discussed the importance of hydration and changed body positions slowly to avoid orthostatic changes.

## 2021-09-17 ENCOUNTER — Emergency Department (HOSPITAL_COMMUNITY): Payer: Medicare Other

## 2021-09-17 ENCOUNTER — Telehealth: Payer: Self-pay | Admitting: Family Medicine

## 2021-09-17 ENCOUNTER — Encounter (HOSPITAL_COMMUNITY): Payer: Self-pay | Admitting: Emergency Medicine

## 2021-09-17 ENCOUNTER — Observation Stay (HOSPITAL_COMMUNITY)
Admission: EM | Admit: 2021-09-17 | Discharge: 2021-09-21 | Disposition: A | Discharge status: Skilled Nursing Facility | Payer: Medicare Other | Attending: Internal Medicine | Admitting: Internal Medicine

## 2021-09-17 ENCOUNTER — Ambulatory Visit: Payer: Medicare Other | Admitting: Family Medicine

## 2021-09-17 DIAGNOSIS — N6310 Unspecified lump in the right breast, unspecified quadrant: Secondary | ICD-10-CM | POA: Diagnosis not present

## 2021-09-17 DIAGNOSIS — N179 Acute kidney failure, unspecified: Secondary | ICD-10-CM | POA: Diagnosis not present

## 2021-09-17 DIAGNOSIS — D696 Thrombocytopenia, unspecified: Secondary | ICD-10-CM | POA: Diagnosis not present

## 2021-09-17 DIAGNOSIS — N83209 Unspecified ovarian cyst, unspecified side: Secondary | ICD-10-CM | POA: Insufficient documentation

## 2021-09-17 DIAGNOSIS — W19XXXA Unspecified fall, initial encounter: Secondary | ICD-10-CM | POA: Insufficient documentation

## 2021-09-17 DIAGNOSIS — S22060A Wedge compression fracture of T7-T8 vertebra, initial encounter for closed fracture: Secondary | ICD-10-CM | POA: Diagnosis not present

## 2021-09-17 DIAGNOSIS — Z9181 History of falling: Secondary | ICD-10-CM | POA: Insufficient documentation

## 2021-09-17 DIAGNOSIS — D539 Nutritional anemia, unspecified: Secondary | ICD-10-CM | POA: Insufficient documentation

## 2021-09-17 DIAGNOSIS — S22000A Wedge compression fracture of unspecified thoracic vertebra, initial encounter for closed fracture: Secondary | ICD-10-CM

## 2021-09-17 DIAGNOSIS — Z79899 Other long term (current) drug therapy: Secondary | ICD-10-CM | POA: Insufficient documentation

## 2021-09-17 DIAGNOSIS — E871 Hypo-osmolality and hyponatremia: Secondary | ICD-10-CM | POA: Diagnosis not present

## 2021-09-17 DIAGNOSIS — I4819 Other persistent atrial fibrillation: Secondary | ICD-10-CM | POA: Diagnosis not present

## 2021-09-17 DIAGNOSIS — I10 Essential (primary) hypertension: Secondary | ICD-10-CM | POA: Diagnosis present

## 2021-09-17 DIAGNOSIS — Z8616 Personal history of COVID-19: Secondary | ICD-10-CM | POA: Insufficient documentation

## 2021-09-17 DIAGNOSIS — R197 Diarrhea, unspecified: Secondary | ICD-10-CM | POA: Diagnosis present

## 2021-09-17 DIAGNOSIS — Z7901 Long term (current) use of anticoagulants: Secondary | ICD-10-CM | POA: Diagnosis not present

## 2021-09-17 DIAGNOSIS — R110 Nausea: Secondary | ICD-10-CM | POA: Insufficient documentation

## 2021-09-17 DIAGNOSIS — E782 Mixed hyperlipidemia: Secondary | ICD-10-CM | POA: Diagnosis present

## 2021-09-17 DIAGNOSIS — E785 Hyperlipidemia, unspecified: Secondary | ICD-10-CM | POA: Diagnosis present

## 2021-09-17 DIAGNOSIS — E860 Dehydration: Secondary | ICD-10-CM | POA: Diagnosis not present

## 2021-09-17 DIAGNOSIS — E872 Acidosis, unspecified: Secondary | ICD-10-CM | POA: Insufficient documentation

## 2021-09-17 DIAGNOSIS — Z9989 Dependence on other enabling machines and devices: Secondary | ICD-10-CM | POA: Diagnosis not present

## 2021-09-17 LAB — URINALYSIS, ROUTINE W REFLEX MICROSCOPIC
Bilirubin Urine: NEGATIVE
Glucose, UA: NEGATIVE mg/dL
Hgb urine dipstick: NEGATIVE
Ketones, ur: NEGATIVE mg/dL
Leukocytes,Ua: NEGATIVE
Nitrite: NEGATIVE
Protein, ur: NEGATIVE mg/dL
Specific Gravity, Urine: 1.008 (ref 1.005–1.030)
pH: 5 (ref 5.0–8.0)

## 2021-09-17 LAB — CBC WITH DIFFERENTIAL/PLATELET
Abs Immature Granulocytes: 0.01 10*3/uL (ref 0.00–0.07)
Basophils Absolute: 0 10*3/uL (ref 0.0–0.1)
Basophils Relative: 0 %
Eosinophils Absolute: 0 10*3/uL (ref 0.0–0.5)
Eosinophils Relative: 0 %
HCT: 35 % — ABNORMAL LOW (ref 36.0–46.0)
Hemoglobin: 12.4 g/dL (ref 12.0–15.0)
Immature Granulocytes: 0 %
Lymphocytes Relative: 13 %
Lymphs Abs: 1 10*3/uL (ref 0.7–4.0)
MCH: 37.9 pg — ABNORMAL HIGH (ref 26.0–34.0)
MCHC: 35.4 g/dL (ref 30.0–36.0)
MCV: 107 fL — ABNORMAL HIGH (ref 80.0–100.0)
Monocytes Absolute: 0.5 10*3/uL (ref 0.1–1.0)
Monocytes Relative: 6 %
Neutro Abs: 5.9 10*3/uL (ref 1.7–7.7)
Neutrophils Relative %: 81 %
Platelets: 121 10*3/uL — ABNORMAL LOW (ref 150–400)
RBC: 3.27 MIL/uL — ABNORMAL LOW (ref 3.87–5.11)
RDW: 12.1 % (ref 11.5–15.5)
WBC: 7.3 10*3/uL (ref 4.0–10.5)
nRBC: 0 % (ref 0.0–0.2)

## 2021-09-17 LAB — COMPREHENSIVE METABOLIC PANEL
ALT: 22 U/L (ref 0–44)
AST: 32 U/L (ref 15–41)
Albumin: 4 g/dL (ref 3.5–5.0)
Alkaline Phosphatase: 92 U/L (ref 38–126)
Anion gap: 13 (ref 5–15)
BUN: 57 mg/dL — ABNORMAL HIGH (ref 8–23)
CO2: 14 mmol/L — ABNORMAL LOW (ref 22–32)
Calcium: 9.6 mg/dL (ref 8.9–10.3)
Chloride: 93 mmol/L — ABNORMAL LOW (ref 98–111)
Creatinine, Ser: 1.91 mg/dL — ABNORMAL HIGH (ref 0.44–1.00)
GFR, Estimated: 25 mL/min — ABNORMAL LOW (ref >60)
Glucose, Bld: 133 mg/dL — ABNORMAL HIGH (ref 70–99)
Potassium: 4.4 mmol/L (ref 3.5–5.1)
Sodium: 120 mmol/L — ABNORMAL LOW (ref 135–145)
Total Bilirubin: 1.5 mg/dL — ABNORMAL HIGH (ref 0.3–1.2)
Total Protein: 7.2 g/dL (ref 6.5–8.1)

## 2021-09-17 MED ORDER — ACETAMINOPHEN 650 MG RE SUPP: 650.0000 mg | Freq: Four times a day (QID) | RECTAL | Status: DC | PRN

## 2021-09-17 MED ORDER — ONDANSETRON HCL 4 MG/2ML IJ SOLN
4.0000 mg | Freq: Once | INTRAMUSCULAR | Status: AC
  Administered 2021-09-17: 4 mg via INTRAVENOUS
  Filled 2021-09-17: qty 2

## 2021-09-17 MED ORDER — HYDRALAZINE HCL 20 MG/ML IJ SOLN: 10.0000 mg | Freq: Four times a day (QID) | INTRAMUSCULAR | Status: DC | PRN

## 2021-09-17 MED ORDER — LORAZEPAM 0.5 MG PO TABS
0.2500 mg | ORAL_TABLET | Freq: Every evening | ORAL | Status: DC | PRN
Start: 2021-09-17 — End: 2021-09-21
  Administered 2021-09-17 – 2021-09-20 (×4): 0.25 mg via ORAL
  Filled 2021-09-17 (×4): qty 1

## 2021-09-17 MED ORDER — SODIUM CHLORIDE 0.9 % IV BOLUS
1000.0000 mL | Freq: Once | INTRAVENOUS | Status: AC
  Administered 2021-09-17: 1000 mL via INTRAVENOUS

## 2021-09-17 MED ORDER — HYDROCODONE-ACETAMINOPHEN 5-325 MG PO TABS
1.0000 | ORAL_TABLET | ORAL | Status: DC | PRN
  Administered 2021-09-20: 1 via ORAL
  Administered 2021-09-21: 2 via ORAL
  Filled 2021-09-17: qty 1
  Filled 2021-09-17: qty 2

## 2021-09-17 MED ORDER — ONDANSETRON HCL 4 MG/2ML IJ SOLN: 4.0000 mg | Freq: Four times a day (QID) | INTRAMUSCULAR | Status: DC | PRN

## 2021-09-17 MED ORDER — SODIUM CHLORIDE 0.9 % IV SOLN: INTRAVENOUS | Status: DC

## 2021-09-17 MED ORDER — APIXABAN 5 MG PO TABS
5.0000 mg | ORAL_TABLET | Freq: Two times a day (BID) | ORAL | Status: DC
  Administered 2021-09-17 – 2021-09-21 (×8): 5 mg via ORAL
  Filled 2021-09-17 (×8): qty 1

## 2021-09-17 MED ORDER — PANTOPRAZOLE SODIUM 40 MG PO TBEC
40.0000 mg | DELAYED_RELEASE_TABLET | Freq: Every day | ORAL | Status: DC
  Administered 2021-09-17 – 2021-09-21 (×5): 40 mg via ORAL
  Filled 2021-09-17 (×5): qty 1

## 2021-09-17 MED ORDER — MORPHINE SULFATE (PF) 2 MG/ML IV SOLN: 2.0000 mg | INTRAVENOUS | Status: DC | PRN

## 2021-09-17 MED ORDER — ATORVASTATIN CALCIUM 40 MG PO TABS
40.0000 mg | ORAL_TABLET | Freq: Every day | ORAL | Status: DC
  Administered 2021-09-18 – 2021-09-21 (×4): 40 mg via ORAL
  Filled 2021-09-17 (×4): qty 1

## 2021-09-17 MED ORDER — CITALOPRAM HYDROBROMIDE 20 MG PO TABS
10.0000 mg | ORAL_TABLET | Freq: Every day | ORAL | Status: DC
  Administered 2021-09-17 – 2021-09-21 (×5): 10 mg via ORAL
  Filled 2021-09-17 (×5): qty 1

## 2021-09-17 MED ORDER — ONDANSETRON HCL 4 MG PO TABS: 4.0000 mg | ORAL_TABLET | Freq: Four times a day (QID) | ORAL | Status: DC | PRN

## 2021-09-17 MED ORDER — ACETAMINOPHEN 325 MG PO TABS
650.0000 mg | ORAL_TABLET | Freq: Four times a day (QID) | ORAL | Status: DC | PRN
  Administered 2021-09-18 – 2021-09-20 (×7): 650 mg via ORAL
  Filled 2021-09-17 (×7): qty 2

## 2021-09-17 MED ORDER — METOPROLOL SUCCINATE ER 50 MG PO TB24
50.0000 mg | ORAL_TABLET | Freq: Every day | ORAL | Status: DC
  Administered 2021-09-17 – 2021-09-21 (×5): 50 mg via ORAL
  Filled 2021-09-17 (×5): qty 1

## 2021-09-17 NOTE — ED Notes (Signed)
Bladder scan complete (x2). Showed 26m.

## 2021-09-17 NOTE — ED Triage Notes (Signed)
Patient reports fall last weekend with pain since that time. States today feeling weak with diarrhea.

## 2021-09-17 NOTE — Telephone Encounter (Signed)
Patient is currently at [facility] ED.

## 2021-09-17 NOTE — ED Provider Triage Note (Signed)
Emergency Medicine Provider Triage Evaluation Note  Linda Glass , a 88 y.o. female  was evaluated in triage.  Pt complains of "feeling bad."  Patient states that she experienced a fall yesterday when trying to go up a step using a walker.  She states she had a "misstep" and fell landing on her left side.  She denies presyncopal type symptoms, trauma to head, blood thinner use, loss of consciousness.  She states that since the fall she has had left-sided flank/back pain.  She also notes decreased appetite.  She states that she has had diarrhea the past few days but states that it is normal for her.  Denies hematochezia/melena.  She denies fever, chills, night sweats, chest pain, shortness of breath, abdominal pain, vomiting, urinary symptoms symptoms.  Denies saddle anesthesia, weakness/numbness in lower extremities, urinary/bowel dysfunction, fever.  Review of Systems  Positive:  Negative: See above  Physical Exam  BP 128/70 (BP Location: Left Arm)   Pulse 72   Temp (!) 97.4 F (36.3 C) (Oral)   Resp 18   SpO2 100%  Gen:   Awake, no distress   Resp:  Normal effort  MSK:   Moves extremities without difficulty  Other:  Patient has no tenderness to palpation of abdomen.  Patient states left flank has been causing her pain but it is minimal reproducible on exam.  No midline tenderness of C, T, L-spine.  Medical Decision Making  Medically screening exam initiated at 1:22 PM.  Appropriate orders placed.  Huyen Daliya Parchment was informed that the remainder of the evaluation will be completed by another provider, this initial triage assessment does not replace that evaluation, and the importance of remaining in the ED until their evaluation is complete.     Wilnette Kales, [place] 09/17/21 1324

## 2021-09-17 NOTE — ED Provider Notes (Signed)
[facility] DEPT Provider Note   CSN: [id number] Arrival date & time: 09/17/21  1301     History  Chief Complaint  Patient presents with   Diarrhea   Back Pain    Linda Glass is a 88 y.o. female.  Pt is a 88 yo female with a pmhx significant for arthritis, anxiety, hx gib, HLD, afib and HTN.  Pt said she has been feeling very bad for 3 days.  She has had diarrhea and nausea.  She fell yesterday and hurt her back.  She has had very little appetite.  She lives alone.  Her daughter is on a cruise in the [place].  A neighbor brought her to the ED today.  Pt has been able to ambulate without feeling dizzy when she stands up.  She denies hitting his head.  She is on Eliquis for afib. She has not had fevers.       Home Medications Prior to Admission medications   Medication Sig Start Date End Date Taking? Authorizing Provider  acetaminophen (TYLENOL) 500 MG tablet Take 500 mg by mouth every 6 (six) hours as needed for moderate pain.    [provider]  atorvastatin (LIPITOR) 40 MG tablet TAKE 1 TABLET EACH DAY. 05/30/21   [name], [name], MD  citalopram (CELEXA) 10 MG tablet TAKE ONE TABLET BY MOUTH ONCE DAILY 08/15/21   [name], [name], MD  Cyanocobalamin (VITAMIN B 12 PO) Take 500 mcg by mouth daily.    [provider]  ELIQUIS 5 MG TABS tablet TAKE ONE TABLET BY MOUTH TWICE DAILY 02/21/21   [name], [name], MD  Ferrous Gluconate 324 (37.5 Fe) MG TABS Take 1 tablet by mouth daily.    [provider]  furosemide (LASIX) 80 MG tablet Take 1 tablet (80 mg total) by mouth daily. 01/05/21   [name], MD  LORazepam (ATIVAN) 0.5 MG tablet TAKE 1/2 TABLET AT BEDTIME AS NEEDED FOR ANXIETY. 05/31/21   [name], [name], MD  losartan (COZAAR) 100 MG tablet Take 1 tablet (100 mg total) by mouth daily. 08/23/21 08/18/22  Loel Dubonnet, NP  magnesium oxide (MAG-OX) 400 (241.3 Mg) MG tablet Take 0.5 tablets (200 mg total)  by mouth daily. Patient taking differently: Take 400 mg by mouth daily. 05/12/20   [place] Donnamarie Poag, DO  metoprolol succinate (TOPROL-XL) 25 MG 24 hr tablet TAKE ONE TABLET BY MOUTH ONCE DAILY 03/20/21   [name], [name], MD  Multiple Vitamin (MULTIVITAMIN WITH MINERALS) TABS tablet Take 1 tablet by mouth daily.    [provider]  pantoprazole (PROTONIX) 40 MG tablet Take 1 tablet (40 mg total) by mouth daily. 04/30/21   [name], [name], MD  spironolactone (ALDACTONE) 25 MG tablet Take 1 tablet (25 mg total) by mouth daily. 01/05/21 04/05/21  [name], MD      Allergies    Patient has no known allergies.    Review of Systems   Review of Systems  Gastrointestinal:  Positive for diarrhea and nausea.  All other systems reviewed and are negative.   Physical Exam Updated Vital Signs BP (!) 157/77   Pulse 60   Temp (!) 97.4 F (36.3 C) (Oral)   Resp 19   SpO2 100%  Physical Exam Vitals and nursing note reviewed.  Constitutional:      Appearance: Normal appearance.  HENT:     Head: Normocephalic and atraumatic.     Right Ear: External ear normal.  Left Ear: External ear normal.     Nose: Nose normal.     Mouth/Throat:     Mouth: Mucous membranes are dry.  Eyes:     Extraocular Movements: Extraocular movements intact.     Conjunctiva/sclera: Conjunctivae normal.     Pupils: Pupils are equal, round, and reactive to light.  Cardiovascular:     Rate and Rhythm: Regular rhythm. Bradycardia present.     Pulses: Normal pulses.     Heart sounds: Normal heart sounds.  Pulmonary:     Effort: Pulmonary effort is normal.     Breath sounds: Normal breath sounds.  Abdominal:     General: Abdomen is flat. Bowel sounds are normal.     Palpations: Abdomen is soft.  Musculoskeletal:        General: Normal range of motion.     Cervical back: Normal range of motion.  Skin:    General: Skin is warm.     Capillary Refill: Capillary refill takes less than 2 seconds.   Neurological:     General: No focal deficit present.     Mental Status: She is alert and oriented to person, place, and time.  Psychiatric:        Mood and Affect: Mood normal.        Behavior: Behavior normal.     ED Results / Procedures / Treatments   Labs (all labs ordered are listed, but only abnormal results are displayed) Labs Reviewed  COMPREHENSIVE METABOLIC PANEL - Abnormal; Notable for the following components:      Result Value   Sodium 120 (*)    Chloride 93 (*)    CO2 14 (*)    Glucose, Bld 133 (*)    BUN 57 (*)    Creatinine, Ser 1.91 (*)    Total Bilirubin 1.5 (*)    GFR, Estimated 25 (*)    All other components within normal limits  CBC WITH DIFFERENTIAL/PLATELET - Abnormal; Notable for the following components:   RBC 3.27 (*)    HCT 35.0 (*)    MCV 107.0 (*)    MCH 37.9 (*)    Platelets 121 (*)    All other components within normal limits  GASTROINTESTINAL PANEL BY PCR, STOOL (REPLACES STOOL CULTURE)  C DIFFICILE QUICK SCREEN W PCR REFLEX    URINALYSIS, ROUTINE W REFLEX MICROSCOPIC  OSMOLALITY    EKG EKG Interpretation  Date/Time:  Monday September 17 2021 13:36:57 EDT Ventricular Rate:  71 PR Interval:    QRS Duration: 108 QT Interval:  394 QTC Calculation: 429 R Axis:   -34 Text Interpretation: Atrial fibrillation Incomplete RBBB and LAFB Abnormal R-wave progression, early transition No significant change since last tracing Confirmed by Isla Pence [phone]) on 09/17/2021 3:52:51 PM  Radiology CT Chest Wo Contrast  Result Date: 09/17/2021 CLINICAL DATA:  Abdominal pain, acute, nonlocalized; Chest trauma, blunt EXAM: CT CHEST, ABDOMEN AND PELVIS WITHOUT CONTRAST TECHNIQUE: Multidetector CT imaging of the chest, abdomen and pelvis was performed following the standard protocol without IV contrast. RADIATION DOSE REDUCTION: This exam was performed according to the departmental dose-optimization program which includes automated exposure control,  adjustment of the mA and/or kV according to patient size and/or use of iterative reconstruction technique. COMPARISON:  CT of the abdomen and pelvis dated November 21, 2020 and CT angio chest dated May 05, 2020 FINDINGS: CT CHEST FINDINGS Cardiovascular: Ascending thoracic aorta has a normal appearance. Moderately severe atheromatous calcifications of the thoracic aorta. Atheromatous calcifications of the coronary  arteries as well. Normal heart size. No pericardial effusion. Mediastinum/Nodes: No enlarged mediastinal, hilar, or axillary lymph nodes. Thyroid gland, and trachea demonstrate no significant findings. Moderately large hiatal hernia. Lungs/Pleura: Lungs are clear except for some linear scarring at the lung bases greater on the right. No pleural effusion or pneumothorax. Musculoskeletal: There is lobulated soft tissue mass density seen in the right breast (image 27/2) measuring 3.8 x 2.7 cm. Moderate thoracic spondylosis. Mild decrease in the height of the T8 vertebral body measuring about 30% and is new since the previous study. CT ABDOMEN PELVIS FINDINGS Hepatobiliary: Small gallstones in the dependent gallbladder without cholecystitis. Liver has a normal appearance. Pancreas: Unremarkable. No pancreatic ductal dilatation or surrounding inflammatory changes. Spleen: Normal in size without focal abnormality. Adrenals/Urinary Tract: Adrenal glands are unremarkable. Kidneys are normal, without renal calculi, focal lesion, or hydronephrosis. Bladder is unremarkable. Stomach/Bowel: Most of the stomach has herniated into the hiatal hernia sac. Bowel-gas pattern is nonobstructive. Severe diverticulosis of the sigmoid colon without diverticulitis. Vascular/Lymphatic: Severe atheromatous calcifications of the abdominal aorta and iliac arteries. No aneurysm. No significant abdominal/pelvic lymphadenopathy. Reproductive: Status post hysterectomy. There is a simple appearing cyst at the posteroinferior pelvis  measuring 4.6 cm AP x 6 cm transverse in comparison to 3.9 x 5.3 cm on the previous study. Other: No abdominal wall hernia or abnormality. No abdominopelvic ascites. Musculoskeletal: Again seen is the vertebra plana with severe compression fracture of the T12. Mild anterolisthesis at L4-L5. Moderate thoracolumbar spondylosis. IMPRESSION: 1. Moderately severe thoracic and aortic atherosclerosis. Coronary artery atherosclerosis. 2.  Moderately large hiatal hernia, stable. 3. Tiny gallstones in the dependent gallbladder without cholecystitis. 4. Severe diverticulosis of the sigmoid colon without diverticulitis. 5. Status post hysterectomy. Simple appearing cyst at the inferior pelvis measuring 4.6 x 6 cm in comparison to 3.9 x 5.3 cm on the previous study likely an ovarian cyst. Suggest non-emergent ultrasound examination of the pelvis. 6. Mild compression fracture of the T8 vertebral body with about 30% decrease in the height and is new since the previous study. Severe compression fracture of the T12 is stable. Electronically Signed   By: Frazier Richards M.D.   On: 09/17/2021 15:53   CT ABDOMEN PELVIS WO CONTRAST  Result Date: 09/17/2021 CLINICAL DATA:  Abdominal pain, acute, nonlocalized; Chest trauma, blunt EXAM: CT CHEST, ABDOMEN AND PELVIS WITHOUT CONTRAST TECHNIQUE: Multidetector CT imaging of the chest, abdomen and pelvis was performed following the standard protocol without IV contrast. RADIATION DOSE REDUCTION: This exam was performed according to the departmental dose-optimization program which includes automated exposure control, adjustment of the mA and/or kV according to patient size and/or use of iterative reconstruction technique. COMPARISON:  CT of the abdomen and pelvis dated November 21, 2020 and CT angio chest dated May 05, 2020 FINDINGS: CT CHEST FINDINGS Cardiovascular: Ascending thoracic aorta has a normal appearance. Moderately severe atheromatous calcifications of the thoracic aorta.  Atheromatous calcifications of the coronary arteries as well. Normal heart size. No pericardial effusion. Mediastinum/Nodes: No enlarged mediastinal, hilar, or axillary lymph nodes. Thyroid gland, and trachea demonstrate no significant findings. Moderately large hiatal hernia. Lungs/Pleura: Lungs are clear except for some linear scarring at the lung bases greater on the right. No pleural effusion or pneumothorax. Musculoskeletal: There is lobulated soft tissue mass density seen in the right breast (image 27/2) measuring 3.8 x 2.7 cm. Moderate thoracic spondylosis. Mild decrease in the height of the T8 vertebral body measuring about 30% and is new since the previous study. CT ABDOMEN PELVIS  FINDINGS Hepatobiliary: Small gallstones in the dependent gallbladder without cholecystitis. Liver has a normal appearance. Pancreas: Unremarkable. No pancreatic ductal dilatation or surrounding inflammatory changes. Spleen: Normal in size without focal abnormality. Adrenals/Urinary Tract: Adrenal glands are unremarkable. Kidneys are normal, without renal calculi, focal lesion, or hydronephrosis. Bladder is unremarkable. Stomach/Bowel: Most of the stomach has herniated into the hiatal hernia sac. Bowel-gas pattern is nonobstructive. Severe diverticulosis of the sigmoid colon without diverticulitis. Vascular/Lymphatic: Severe atheromatous calcifications of the abdominal aorta and iliac arteries. No aneurysm. No significant abdominal/pelvic lymphadenopathy. Reproductive: Status post hysterectomy. There is a simple appearing cyst at the posteroinferior pelvis measuring 4.6 cm AP x 6 cm transverse in comparison to 3.9 x 5.3 cm on the previous study. Other: No abdominal wall hernia or abnormality. No abdominopelvic ascites. Musculoskeletal: Again seen is the vertebra plana with severe compression fracture of the T12. Mild anterolisthesis at L4-L5. Moderate thoracolumbar spondylosis. IMPRESSION: 1. Moderately severe thoracic and aortic  atherosclerosis. Coronary artery atherosclerosis. 2.  Moderately large hiatal hernia, stable. 3. Tiny gallstones in the dependent gallbladder without cholecystitis. 4. Severe diverticulosis of the sigmoid colon without diverticulitis. 5. Status post hysterectomy. Simple appearing cyst at the inferior pelvis measuring 4.6 x 6 cm in comparison to 3.9 x 5.3 cm on the previous study likely an ovarian cyst. Suggest non-emergent ultrasound examination of the pelvis. 6. Mild compression fracture of the T8 vertebral body with about 30% decrease in the height and is new since the previous study. Severe compression fracture of the T12 is stable. Electronically Signed   By: Frazier Richards M.D.   On: 09/17/2021 15:53    Procedures Procedures    Medications Ordered in ED Medications  0.9 %  sodium chloride infusion (has no administration in time range)  sodium chloride 0.9 % bolus 1,000 mL (1,000 mLs Intravenous New Bag/Given 09/17/21 1543)  ondansetron (ZOFRAN) injection 4 mg (4 mg Intravenous Given 09/17/21 1545)    ED Course/ Medical Decision Making/ A&P                           Medical Decision Making Amount and/or Complexity of Data Reviewed Labs: ordered. Radiology: ordered.  Risk Prescription drug management. Decision regarding hospitalization.   This patient presents to the ED for concern of weakness, this involves an extensive number of treatment options, and is a complaint that carries with it a high risk of complications and morbidity.  The differential diagnosis includes electrolyte abn, infection, anemia   Co morbidities that complicate the patient evaluation  arthritis, anxiety, hx gib, HLD, afib and HTN   Additional history obtained:  Additional history obtained from epic chart review External records from outside source obtained and reviewed including neighbor   Lab Tests:  I Ordered, and personally interpreted labs.  The pertinent results include:  cbc with hgb 12.4, cmp  with na 120 (Na 131 in Jan) and bun/cr elevated (57/1.91).  In Jan, BUN 14 and Cr 0.6   Imaging Studies ordered:  I ordered imaging studies including ct chest, ct abd/pelvis  I independently visualized and interpreted imaging which showed  CT chest: IMPRESSION:  1. Moderately severe thoracic and aortic atherosclerosis. Coronary  artery atherosclerosis.    2.  Moderately large hiatal hernia, stable.    3. Tiny gallstones in the dependent gallbladder without  cholecystitis.    4. Severe diverticulosis of the sigmoid colon without  diverticulitis.    5. Status post hysterectomy. Simple appearing cyst at the  inferior  pelvis measuring 4.6 x 6 cm in comparison to 3.9 x 5.3 cm on the  previous study likely an ovarian cyst. Suggest non-emergent  ultrasound examination of the pelvis.    6. Mild compression fracture of the T8 vertebral body with about 30%  decrease in the height and is new since the previous study. Severe  compression fracture of the T12 is stable.   I agree with the radiologist interpretation   Cardiac Monitoring:  The patient was maintained on a cardiac monitor.  I personally viewed and interpreted the cardiac monitored which showed an underlying rhythm of: afib with svr   Medicines ordered and prescription drug management:  I ordered medication including zofran and ivfs  for nausea and dehydration  Reevaluation of the patient after these medicines showed that the patient improved I have reviewed the patients home medicines and have made adjustments as needed   Test Considered:  Ct scans   Critical Interventions:  ivfs   Consultations Obtained:  I requested consultation with the hospitalist (Dr. Roderic Palau),  and discussed lab and imaging findings as well as pertinent plan - he will admit  Problem List / ED Course:  Dehydration with AKI:  pt given IVFs Hyponatremia:  pt is on lasix Diarrhea:  no stool while in the ED.  Stool cultures have been  ordered.  CT without colitis Nausea:  improved with zofran T8 compression fx:  pain under control now Dizziness with ambulation:  likely due to orthostasis from dehydration Ovarian cyst:  pt knows about this, but does not want any further eval or intervention regarding the cyst.  Reevaluation:  After the interventions noted above, I reevaluated the patient and found that they have :improved   Social Determinants of Health:  Lives alone; daughter on a cruise   Dispostion:  After consideration of the diagnostic results and the patients response to treatment, I feel that the patent would benefit from admission.          Final Clinical Impression(s) / ED Diagnoses Final diagnoses:  Dehydration  AKI (acute kidney injury) ([place])  Hyponatremia  Diarrhea, unspecified type  Nausea  Compression fracture of body of thoracic vertebra ([facility])  Cyst of ovary, unspecified laterality    Rx / DC Orders ED Discharge Orders     None         Isla Pence, MD 09/17/21 [phone]

## 2021-09-17 NOTE — H&P (Signed)
History and Physical    Linda Glass DQQ:[id number] DOB: November 02, 1933 DOA: 09/17/2021  PCP: [name], [name], MD  Patient coming from: Home  I have personally briefly reviewed patient's old medical records in [place]  Chief Complaint: Fall  HPI: Linda Glass is a 88 y.o. female with medical history significant of hypertension, atrial fibrillation on anticoagulation, hyperlipidemia.  Patient normally lives alone and ambulates with a walker.  She reports that for the past 3 days she has had frequent stools.  She has had nausea, but no vomiting.  No fevers.  No chest pain or shortness of breath.  She is feeling more tired and weak.  She did end up having a fall when she was walking in her house and reports that she lost her footing.  Denies any syncope or head trauma.  She was brought to the hospital by her neighbor who checks in on her.  ED Course: In the emergency room, labs show that she is hyponatremic at 120 with acute kidney injury with creatinine of 1.9.  Bicarb is low at 14 anion gap is normal at 13.  CT chest abdomen pelvis does show new compression fracture at T8.  No other acute findings.  Review of Systems: As per HPI otherwise 10 point review of systems negative.    Past Medical History:  Diagnosis Date   Anemia 04/2020   Anxiety    Arthritis    Chicken pox    COVID-19    Heart murmur    Hyperlipidemia    Hypertension     Past Surgical History:  Procedure Laterality Date   ABDOMINAL HYSTERECTOMY  1983   BIOPSY  05/05/2020   Procedure: BIOPSY;  Surgeon: Mauri Pole, MD;  Location: [facility];  Service: Endoscopy;;  EGD and COLON   COLONOSCOPY WITH PROPOFOL N/A 05/05/2020   Procedure: COLONOSCOPY WITH PROPOFOL;  Surgeon: Mauri Pole, MD;  Location: [facility];  Service: Endoscopy;  Laterality: N/A;   ESOPHAGOGASTRODUODENOSCOPY (EGD) WITH PROPOFOL N/A 05/05/2020   Procedure: ESOPHAGOGASTRODUODENOSCOPY (EGD) WITH  PROPOFOL;  Surgeon: Mauri Pole, MD;  Location: [facility];  Service: Endoscopy;  Laterality: N/A;   FRACTURE SURGERY     wrist fracture 2013   LAPAROSCOPIC PARTIAL COLECTOMY Right 03/12/2021   Procedure: LAPAROSCOPIC HAND ASSISTED RIGHT COLECTOMY;  Surgeon: Felicie Morn, MD;  Location: [facility];  Service: General;  Laterality: Right;    Social History:  reports that she has never smoked. She has never used smokeless tobacco. She reports that she does not currently use alcohol after a past usage of about 7.0 standard drinks of alcohol per week. She reports that she does not use drugs.  No Known Allergies  Family History  Problem Relation Age of Onset   Hyperlipidemia Mother    Diabetes Neg Hx     Prior to Admission medications   Medication Sig Start Date End Date Taking? Authorizing Provider  acetaminophen (TYLENOL) 500 MG tablet Take 500 mg by mouth every 6 (six) hours as needed for moderate pain.    [provider]  atorvastatin (LIPITOR) 40 MG tablet TAKE 1 TABLET EACH DAY. 05/30/21   [name], [name], MD  citalopram (CELEXA) 10 MG tablet TAKE ONE TABLET BY MOUTH ONCE DAILY 08/15/21   [name], [name], MD  Cyanocobalamin (VITAMIN B 12 PO) Take 500 mcg by mouth daily.    [provider]  ELIQUIS 5 MG TABS tablet TAKE ONE TABLET BY MOUTH TWICE DAILY 02/21/21  [name], [name], MD  Ferrous Gluconate 324 (37.5 Fe) MG TABS Take 1 tablet by mouth daily.    [provider]  furosemide (LASIX) 80 MG tablet Take 1 tablet (80 mg total) by mouth daily. 01/05/21   [name], MD  LORazepam (ATIVAN) 0.5 MG tablet TAKE 1/2 TABLET AT BEDTIME AS NEEDED FOR ANXIETY. 05/31/21   [name], [name], MD  losartan (COZAAR) 100 MG tablet Take 1 tablet (100 mg total) by mouth daily. 08/23/21 08/18/22  Loel Dubonnet, NP  magnesium oxide (MAG-OX) 400 (241.3 Mg) MG tablet Take 0.5 tablets (200 mg total) by mouth daily. Patient taking differently: Take 400 mg by mouth  daily. 05/12/20   [place] Donnamarie Poag, DO  metoprolol succinate (TOPROL-XL) 25 MG 24 hr tablet TAKE ONE TABLET BY MOUTH ONCE DAILY 03/20/21   [name], [name], MD  Multiple Vitamin (MULTIVITAMIN WITH MINERALS) TABS tablet Take 1 tablet by mouth daily.    [provider]  pantoprazole (PROTONIX) 40 MG tablet Take 1 tablet (40 mg total) by mouth daily. 04/30/21   [name], [name], MD  spironolactone (ALDACTONE) 25 MG tablet Take 1 tablet (25 mg total) by mouth daily. 01/05/21 04/05/21  [name], MD    Physical Exam: Vitals:   09/17/21 1630 09/17/21 1645 09/17/21 1700 09/17/21 1708  BP: (!) 159/80  (!) 162/76   Pulse: (!) 57 (!) 59    Resp: (!) '21 16 17   '$ Temp:    97.6 F (36.4 C)  TempSrc:      SpO2: 100% 99%      Constitutional: NAD, calm, comfortable Eyes: PERRL, lids and conjunctivae normal ENMT: Mucous membranes are dry. Posterior pharynx clear of any exudate or lesions.Normal dentition.  Neck: normal, supple, no masses, no thyromegaly Respiratory: clear to auscultation bilaterally, no wheezing, no crackles. Normal respiratory effort. No accessory muscle use.  Cardiovascular: Regular rate and rhythm, no murmurs / rubs / gallops. No extremity edema. 2+ pedal pulses. No carotid bruits.  Abdomen: no tenderness, no masses palpated. No hepatosplenomegaly. Bowel sounds positive.  Musculoskeletal: no clubbing / cyanosis. No joint deformity upper and lower extremities. Good ROM, no contractures. Normal muscle tone.  Skin: no rashes, lesions, ulcers. No induration Neurologic: CN 2-12 grossly intact. Sensation intact, DTR normal. Strength 5/5 in all 4.  Psychiatric: Normal judgment and insight. Alert and oriented x 3. Normal mood.    Labs on Admission: I have personally reviewed following labs and imaging studies  CBC: Recent Labs  Lab 09/17/21 1401  WBC 7.3  NEUTROABS 5.9  HGB 12.4  HCT 35.0*  MCV 107.0*  PLT 992*   Basic Metabolic Panel: Recent Labs  Lab 09/17/21 1401   NA 120*  K 4.4  CL 93*  CO2 14*  GLUCOSE 133*  BUN 57*  CREATININE 1.91*  CALCIUM 9.6   GFR: CrCl cannot be calculated (Unknown ideal weight.). Liver Function Tests: Recent Labs  Lab 09/17/21 1401  AST 32  ALT 22  ALKPHOS 92  BILITOT 1.5*  PROT 7.2  ALBUMIN 4.0   No results for input(s): "LIPASE", "AMYLASE" in the last 168 hours. No results for input(s): "AMMONIA" in the last 168 hours. Coagulation Profile: No results for input(s): "INR", "PROTIME" in the last 168 hours. Cardiac Enzymes: No results for input(s): "CKTOTAL", "CKMB", "CKMBINDEX", "TROPONINI" in the last 168 hours. BNP (last 3 results) No results for input(s): "PROBNP" in the last 8760 hours. HbA1C: No results for input(s): "HGBA1C" in the last 72 hours. CBG:  No results for input(s): "GLUCAP" in the last 168 hours. Lipid Profile: No results for input(s): "CHOL", "HDL", "LDLCALC", "TRIG", "CHOLHDL", "LDLDIRECT" in the last 72 hours. Thyroid Function Tests: No results for input(s): "TSH", "T4TOTAL", "FREET4", "T3FREE", "THYROIDAB" in the last 72 hours. Anemia Panel: No results for input(s): "VITAMINB12", "FOLATE", "FERRITIN", "TIBC", "IRON", "RETICCTPCT" in the last 72 hours. Urine analysis:    Component Value Date/Time   COLORURINE YELLOW 05/02/2020 0021   APPEARANCEUR CLEAR 05/02/2020 0021   LABSPEC 1.006 05/02/2020 0021   PHURINE 5.0 05/02/2020 0021   GLUCOSEU NEGATIVE 05/02/2020 0021   HGBUR SMALL (A) 05/02/2020 0021   BILIRUBINUR NEGATIVE 05/02/2020 0021   KETONESUR NEGATIVE 05/02/2020 0021   PROTEINUR NEGATIVE 05/02/2020 0021   NITRITE NEGATIVE 05/02/2020 0021   LEUKOCYTESUR NEGATIVE 05/02/2020 0021    Radiological Exams on Admission: CT Chest Wo Contrast  Result Date: 09/17/2021 CLINICAL DATA:  Abdominal pain, acute, nonlocalized; Chest trauma, blunt EXAM: CT CHEST, ABDOMEN AND PELVIS WITHOUT CONTRAST TECHNIQUE: Multidetector CT imaging of the chest, abdomen and pelvis was performed  following the standard protocol without IV contrast. RADIATION DOSE REDUCTION: This exam was performed according to the departmental dose-optimization program which includes automated exposure control, adjustment of the mA and/or kV according to patient size and/or use of iterative reconstruction technique. COMPARISON:  CT of the abdomen and pelvis dated November 21, 2020 and CT angio chest dated May 05, 2020 FINDINGS: CT CHEST FINDINGS Cardiovascular: Ascending thoracic aorta has a normal appearance. Moderately severe atheromatous calcifications of the thoracic aorta. Atheromatous calcifications of the coronary arteries as well. Normal heart size. No pericardial effusion. Mediastinum/Nodes: No enlarged mediastinal, hilar, or axillary lymph nodes. Thyroid gland, and trachea demonstrate no significant findings. Moderately large hiatal hernia. Lungs/Pleura: Lungs are clear except for some linear scarring at the lung bases greater on the right. No pleural effusion or pneumothorax. Musculoskeletal: There is lobulated soft tissue mass density seen in the right breast (image 27/2) measuring 3.8 x 2.7 cm. Moderate thoracic spondylosis. Mild decrease in the height of the T8 vertebral body measuring about 30% and is new since the previous study. CT ABDOMEN PELVIS FINDINGS Hepatobiliary: Small gallstones in the dependent gallbladder without cholecystitis. Liver has a normal appearance. Pancreas: Unremarkable. No pancreatic ductal dilatation or surrounding inflammatory changes. Spleen: Normal in size without focal abnormality. Adrenals/Urinary Tract: Adrenal glands are unremarkable. Kidneys are normal, without renal calculi, focal lesion, or hydronephrosis. Bladder is unremarkable. Stomach/Bowel: Most of the stomach has herniated into the hiatal hernia sac. Bowel-gas pattern is nonobstructive. Severe diverticulosis of the sigmoid colon without diverticulitis. Vascular/Lymphatic: Severe atheromatous calcifications of the  abdominal aorta and iliac arteries. No aneurysm. No significant abdominal/pelvic lymphadenopathy. Reproductive: Status post hysterectomy. There is a simple appearing cyst at the posteroinferior pelvis measuring 4.6 cm AP x 6 cm transverse in comparison to 3.9 x 5.3 cm on the previous study. Other: No abdominal wall hernia or abnormality. No abdominopelvic ascites. Musculoskeletal: Again seen is the vertebra plana with severe compression fracture of the T12. Mild anterolisthesis at L4-L5. Moderate thoracolumbar spondylosis. IMPRESSION: 1. Moderately severe thoracic and aortic atherosclerosis. Coronary artery atherosclerosis. 2.  Moderately large hiatal hernia, stable. 3. Tiny gallstones in the dependent gallbladder without cholecystitis. 4. Severe diverticulosis of the sigmoid colon without diverticulitis. 5. Status post hysterectomy. Simple appearing cyst at the inferior pelvis measuring 4.6 x 6 cm in comparison to 3.9 x 5.3 cm on the previous study likely an ovarian cyst. Suggest non-emergent ultrasound examination of the pelvis. 6. Mild compression  fracture of the T8 vertebral body with about 30% decrease in the height and is new since the previous study. Severe compression fracture of the T12 is stable. Electronically Signed   By: Frazier Richards M.D.   On: 09/17/2021 15:53   CT ABDOMEN PELVIS WO CONTRAST  Result Date: 09/17/2021 CLINICAL DATA:  Abdominal pain, acute, nonlocalized; Chest trauma, blunt EXAM: CT CHEST, ABDOMEN AND PELVIS WITHOUT CONTRAST TECHNIQUE: Multidetector CT imaging of the chest, abdomen and pelvis was performed following the standard protocol without IV contrast. RADIATION DOSE REDUCTION: This exam was performed according to the departmental dose-optimization program which includes automated exposure control, adjustment of the mA and/or kV according to patient size and/or use of iterative reconstruction technique. COMPARISON:  CT of the abdomen and pelvis dated November 21, 2020 and CT  angio chest dated May 05, 2020 FINDINGS: CT CHEST FINDINGS Cardiovascular: Ascending thoracic aorta has a normal appearance. Moderately severe atheromatous calcifications of the thoracic aorta. Atheromatous calcifications of the coronary arteries as well. Normal heart size. No pericardial effusion. Mediastinum/Nodes: No enlarged mediastinal, hilar, or axillary lymph nodes. Thyroid gland, and trachea demonstrate no significant findings. Moderately large hiatal hernia. Lungs/Pleura: Lungs are clear except for some linear scarring at the lung bases greater on the right. No pleural effusion or pneumothorax. Musculoskeletal: There is lobulated soft tissue mass density seen in the right breast (image 27/2) measuring 3.8 x 2.7 cm. Moderate thoracic spondylosis. Mild decrease in the height of the T8 vertebral body measuring about 30% and is new since the previous study. CT ABDOMEN PELVIS FINDINGS Hepatobiliary: Small gallstones in the dependent gallbladder without cholecystitis. Liver has a normal appearance. Pancreas: Unremarkable. No pancreatic ductal dilatation or surrounding inflammatory changes. Spleen: Normal in size without focal abnormality. Adrenals/Urinary Tract: Adrenal glands are unremarkable. Kidneys are normal, without renal calculi, focal lesion, or hydronephrosis. Bladder is unremarkable. Stomach/Bowel: Most of the stomach has herniated into the hiatal hernia sac. Bowel-gas pattern is nonobstructive. Severe diverticulosis of the sigmoid colon without diverticulitis. Vascular/Lymphatic: Severe atheromatous calcifications of the abdominal aorta and iliac arteries. No aneurysm. No significant abdominal/pelvic lymphadenopathy. Reproductive: Status post hysterectomy. There is a simple appearing cyst at the posteroinferior pelvis measuring 4.6 cm AP x 6 cm transverse in comparison to 3.9 x 5.3 cm on the previous study. Other: No abdominal wall hernia or abnormality. No abdominopelvic ascites. Musculoskeletal:  Again seen is the vertebra plana with severe compression fracture of the T12. Mild anterolisthesis at L4-L5. Moderate thoracolumbar spondylosis. IMPRESSION: 1. Moderately severe thoracic and aortic atherosclerosis. Coronary artery atherosclerosis. 2.  Moderately large hiatal hernia, stable. 3. Tiny gallstones in the dependent gallbladder without cholecystitis. 4. Severe diverticulosis of the sigmoid colon without diverticulitis. 5. Status post hysterectomy. Simple appearing cyst at the inferior pelvis measuring 4.6 x 6 cm in comparison to 3.9 x 5.3 cm on the previous study likely an ovarian cyst. Suggest non-emergent ultrasound examination of the pelvis. 6. Mild compression fracture of the T8 vertebral body with about 30% decrease in the height and is new since the previous study. Severe compression fracture of the T12 is stable. Electronically Signed   By: Frazier Richards M.D.   On: 09/17/2021 15:53    EKG: Independently reviewed.  Atrial fibrillation  Assessment/Plan Principal Problem:   AKI (acute kidney injury) ([place]) Active Problems:   Hyperlipidemia, mixed   Hypertension, essential, benign   Persistent atrial fibrillation (HCC)   Diarrhea   Hyponatremia   Metabolic acidosis   Traumatic compression fracture of T8 vertebra ([place])  Breast mass, right   Ovarian cyst     Acute kidney injury -Secondary to dehydration -No signs of obstructive process on CT imaging -Continue IV fluids and monitor urine output -Hold ARB  Normal anion gap metabolic acidosis -Likely secondary to loose stools  Diarrhea -Likely gastroenteritis -Stool studies have been ordered -She has not been on antibiotics recently  Hyponatremia -Related to dehydration and GI losses -Continue saline infusion  Persistent atrial fibrillation -Heart rate is currently stable on metoprolol -She is anticoagulated with Eliquis  Hypertension -Continuing on beta-blockers -Holding ARB for now in light of acute kidney  injury  Hyperlipidemia -Continue statin  T8 compression fracture -No weakness or numbness in her legs bilaterally -Continue pain management and PT/OT  Right breast mass Ovarian cyst -Outpatient work-up if patient desires  Fall -Likely secondary to dehydration and renal failure -PT/OT -She normally ambulates with a walker  DVT prophylaxis: Eliquis Code Status: Full code Family Communication: Discussed with patient Disposition Plan: Pending PT eval Consults called:   Admission status: Observation, telemetry  Kathie Dike MD [facility]   If 7PM-7AM, please contact night-coverage [email or web address]   09/17/2021, 6:34 PM

## 2021-09-17 NOTE — Progress Notes (Deleted)
ACUTE VISIT No chief complaint on file.  HPI: [name] is a 88 y.o. female, who is here today complaining of *** HPI  Review of Systems Rest see pertinent positives and negatives per HPI.  Current Outpatient Medications on File Prior to Visit  Medication Sig Dispense Refill  . acetaminophen (TYLENOL) 500 MG tablet Take 500 mg by mouth every 6 (six) hours as needed for moderate pain.    Marland Kitchen atorvastatin (LIPITOR) 40 MG tablet TAKE 1 TABLET EACH DAY. 90 tablet 3  . citalopram (CELEXA) 10 MG tablet TAKE ONE TABLET BY MOUTH ONCE DAILY 90 tablet 1  . Cyanocobalamin (VITAMIN B 12 PO) Take 500 mcg by mouth daily.    Marland Kitchen ELIQUIS 5 MG TABS tablet TAKE ONE TABLET BY MOUTH TWICE DAILY 60 tablet 6  . Ferrous Gluconate 324 (37.5 Fe) MG TABS Take 1 tablet by mouth daily.    . furosemide (LASIX) 80 MG tablet Take 1 tablet (80 mg total) by mouth daily. 90 tablet 3  . LORazepam (ATIVAN) 0.5 MG tablet TAKE 1/2 TABLET AT BEDTIME AS NEEDED FOR ANXIETY. 30 tablet 3  . losartan (COZAAR) 100 MG tablet Take 1 tablet (100 mg total) by mouth daily. 90 tablet 3  . magnesium oxide (MAG-OX) 400 (241.3 Mg) MG tablet Take 0.5 tablets (200 mg total) by mouth daily. (Patient taking differently: Take 400 mg by mouth daily.)    . metoprolol succinate (TOPROL-XL) 25 MG 24 hr tablet TAKE ONE TABLET BY MOUTH ONCE DAILY 90 tablet 2  . Multiple Vitamin (MULTIVITAMIN WITH MINERALS) TABS tablet Take 1 tablet by mouth daily.    . pantoprazole (PROTONIX) 40 MG tablet Take 1 tablet (40 mg total) by mouth daily. 90 tablet 2  . spironolactone (ALDACTONE) 25 MG tablet Take 1 tablet (25 mg total) by mouth daily. 90 tablet 3   No current facility-administered medications on file prior to visit.     Past Medical History:  Diagnosis Date  . Anemia 04/2020  . Anxiety   . Arthritis   . Chicken pox   . COVID-19   . Heart murmur   . Hyperlipidemia   . Hypertension    No Known Allergies  Social History    Socioeconomic History  . Marital status: Widowed    Spouse name: Not on file  . Number of children: 2  . Years of education: Not on file  . Highest education level: Not on file  Occupational History  . Not on file  Tobacco Use  . Smoking status: Never  . Smokeless tobacco: Never  Vaping Use  . Vaping Use: Never used  Substance and Sexual Activity  . Alcohol use: Not Currently    Alcohol/week: 7.0 standard drinks of alcohol    Types: 7 Glasses of wine per week    Comment: Occasional  . Drug use: No  . Sexual activity: Not Currently  Other Topics Concern  . Not on file  Social History Narrative  . Not on file   Social Determinants of Health   Financial Resource Strain: Low Risk  (03/26/2021)   Overall Financial Resource Strain (CARDIA)   . Difficulty of Paying Living Expenses: Not hard at all  Food Insecurity: No Food Insecurity (03/26/2021)   Hunger Vital Sign   . Worried About Charity fundraiser in the Last Year: Never true   . Ran Out of Food in the Last Year: Never true  Transportation Needs: No Transportation Needs (03/26/2021)   PRAPARE -  Transportation   . Lack of Transportation (Medical): No   . Lack of Transportation (Non-Medical): No  Physical Activity: Inactive (03/26/2021)   Exercise Vital Sign   . Days of Exercise per Week: 0 days   . Minutes of Exercise per Session: 0 min  Stress: No Stress Concern Present (03/26/2021)   [place]   . Feeling of Stress : Not at all  Social Connections: Not on file    There were no vitals filed for this visit. There is no height or weight on file to calculate BMI.  Physical Exam  ASSESSMENT AND PLAN:  There are no diagnoses linked to this encounter.   No follow-ups on file.   [name]. [name], MD  [facility] [place] office.  Discharge Instructions   None

## 2021-09-17 NOTE — Telephone Encounter (Signed)
Pt's neighbor called in stating patient was not feeling well, blood pressure was 103/53. Has appointment today but says patient didn't feel as if she could wait for the appointment. Transf to triage for guidance

## 2021-09-17 NOTE — Telephone Encounter (Signed)
[facility] - Client TELEPHONE ADVICE RECORD [facility] Patient Name: Linda Glass T Gender: Female DOB: 1933/10/16 Age: 88 Y 1 M 10 D Return Phone Number: [phone] (Primary) Address: City/ State/ Zip: [place]  [address] Client [facility] - Client Client Site [place] - Day Provider [name], Betty - MD Contact Type Call Who Is Calling Patient / Member / Family / Caregiver Call Type Triage / Clinical Caller Name Seymour Bars Relationship To Patient Friend Return Phone Number [phone] (Primary) Chief Complaint Blood Pressure Low Reason for Call Symptomatic / Request for [place] states that her friend has an appt today at 4pm, she is not feeling well at this time. Her blood pressure is low at 103/53, dizzy, and nausea. No fever at this time. Translation No Nurse Assessment Nurse: Ellery Plunk, RN, Danica Date/Time (Eastern Time): 09/17/2021 12:16:04 PM Confirm and document reason for call. If symptomatic, describe symptoms. ---Caller states her friend is not feeling well. BP is 103/53. Is dizzy and nauseas. Is on BP meds and recently had meds adjusted. Has an appt today at [facility] on Friday and hurt her back. Does the patient have any new or worsening symptoms? ---Yes Will a triage be completed? ---Yes Related visit to physician within the last 2 weeks? ---No Does the PT have any chronic conditions? (i.e. diabetes, asthma, this includes High risk factors for pregnancy, etc.) ---Yes List chronic conditions. ---htn, on eliquis Is this a behavioral health or substance abuse call? ---No Guidelines Guideline Title Affirmed Question Affirmed Notes Nurse Date/Time (Eastern Time) Cold Sores (Fever Blisters) [1] Red streak or red area spreading from cold sore AND [2] no fever Bringas, RN, Danica 09/17/2021 12:19:59 PM Nausea Difficulty breathing  Bringas, RN, Danica 09/17/2021 12:21:44 PM PLEASE NOTE: All timestamps contained within this report are represented as [place] Standard Time. CONFIDENTIALTY NOTICE: This fax transmission is intended only for the addressee. It contains information that is legally privileged, confidential or otherwise protected from use or disclosure. If you are not the intended recipient, you are strictly prohibited from reviewing, disclosing, copying using or disseminating any of this information or taking any action in reliance on or regarding this information. If you have received this fax in error, please notify us immediately by telephone so that we can arrange for its return to [place]. Phone: [phone], Toll-Free: [phone], Fax: [phone] Page: 2 of 2 Call Id: [id number] [place]. Time Eilene Ghazi Time) Disposition Final User 09/17/2021 12:21:13 PM See PCP within 24 Hours Bringas, RN, Fredric Dine 09/17/2021 12:23:29 PM Go to ED Now Yes Ellery Plunk, RN, Fredric Dine Final Disposition 09/17/2021 12:23:29 PM Go to ED Now Yes Bringas, RN, Danica Caller Disagree/Comply Comply Caller Understands Yes PreDisposition InappropriateToAsk Care Advice Given Per Guideline SEE PCP WITHIN 24 HOURS: * IF OFFICE WILL BE OPEN: You need to be examined within the next 24 hours. Call your doctor (or NP/PA) when the office opens and make an appointment. CLEANSING: * Wash the infected area with warm water and an antibacterial soap. ANTIBIOTIC OINTMENT - INFECTED AREA: * Put a small amount of antibiotic ointment on the infected area 3 times a day. CALL BACK IF: * Fever occurs * You become worse CARE ADVICE given per Fever Blisters of Lip (Cold Sores) (Adult) guideline. GO TO ED NOW: * You need to be seen in the Emergency Department. * Leave now. Drive carefully. CARE ADVICE given per Nausea (Adult) guideline. ANOTHER ADULT SHOULD DRIVE: * It is better  and safer if another adult drives instead of you. Comments User: Harriette Bouillon, RN Date/Time  Eilene Ghazi Time): 09/17/2021 12:18:04 PM 124/50 was her BP yesterday User: Harriette Bouillon, RN Date/Time Eilene Ghazi Time): 09/17/2021 12:20:43 PM has sores on her mouth and some are bleeding User: Harriette Bouillon, RN Date/Time Eilene Ghazi Time): 09/17/2021 12:21:42 PM has had 2-3 loose stools today Referrals [facility] - ED

## 2021-09-18 ENCOUNTER — Other Ambulatory Visit: Payer: Self-pay

## 2021-09-18 DIAGNOSIS — E860 Dehydration: Secondary | ICD-10-CM | POA: Diagnosis not present

## 2021-09-18 DIAGNOSIS — N179 Acute kidney failure, unspecified: Secondary | ICD-10-CM | POA: Diagnosis not present

## 2021-09-18 DIAGNOSIS — N83209 Unspecified ovarian cyst, unspecified side: Secondary | ICD-10-CM

## 2021-09-18 DIAGNOSIS — S22000A Wedge compression fracture of unspecified thoracic vertebra, initial encounter for closed fracture: Secondary | ICD-10-CM | POA: Diagnosis not present

## 2021-09-18 LAB — OSMOLALITY: Osmolality: 285 mOsm/kg (ref 275–295)

## 2021-09-18 LAB — CBC
HCT: 32 % — ABNORMAL LOW (ref 36.0–46.0)
Hemoglobin: 10.9 g/dL — ABNORMAL LOW (ref 12.0–15.0)
MCH: 37.3 pg — ABNORMAL HIGH (ref 26.0–34.0)
MCHC: 34.1 g/dL (ref 30.0–36.0)
MCV: 109.6 fL — ABNORMAL HIGH (ref 80.0–100.0)
Platelets: 92 10*3/uL — ABNORMAL LOW (ref 150–400)
RBC: 2.92 MIL/uL — ABNORMAL LOW (ref 3.87–5.11)
RDW: 12.3 % (ref 11.5–15.5)
WBC: 5.2 10*3/uL (ref 4.0–10.5)
nRBC: 0 % (ref 0.0–0.2)

## 2021-09-18 LAB — COMPREHENSIVE METABOLIC PANEL
ALT: 19 U/L (ref 0–44)
AST: 25 U/L (ref 15–41)
Albumin: 3.2 g/dL — ABNORMAL LOW (ref 3.5–5.0)
Alkaline Phosphatase: 76 U/L (ref 38–126)
Anion gap: 6 (ref 5–15)
BUN: 41 mg/dL — ABNORMAL HIGH (ref 8–23)
CO2: 19 mmol/L — ABNORMAL LOW (ref 22–32)
Calcium: 9.3 mg/dL (ref 8.9–10.3)
Chloride: 106 mmol/L (ref 98–111)
Creatinine, Ser: 1.08 mg/dL — ABNORMAL HIGH (ref 0.44–1.00)
GFR, Estimated: 50 mL/min — ABNORMAL LOW (ref >60)
Glucose, Bld: 90 mg/dL (ref 70–99)
Potassium: 4.5 mmol/L (ref 3.5–5.1)
Sodium: 131 mmol/L — ABNORMAL LOW (ref 135–145)
Total Bilirubin: 1.4 mg/dL — ABNORMAL HIGH (ref 0.3–1.2)
Total Protein: 6.1 g/dL — ABNORMAL LOW (ref 6.5–8.1)

## 2021-09-18 LAB — TSH: TSH: 1.104 u[IU]/mL (ref 0.350–4.500)

## 2021-09-18 NOTE — Evaluation (Signed)
Physical Therapy Evaluation CLINICAL IMPRESSION Pt presents with the problems and impairments listed in the 2nd table below. Pt supine in bed reporting no pain, agreeable to be seen but reporting some dizziness though denies vertigo. Orthostatic vitals taken (see first table below) and were negative despite symptomatic report. Required supervision for bed mobility (pt mildly impulsive), min guard for transfers, upon standing pt reporting dizziness and immediately return to sitting, though was able to stand for BP measurement. Attempted ambulation but pt elected to defer; pt completed step pivot transfer to recliner with min guard and max Vcs for proximity to device as pt was "furniture surfing" with one hand. Currently recommending short-term SNF-level therapies upon discharge pending pt progress, we will continue to follow acutely.  Orthostatic vitals  09/18/21 0115  Orthostatic Lying   BP- Lying 125/60  Pulse- Lying 60  Orthostatic Sitting  BP- Sitting 121/74  Pulse- Sitting 61  Orthostatic Standing at 0 minutes  BP- Standing at 0 minutes 118/71  Pulse- Standing at 0 minutes 65     Evaluation Table  09/18/21 1401  PT Visit Information  Last PT Received On 09/18/21  Assistance Needed +1  History of Present Illness patient  is an 88 year old female [facility] ED [facility] after a fall at home several days ago; also reports weakness and diarrhea of 3 days; CT of abdomen revealed Severe diverticulosis of the sigmoid colon without diverticulitis and severe T8compression fx with 30% reduction of height since previous study; stable T12 compression fx.  PMH: AKI, HLD, HTN, PAF, anemia, anxiety.  Precautions  Precautions Fall  Precaution Comments encourage back precautions  Restrictions  Weight Bearing Restrictions No  Home Living  Family/patient expects to be discharged to: Private residence  Living Arrangements Alone  Available Help at Discharge Neighbor;Available PRN/intermittently   Type of Home House  Home Access Level entry  [place] to live on main level with bedroom/bathroom  Bathroom Shower/Tub Walk-in Buyer, retail (2 wheels)  Prior Function  Prior Level of Function  Independent/Modified Independent  Mobility Comments uses RW at all times  ADLs Comments Maid helps with cleaning house, neighbor helps with shopping, otherwise IND for ADLS  Communication  Communication HOH  Pain Assessment  Pain Assessment No/denies pain  Cognition  Arousal/Alertness Awake/alert  Behavior During Therapy WFL for tasks assessed/performed  Overall Cognitive Status Within Functional Limits for tasks assessed  General Comments impulsive but very plesant and motivated  Upper Extremity Assessment  Upper Extremity Assessment Overall WFL for tasks assessed  Lower Extremity Assessment  Lower Extremity Assessment RLE deficits/detail;LLE deficits/detail  RLE Deficits / Details MMT grossly 4/5  RLE Sensation WNL  LLE Deficits / Details MMT grossly 4/5  LLE Sensation WNL  Cervical / Trunk Assessment  Cervical / Trunk Assessment Other exceptions  Cervical / Trunk Exceptions T8, T12 compression fx  Bed Mobility  Overal bed mobility Needs Assistance  Bed Mobility Supine to Sit  Supine to sit Supervision  General bed mobility comments for safety only, no physical assist required  Transfers  Overall transfer level Needs assistance  Transfers Sit to/from Stand;Bed to chair/wheelchair/BSC  Sit to Stand Min guard  Bed to/from chair/wheelchair/BSC transfer type: Step pivot  Step pivot transfers Min guard  General transfer comment Min guard for safety only, no physical assist required. Pt reporting dizziness with positional changes but denies vertigo Vitals taken in sitting and standing, see vitals flowsheet, negative for orthostatics.  Encouraged pt to attempt ambulation but pt requesting to defer citing dizziness,  completed step pivot transfer to chair with min guard, max VCs for proxmity to device as pt was "furniture surfing" using recliner armrest and bedrail with one hand and one hand on RW.  Ambulation/Gait  General Gait Details deferred  Balance  Overall balance assessment Needs assistance  Sitting-balance support Feet supported;No upper extremity supported  Sitting balance-Leahy Scale Good  Standing balance support Single extremity supported;During functional activity  Standing balance-Leahy Scale Fair  Standing balance comment Able to perform static stance with single UE support on RW  PT - End of Session  Equipment Utilized During Treatment Gait belt  Activity Tolerance Patient tolerated treatment well;No increased pain  Patient left in chair;with call bell/phone within reach;with chair alarm set  Nurse Communication Mobility status  PT Assessment  PT Recommendation/Assessment Patient needs continued PT services  PT Visit Diagnosis Difficulty in walking, not elsewhere classified (R26.2);History of falling (Z91.81)  PT Problem List Decreased strength;Decreased range of motion;Decreased activity tolerance;Decreased balance;Decreased mobility;Decreased coordination;Decreased knowledge of use of DME  PT Plan  PT Frequency (ACUTE ONLY) Min 2X/week  PT Treatment/Interventions (ACUTE ONLY) DME instruction;Gait training;Stair training;Functional mobility training;Therapeutic activities;Therapeutic exercise;Balance training;Neuromuscular re-education;Patient/family education  AM-PAC PT "6 Clicks" Mobility Outcome Measure (Version 2)  Help needed turning from your back to your side while in a flat bed without using bedrails? 4  Help needed moving from lying on your back to sitting on the side of a flat bed without using bedrails? 3  Help needed moving to and from a bed to a chair (including a wheelchair)? 3  Help needed standing up from a chair using your arms (e.g., wheelchair or bedside chair)? 3   Help needed to walk in hospital room? 3  Help needed climbing 3-5 steps with a railing?  2  6 Click Score 18  Consider Recommendation of Discharge To: Home with [facility]  Progressive Mobility  What is the highest level of mobility based on the progressive mobility assessment? Level 4 (Walks with assist in room) - Balance while marching in place and cannot step forward and back - Complete  Activity Stood at bedside;Transferred from bed to chair  PT Recommendation  Follow Up Recommendations Skilled nursing-short term rehab (<3 hours/day) (Pending progress, pt has expressed interest in [place])  Can patient physically be transported by private vehicle Yes  Assistance recommended at discharge Set up Supervision/Assistance  Patient can return home with the following A little help with walking and/or transfers;A little help with bathing/dressing/bathroom;Assistance with cooking/housework;Assist for transportation;Help with stairs or ramp for entrance  Functional Status Assessment Patient has had a recent decline in their functional status and demonstrates the ability to make significant improvements in function in a reasonable and predictable amount of time.  PT equipment None recommended by PT (TBD)  Individuals Consulted  Consulted and Agree with Results and Recommendations Patient  Acute Rehab PT Goals  Patient Stated Goal To reduce falls  PT Goal Formulation With patient  Time For Goal Achievement 10/02/21  Potential to Achieve Goals Good  PT Time Calculation  PT Start Time (ACUTE ONLY) 1110  PT Stop Time (ACUTE ONLY) 1135  PT Time Calculation (min) (ACUTE ONLY) 25 min  PT General Charges  $$ ACUTE PT VISIT 1 Visit  PT Evaluation  $PT Eval Low Complexity 1 Low  PT Treatments  $Therapeutic Activity 8-22 mins     Coolidge Breeze, PT, DPT [facility] Department Office: [phone] Pager: [phone]  Coolidge Breeze 09/18/2021, 2:20 PM

## 2021-09-18 NOTE — NC FL2 (Signed)
[place] LEVEL OF CARE SCREENING TOOL     IDENTIFICATION  Patient Name: Linda Glass Birthdate: Aug 03, 1933 Sex: female Admission Date (Current Location): 09/17/2021  [facility] and [place] Number:  Herbalist and Address:  [facility],  [place], [place]      Provider Number: [id number]  Attending Physician Name and Address:  Kathie Dike, MD  Relative Name and Phone Number:  Daughter, Nada Boozer [phone])    Current Level of Care: Hospital Recommended Level of Care: [place] Prior Approval Number:    Date Approved/Denied:   PASRR Number: [id number]  Discharge Plan: SNF    Current Diagnoses: Patient Active Problem List   Diagnosis Date Noted   AKI (acute kidney injury) ([place]) 09/17/2021   Diarrhea 09/17/2021   Hyponatremia 01/60/1093   Metabolic acidosis 23/55/7322   Traumatic compression fracture of T8 vertebra ([place]) 09/17/2021   Breast mass, right 09/17/2021   Ovarian cyst 09/17/2021   Colonic mass 03/12/2021   Atherosclerosis of aorta ([place]) 01/05/2021   Multiple gastric ulcers    Cecum mass    2019 novel coronavirus disease (COVID-19) 05/02/2020   Persistent atrial fibrillation ([place]) 05/01/2020   Hypertension, essential, benign 07/05/2019   Anxiety disorder 07/05/2019   Insomnia disorder 08/21/2016   Class 1 obesity 08/21/2016   Hyperlipidemia, mixed 08/21/2016    Orientation RESPIRATION BLADDER Height & Weight     Self, Time, Situation, Place  Normal Continent Weight:   Height:     BEHAVIORAL SYMPTOMS/MOOD NEUROLOGICAL BOWEL NUTRITION STATUS      Continent Diet (Regular)  AMBULATORY STATUS COMMUNICATION OF NEEDS Skin   Limited Assist Verbally Normal                       Personal Care Assistance Level of Assistance  Bathing, Dressing, Feeding Bathing Assistance: Limited assistance Feeding assistance: Limited assistance Dressing Assistance: Limited  assistance     Functional Limitations Info  Sight, Hearing, Speech Sight Info: Adequate Hearing Info: Impaired Speech Info: Adequate    SPECIAL CARE FACTORS FREQUENCY  PT (By licensed PT), OT (By licensed OT)     PT Frequency: 5x/wk OT Frequency: 5x/wk            Contractures Contractures Info: Not present    Additional Factors Info  Code Status, Allergies, Psychotropic Code Status Info: FULL Allergies Info: No Known Allergies Psychotropic Info: See MAR         Current Medications (09/18/2021):  This is the current hospital active medication list Current Facility-Administered Medications  Medication Dose Route Frequency Provider Last Rate Last Admin   0.9 %  sodium chloride infusion   Intravenous Continuous Kathie Dike, MD 125 mL/hr at 09/18/21 1003 New Bag at 09/18/21 1003   acetaminophen (TYLENOL) tablet 650 mg  650 mg Oral Q6H PRN Kathie Dike, MD   650 mg at 09/18/21 1002   Or   acetaminophen (TYLENOL) suppository 650 mg  650 mg Rectal Q6H PRN Kathie Dike, MD       apixaban (ELIQUIS) tablet 5 mg  5 mg Oral BID Kathie Dike, MD   5 mg at 09/18/21 1002   atorvastatin (LIPITOR) tablet 40 mg  40 mg Oral Daily Kathie Dike, MD   40 mg at 09/18/21 1002   citalopram (CELEXA) tablet 10 mg  10 mg Oral Daily Kathie Dike, MD   10 mg at 09/18/21 1002   hydrALAZINE (APRESOLINE) injection 10 mg  10 mg  Intravenous Q6H PRN Kathie Dike, MD       HYDROcodone-acetaminophen (NORCO/VICODIN) 5-325 MG per tablet 1-2 tablet  1-2 tablet Oral Q4H PRN Kathie Dike, MD       LORazepam (ATIVAN) tablet 0.25 mg  0.25 mg Oral QHS PRN Kathie Dike, MD   0.25 mg at 09/17/21 2047   metoprolol succinate (TOPROL-XL) 24 hr tablet 50 mg  50 mg Oral Daily Kathie Dike, MD   50 mg at 09/18/21 1002   morphine (PF) 2 MG/ML injection 2 mg  2 mg Intravenous Q2H PRN Kathie Dike, MD       ondansetron (ZOFRAN) tablet 4 mg  4 mg Oral Q6H PRN Kathie Dike, MD       Or    ondansetron (ZOFRAN) injection 4 mg  4 mg Intravenous Q6H PRN Kathie Dike, MD       pantoprazole (PROTONIX) EC tablet 40 mg  40 mg Oral Daily Kathie Dike, MD   40 mg at 09/18/21 1002     Discharge Medications: Please see discharge summary for a list of discharge medications.  Relevant Imaging Results:  Relevant Lab Results:   Additional Information SSN [id number]  Vassie Moselle, LCSW

## 2021-09-18 NOTE — TOC Initial Note (Signed)
Transition of Care [facility]) - Initial/Assessment Note    Patient Details  Name: Linda Glass MRN: [id number] Date of Birth: 29-Oct-1933  [facility] CM/SW Contact:    Vassie Moselle, LCSW Phone Number: 09/18/2021, 2:40 PM  Clinical Narrative:                 Met with pt and confirmed plans for SNF placement. Pt states that [facility] is her top choice for placement. She requests that her friend Seymour Bars be her main point of contact. CSW has faxed referrals for SNF placement and currently awaiting bed offers.   Expected Discharge Plan: Skilled Nursing Facility Barriers to Discharge: Continued Medical Work up   Patient Goals and CMS Choice Patient states their goals for this hospitalization and ongoing recovery are:: To return home   Choice offered to / list presented to : Patient  Expected Discharge Plan and Services Expected Discharge Plan: [place] In-house Referral: Clinical Social Work Discharge Planning Services: CM Consult Post Acute Care Choice: [place] arrangements for the past 2 months: Single Family Home                 DME Arranged: N/A DME Agency: NA                  Prior Living Arrangements/Services Living arrangements for the past 2 months: Single Family Home Lives with:: Self   Do you feel safe going back to the place where you live?: Yes      Need for Family Participation in Patient Care: No (Comment) Care giver support system in place?: No (comment) Current home services: DME Criminal Activity/Legal Involvement Pertinent to Current Situation/Hospitalization: No - Comment as needed  Activities of Daily Living Home Assistive Devices/Equipment: Eyeglasses, Environmental consultant (specify type) ADL Screening (condition at time of admission) Patient's cognitive ability adequate to safely complete daily activities?: Yes Is the patient deaf or have difficulty hearing?: Yes Does the patient have  difficulty seeing, even when wearing glasses/contacts?: No Does the patient have difficulty concentrating, remembering, or making decisions?: No Patient able to express need for assistance with ADLs?: Yes Does the patient have difficulty dressing or bathing?: No Independently performs ADLs?: No Communication: Independent Dressing (OT): Independent Grooming: Independent Feeding: Independent Bathing: Independent Toileting: Needs assistance Is this a change from baseline?: Pre-admission baseline In/Out Bed: Needs assistance Is this a change from baseline?: Pre-admission baseline Walks in Home: Needs assistance Is this a change from baseline?: Pre-admission baseline Does the patient have difficulty walking or climbing stairs?: Yes Weakness of Legs: Both Weakness of Arms/Hands: Both  Permission Sought/Granted Permission sought to share information with : Facility Sport and exercise psychologist, Other (comment) (Friend) Permission granted to share information with : Yes, Verbal Permission Granted  Share Information with NAME: Seymour Bars     Permission granted to share info w Relationship: Friend  Permission granted to share info w Contact Information: [phone]  Emotional Assessment Appearance:: Appears stated age Attitude/Demeanor/Rapport: Engaged, Ambitious Affect (typically observed): Accepting, Pleasant Orientation: : Oriented to Self, Oriented to Place, Oriented to  Time, Oriented to Situation Alcohol / Substance Use: Not Applicable Psych Involvement: No (comment)  Admission diagnosis:  Dehydration [E86.0] Hyponatremia [E87.1] Nausea [R11.0] AKI (acute kidney injury) ([place]) [N17.9] Compression fracture of body of thoracic vertebra ([place]) [S22.000A] Cyst of ovary, unspecified laterality [N83.209] Diarrhea, unspecified type [R19.7] Patient Active Problem List   Diagnosis Date Noted   AKI (acute kidney injury) ([place]) 09/17/2021   Diarrhea 09/17/2021  Hyponatremia 88/35/8446    Metabolic acidosis 52/08/6189   Traumatic compression fracture of T8 vertebra ([place]) 09/17/2021   Breast mass, right 09/17/2021   Ovarian cyst 09/17/2021   Colonic mass 03/12/2021   Atherosclerosis of aorta ([place]) 01/05/2021   Multiple gastric ulcers    Cecum mass    2019 novel coronavirus disease (COVID-19) 05/02/2020   Persistent atrial fibrillation ([place]) 05/01/2020   Hypertension, essential, benign 07/05/2019   Anxiety disorder 07/05/2019   Insomnia disorder 08/21/2016   Class 1 obesity 08/21/2016   Hyperlipidemia, mixed 08/21/2016   PCP:  [name], [name], MD Pharmacy:   [place], [place] [address] Phone: [phone] Fax: [phone]     Social Determinants of Health (SDOH) Interventions    Readmission Risk Interventions     No data to display

## 2021-09-18 NOTE — Evaluation (Addendum)
Occupational Therapy Evaluation Patient Details Name: Linda Glass MRN: [id number] DOB: 1933-08-10 Today's Date: 09/18/2021   History of Present Illness patient  is an 88 year old female presenting to [facility] ED on 7/24 after a fall at home several days ago; also reports weakness and diarrhea of 3 days; CT of abdomen revealed Severe diverticulosis of the sigmoid colon without diverticulitis and severe T8compression fx with 30% reduction of height since previous study; stable T12 compression fx.  PMH: AKI, HLD, HTN, PAF, anemia, anxiety.   Clinical Impression   Patient is a 88 year old female who was admitted for above. Patients eval was limited with onset of dizziness and pain in back with standing. Patient was highly motivated to move but noted to need increased cues to slow down and be mindful of movements to maintain back precautions to limit pain. Patient was noted to have decreased functional activity tolerance, decreased endurance, decreased standing balance, decreased safety awareness, and decreased knowledge of AD/AE impacting participation in ADLs. Patient would continue to benefit from skilled OT services at this time while admitted and after d/c to address noted deficits in order to improve overall safety and independence in ADLs.        Recommendations for follow up therapy are one component of a multi-disciplinary discharge planning process, led by the attending physician.  Recommendations may be updated based on patient status, additional functional criteria and insurance authorization.   Follow Up Recommendations  Skilled nursing-short term rehab (<3 hours/day)    Assistance Recommended at Discharge Frequent or constant Supervision/Assistance  Patient can return home with the following A little help with walking and/or transfers;A little help with bathing/dressing/bathroom;Assistance with cooking/housework;Direct supervision/assist for financial management;Assist for  transportation;Help with stairs or ramp for entrance;Direct supervision/assist for medications management    Functional Status Assessment  Patient has had a recent decline in their functional status and demonstrates the ability to make significant improvements in function in a reasonable and predictable amount of time.  Equipment Recommendations       Recommendations for Other Services       Precautions / Restrictions Precautions Precautions: Fall Precaution Comments: encourage back precautions Restrictions Weight Bearing Restrictions: No      Mobility Bed Mobility Overal bed mobility: Needs Assistance Bed Mobility: Supine to Sit, Sit to Supine     Supine to sit: Min guard Sit to supine: Min guard   General bed mobility comments: with education on log rolling to avoid straining back. educated on slowing down with patient very motivated to get out of bed with quick movements.    Transfers                          Balance Overall balance assessment: Mild deficits observed, not formally tested                                         ADL either performed or assessed with clinical judgement   ADL Overall ADL's : Needs assistance/impaired Eating/Feeding: Set up;Sitting   Grooming: Set up;Sitting   Upper Body Bathing: Set up;Sitting   Lower Body Bathing: Moderate assistance;Sitting/lateral leans   Upper Body Dressing : Set up;Sitting   Lower Body Dressing: Maximal assistance;Sit to/from stand;Sitting/lateral leans     Toilet Transfer Details (indicate cue type and reason): patient attempted standing with min A with RW and onset  of pain and dizziness. unable to participate in transfer with patient transitioned back to bed at this time. no BP machine in room to check BP. communiacted with PT to assess later during their eval. Toileting- Clothing Manipulation and Hygiene: Maximal assistance;Sit to/from stand               Vision  Patient Visual Report: No change from baseline       Perception     Praxis      Pertinent Vitals/Pain Pain Assessment Pain Assessment: No/denies pain     Hand Dominance     Extremity/Trunk Assessment Upper Extremity Assessment Upper Extremity Assessment: Overall WFL for tasks assessed   Lower Extremity Assessment Lower Extremity Assessment: RLE deficits/detail;LLE deficits/detail RLE Deficits / Details: MMT grossly 4/5 RLE Sensation: WNL LLE Deficits / Details: MMT grossly 4/5 LLE Sensation: WNL   Cervical / Trunk Assessment Cervical / Trunk Assessment: Other exceptions Cervical / Trunk Exceptions: T8, T12 compression fx   Communication Communication Communication: HOH   Cognition Arousal/Alertness: Awake/alert Behavior During Therapy: WFL for tasks assessed/performed Overall Cognitive Status: Within Functional Limits for tasks assessed                                 General Comments: impulsive but very plesant and motivated     General Comments       Exercises     Shoulder Instructions      Home Living Family/patient expects to be discharged to:: Private residence Living Arrangements: Alone Available Help at Discharge: Neighbor;Available PRN/intermittently Type of Home: House Home Access: Level entry     Home Layout: Able to live on main level with bedroom/bathroom     Bathroom Shower/Tub: Occupational psychologist: Standard     Home Equipment: Advice worker (2 wheels)          Prior Functioning/Environment Prior Level of Function : Independent/Modified Independent             Mobility Comments: uses RW at all times ADLs Comments: Maid helps with cleaning house, neighbor helps with shopping, otherwise IND for ADLS        OT Problem List: Decreased activity tolerance;Impaired balance (sitting and/or standing);Decreased safety awareness;Cardiopulmonary status limiting activity;Decreased knowledge of  precautions;Decreased knowledge of use of DME or AE;Pain      OT Treatment/Interventions: Self-care/ADL training;Therapeutic exercise;Neuromuscular education;Energy conservation;DME and/or AE instruction;Therapeutic activities;Patient/family education;Balance training    OT Goals(Current goals can be found in the care plan section) Acute Rehab OT Goals Patient Stated Goal: to get better OT Goal Formulation: With patient Time For Goal Achievement: 10/02/21 Potential to Achieve Goals: Fair  OT Frequency: Min 2X/week    Co-evaluation              AM-PAC OT "6 Clicks" Daily Activity     Outcome Measure Help from another person eating meals?: None Help from another person taking care of personal grooming?: A Little Help from another person toileting, which includes using toliet, bedpan, or urinal?: A Lot Help from another person bathing (including washing, rinsing, drying)?: A Lot Help from another person to put on and taking off regular upper body clothing?: A Little Help from another person to put on and taking off regular lower body clothing?: A Lot 6 Click Score: 16   End of Session Equipment Utilized During Treatment: Rolling walker (2 wheels) Nurse Communication: Mobility status  Activity Tolerance: Other (comment) (limited  by dizziness) Patient left:    OT Visit Diagnosis: Unsteadiness on feet (R26.81);Muscle weakness (generalized) (M62.81);Other abnormalities of gait and mobility (R26.89)                Time: 0404-5913 OT Time Calculation (min): 20 min Charges:  OT General Charges $OT Visit: 1 Visit OT Evaluation $OT Eval Moderate Complexity: 1 Mod  Jackelyn Poling OTR/L, MS Acute Rehabilitation Department Office# [phone] Pager# [phone]   Linda Glass 09/18/2021, 2:14 PM

## 2021-09-18 NOTE — Progress Notes (Signed)
PROGRESS NOTE    Sharnelle Cappelli  UEA:[id number] DOB: [birth year] DOA: 09/17/2021 PCP: [name], [name], MD    Brief Narrative:  88 year old female with a history of atrial fibrillation, hypertension, hyperlipidemia, lives alone, ambulates with a walker, has been having diarrhea for approximately 3 days prior to admission.  She presented with dehydration, acute kidney injury and she suffered a fall prior to admission.  She was started on IV fluids and overall renal functions improving.  Seen by physical therapy with recommendations for skilled nursing facility.   Assessment & Plan:   Principal Problem:   AKI (acute kidney injury) ([place]) Active Problems:   Hyperlipidemia, mixed   Hypertension, essential, benign   Persistent atrial fibrillation (HCC)   Diarrhea   Hyponatremia   Metabolic acidosis   Traumatic compression fracture of T8 vertebra (HCC)   Breast mass, right   Ovarian cyst   Acute kidney injury -Secondary to dehydration -No signs of obstructive process on CT imaging -Creatinine 1.9 on admission -This has improved to 1.08 with IV fluids, although BUN still remains elevated and she still dizzy on standing -Continue IV fluids and monitor urine output -Hold ARB   Normal anion gap metabolic acidosis -Likely secondary to loose stools -Improving with IV fluids   Diarrhea -Likely gastroenteritis -Stool studies have been ordered -She has not been on antibiotics recently   Hyponatremia -Related to dehydration and GI losses -Improved with saline   Persistent atrial fibrillation -Heart rate is currently stable on metoprolol -She is anticoagulated with Eliquis   Hypertension -Continuing on beta-blockers -Holding ARB for now in light of acute kidney injury -Blood pressure currently stable   Hyperlipidemia -Continue statin   T8 compression fracture -No weakness or numbness in her legs bilaterally -Continue pain management and PT/OT   Right breast  mass Ovarian cyst -Outpatient work-up if patient desires   Fall -Likely secondary to dehydration and renal failure -PT with recommendations for SNF placement -TOC following -She normally ambulates with a walker   DVT prophylaxis:  apixaban (ELIQUIS) tablet 5 mg  Code Status: Full code Family Communication: Discussed with patient Disposition Plan: Status is: Inpatient The patient will require care spanning > 2 midnights and should be moved to inpatient because: Needs continued IV fluids and likely placement     Consultants:    Procedures:    Antimicrobials:      Subjective: She feels a little better today.  Still dizzy with walking.  Tolerating p.o. intake.  Reports having 2 bowel movements today.  Objective: Vitals:   09/18/21 0153 09/18/21 0534 09/18/21 0957 09/18/21 1316  BP: (!) 114/98 136/69 122/61 (!) 126/52  Pulse: 63 (!) 53 65 (!) 54  Resp: '18 18 20 20  '$ Temp: 97.6 F (36.4 C) 97.8 F (36.6 C) 97.9 F (36.6 C) (!) 97.3 F (36.3 C)  TempSrc: Oral Oral Oral Oral  SpO2: 100% 98% 99%     Intake/Output Summary (Last 24 hours) at 09/18/2021 1521 Last data filed at 09/18/2021 1100 Gross per 24 hour  Intake 3324.69 ml  Output 600 ml  Net 2724.69 ml   There were no vitals filed for this visit.  Examination:  General exam: Appears calm and comfortable  Respiratory system: Clear to auscultation. Respiratory effort normal. Cardiovascular system: S1 & S2 heard, RRR. No JVD, murmurs, rubs, gallops or clicks. No pedal edema. Gastrointestinal system: Abdomen is nondistended, soft and nontender. No organomegaly or masses felt. Normal bowel sounds heard. Central nervous system: Alert and oriented. No focal  neurological deficits. Extremities: Symmetric 5 x 5 power. Skin: No rashes, lesions or ulcers Psychiatry: Judgement and insight appear normal. Mood & affect appropriate.     Data Reviewed: I have personally reviewed following labs and imaging  studies  CBC: Recent Labs  Lab 09/17/21 1401 09/18/21 0501  WBC 7.3 5.2  NEUTROABS 5.9  --   HGB 12.4 10.9*  HCT 35.0* 32.0*  MCV 107.0* 109.6*  PLT 121* 92*   Basic Metabolic Panel: Recent Labs  Lab 09/17/21 1401 09/18/21 0501  NA 120* 131*  K 4.4 4.5  CL 93* 106  CO2 14* 19*  GLUCOSE 133* 90  BUN 57* 41*  CREATININE 1.91* 1.08*  CALCIUM 9.6 9.3   GFR: CrCl cannot be calculated (Unknown ideal weight.). Liver Function Tests: Recent Labs  Lab 09/17/21 1401 09/18/21 0501  AST 32 25  ALT 22 19  ALKPHOS 92 76  BILITOT 1.5* 1.4*  PROT 7.2 6.1*  ALBUMIN 4.0 3.2*   No results for input(s): "LIPASE", "AMYLASE" in the last 168 hours. No results for input(s): "AMMONIA" in the last 168 hours. Coagulation Profile: No results for input(s): "INR", "PROTIME" in the last 168 hours. Cardiac Enzymes: No results for input(s): "CKTOTAL", "CKMB", "CKMBINDEX", "TROPONINI" in the last 168 hours. BNP (last 3 results) No results for input(s): "PROBNP" in the last 8760 hours. HbA1C: No results for input(s): "HGBA1C" in the last 72 hours. CBG: No results for input(s): "GLUCAP" in the last 168 hours. Lipid Profile: No results for input(s): "CHOL", "HDL", "LDLCALC", "TRIG", "CHOLHDL", "LDLDIRECT" in the last 72 hours. Thyroid Function Tests: Recent Labs    09/18/21 0501  TSH 1.104   Anemia Panel: No results for input(s): "VITAMINB12", "FOLATE", "FERRITIN", "TIBC", "IRON", "RETICCTPCT" in the last 72 hours. Sepsis Labs: No results for input(s): "PROCALCITON", "LATICACIDVEN" in the last 168 hours.  No results found for this or any previous visit (from the past 240 hour(s)).       Radiology Studies: CT Chest Wo Contrast  Result Date: 09/17/2021 CLINICAL DATA:  Abdominal pain, acute, nonlocalized; Chest trauma, blunt EXAM: CT CHEST, ABDOMEN AND PELVIS WITHOUT CONTRAST TECHNIQUE: Multidetector CT imaging of the chest, abdomen and pelvis was performed following the standard  protocol without IV contrast. RADIATION DOSE REDUCTION: This exam was performed according to the departmental dose-optimization program which includes automated exposure control, adjustment of the mA and/or kV according to patient size and/or use of iterative reconstruction technique. COMPARISON:  CT of the abdomen and pelvis dated November 21, 2020 and CT angio chest dated May 05, 2020 FINDINGS: CT CHEST FINDINGS Cardiovascular: Ascending thoracic aorta has a normal appearance. Moderately severe atheromatous calcifications of the thoracic aorta. Atheromatous calcifications of the coronary arteries as well. Normal heart size. No pericardial effusion. Mediastinum/Nodes: No enlarged mediastinal, hilar, or axillary lymph nodes. Thyroid gland, and trachea demonstrate no significant findings. Moderately large hiatal hernia. Lungs/Pleura: Lungs are clear except for some linear scarring at the lung bases greater on the right. No pleural effusion or pneumothorax. Musculoskeletal: There is lobulated soft tissue mass density seen in the right breast (image 27/2) measuring 3.8 x 2.7 cm. Moderate thoracic spondylosis. Mild decrease in the height of the T8 vertebral body measuring about 30% and is new since the previous study. CT ABDOMEN PELVIS FINDINGS Hepatobiliary: Small gallstones in the dependent gallbladder without cholecystitis. Liver has a normal appearance. Pancreas: Unremarkable. No pancreatic ductal dilatation or surrounding inflammatory changes. Spleen: Normal in size without focal abnormality. Adrenals/Urinary Tract: Adrenal glands are unremarkable. Kidneys  are normal, without renal calculi, focal lesion, or hydronephrosis. Bladder is unremarkable. Stomach/Bowel: Most of the stomach has herniated into the hiatal hernia sac. Bowel-gas pattern is nonobstructive. Severe diverticulosis of the sigmoid colon without diverticulitis. Vascular/Lymphatic: Severe atheromatous calcifications of the abdominal aorta and iliac  arteries. No aneurysm. No significant abdominal/pelvic lymphadenopathy. Reproductive: Status post hysterectomy. There is a simple appearing cyst at the posteroinferior pelvis measuring 4.6 cm AP x 6 cm transverse in comparison to 3.9 x 5.3 cm on the previous study. Other: No abdominal wall hernia or abnormality. No abdominopelvic ascites. Musculoskeletal: Again seen is the vertebra plana with severe compression fracture of the T12. Mild anterolisthesis at L4-L5. Moderate thoracolumbar spondylosis. IMPRESSION: 1. Moderately severe thoracic and aortic atherosclerosis. Coronary artery atherosclerosis. 2.  Moderately large hiatal hernia, stable. 3. Tiny gallstones in the dependent gallbladder without cholecystitis. 4. Severe diverticulosis of the sigmoid colon without diverticulitis. 5. Status post hysterectomy. Simple appearing cyst at the inferior pelvis measuring 4.6 x 6 cm in comparison to 3.9 x 5.3 cm on the previous study likely an ovarian cyst. Suggest non-emergent ultrasound examination of the pelvis. 6. Mild compression fracture of the T8 vertebral body with about 30% decrease in the height and is new since the previous study. Severe compression fracture of the T12 is stable. Electronically Signed   By: Frazier Richards M.D.   On: 09/17/2021 15:53   CT ABDOMEN PELVIS WO CONTRAST  Result Date: 09/17/2021 CLINICAL DATA:  Abdominal pain, acute, nonlocalized; Chest trauma, blunt EXAM: CT CHEST, ABDOMEN AND PELVIS WITHOUT CONTRAST TECHNIQUE: Multidetector CT imaging of the chest, abdomen and pelvis was performed following the standard protocol without IV contrast. RADIATION DOSE REDUCTION: This exam was performed according to the departmental dose-optimization program which includes automated exposure control, adjustment of the mA and/or kV according to patient size and/or use of iterative reconstruction technique. COMPARISON:  CT of the abdomen and pelvis dated November 21, 2020 and CT angio chest dated May 05, 2020 FINDINGS: CT CHEST FINDINGS Cardiovascular: Ascending thoracic aorta has a normal appearance. Moderately severe atheromatous calcifications of the thoracic aorta. Atheromatous calcifications of the coronary arteries as well. Normal heart size. No pericardial effusion. Mediastinum/Nodes: No enlarged mediastinal, hilar, or axillary lymph nodes. Thyroid gland, and trachea demonstrate no significant findings. Moderately large hiatal hernia. Lungs/Pleura: Lungs are clear except for some linear scarring at the lung bases greater on the right. No pleural effusion or pneumothorax. Musculoskeletal: There is lobulated soft tissue mass density seen in the right breast (image 27/2) measuring 3.8 x 2.7 cm. Moderate thoracic spondylosis. Mild decrease in the height of the T8 vertebral body measuring about 30% and is new since the previous study. CT ABDOMEN PELVIS FINDINGS Hepatobiliary: Small gallstones in the dependent gallbladder without cholecystitis. Liver has a normal appearance. Pancreas: Unremarkable. No pancreatic ductal dilatation or surrounding inflammatory changes. Spleen: Normal in size without focal abnormality. Adrenals/Urinary Tract: Adrenal glands are unremarkable. Kidneys are normal, without renal calculi, focal lesion, or hydronephrosis. Bladder is unremarkable. Stomach/Bowel: Most of the stomach has herniated into the hiatal hernia sac. Bowel-gas pattern is nonobstructive. Severe diverticulosis of the sigmoid colon without diverticulitis. Vascular/Lymphatic: Severe atheromatous calcifications of the abdominal aorta and iliac arteries. No aneurysm. No significant abdominal/pelvic lymphadenopathy. Reproductive: Status post hysterectomy. There is a simple appearing cyst at the posteroinferior pelvis measuring 4.6 cm AP x 6 cm transverse in comparison to 3.9 x 5.3 cm on the previous study. Other: No abdominal wall hernia or abnormality. No abdominopelvic ascites. Musculoskeletal: Again  seen is the vertebra  plana with severe compression fracture of the T12. Mild anterolisthesis at L4-L5. Moderate thoracolumbar spondylosis. IMPRESSION: 1. Moderately severe thoracic and aortic atherosclerosis. Coronary artery atherosclerosis. 2.  Moderately large hiatal hernia, stable. 3. Tiny gallstones in the dependent gallbladder without cholecystitis. 4. Severe diverticulosis of the sigmoid colon without diverticulitis. 5. Status post hysterectomy. Simple appearing cyst at the inferior pelvis measuring 4.6 x 6 cm in comparison to 3.9 x 5.3 cm on the previous study likely an ovarian cyst. Suggest non-emergent ultrasound examination of the pelvis. 6. Mild compression fracture of the T8 vertebral body with about 30% decrease in the height and is new since the previous study. Severe compression fracture of the T12 is stable. Electronically Signed   By: Frazier Richards M.D.   On: 09/17/2021 15:53        Scheduled Meds:  apixaban  5 mg Oral BID   atorvastatin  40 mg Oral Daily   citalopram  10 mg Oral Daily   metoprolol succinate  50 mg Oral Daily   pantoprazole  40 mg Oral Daily   Continuous Infusions:  sodium chloride 125 mL/hr at 09/18/21 1003     LOS: 0 days    Time spent: 35 mins    Kathie Dike, MD [facility]   If 7PM-7AM, please contact night-coverage [email or web address]  09/18/2021, 3:21 PM

## 2021-09-19 DIAGNOSIS — E871 Hypo-osmolality and hyponatremia: Secondary | ICD-10-CM

## 2021-09-19 DIAGNOSIS — R197 Diarrhea, unspecified: Secondary | ICD-10-CM | POA: Diagnosis not present

## 2021-09-19 DIAGNOSIS — E782 Mixed hyperlipidemia: Secondary | ICD-10-CM | POA: Diagnosis not present

## 2021-09-19 DIAGNOSIS — E872 Acidosis, unspecified: Secondary | ICD-10-CM

## 2021-09-19 DIAGNOSIS — I10 Essential (primary) hypertension: Secondary | ICD-10-CM

## 2021-09-19 DIAGNOSIS — N179 Acute kidney failure, unspecified: Secondary | ICD-10-CM | POA: Diagnosis not present

## 2021-09-19 DIAGNOSIS — I4819 Other persistent atrial fibrillation: Secondary | ICD-10-CM

## 2021-09-19 LAB — COMPREHENSIVE METABOLIC PANEL
ALT: 15 U/L (ref 0–44)
AST: 22 U/L (ref 15–41)
Albumin: 2.7 g/dL — ABNORMAL LOW (ref 3.5–5.0)
Alkaline Phosphatase: 67 U/L (ref 38–126)
Anion gap: 5 (ref 5–15)
BUN: 21 mg/dL (ref 8–23)
CO2: 17 mmol/L — ABNORMAL LOW (ref 22–32)
Calcium: 8.9 mg/dL (ref 8.9–10.3)
Chloride: 114 mmol/L — ABNORMAL HIGH (ref 98–111)
Creatinine, Ser: 0.68 mg/dL (ref 0.44–1.00)
GFR, Estimated: >60 mL/min (ref >60)
Glucose, Bld: 92 mg/dL (ref 70–99)
Potassium: 4 mmol/L (ref 3.5–5.1)
Sodium: 136 mmol/L (ref 135–145)
Total Bilirubin: 0.6 mg/dL (ref 0.3–1.2)
Total Protein: 5.4 g/dL — ABNORMAL LOW (ref 6.5–8.1)

## 2021-09-19 LAB — CBC
HCT: 29.9 % — ABNORMAL LOW (ref 36.0–46.0)
Hemoglobin: 9.9 g/dL — ABNORMAL LOW (ref 12.0–15.0)
MCH: 37.8 pg — ABNORMAL HIGH (ref 26.0–34.0)
MCHC: 33.1 g/dL (ref 30.0–36.0)
MCV: 114.1 fL — ABNORMAL HIGH (ref 80.0–100.0)
Platelets: 86 10*3/uL — ABNORMAL LOW (ref 150–400)
RBC: 2.62 MIL/uL — ABNORMAL LOW (ref 3.87–5.11)
RDW: 13 % (ref 11.5–15.5)
WBC: 3.5 10*3/uL — ABNORMAL LOW (ref 4.0–10.5)
nRBC: 0 % (ref 0.0–0.2)

## 2021-09-19 LAB — C DIFFICILE QUICK SCREEN W PCR REFLEX
C Diff antigen: NEGATIVE
C Diff interpretation: NOT DETECTED
C Diff toxin: NEGATIVE

## 2021-09-19 MED ORDER — SODIUM BICARBONATE 8.4 % IV SOLN
INTRAVENOUS | Status: DC
  Filled 2021-09-19 (×2): qty 150

## 2021-09-19 NOTE — Progress Notes (Signed)
Occupational Therapy Treatment Patient Details Name: Linda Glass MRN: [id number] DOB: 1933-08-06 Today's Date: 09/19/2021   History of present illness patient  is an 88 year old female presenting to [facility] ED on 7/24 after a fall at home several days ago; also reports weakness and diarrhea of 3 days; CT of abdomen revealed Severe diverticulosis of the sigmoid colon without diverticulitis and severe T8compression fx with 30% reduction of height since previous study; stable T12 compression fx.  PMH: AKI, HLD, HTN, PAF, anemia, anxiety.   OT comments  Patient was noted to have no reports of dizziness with mobility on this date. Patient was able to participate in toileting at [facility] across room with RW with min A. Patient was educated on not using pure wic  and using call light to increase functional activity tolerance. Patient verbalized understanding. Patients nurse was educated on the same with nurse verbalizing understanding. Patient's discharge plan remains appropriate at this time. OT will continue to follow acutely.     Recommendations for follow up therapy are one component of a multi-disciplinary discharge planning process, led by the attending physician.  Recommendations may be updated based on patient status, additional functional criteria and insurance authorization.    Follow Up Recommendations  Skilled nursing-short term rehab (<3 hours/day)    Assistance Recommended at Discharge Frequent or constant Supervision/Assistance  Patient can return home with the following  A little help with walking and/or transfers;A little help with bathing/dressing/bathroom;Assistance with cooking/housework;Direct supervision/assist for financial management;Assist for transportation;Help with stairs or ramp for entrance;Direct supervision/assist for medications management   Equipment Recommendations       Recommendations for Other Services      Precautions / Restrictions  Precautions Precautions: Fall Precaution Comments: encourage back precautions Restrictions Weight Bearing Restrictions: No       Mobility Bed Mobility                    Transfers                         Balance                                           ADL either performed or assessed with clinical judgement   ADL Overall ADL's : Needs assistance/impaired                         Toilet Transfer: Minimal assistance;Rolling walker (2 wheels);Ambulation Toilet Transfer Details (indicate cue type and reason): to [facility] across the room. Toileting- Clothing Manipulation and Hygiene: Minimal assistance;Sit to/from stand Toileting - Clothing Manipulation Details (indicate cue type and reason): standing with RW with cues for proper positioning.            Extremity/Trunk Assessment              Vision       Perception     Praxis      Cognition Arousal/Alertness: Awake/alert Behavior During Therapy: WFL for tasks assessed/performed Overall Cognitive Status: Within Functional Limits for tasks assessed                                          Exercises Other Exercises Other Exercises: patient particiapted in  5 sit to stand reps with min guard with increased time and education on reaching back for the recliner v.s. holding the RW    Shoulder Instructions       General Comments      Pertinent Vitals/ Pain       Pain Assessment Pain Assessment: No/denies pain  Home Living                                          Prior Functioning/Environment              Frequency  Min 2X/week        Progress Toward Goals  OT Goals(current goals can now be found in the care plan section)  Progress towards OT goals: Progressing toward goals     Plan Discharge plan remains appropriate    Co-evaluation                 AM-PAC OT "6 Clicks" Daily Activity     Outcome  Measure   Help from another person eating meals?: None Help from another person taking care of personal grooming?: A Little Help from another person toileting, which includes using toliet, bedpan, or urinal?: A Lot Help from another person bathing (including washing, rinsing, drying)?: A Lot Help from another person to put on and taking off regular upper body clothing?: A Little Help from another person to put on and taking off regular lower body clothing?: A Lot 6 Click Score: 16    End of Session Equipment Utilized During Treatment: Rolling walker (2 wheels)  OT Visit Diagnosis: Unsteadiness on feet (R26.81);Muscle weakness (generalized) (M62.81);Other abnormalities of gait and mobility (R26.89)   Activity Tolerance Patient tolerated treatment well   Patient Left in chair;with call bell/phone within reach;with chair alarm set   Nurse Communication Mobility status        Time: 5284-1324 OT Time Calculation (min): 15 min  Charges: OT General Charges $OT Visit: 1 Visit OT Treatments $Self Care/Home Management : 8-22 mins  Jackelyn Poling OTR/L, MS Acute Rehabilitation Department Office# [phone] Pager# [phone]   Marcellina Millin 09/19/2021, 12:28 PM

## 2021-09-19 NOTE — Progress Notes (Signed)
PROGRESS NOTE    Linda Glass  JEH:[id number] DOB: 1933-12-20 DOA: 09/17/2021 PCP: [name], [name], MD   Brief Narrative:  The patient is an elderly 88 year old [place] female with past medical history significant for but to atrial fibrillation, hypertension, hyperlipidemia as well as other comorbidities who lives alone and ambulates with a walker who has been having diarrhea for 23 days prior to admission.  She presented with significant dehydration, AKI and suffered a fall prior to admission.  She started on IV fluids and overall renal function continues to improve.  She is seen by physical therapy and they are recommending SNF.  Diarrhea is being worked up and a GI pathogen panel has been sent and C. difficile has been negative.   Assessment and Plan:  Acute kidney injury -Secondary to dehydration -No signs of obstructive process on CT imaging -Creatinine 1.9 on admission -Had improved to 1.08 with IV fluids yesterday and now BUNs/creatinine is 21-0.68 -Continue IV fluids and monitor urine output but this IV fluids have been changed to sodium bicarbonate drip -Hold ARB -Avoid nephrotoxic medications, contrast dyes, hypotension and dehydration and renally adjust medications -Repeat CMP in the a.m.   Normal anion gap metabolic acidosis -Likely secondary to loose stools -Improving with IV fluids -She has a CO2 of 17,, anion gap of 5, chloride level of 114 -Changed fluids to IV sodium bicarbonate -Continue to monitor and trend and repeat CMP in a.m.   Diarrhea -Likely gastroenteritis -Stool studies have been ordered and C. difficile has been negative; GI pathogen panel pending -She has not been on antibiotics recently -If infectious studies are negative can trial antidiarrheals   Hyponatremia -Related to dehydration and GI losses -Improved with saline and this is now changed to sodium bicarbonate -Sodium went from 120 and trended up to 136 today   Persistent  atrial fibrillation -Heart rate is currently stable on metoprolol -She is anticoagulated with Eliquis which will be continued -Continue to monitor on telemetry   Hypertension -Continuing on beta-blockers -Holding ARB for now in light of acute kidney injury -Blood pressure currently stable   Hyperlipidemia -Continue statin   T8 compression fracture -No weakness or numbness in her legs bilaterally -Continue pain management and PT/OT  Thrombocytopenia -Mild and dropping and patient's platelet count went from 121 -> 92 -> 86 -Continue monitor and trend and repeat CBC in the a.m.   Right breast mass Ovarian cyst -Outpatient work-up if patient desires -Patient knows about her GYN findings on her CT scan of the abdomen pelvis and states that she does not want anything done about this  Macrocytic anemia -Patient's hemoglobin/hematocrit went from 12.4/35.0 and was likely hemoconcentrated on admission given her significant dehydration is now trended down -Hemoglobin/hematocrit is now 9.9/29.9 -Check anemia panel in the a.m. -Continue monitor for signs and symptoms of bleeding; no overt bleeding noted   Fall -Likely secondary to dehydration and renal failure -PT with recommendations for SNF placement -TOC following -She normally ambulates with a walker  DVT prophylaxis:  apixaban (ELIQUIS) tablet 5 mg    Code Status: Full Code Family Communication: No family currently at bedside  Disposition Plan:  Level of care: Telemetry Status is: Observation The patient remains OBS appropriate and will d/c before 2 midnights.    Consultants:  None  Procedures:  None  Antimicrobials:  Anti-infectives (From admission, onward)    None        Subjective: Seen and examined at bedside think she is doing better but still remains  weak.  States that her stools are not as loose.  Denies any chest pain or shortness of breath.  No nausea or vomiting.  Denies any other concerns or  complaints at this time.  Objective: Vitals:   09/18/21 2006 09/19/21 0340 09/19/21 0929 09/19/21 1338  BP: (!) 112/57 139/82 (!) 141/64 119/68  Pulse: 94 (!) 44 65 (!) 51  Resp: '18 16 19 18  '$ Temp: 98 F (36.7 C) 97.6 F (36.4 C) (!) 97.4 F (36.3 C) 97.7 F (36.5 C)  TempSrc: Oral Oral  Oral  SpO2: 99% 100% 100% 100%  Weight:      Height:        Intake/Output Summary (Last 24 hours) at 09/19/2021 1825 Last data filed at 09/19/2021 1811 Gross per 24 hour  Intake 2644.19 ml  Output 1650 ml  Net 994.19 ml   Filed Weights   09/18/21 1944  Weight: 70.8 kg   Examination: Physical Exam:  Constitutional: WN/WD elderly overweight Caucasian female currently in no acute distress Respiratory: Slightly diminished to auscultation bilaterally, no wheezing, rales, rhonchi or crackles. Normal respiratory effort and patient is not tachypenic. No accessory muscle use.  Unlabored breathing Cardiovascular: RRR, no murmurs / rubs / gallops. S1 and S2 auscultated. No extremity edema.  Abdomen: Soft, mildly-tender, distended secondary body habitus. Bowel sounds positive.  GU: Deferred. Musculoskeletal: No clubbing / cyanosis of digits/nails. Normal strength and muscle tone.  Skin: No rashes, lesions, ulcers. No induration; Warm and dry.  Neurologic: CN 2-12 grossly intact with no focal deficits. Romberg sign cerebellar and reflexes not assessed.  Psychiatric: Normal judgment and insight. Alert and oriented x 3. Normal mood and appropriate affect.   Data Reviewed: I have personally reviewed following labs and imaging studies  CBC: Recent Labs  Lab 09/17/21 1401 09/18/21 0501 09/19/21 0516  WBC 7.3 5.2 3.5*  NEUTROABS 5.9  --   --   HGB 12.4 10.9* 9.9*  HCT 35.0* 32.0* 29.9*  MCV 107.0* 109.6* 114.1*  PLT 121* 92* 86*   Basic Metabolic Panel: Recent Labs  Lab 09/17/21 1401 09/18/21 0501 09/19/21 0516  NA 120* 131* 136  K 4.4 4.5 4.0  CL 93* 106 114*  CO2 14* 19* 17*  GLUCOSE  133* 90 92  BUN 57* 41* 21  CREATININE 1.91* 1.08* 0.68  CALCIUM 9.6 9.3 8.9   GFR: Estimated Creatinine Clearance: 45.4 mL/min (by C-G formula based on SCr of 0.68 mg/dL). Liver Function Tests: Recent Labs  Lab 09/17/21 1401 09/18/21 0501 09/19/21 0516  AST 32 25 22  ALT '22 19 15  '$ ALKPHOS 92 76 67  BILITOT 1.5* 1.4* 0.6  PROT 7.2 6.1* 5.4*  ALBUMIN 4.0 3.2* 2.7*   No results for input(s): "LIPASE", "AMYLASE" in the last 168 hours. No results for input(s): "AMMONIA" in the last 168 hours. Coagulation Profile: No results for input(s): "INR", "PROTIME" in the last 168 hours. Cardiac Enzymes: No results for input(s): "CKTOTAL", "CKMB", "CKMBINDEX", "TROPONINI" in the last 168 hours. BNP (last 3 results) No results for input(s): "PROBNP" in the last 8760 hours. HbA1C: No results for input(s): "HGBA1C" in the last 72 hours. CBG: No results for input(s): "GLUCAP" in the last 168 hours. Lipid Profile: No results for input(s): "CHOL", "HDL", "LDLCALC", "TRIG", "CHOLHDL", "LDLDIRECT" in the last 72 hours. Thyroid Function Tests: Recent Labs    09/18/21 0501  TSH 1.104   Anemia Panel: No results for input(s): "VITAMINB12", "FOLATE", "FERRITIN", "TIBC", "IRON", "RETICCTPCT" in the last 72 hours. Sepsis Labs:  No results for input(s): "PROCALCITON", "LATICACIDVEN" in the last 168 hours.  Recent Results (from the past 240 hour(s))  C Difficile Quick Screen w PCR reflex     Status: None   Collection Time: 09/19/21  9:47 AM   Specimen: STOOL  Result Value Ref Range Status   C Diff antigen NEGATIVE NEGATIVE Final   C Diff toxin NEGATIVE NEGATIVE Final   C Diff interpretation No C. difficile detected.  Final    Comment: Performed at [facility], [place] [address], [place], [place] [address]     Radiology Studies: No results found.   Scheduled Meds:  apixaban  5 mg Oral BID   atorvastatin  40 mg Oral Daily   citalopram  10 mg Oral Daily   metoprolol  succinate  50 mg Oral Daily   pantoprazole  40 mg Oral Daily   Continuous Infusions:  sodium bicarbonate 150 mEq in dextrose 5 % 1,150 mL infusion 75 mL/hr at 09/19/21 1811     LOS: 0 days   Raiford Noble, DO [facility] Available via Epic secure chat 7am-7pm After these hours, please refer to coverage provider listed on [email or web address] 09/19/2021, 6:25 PM

## 2021-09-19 NOTE — Progress Notes (Signed)
Physical Therapy Treatment Patient Details Name: Linda Glass MRN: [id number] DOB: 06/26/1933 Today's Date: 09/19/2021   History of Present Illness patient  is an 88 year old female presenting to [facility] ED on 7/24 after a fall at home several days ago; also reports weakness and diarrhea of 3 days; CT of abdomen revealed Severe diverticulosis of the sigmoid colon without diverticulitis and severe T8compression fx with 30% reduction of height since previous study; stable T12 compression fx.  PMH: AKI, HLD, HTN, PAF, anemia, anxiety.    PT Comments    Pt agreeable to working with therapy. She reports she recently received pain meds for her back pain. She was able to walk to and from the bathroom with a RW. She tolerated distance well. She denied dizziness on today. Continue to recommend ST SNF rehab.     Recommendations for follow up therapy are one component of a multi-disciplinary discharge planning process, led by the attending physician.  Recommendations may be updated based on patient status, additional functional criteria and insurance authorization.  Follow Up Recommendations  Skilled nursing-short term rehab (<3 hours/day) Can patient physically be transported by private vehicle: Yes   Assistance Recommended at Discharge PRN  Patient can return home with the following A little help with walking and/or transfers;A little help with bathing/dressing/bathroom;Assistance with cooking/housework;Assist for transportation;Help with stairs or ramp for entrance   Equipment Recommendations  None recommended by PT    Recommendations for Other Services       Precautions / Restrictions Precautions Precautions: Fall Precaution Comments: back precautions for comfort Restrictions Weight Bearing Restrictions: No     Mobility  Bed Mobility Overal bed mobility: Needs Assistance Bed Mobility: Supine to Sit, Sit to Supine     Supine to sit: Supervision, HOB elevated Sit to supine:  Supervision, HOB elevated   General bed mobility comments: for safety only, no physical assist required    Transfers Overall transfer level: Needs assistance Equipment used: Rolling walker (2 wheels) Transfers: Sit to/from Stand Sit to Stand: Min guard           General transfer comment: Min guard for safety. Cues for safety, hand placement. Pt denied dizziness.    Ambulation/Gait Ambulation/Gait assistance: Min guard Gait Distance (Feet): 15 Feet (x2) Assistive device: Rolling walker (2 wheels) Gait Pattern/deviations: Step-through pattern, Decreased stride length       General Gait Details: Min guard for safety. Pt tolerated distance well-walked to/from bathroom with RW. She denied dizziness. Cues for safety, posture, RW proximity.   Stairs             Wheelchair Mobility    Modified Rankin (Stroke Patients Only)       Balance Overall balance assessment: Needs assistance         Standing balance support: Single extremity supported, During functional activity Standing balance-Leahy Scale: Fair                              Cognition Arousal/Alertness: Awake/alert Behavior During Therapy: WFL for tasks assessed/performed Overall Cognitive Status: Within Functional Limits for tasks assessed                                          Exercises      General Comments        Pertinent Vitals/Pain Pain Assessment Pain Assessment: Faces  Faces Pain Scale: Hurts little more Pain Location: back Pain Intervention(s): Monitored during session, Premedicated before session    Home Living                          Prior Function            PT Goals (current goals can now be found in the care plan section) Progress towards PT goals: Progressing toward goals    Frequency    Min 2X/week      PT Plan Current plan remains appropriate    Co-evaluation              AM-PAC PT "6 Clicks" Mobility    Outcome Measure  Help needed turning from your back to your side while in a flat bed without using bedrails?: None Help needed moving from lying on your back to sitting on the side of a flat bed without using bedrails?: A Little Help needed moving to and from a bed to a chair (including a wheelchair)?: A Little Help needed standing up from a chair using your arms (e.g., wheelchair or bedside chair)?: A Little Help needed to walk in hospital room?: A Little Help needed climbing 3-5 steps with a railing? : A Little 6 Click Score: 19    End of Session Equipment Utilized During Treatment: Gait belt Activity Tolerance: Patient tolerated treatment well Patient left: in bed;with call bell/phone within reach;with family/visitor present;with bed alarm set   PT Visit Diagnosis: Difficulty in walking, not elsewhere classified (R26.2);History of falling (Z91.81)     Time: 1445-1501 PT Time Calculation (min) (ACUTE ONLY): 16 min  Charges:  $Gait Training: 8-22 mins                         Doreatha Massed, PT Acute Rehabilitation  Office: [phone] Pager: [phone]

## 2021-09-19 NOTE — TOC Progression Note (Addendum)
Transition of Care [facility]) - Progression Note    Patient Details  Name: Linda Glass MRN: [id number] Date of Birth: 05/20/1933  Transition of Care [facility]) CM/SW [place], Rossie Phone Number: 09/19/2021, 9:56 AM  Clinical Narrative:    CSW spoke with Linda Glass and reviewed bed offers for SNF.  [facility] is currently at capacity and is unable to offer a bed. Pt's friend is to contact other family members and is to reach out to [facility] with decision on bed offer at a later time today.   Update 1100: Pt/family have accepted bed offer for [facility]. Insurance Josem Kaufmann has been started and is currently pending. Reference [id number].   Expected Discharge Plan: [place] Barriers to Discharge: Continued Medical Work up  Expected Discharge Plan and Services Expected Discharge Plan: [place] In-house Referral: Clinical Social Work Discharge Planning Services: CM Consult Post Acute Care Choice: [place] Living arrangements for the past 2 months: Single Family Home                 DME Arranged: N/A DME Agency: NA                   Social Determinants of Health (SDOH) Interventions    Readmission Risk Interventions     No data to display

## 2021-09-19 NOTE — Progress Notes (Signed)
Nutrition Brief Note  Patient identified on the Malnutrition Screening Tool (MST) Report  Patient currently consuming 75-100% of meals. Insignificant weight changes noted in weight records. Per chart review, pt awaiting insurance authorization to d/c to SNF.  Wt Readings from Last 15 Encounters:  09/18/21 70.8 kg  08/23/21 70.8 kg  03/26/21 72.6 kg  03/16/21 76 kg  02/27/21 71.2 kg  01/05/21 78.5 kg  01/05/21 78.5 kg  11/14/20 76.4 kg  06/14/20 74.4 kg  05/31/20 73.6 kg  05/30/20 73.6 kg  05/12/20 78.4 kg  02/11/20 81.7 kg  03/09/18 85.5 kg  08/26/17 85.4 kg    Body mass index is 29.49 kg/m. Patient meets criteria for overweight based on current BMI.   Current diet order is heart healthy, patient is consuming approximately 75-100% of meals at this time. Labs and medications reviewed.   No nutrition interventions warranted at this time. If nutrition issues arise, please consult RD.   Clayton Bibles, MS, RD, LDN Inpatient Clinical Dietitian Contact information available via Amion

## 2021-09-19 NOTE — Plan of Care (Signed)

## 2021-09-20 DIAGNOSIS — I10 Essential (primary) hypertension: Secondary | ICD-10-CM | POA: Diagnosis not present

## 2021-09-20 DIAGNOSIS — E782 Mixed hyperlipidemia: Secondary | ICD-10-CM | POA: Diagnosis not present

## 2021-09-20 DIAGNOSIS — R197 Diarrhea, unspecified: Secondary | ICD-10-CM | POA: Diagnosis not present

## 2021-09-20 DIAGNOSIS — N179 Acute kidney failure, unspecified: Secondary | ICD-10-CM | POA: Diagnosis not present

## 2021-09-20 LAB — CBC WITH DIFFERENTIAL/PLATELET
Abs Immature Granulocytes: 0.01 10*3/uL (ref 0.00–0.07)
Basophils Absolute: 0 10*3/uL (ref 0.0–0.1)
Basophils Relative: 0 %
Eosinophils Absolute: 0 10*3/uL (ref 0.0–0.5)
Eosinophils Relative: 1 %
HCT: 28.8 % — ABNORMAL LOW (ref 36.0–46.0)
Hemoglobin: 9.8 g/dL — ABNORMAL LOW (ref 12.0–15.0)
Immature Granulocytes: 0 %
Lymphocytes Relative: 20 %
Lymphs Abs: 1.1 10*3/uL (ref 0.7–4.0)
MCH: 38 pg — ABNORMAL HIGH (ref 26.0–34.0)
MCHC: 34 g/dL (ref 30.0–36.0)
MCV: 111.6 fL — ABNORMAL HIGH (ref 80.0–100.0)
Monocytes Absolute: 0.5 10*3/uL (ref 0.1–1.0)
Monocytes Relative: 10 %
Neutro Abs: 3.7 10*3/uL (ref 1.7–7.7)
Neutrophils Relative %: 69 %
Platelets: 84 10*3/uL — ABNORMAL LOW (ref 150–400)
RBC: 2.58 MIL/uL — ABNORMAL LOW (ref 3.87–5.11)
RDW: 13 % (ref 11.5–15.5)
WBC: 5.4 10*3/uL (ref 4.0–10.5)
nRBC: 0 % (ref 0.0–0.2)

## 2021-09-20 LAB — GASTROINTESTINAL PANEL BY PCR, STOOL (REPLACES STOOL CULTURE)

## 2021-09-20 LAB — COMPREHENSIVE METABOLIC PANEL
ALT: 15 U/L (ref 0–44)
AST: 22 U/L (ref 15–41)
Albumin: 2.7 g/dL — ABNORMAL LOW (ref 3.5–5.0)
Alkaline Phosphatase: 67 U/L (ref 38–126)
Anion gap: 5 (ref 5–15)
BUN: 11 mg/dL (ref 8–23)
CO2: 27 mmol/L (ref 22–32)
Calcium: 8.9 mg/dL (ref 8.9–10.3)
Chloride: 105 mmol/L (ref 98–111)
Creatinine, Ser: 0.56 mg/dL (ref 0.44–1.00)
GFR, Estimated: >60 mL/min (ref >60)
Glucose, Bld: 105 mg/dL — ABNORMAL HIGH (ref 70–99)
Potassium: 3.6 mmol/L (ref 3.5–5.1)
Sodium: 137 mmol/L (ref 135–145)
Total Bilirubin: 0.6 mg/dL (ref 0.3–1.2)
Total Protein: 5.4 g/dL — ABNORMAL LOW (ref 6.5–8.1)

## 2021-09-20 LAB — MAGNESIUM: Magnesium: 1.4 mg/dL — ABNORMAL LOW (ref 1.7–2.4)

## 2021-09-20 LAB — PHOSPHORUS: Phosphorus: 1.4 mg/dL — ABNORMAL LOW (ref 2.5–4.6)

## 2021-09-20 LAB — GLUCOSE, CAPILLARY: Glucose-Capillary: 134 mg/dL — ABNORMAL HIGH (ref 70–99)

## 2021-09-20 MED ORDER — POTASSIUM PHOSPHATES 15 MMOLE/5ML IV SOLN
30.0000 mmol | Freq: Once | INTRAVENOUS | Status: AC
  Administered 2021-09-20: 30 mmol via INTRAVENOUS
  Filled 2021-09-20: qty 10

## 2021-09-20 MED ORDER — MAGNESIUM SULFATE 4 GM/100ML IV SOLN
4.0000 g | Freq: Once | INTRAVENOUS | Status: AC
  Administered 2021-09-20: 4 g via INTRAVENOUS
  Filled 2021-09-20: qty 100

## 2021-09-20 MED ORDER — LOPERAMIDE HCL 2 MG PO CAPS: 2.0000 mg | ORAL_CAPSULE | ORAL | Status: DC | PRN

## 2021-09-20 NOTE — TOC Transition Note (Signed)
Transition of Care [facility]) - CM/SW Discharge Note   Patient Details  Name: Linda Glass MRN: [id number] Date of Birth: Aug 02, 1933  Transition of Care [facility]) CM/SW Contact:  Vassie Moselle, LCSW Phone Number: 09/20/2021, 8:59 AM   Clinical Narrative:    Pt's insurance authorization for SNF has been approved starting 7/27 thru 7/31.    Final next level of care: Skilled Nursing Facility Barriers to Discharge: Barriers Resolved   Patient Goals and CMS Choice Patient states their goals for this hospitalization and ongoing recovery are:: To return home   Choice offered to / list presented to : Patient  Discharge Placement   Existing PASRR number confirmed : 09/18/21          Patient [facility] bed at: [facility] Patient to be transferred to facility by: [facility] Name of family member notified: Pedro Earls Patient and family notified of of transfer: 09/20/21  Discharge Plan and Services In-house Referral: Clinical Social Work Discharge Planning Services: CM Consult Post Acute Care Choice: [place]          DME Arranged: N/A DME Agency: NA                  Social Determinants of Health (SDOH) Interventions     Readmission Risk Interventions     No data to display

## 2021-09-20 NOTE — Plan of Care (Signed)

## 2021-09-20 NOTE — Progress Notes (Signed)
PROGRESS NOTE    Linda Glass  BFX:[id number] DOB: 1933-05-08 DOA: 09/17/2021 PCP: [name], [name], MD   Brief Narrative:  The patient is an elderly 88 year old [place] female with past medical history significant for but to atrial fibrillation, hypertension, hyperlipidemia as well as other comorbidities who lives alone and ambulates with a walker who has been having diarrhea for 23 days prior to admission.  She presented with significant dehydration, AKI and suffered a fall prior to admission.  She started on IV fluids and overall renal function continues to improve.  She is seen by physical therapy and they are recommending SNF.  Diarrhea is being worked up and a GI pathogen panel has been sent and C. difficile has been negative as well as the GI pathogen panel now.  Patient continues to have some mild electrolyte abnormalities in the setting of her diarrhea and this will be repleted.  She is improving but slowly and had a very large explosive bowel movement this morning.  Anticipating discharging her in next 24 to 48 hours if she is improved further.  We will trial some loperamide for her diarrhea.  Assessment and Plan:  Acute kidney injury, improved -Secondary to dehydration -No signs of obstructive process on CT imaging -She presented with a BUN/creatinine 57/1.91 -Now BUNs/creatinine has improved with IV fluid hydration to 11/2.56 -IV fluids were changed to IV sodium bicarbonate infusion yesterday we will now stop today -Hold ARB -Avoid nephrotoxic medications, contrast dyes, hypotension and dehydration to ensure adequate renal perfusion and renally adjust medications -Repeat CMP in the a.m.   Normal anion gap metabolic acidosis -Likely secondary to loose stools -Improving with IV fluids and now sodium bicarbonate drip is stopped -She had a CO2 of 17, anion gap of 5, chloride level of 114; now patient CO2 was 27, anion gap is 5, chloride level is 105 -Changed fluids to IV  sodium bicarbonate yesterday we will now stop -Continue to monitor and trend and repeat CMP in a.m.   Diarrhea -Likely gastroenteritis -Stool studies have been ordered and C. difficile has been negative; GI pathogen panel is now negative -She has not been on antibiotics recently -Since infectious studies are negative can trial antidiarrheals and have started loperamide 2 mg as needed as needed for diarrhea or loose stools   Hyponatremia -Related to dehydration and GI losses -Improved with saline and this is now changed to sodium bicarbonate -Sodium went from 120 and trended up to 137 today  Hypomagnesemia -Patient's magnesium level is 1.4 -Replete with IV mag sulfate 2 g -Continue to monitor and treat as necessary -Repeat mag level in the a.m.  Hypophosphatemia -Patient's phosphorus level is 1.4 -Replete with IV K Phos 30 mmol -Continue monitor and replete as necessary -Repeat Phos level in a.m.   Persistent atrial fibrillation -Heart rate is currently stable on metoprolol succinate 50 mg -She is anticoagulated with Eliquis which will be continued -Continue to monitor on telemetry   Hypertension -Continuing on beta-blockers -Holding ARB for now in light of acute kidney injury -Blood pressure currently stable -Continue monitor blood pressures per protocol and last blood pressure reading was 121/71   Hyperlipidemia -Continue Atorvastatin 40 mg p.o. daily   T8 compression fracture -No weakness or numbness in her legs bilaterally -Continue pain management and PT/OT evaluations   Thrombocytopenia -Mild and dropping and patient's platelet count went from 121 -> 92 -> 86 and slowly trending down to 84 today -Continue monitor and trend and repeat CBC in the a.m.  Right breast mass Ovarian cyst -Outpatient work-up if patient desires -Patient knows about her GYN findings on her CT scan of the abdomen pelvis and states that she does not want anything done about this    Macrocytic anemia -Patient's hemoglobin/hematocrit went from 12.4/35.0 and was likely hemoconcentrated on admission given her significant dehydration is now trended down and today is now 9.8/20.8 with an MCV of 111.6 -Check anemia panel in the a.m. -Continue monitor for signs and symptoms of bleeding; no overt bleeding noted -Repeat CBC in a.m.   Fall -Likely secondary to dehydration and renal failure -PT with recommendations for SNF placement -TOC following -She normally ambulates with a walker   DVT prophylaxis:  apixaban (ELIQUIS) tablet 5 mg    Code Status: Full Code Family Communication: No family currently at bedside  Disposition Plan:  Level of care: Telemetry Status is: Observation The patient will require care spanning > 2 midnights and should be moved to inpatient because: Anticipating discharge to SNF in the next 24 to 40 hours   Consultants:  None  Procedures:  None  Antimicrobials:  Anti-infectives (From admission, onward)    None       Subjective: Seen and examined at bedside and she states that she was not doing as well today.  Any chest pain or shortness of breath but states that she had a very bad bowel movement this morning and had some slight abdominal discomfort but this is now improved.  Denies any lightheadedness or dizziness.  States that she felt better yesterday and did not sleep very well last night.  Denies any other concerns or complaints at this time.  Objective: Vitals:   09/19/21 1338 09/19/21 2133 09/20/21 0625 09/20/21 0912  BP: 119/68 (!) 142/81 (!) 154/77 127/71  Pulse: (!) 51 62 66 72  Resp: '18 18 18   '$ Temp: 97.7 F (36.5 C) (!) 97.3 F (36.3 C) 98 F (36.7 C)   TempSrc: Oral Oral Oral   SpO2: 100% 99% 99%   Weight:      Height:        Intake/Output Summary (Last 24 hours) at 09/20/2021 1225 Last data filed at 09/20/2021 0830 Gross per 24 hour  Intake 2332.4 ml  Output 2100 ml  Net 232.4 ml   Filed Weights   09/18/21  1944  Weight: 70.8 kg   Examination: Physical Exam:  Constitutional: WN/WD overweight [place] Caucasian female currently no acute distress Respiratory: Diminished to auscultation bilaterally, no wheezing, rales, rhonchi or crackles. Normal respiratory effort and patient is not tachypenic. No accessory muscle use.  Unlabored breathing Cardiovascular: RRR, no murmurs / rubs / gallops. S1 and S2 auscultated.  Trace extremity edema Abdomen: Soft, non-tender, distended secondary to body habitus. Bowel sounds positive.  GU: Deferred. Musculoskeletal: No clubbing / cyanosis of digits/nails. No joint deformity upper and lower extremities.  Skin: No rashes, lesions, ulcers on limited skin evaluation. No induration; Warm and dry.  Neurologic: CN 2-12 grossly intact with no focal deficits. Romberg sign cerebellar and reflexes not assessed.  Psychiatric: Normal judgment and insight. Alert and oriented x 3. Normal mood and appropriate affect.   Data Reviewed: I have personally reviewed following labs and imaging studies  CBC: Recent Labs  Lab 09/17/21 1401 09/18/21 0501 09/19/21 0516 09/20/21 0526  WBC 7.3 5.2 3.5* 5.4  NEUTROABS 5.9  --   --  3.7  HGB 12.4 10.9* 9.9* 9.8*  HCT 35.0* 32.0* 29.9* 28.8*  MCV 107.0* 109.6* 114.1* 111.6*  PLT 121*  92* 86* 84*   Basic Metabolic Panel: Recent Labs  Lab 09/17/21 1401 09/18/21 0501 09/19/21 0516 09/20/21 0526  NA 120* 131* 136 137  K 4.4 4.5 4.0 3.6  CL 93* 106 114* 105  CO2 14* 19* 17* 27  GLUCOSE 133* 90 92 105*  BUN 57* 41* 21 11  CREATININE 1.91* 1.08* 0.68 0.56  CALCIUM 9.6 9.3 8.9 8.9  MG  --   --   --  1.4*  PHOS  --   --   --  1.4*   GFR: Estimated Creatinine Clearance: 45.4 mL/min (by C-G formula based on SCr of 0.56 mg/dL). Liver Function Tests: Recent Labs  Lab 09/17/21 1401 09/18/21 0501 09/19/21 0516 09/20/21 0526  AST 32 '25 22 22  '$ ALT '22 19 15 15  '$ ALKPHOS 92 76 67 67  BILITOT 1.5* 1.4* 0.6 0.6  PROT 7.2 6.1*  5.4* 5.4*  ALBUMIN 4.0 3.2* 2.7* 2.7*   No results for input(s): "LIPASE", "AMYLASE" in the last 168 hours. No results for input(s): "AMMONIA" in the last 168 hours. Coagulation Profile: No results for input(s): "INR", "PROTIME" in the last 168 hours. Cardiac Enzymes: No results for input(s): "CKTOTAL", "CKMB", "CKMBINDEX", "TROPONINI" in the last 168 hours. BNP (last 3 results) No results for input(s): "PROBNP" in the last 8760 hours. HbA1C: No results for input(s): "HGBA1C" in the last 72 hours. CBG: No results for input(s): "GLUCAP" in the last 168 hours. Lipid Profile: No results for input(s): "CHOL", "HDL", "LDLCALC", "TRIG", "CHOLHDL", "LDLDIRECT" in the last 72 hours. Thyroid Function Tests: Recent Labs    09/18/21 0501  TSH 1.104   Anemia Panel: No results for input(s): "VITAMINB12", "FOLATE", "FERRITIN", "TIBC", "IRON", "RETICCTPCT" in the last 72 hours. Sepsis Labs: No results for input(s): "PROCALCITON", "LATICACIDVEN" in the last 168 hours.  Recent Results (from the past 240 hour(s))  Gastrointestinal Panel by PCR , Stool     Status: None   Collection Time: 09/19/21  9:47 AM   Specimen: STOOL  Result Value Ref Range Status   Campylobacter species NOT DETECTED NOT DETECTED Final   Plesimonas shigelloides NOT DETECTED NOT DETECTED Final   Salmonella species NOT DETECTED NOT DETECTED Final   Yersinia enterocolitica NOT DETECTED NOT DETECTED Final   Vibrio species NOT DETECTED NOT DETECTED Final   Vibrio cholerae NOT DETECTED NOT DETECTED Final   Enteroaggregative E coli (EAEC) NOT DETECTED NOT DETECTED Final   Enteropathogenic E coli (EPEC) NOT DETECTED NOT DETECTED Final   Enterotoxigenic E coli (ETEC) NOT DETECTED NOT DETECTED Final   Shiga like toxin producing E coli (STEC) NOT DETECTED NOT DETECTED Final   Shigella/Enteroinvasive E coli (EIEC) NOT DETECTED NOT DETECTED Final   Cryptosporidium NOT DETECTED NOT DETECTED Final   Cyclospora cayetanensis NOT  DETECTED NOT DETECTED Final   Entamoeba histolytica NOT DETECTED NOT DETECTED Final   Giardia lamblia NOT DETECTED NOT DETECTED Final   Adenovirus F40/41 NOT DETECTED NOT DETECTED Final   Astrovirus NOT DETECTED NOT DETECTED Final   Norovirus GI/GII NOT DETECTED NOT DETECTED Final   Rotavirus A NOT DETECTED NOT DETECTED Final   Sapovirus (I, II, IV, and V) NOT DETECTED NOT DETECTED Final    Comment: Performed at [facility], [address] [place], [place] [address]  C Difficile Quick Screen w PCR reflex     Status: None   Collection Time: 09/19/21  9:47 AM   Specimen: STOOL  Result Value Ref Range Status   C Diff antigen NEGATIVE NEGATIVE  Final   C Diff toxin NEGATIVE NEGATIVE Final   C Diff interpretation No C. difficile detected.  Final    Comment: Performed at [facility], [place] [address] [place], [place] [address]     Radiology Studies: No results found.   Scheduled Meds:  apixaban  5 mg Oral BID   atorvastatin  40 mg Oral Daily   citalopram  10 mg Oral Daily   metoprolol succinate  50 mg Oral Daily   pantoprazole  40 mg Oral Daily   Continuous Infusions:  magnesium sulfate bolus IVPB     potassium PHOSPHATE IVPB (in mmol)      LOS: 0 days   Raiford Noble, DO [facility] Available via Epic secure chat 7am-7pm After these hours, please refer to coverage provider listed on [email or web address] 09/20/2021, 12:25 PM

## 2021-09-21 DIAGNOSIS — E782 Mixed hyperlipidemia: Secondary | ICD-10-CM | POA: Diagnosis not present

## 2021-09-21 DIAGNOSIS — N179 Acute kidney failure, unspecified: Secondary | ICD-10-CM | POA: Diagnosis not present

## 2021-09-21 DIAGNOSIS — R262 Difficulty in walking, not elsewhere classified: Secondary | ICD-10-CM | POA: Insufficient documentation

## 2021-09-21 DIAGNOSIS — S22060A Wedge compression fracture of T7-T8 vertebra, initial encounter for closed fracture: Secondary | ICD-10-CM

## 2021-09-21 DIAGNOSIS — M1990 Unspecified osteoarthritis, unspecified site: Secondary | ICD-10-CM | POA: Insufficient documentation

## 2021-09-21 DIAGNOSIS — I10 Essential (primary) hypertension: Secondary | ICD-10-CM | POA: Diagnosis not present

## 2021-09-21 DIAGNOSIS — R197 Diarrhea, unspecified: Secondary | ICD-10-CM | POA: Diagnosis not present

## 2021-09-21 DIAGNOSIS — Z7901 Long term (current) use of anticoagulants: Secondary | ICD-10-CM

## 2021-09-21 LAB — FOLATE: Folate: 14 ng/mL (ref >5.9)

## 2021-09-21 LAB — CBC WITH DIFFERENTIAL/PLATELET
Abs Immature Granulocytes: 0.01 10*3/uL (ref 0.00–0.07)
Basophils Absolute: 0 10*3/uL (ref 0.0–0.1)
Basophils Relative: 0 %
Eosinophils Absolute: 0 10*3/uL (ref 0.0–0.5)
Eosinophils Relative: 0 %
HCT: 29.6 % — ABNORMAL LOW (ref 36.0–46.0)
Hemoglobin: 9.9 g/dL — ABNORMAL LOW (ref 12.0–15.0)
Immature Granulocytes: 0 %
Lymphocytes Relative: 21 %
Lymphs Abs: 1 10*3/uL (ref 0.7–4.0)
MCH: 37.5 pg — ABNORMAL HIGH (ref 26.0–34.0)
MCHC: 33.4 g/dL (ref 30.0–36.0)
MCV: 112.1 fL — ABNORMAL HIGH (ref 80.0–100.0)
Monocytes Absolute: 0.4 10*3/uL (ref 0.1–1.0)
Monocytes Relative: 9 %
Neutro Abs: 3.2 10*3/uL (ref 1.7–7.7)
Neutrophils Relative %: 70 %
Platelets: 93 10*3/uL — ABNORMAL LOW (ref 150–400)
RBC: 2.64 MIL/uL — ABNORMAL LOW (ref 3.87–5.11)
RDW: 13.1 % (ref 11.5–15.5)
WBC: 4.7 10*3/uL (ref 4.0–10.5)
nRBC: 0 % (ref 0.0–0.2)

## 2021-09-21 LAB — COMPREHENSIVE METABOLIC PANEL
ALT: 15 U/L (ref 0–44)
AST: 21 U/L (ref 15–41)
Albumin: 2.8 g/dL — ABNORMAL LOW (ref 3.5–5.0)
Alkaline Phosphatase: 64 U/L (ref 38–126)
Anion gap: 6 (ref 5–15)
BUN: 7 mg/dL — ABNORMAL LOW (ref 8–23)
CO2: 27 mmol/L (ref 22–32)
Calcium: 8.8 mg/dL — ABNORMAL LOW (ref 8.9–10.3)
Chloride: 101 mmol/L (ref 98–111)
Creatinine, Ser: 0.5 mg/dL (ref 0.44–1.00)
GFR, Estimated: >60 mL/min (ref >60)
Glucose, Bld: 95 mg/dL (ref 70–99)
Potassium: 4.3 mmol/L (ref 3.5–5.1)
Sodium: 134 mmol/L — ABNORMAL LOW (ref 135–145)
Total Bilirubin: 0.6 mg/dL (ref 0.3–1.2)
Total Protein: 5.5 g/dL — ABNORMAL LOW (ref 6.5–8.1)

## 2021-09-21 LAB — VITAMIN B12: Vitamin B-12: 1762 pg/mL — ABNORMAL HIGH (ref 180–914)

## 2021-09-21 LAB — FERRITIN: Ferritin: 164 ng/mL (ref 11–307)

## 2021-09-21 LAB — RETICULOCYTES
Immature Retic Fract: 17.6 % — ABNORMAL HIGH (ref 2.3–15.9)
RBC.: 2.64 MIL/uL — ABNORMAL LOW (ref 3.87–5.11)
Retic Count, Absolute: 42.2 10*3/uL (ref 19.0–186.0)
Retic Ct Pct: 1.6 % (ref 0.4–3.1)

## 2021-09-21 LAB — IRON AND TIBC
Iron: 26 ug/dL — ABNORMAL LOW (ref 28–170)
Saturation Ratios: 10 % — ABNORMAL LOW (ref 10.4–31.8)
TIBC: 259 ug/dL (ref 250–450)
UIBC: 233 ug/dL

## 2021-09-21 LAB — MAGNESIUM: Magnesium: 1.8 mg/dL (ref 1.7–2.4)

## 2021-09-21 LAB — PHOSPHORUS: Phosphorus: 2.7 mg/dL (ref 2.5–4.6)

## 2021-09-21 MED ORDER — ACETAMINOPHEN 325 MG PO TABS: 650.0000 mg | ORAL_TABLET | Freq: Four times a day (QID) | ORAL | Status: AC | PRN

## 2021-09-21 MED ORDER — DIPHENHYDRAMINE HCL 25 MG PO CAPS
25.0000 mg | ORAL_CAPSULE | Freq: Four times a day (QID) | ORAL | Status: DC | PRN
  Administered 2021-09-21: 25 mg via ORAL
  Filled 2021-09-21: qty 1

## 2021-09-21 MED ORDER — ONDANSETRON HCL 4 MG PO TABS: 4.0000 mg | ORAL_TABLET | Freq: Four times a day (QID) | ORAL | 0 refills | Status: DC | PRN

## 2021-09-21 MED ORDER — HYDROCODONE-ACETAMINOPHEN 5-325 MG PO TABS: 1.0000 | ORAL_TABLET | Freq: Four times a day (QID) | ORAL | 0 refills | Status: DC | PRN

## 2021-09-21 MED ORDER — LORAZEPAM 0.5 MG PO TABS: 0.5000 mg | ORAL_TABLET | Freq: Every day | ORAL | 0 refills | Status: DC | PRN

## 2021-09-21 MED ORDER — FUROSEMIDE 20 MG PO TABS: 80.0000 mg | ORAL_TABLET | Freq: Every day | ORAL | Status: DC | PRN

## 2021-09-21 MED ORDER — LOPERAMIDE HCL 2 MG PO CAPS: 2.0000 mg | ORAL_CAPSULE | ORAL | 0 refills | Status: DC | PRN

## 2021-09-21 MED ORDER — POTASSIUM & SODIUM PHOSPHATES 280-160-250 MG PO PACK
1.0000 | PACK | Freq: Three times a day (TID) | ORAL | Status: AC
Start: 2021-09-21 — End: 2021-09-21
  Administered 2021-09-21 (×2): 1 via ORAL
  Filled 2021-09-21 (×2): qty 1

## 2021-09-21 MED ORDER — LOSARTAN POTASSIUM 25 MG PO TABS: 100.0000 mg | ORAL_TABLET | Freq: Every day | ORAL | 3 refills | Status: DC

## 2021-09-21 MED ORDER — MAGNESIUM SULFATE 2 GM/50ML IV SOLN
2.0000 g | Freq: Once | INTRAVENOUS | Status: AC
  Administered 2021-09-21: 2 g via INTRAVENOUS
  Filled 2021-09-21: qty 50

## 2021-09-21 NOTE — Discharge Summary (Signed)
Physician Discharge Summary   Patient: Linda Glass MRN: [id number] DOB: 28-Feb-1933  Admit date:     09/17/2021  Discharge date: 09/21/21  Discharge Physician: Raiford Noble, DO    PCP: [name], [name], MD   Recommendations at discharge:   Follow up with PCP within 1-2 weeks and repeat CBC within 1 week. Follow up with Cardiology within 1-2 weeks for outpatient Hospital Follow UP given Diuretics and ARB dosing has changed  Discharge Diagnoses: Principal Problem:   AKI (acute kidney injury) ([place]) Active Problems:   Hyperlipidemia, mixed   Hypertension, essential, benign   Persistent atrial fibrillation (HCC)   Diarrhea   Hyponatremia   Metabolic acidosis   Traumatic compression fracture of T8 vertebra (HCC)   Breast mass, right   Ovarian cyst  Resolved Problems:   * No resolved hospital problems. [facility] Course: The patient is an elderly 88 year old [place] female with past medical history significant for but to atrial fibrillation, hypertension, hyperlipidemia as well as other comorbidities who lives alone and ambulates with a walker who has been having diarrhea for 23 days prior to admission.  She presented with significant dehydration, AKI and suffered a fall prior to admission.  She started on IV fluids and overall renal function continues to improve.  She is seen by physical therapy and they are recommending SNF.  Diarrhea is being worked up and a GI pathogen panel has been sent and C. difficile has been negative as well as the GI pathogen panel now.  Patient continues to have some mild electrolyte abnormalities in the setting of her diarrhea and this will be repleted.  She is improved and the loperamide has helped her diarrhea. She is medically stable to D/C to SNF for continued Strength Training.    Assessment and Plan:  Acute kidney injury, improved -Secondary to dehydration -No signs of obstructive process on CT imaging -She presented with a  BUN/creatinine 57/1.91 -Now BUNs/creatinine has improved with IV fluid hydration to 7/0.50 -IV fluids were changed to IV sodium bicarbonate infusion yesterday we will now stop today -Hold ARB -Avoid nephrotoxic medications, contrast dyes, hypotension and dehydration to ensure adequate renal perfusion and renally adjust medications -Repeat CMP within 1 week   Normal anion gap metabolic acidosis -Likely secondary to loose stools -Improving with IV fluids and now sodium bicarbonate drip is stopped -She had a CO2 of 17, anion gap of 5, chloride level of 114; now patient CO2 was 27, anion gap is 6, chloride level is 101 -Changed fluids to IV sodium bicarbonate yesterday we will now stop -Continue to monitor and trend and repeat CMP within 1 week   Diarrhea, improving  -Likely gastroenteritis -Stool studies have been ordered and C. difficile has been negative; GI pathogen panel is now negative -She has not been on antibiotics recently -Since infectious studies are negative can trial antidiarrheals and have started loperamide 2 mg as needed as needed for diarrhea or loose stools   Hyponatremia, mild  -Related to dehydration and GI losses -Improved with saline and this is now changed to sodium bicarbonate -Sodium went from 120 and trended up to 137 yesterday and today is 134 -Continue to Monitor and Trend in the outpatient setting within 1 week   Hypomagnesemia -Patient's magnesium level is 1.8 -Replete with IV mag sulfate 2 g again  -Continue to monitor and treat as necessary -Repeat mag level in the a.m.   Hypophosphatemia -Patient's phosphorus level went from 1.4 -> 2.7 -Replete with IV K  Phos 30 mmol yesterday -Continue monitor and replete as necessary -Repeat Phos level in a.m.   Persistent atrial fibrillation -Heart rate is currently stable on metoprolol succinate 50 mg -She is anticoagulated with Eliquis which will be continued -Continue to monitor on telemetry    Hypertension -Continuing on beta-blockers -Holding ARB for now in light of acute kidney injury -Blood pressure currently stable -Continue monitor blood pressures per protocol and last blood pressure reading was 142/71   Hyperlipidemia -Continue Atorvastatin 40 mg p.o. daily   T8 compression fracture -No weakness or numbness in her legs bilaterally -Continue pain management and PT/OT evaluations -Follow up as an outpatient    Thrombocytopenia -Mild and dropping and patient's platelet count went from 121 -> 92 -> 86 -> 84 -> 93 today -Continue monitor and trend and repeat CBC in the a.m.   Right breast mass Ovarian cyst -Outpatient work-up if patient desires -Patient knows about her GYN findings on her CT scan of the abdomen pelvis and states that she does not want anything done about this   Macrocytic anemia -Patient's hemoglobin/hematocrit went from 12.4/35.0 and was likely hemoconcentrated on admission given her significant dehydration is now trended down and today is stable and now 9.9/29.6 with an MCV of 112.1 -Anemia panel done and showed an Iron Level of 26, UBIC of 233, TIBC of 259, Saturations 10, Ferritin 164, Folate 14.0, and Vitamin 1762 -Continue monitor for signs and symptoms of bleeding; no overt bleeding noted -Repeat CBC in a.m.   Fall -Likely secondary to dehydration and renal failure -PT with recommendations for SNF placement -TOC following and stable to D/C to SNF  -She normally ambulates with a walker  Consultants: None Procedures performed: None  Disposition: Skilled nursing facility  Diet recommendation:  Cardiac diet  DISCHARGE MEDICATION: Allergies as of 09/21/2021   No Known Allergies      Medication List     STOP taking these medications    spironolactone 25 MG tablet Commonly known as: ALDACTONE       TAKE these medications    acetaminophen 325 MG tablet Commonly known as: TYLENOL Take 2 tablets (650 mg total) by mouth every 6  (six) hours as needed for mild pain (or Fever >/= 101). What changed:  medication strength how much to take reasons to take this   atorvastatin 40 MG tablet Commonly known as: LIPITOR TAKE 1 TABLET EACH DAY. What changed: See the new instructions.   citalopram 10 MG tablet Commonly known as: CELEXA TAKE ONE TABLET BY MOUTH ONCE DAILY   Eliquis 5 MG Tabs tablet Generic drug: apixaban TAKE ONE TABLET BY MOUTH TWICE DAILY What changed: how much to take   Ferrous Gluconate 324 (37.5 Fe) MG Tabs Take 324 mg by mouth every other day.   furosemide 20 MG tablet Commonly known as: LASIX Take 4 tablets (80 mg total) by mouth daily as needed for fluid or edema. What changed:  medication strength when to take this reasons to take this   HYDROcodone-acetaminophen 5-325 MG tablet Commonly known as: NORCO/VICODIN Take 1 tablet by mouth every 6 (six) hours as needed for moderate pain.   loperamide 2 MG capsule Commonly known as: IMODIUM Take 1 capsule (2 mg total) by mouth as needed for diarrhea or loose stools.   LORazepam 0.5 MG tablet Commonly known as: ATIVAN Take 1 tablet (0.5 mg total) by mouth daily as needed for anxiety. What changed: See the new instructions.   losartan 25 MG tablet Commonly known  as: COZAAR Take 4 tablets (100 mg total) by mouth daily. What changed: medication strength   magnesium oxide 400 (241.3 Mg) MG tablet Commonly known as: MAG-OX Take 0.5 tablets (200 mg total) by mouth daily. What changed: how much to take   metoprolol succinate 25 MG 24 hr tablet Commonly known as: TOPROL-XL TAKE ONE TABLET BY MOUTH ONCE DAILY   multivitamin with minerals Tabs tablet Take 1 tablet by mouth daily.   ondansetron 4 MG tablet Commonly known as: ZOFRAN Take 1 tablet (4 mg total) by mouth every 6 (six) hours as needed for nausea.   pantoprazole 40 MG tablet Commonly known as: PROTONIX Take 1 tablet (40 mg total) by mouth daily.   VITAMIN B 12 PO Take  500 mcg by mouth daily.        Contact information for after-discharge care     Destination     [facility] .   Service: Skilled Nursing Contact information: [place] [phone]                    Discharge Exam: Danley Danker Weights   09/18/21 1944  Weight: 70.8 kg   Vitals:   09/21/21 0847 09/21/21 0848  BP: (!) 142/71 (!) 142/71  Pulse: 72 72  Resp:    Temp:    SpO2:     Examination: Physical Exam:  Constitutional: WN/WD overweight Caucasian female in NAD appears a little anxious Respiratory: Diminished to auscultation bilaterally with coarse breath sounds, no wheezing, rales, rhonchi or crackles. Normal respiratory effort and patient is not tachypenic. No accessory muscle use.  Cardiovascular: RRR, no murmurs / rubs / gallops. S1 and S2 auscultated. Trace edema Abdomen: Soft, non-tender, Distended 2/2 body habitus. Bowel sounds positive.  GU: Deferred. Musculoskeletal: No clubbing / cyanosis of digits/nails. Normal strength and muscle tone.  Skin: No rashes, lesions, ulcers on a limited skin evaluation. No induration; Warm and dry.  Neurologic: CN 2-12 grossly intact with no focal deficits. Romberg sign cerebellar reflexes not assessed.  Psychiatric: Normal judgment and insight. Alert and oriented x 3. Anxious mood and appropriate affect.   Condition at discharge: stable  The results of significant diagnostics from this hospitalization (including imaging, microbiology, ancillary and laboratory) are listed below for reference.   Imaging Studies: CT Chest Wo Contrast  Result Date: 09/17/2021 CLINICAL DATA:  Abdominal pain, acute, nonlocalized; Chest trauma, blunt EXAM: CT CHEST, ABDOMEN AND PELVIS WITHOUT CONTRAST TECHNIQUE: Multidetector CT imaging of the chest, abdomen and pelvis was performed following the standard protocol without IV contrast. RADIATION DOSE REDUCTION: This exam was  performed according to the departmental dose-optimization program which includes automated exposure control, adjustment of the mA and/or kV according to patient size and/or use of iterative reconstruction technique. COMPARISON:  CT of the abdomen and pelvis dated November 21, 2020 and CT angio chest dated May 05, 2020 FINDINGS: CT CHEST FINDINGS Cardiovascular: Ascending thoracic aorta has a normal appearance. Moderately severe atheromatous calcifications of the thoracic aorta. Atheromatous calcifications of the coronary arteries as well. Normal heart size. No pericardial effusion. Mediastinum/Nodes: No enlarged mediastinal, hilar, or axillary lymph nodes. Thyroid gland, and trachea demonstrate no significant findings. Moderately large hiatal hernia. Lungs/Pleura: Lungs are clear except for some linear scarring at the lung bases greater on the right. No pleural effusion or pneumothorax. Musculoskeletal: There is lobulated soft tissue mass density seen in the right breast (image 27/2) measuring 3.8 x 2.7 cm. Moderate thoracic  spondylosis. Mild decrease in the height of the T8 vertebral body measuring about 30% and is new since the previous study. CT ABDOMEN PELVIS FINDINGS Hepatobiliary: Small gallstones in the dependent gallbladder without cholecystitis. Liver has a normal appearance. Pancreas: Unremarkable. No pancreatic ductal dilatation or surrounding inflammatory changes. Spleen: Normal in size without focal abnormality. Adrenals/Urinary Tract: Adrenal glands are unremarkable. Kidneys are normal, without renal calculi, focal lesion, or hydronephrosis. Bladder is unremarkable. Stomach/Bowel: Most of the stomach has herniated into the hiatal hernia sac. Bowel-gas pattern is nonobstructive. Severe diverticulosis of the sigmoid colon without diverticulitis. Vascular/Lymphatic: Severe atheromatous calcifications of the abdominal aorta and iliac arteries. No aneurysm. No significant abdominal/pelvic  lymphadenopathy. Reproductive: Status post hysterectomy. There is a simple appearing cyst at the posteroinferior pelvis measuring 4.6 cm AP x 6 cm transverse in comparison to 3.9 x 5.3 cm on the previous study. Other: No abdominal wall hernia or abnormality. No abdominopelvic ascites. Musculoskeletal: Again seen is the vertebra plana with severe compression fracture of the T12. Mild anterolisthesis at L4-L5. Moderate thoracolumbar spondylosis. IMPRESSION: 1. Moderately severe thoracic and aortic atherosclerosis. Coronary artery atherosclerosis. 2.  Moderately large hiatal hernia, stable. 3. Tiny gallstones in the dependent gallbladder without cholecystitis. 4. Severe diverticulosis of the sigmoid colon without diverticulitis. 5. Status post hysterectomy. Simple appearing cyst at the inferior pelvis measuring 4.6 x 6 cm in comparison to 3.9 x 5.3 cm on the previous study likely an ovarian cyst. Suggest non-emergent ultrasound examination of the pelvis. 6. Mild compression fracture of the T8 vertebral body with about 30% decrease in the height and is new since the previous study. Severe compression fracture of the T12 is stable. Electronically Signed   By: Frazier Richards M.D.   On: 09/17/2021 15:53   CT ABDOMEN PELVIS WO CONTRAST  Result Date: 09/17/2021 CLINICAL DATA:  Abdominal pain, acute, nonlocalized; Chest trauma, blunt EXAM: CT CHEST, ABDOMEN AND PELVIS WITHOUT CONTRAST TECHNIQUE: Multidetector CT imaging of the chest, abdomen and pelvis was performed following the standard protocol without IV contrast. RADIATION DOSE REDUCTION: This exam was performed according to the departmental dose-optimization program which includes automated exposure control, adjustment of the mA and/or kV according to patient size and/or use of iterative reconstruction technique. COMPARISON:  CT of the abdomen and pelvis dated November 21, 2020 and CT angio chest dated May 05, 2020 FINDINGS: CT CHEST FINDINGS Cardiovascular:  Ascending thoracic aorta has a normal appearance. Moderately severe atheromatous calcifications of the thoracic aorta. Atheromatous calcifications of the coronary arteries as well. Normal heart size. No pericardial effusion. Mediastinum/Nodes: No enlarged mediastinal, hilar, or axillary lymph nodes. Thyroid gland, and trachea demonstrate no significant findings. Moderately large hiatal hernia. Lungs/Pleura: Lungs are clear except for some linear scarring at the lung bases greater on the right. No pleural effusion or pneumothorax. Musculoskeletal: There is lobulated soft tissue mass density seen in the right breast (image 27/2) measuring 3.8 x 2.7 cm. Moderate thoracic spondylosis. Mild decrease in the height of the T8 vertebral body measuring about 30% and is new since the previous study. CT ABDOMEN PELVIS FINDINGS Hepatobiliary: Small gallstones in the dependent gallbladder without cholecystitis. Liver has a normal appearance. Pancreas: Unremarkable. No pancreatic ductal dilatation or surrounding inflammatory changes. Spleen: Normal in size without focal abnormality. Adrenals/Urinary Tract: Adrenal glands are unremarkable. Kidneys are normal, without renal calculi, focal lesion, or hydronephrosis. Bladder is unremarkable. Stomach/Bowel: Most of the stomach has herniated into the hiatal hernia sac. Bowel-gas pattern is nonobstructive. Severe diverticulosis of the sigmoid colon without  diverticulitis. Vascular/Lymphatic: Severe atheromatous calcifications of the abdominal aorta and iliac arteries. No aneurysm. No significant abdominal/pelvic lymphadenopathy. Reproductive: Status post hysterectomy. There is a simple appearing cyst at the posteroinferior pelvis measuring 4.6 cm AP x 6 cm transverse in comparison to 3.9 x 5.3 cm on the previous study. Other: No abdominal wall hernia or abnormality. No abdominopelvic ascites. Musculoskeletal: Again seen is the vertebra plana with severe compression fracture of the T12.  Mild anterolisthesis at L4-L5. Moderate thoracolumbar spondylosis. IMPRESSION: 1. Moderately severe thoracic and aortic atherosclerosis. Coronary artery atherosclerosis. 2.  Moderately large hiatal hernia, stable. 3. Tiny gallstones in the dependent gallbladder without cholecystitis. 4. Severe diverticulosis of the sigmoid colon without diverticulitis. 5. Status post hysterectomy. Simple appearing cyst at the inferior pelvis measuring 4.6 x 6 cm in comparison to 3.9 x 5.3 cm on the previous study likely an ovarian cyst. Suggest non-emergent ultrasound examination of the pelvis. 6. Mild compression fracture of the T8 vertebral body with about 30% decrease in the height and is new since the previous study. Severe compression fracture of the T12 is stable. Electronically Signed   By: Frazier Richards M.D.   On: 09/17/2021 15:53    Microbiology: Results for orders placed or performed during the hospital encounter of 09/17/21  Gastrointestinal Panel by PCR , Stool     Status: None   Collection Time: 09/19/21  9:47 AM   Specimen: STOOL  Result Value Ref Range Status   Campylobacter species NOT DETECTED NOT DETECTED Final   Plesimonas shigelloides NOT DETECTED NOT DETECTED Final   Salmonella species NOT DETECTED NOT DETECTED Final   Yersinia enterocolitica NOT DETECTED NOT DETECTED Final   Vibrio species NOT DETECTED NOT DETECTED Final   Vibrio cholerae NOT DETECTED NOT DETECTED Final   Enteroaggregative E coli (EAEC) NOT DETECTED NOT DETECTED Final   Enteropathogenic E coli (EPEC) NOT DETECTED NOT DETECTED Final   Enterotoxigenic E coli (ETEC) NOT DETECTED NOT DETECTED Final   Shiga like toxin producing E coli (STEC) NOT DETECTED NOT DETECTED Final   Shigella/Enteroinvasive E coli (EIEC) NOT DETECTED NOT DETECTED Final   Cryptosporidium NOT DETECTED NOT DETECTED Final   Cyclospora cayetanensis NOT DETECTED NOT DETECTED Final   Entamoeba histolytica NOT DETECTED NOT DETECTED Final   Giardia lamblia NOT  DETECTED NOT DETECTED Final   Adenovirus F40/41 NOT DETECTED NOT DETECTED Final   Astrovirus NOT DETECTED NOT DETECTED Final   Norovirus GI/GII NOT DETECTED NOT DETECTED Final   Rotavirus A NOT DETECTED NOT DETECTED Final   Sapovirus (I, II, IV, and V) NOT DETECTED NOT DETECTED Final    Comment: Performed at [facility], [address] [place], [place] [address]  C Difficile Quick Screen w PCR reflex     Status: None   Collection Time: 09/19/21  9:47 AM   Specimen: STOOL  Result Value Ref Range Status   C Diff antigen NEGATIVE NEGATIVE Final   C Diff toxin NEGATIVE NEGATIVE Final   C Diff interpretation No C. difficile detected.  Final    Comment: Performed at [facility], [place] [address] [place], [place] [address]   Labs: CBC: Recent Labs  Lab 09/17/21 1401 09/18/21 0501 09/19/21 0516 09/20/21 0526 09/21/21 0602  WBC 7.3 5.2 3.5* 5.4 4.7  NEUTROABS 5.9  --   --  3.7 3.2  HGB 12.4 10.9* 9.9* 9.8* 9.9*  HCT 35.0* 32.0* 29.9* 28.8* 29.6*  MCV 107.0* 109.6* 114.1* 111.6* 112.1*  PLT 121* 92* 86* 84*  93*   Basic Metabolic Panel: Recent Labs  Lab 09/17/21 1401 09/18/21 0501 09/19/21 0516 09/20/21 0526 09/21/21 0602  NA 120* 131* 136 137 134*  K 4.4 4.5 4.0 3.6 4.3  CL 93* 106 114* 105 101  CO2 14* 19* 17* 27 27  GLUCOSE 133* 90 92 105* 95  BUN 57* 41* 21 11 7*  CREATININE 1.91* 1.08* 0.68 0.56 0.50  CALCIUM 9.6 9.3 8.9 8.9 8.8*  MG  --   --   --  1.4* 1.8  PHOS  --   --   --  1.4* 2.7   Liver Function Tests: Recent Labs  Lab 09/17/21 1401 09/18/21 0501 09/19/21 0516 09/20/21 0526 09/21/21 0602  AST 32 '25 22 22 21  '$ ALT '22 19 15 15 15  '$ ALKPHOS 92 76 67 67 64  BILITOT 1.5* 1.4* 0.6 0.6 0.6  PROT 7.2 6.1* 5.4* 5.4* 5.5*  ALBUMIN 4.0 3.2* 2.7* 2.7* 2.8*   CBG: Recent Labs  Lab 09/20/21 2042  GLUCAP 134*    Discharge time spent: greater than 30 minutes.  Signed: Raiford Noble, DO [facility] 09/21/2021

## 2021-09-21 NOTE — TOC Progression Note (Signed)
Transition of Care [facility]) - Progression Note    Patient Details  Name: Linda Glass MRN: [id number] Date of Birth: 07-22-1933  Transition of Care [facility]) CM/SW Contact  Servando Snare, [place] Phone Number: 09/21/2021, 11:22 AM  Clinical Narrative:    Patient to discharge to [facility]. Family is to transport. Please  send any hard scripts with patient. RN number for report: [phone]   Expected Discharge Plan: Skilled Nursing Facility Barriers to Discharge: Barriers Resolved  Expected Discharge Plan and Services Expected Discharge Plan: [place] In-house Referral: Clinical Social Work Discharge Planning Services: CM Consult Post Acute Care Choice: [place] Living arrangements for the past 2 months: Single Family Home Expected Discharge Date: 09/21/21               DME Arranged: N/A DME Agency: NA                   Social Determinants of Health (SDOH) Interventions    Readmission Risk Interventions     No data to display

## 2021-09-21 NOTE — Plan of Care (Signed)

## 2021-09-21 NOTE — TOC Transition Note (Signed)
Transition of Care [facility]) - CM/SW Discharge Note   Patient Details  Name: Linda Glass MRN: [id number] Date of Birth: [birth year]  Transition of Care [facility]) CM/SW Contact:  Servando Snare, LCSW Phone Number: 09/21/2021, 11:29 AM   Clinical Narrative:    Patient to discharge to [facility]. Family is to transport. Please  send any hard scripts with patient. RN number for report: [phone]   Final next level of care: Skilled Nursing Facility Barriers to Discharge: Barriers Resolved   Patient Goals and CMS Choice Patient states their goals for this hospitalization and ongoing recovery are:: To return home   Choice offered to / list presented to : Patient  Discharge Placement   Existing PASRR number confirmed : 09/18/21          Patient [facility] bed at: [facility] Patient to be transferred to facility by: [facility] Name of family member notified: Pedro Earls Patient and family notified of of transfer: 09/20/21  Discharge Plan and Services In-house Referral: Clinical Social Work Discharge Planning Services: CM Consult Post Acute Care Choice: [place]          DME Arranged: N/A DME Agency: NA                  Social Determinants of Health (SDOH) Interventions     Readmission Risk Interventions     No data to display

## 2021-09-21 NOTE — Progress Notes (Signed)
Report called and given to nurse at facility [facility]. AVS printed and Rx prescription place in pt packet for facility use.

## 2021-09-21 NOTE — Progress Notes (Signed)
Physical Therapy Treatment Patient Details Name: Linda Glass MRN: [id number] DOB: [birth year] Today's Date: 09/21/2021   History of Present Illness patient  is an 88 year old female presenting to [facility] ED on 7/24 after a fall at home several days ago; also reports weakness and diarrhea of 3 days; CT of abdomen revealed Severe diverticulosis of the sigmoid colon without diverticulitis and severe T8compression fx with 30% reduction of height since previous study; stable T12 compression fx.  PMH: AKI, HLD, HTN, PAF, anemia, anxiety.    PT Comments    Pt motivated to participate in therapy, requires increased time and min guard for safety with mobility. Pt ambulates around room with RW, then into hallway after seated rest break. Pt with slow, step through gait pattern, requires increased time with turns and clears past furniture at safe distance. Pt limited by eye itchiness and bowel urge.    Recommendations for follow up therapy are one component of a multi-disciplinary discharge planning process, led by the attending physician.  Recommendations may be updated based on patient status, additional functional criteria and insurance authorization.  Follow Up Recommendations  Skilled nursing-short term rehab (<3 hours/day) Can patient physically be transported by private vehicle: Yes   Assistance Recommended at Discharge PRN  Patient can return home with the following A little help with walking and/or transfers;A little help with bathing/dressing/bathroom;Assistance with cooking/housework;Assist for transportation;Help with stairs or ramp for entrance   Equipment Recommendations  None recommended by PT    Recommendations for Other Services       Precautions / Restrictions Precautions Precautions: Fall Precaution Comments: back precautions for comfort Restrictions Weight Bearing Restrictions: No     Mobility  Bed Mobility Overal bed mobility: Modified Independent  General bed  mobility comments: increased time, light use of bedrail    Transfers Overall transfer level: Needs assistance Equipment used: Rolling walker (2 wheels) Transfers: Sit to/from Stand Sit to Stand: Min guard  General transfer comment: powers to stand with BUE assisting    Ambulation/Gait Ambulation/Gait assistance: Min guard Gait Distance (Feet): 20 Feet (x2) Assistive device: Rolling walker (2 wheels) Gait Pattern/deviations: Step-through pattern, Decreased stride length Gait velocity: decreased  General Gait Details: slow, step through pattern, min guard for safety, limited by eye itching and bowel urge, slow with turns, clears past obstacles at safe distance   Stairs             Wheelchair Mobility    Modified Rankin (Stroke Patients Only)       Balance Overall balance assessment: Needs assistance Sitting-balance support: Feet supported, No upper extremity supported Sitting balance-Leahy Scale: Good  Standing balance support: During functional activity, Bilateral upper extremity supported Standing balance-Leahy Scale: Fair Standing balance comment: static releases UE, dynamic using RW     Cognition Arousal/Alertness: Awake/alert Behavior During Therapy: WFL for tasks assessed/performed Overall Cognitive Status: Within Functional Limits for tasks assessed       Exercises      General Comments        Pertinent Vitals/Pain Pain Assessment Pain Assessment: Faces Faces Pain Scale: Hurts a little bit Pain Location: "eyes itching" Pain Descriptors / Indicators: Discomfort Pain Intervention(s): Limited activity within patient's tolerance    Home Living                          Prior Function            PT Goals (current goals can now be found in  the care plan section) Acute Rehab PT Goals Patient Stated Goal: To reduce falls PT Goal Formulation: With patient Time For Goal Achievement: 10/02/21 Potential to Achieve Goals: Good Progress  towards PT goals: Progressing toward goals    Frequency    Min 2X/week      PT Plan Current plan remains appropriate    Co-evaluation              AM-PAC PT "6 Clicks" Mobility   Outcome Measure  Help needed turning from your back to your side while in a flat bed without using bedrails?: None Help needed moving from lying on your back to sitting on the side of a flat bed without using bedrails?: A Little Help needed moving to and from a bed to a chair (including a wheelchair)?: A Little Help needed standing up from a chair using your arms (e.g., wheelchair or bedside chair)?: A Little Help needed to walk in hospital room?: A Little Help needed climbing 3-5 steps with a railing? : A Little 6 Click Score: 19    End of Session Equipment Utilized During Treatment: Gait belt Activity Tolerance: Patient tolerated treatment well Patient left: in chair;with call bell/phone within reach;with chair alarm set;with family/visitor present Nurse Communication: Mobility status PT Visit Diagnosis: Difficulty in walking, not elsewhere classified (R26.2);History of falling (Z91.81)     Time: 6283-1517 PT Time Calculation (min) (ACUTE ONLY): 11 min  Charges:  $Gait Training: 8-22 mins                      Tori Kema Santaella PT, DPT 09/21/21, 10:00 AM

## 2021-10-02 ENCOUNTER — Ambulatory Visit (HOSPITAL_BASED_OUTPATIENT_CLINIC_OR_DEPARTMENT_OTHER): Payer: Medicare Other | Admitting: Family

## 2021-10-08 ENCOUNTER — Ambulatory Visit (HOSPITAL_BASED_OUTPATIENT_CLINIC_OR_DEPARTMENT_OTHER): Payer: Medicare Other | Admitting: Family

## 2021-10-08 ENCOUNTER — Encounter (HOSPITAL_BASED_OUTPATIENT_CLINIC_OR_DEPARTMENT_OTHER): Payer: Self-pay | Admitting: Family

## 2021-10-08 VITALS — BP 144/92 | HR 80 | Ht 61.0 in | Wt 156.0 lb

## 2021-10-08 DIAGNOSIS — D6859 Other primary thrombophilia: Secondary | ICD-10-CM

## 2021-10-08 DIAGNOSIS — I4821 Permanent atrial fibrillation: Secondary | ICD-10-CM

## 2021-10-08 DIAGNOSIS — I10 Essential (primary) hypertension: Secondary | ICD-10-CM

## 2021-10-08 DIAGNOSIS — I5032 Chronic diastolic (congestive) heart failure: Secondary | ICD-10-CM

## 2021-10-08 MED ORDER — SPIRONOLACTONE 25 MG PO TABS: 25.0000 mg | ORAL_TABLET | Freq: Every day | ORAL | 3 refills | Status: DC

## 2021-10-08 MED ORDER — AMLODIPINE BESYLATE 5 MG PO TABS: 5.0000 mg | ORAL_TABLET | Freq: Every day | ORAL | 1 refills | Status: DC

## 2021-10-08 NOTE — Progress Notes (Signed)
Office Visit    Patient Name: Linda Glass Date of Encounter: 10/08/2021  PCP:  [name], [name], MD   [place]  Cardiologist:  [name], MD  Advanced Practice Provider:  No care team member to display Electrophysiologist:  None      Chief Complaint    [name] is a 88 y.o. female presents today for hospital follow  Past Medical History    Past Medical History:  Diagnosis Date   Anemia 04/2020   Anxiety    Arthritis    Chicken pox    COVID-19    Heart murmur    Hyperlipidemia    Hypertension    Past Surgical History:  Procedure Laterality Date   ABDOMINAL HYSTERECTOMY  1983   BIOPSY  05/05/2020   Procedure: BIOPSY;  Surgeon: Mauri Pole, MD;  Location: [facility];  Service: Endoscopy;;  EGD and COLON   COLONOSCOPY WITH PROPOFOL N/A 05/05/2020   Procedure: COLONOSCOPY WITH PROPOFOL;  Surgeon: Mauri Pole, MD;  Location: [facility];  Service: Endoscopy;  Laterality: N/A;   ESOPHAGOGASTRODUODENOSCOPY (EGD) WITH PROPOFOL N/A 05/05/2020   Procedure: ESOPHAGOGASTRODUODENOSCOPY (EGD) WITH PROPOFOL;  Surgeon: Mauri Pole, MD;  Location: [facility];  Service: Endoscopy;  Laterality: N/A;   FRACTURE SURGERY     wrist fracture 2013   LAPAROSCOPIC PARTIAL COLECTOMY Right 03/12/2021   Procedure: LAPAROSCOPIC HAND ASSISTED RIGHT COLECTOMY;  Surgeon: Felicie Morn, MD;  Location: [facility];  Service: General;  Laterality: Right;    Allergies  No Known Allergies  History of Present Illness    Linda Glass is a 88 y.o. female with a hx of hypertension, permanent atrial fibrillation, HLD, chronic diastolic heart failure  last seen 01/05/21.  Initially seen in hospital 04/2020 with new onset atrial fib in setting of GI bleed with Hb 5.4 at the time. Also with diastolic heart failure. Diuresed 7L. EGD revealed gastric ulcer and tubular adenoma at the ileocecal valve. No  malignancy. Required 4u PRBC. Eliquis resumed on discharge. Rate controlled on metoprolol.   At clinic visit 12/2020 and 6//2023 her losartan was increased due to elevated blood pressure.  She was admitted 7/24 - 09/13/2021 after 23-day history of diarrhea which led to significant dehydration.  She had AKI and suffered a fall prior to admission.  Treated with IVF.  C. difficile and GI pathogen panel negative.  Discharged to SNF.  Presents today for follow up with her daughter.  Completed stay at [facility] and has returned home.  Reports no shortness of breath nor dyspnea on exertion. Reports no chest pain, pressure, or tightness. No edema, orthopnea, PND. Reports no palpitations.  Still some diarrhea which she associates with losartan.  Blood pressure at home has been very labile 100s/60s-150s/80s  EKGs/Labs/Other Studies Reviewed:   The following studies were reviewed today:    Echo 05/02/2020: 1. Left ventricular ejection fraction, by estimation, is 60 to 65%. The  left ventricle has normal function. The left ventricle has no regional  wall motion abnormalities. There is moderate left ventricular hypertrophy.  Left ventricular diastolic function   could not be evaluated.   2. Right ventricular systolic function is normal. The right ventricular  size is normal. There is moderately elevated pulmonary artery systolic  pressure. The estimated right ventricular systolic pressure is 31.5 mmHg.   3. Left atrial size was mildly dilated.   4. Right atrial size was mildly dilated.   5. The mitral valve  is normal in structure. Mild mitral valve  regurgitation.   6. The aortic valve is calcified. There is mild calcification of the  aortic valve. There is mild thickening of the aortic valve. Aortic valve  regurgitation is not visualized. No aortic stenosis is present.   7. The inferior vena cava is dilated in size with <50% respiratory  variability, suggesting right atrial pressure of 15 mmHg.    EKG:  EKG is ordered today.  The ekg ordered today demonstrates atrial fibrillation 69 bpm with incomplete right bundle branch block.  Recent Labs: 09/18/2021: TSH 1.104 09/21/2021: ALT 15; BUN 7; Creatinine, Ser 0.50; Hemoglobin 9.9; Magnesium 1.8; Platelets 93; Potassium 4.3; Sodium 134  Recent Lipid Panel    Component Value Date/Time   CHOL 122 02/11/2020 1103   TRIG 145 02/11/2020 1103   HDL 63 02/11/2020 1103   CHOLHDL 1.9 02/11/2020 1103   VLDL 72.8 (H) 08/21/2016 1014   LDLCALC 36 02/11/2020 1103   LDLDIRECT 120.0 08/26/2017 1005    Risk Assessment/Calculations:   CHA2DS2-VASc Score = 6   This indicates a 9.7% annual risk of stroke. The patient's score is based upon: CHF History: 1 HTN History: 1 Diabetes History: 0 Stroke History: 0 Vascular Disease History: 1 (scattered calcifications seen on CT) Age Score: 2 Gender Score: 1     Home Medications   Current Meds  Medication Sig   acetaminophen (TYLENOL) 325 MG tablet Take 2 tablets (650 mg total) by mouth every 6 (six) hours as needed for mild pain (or Fever >/= 101).   atorvastatin (LIPITOR) 40 MG tablet TAKE 1 TABLET EACH DAY. (Patient taking differently: Take by mouth daily. TAKE 1 TABLET EACH DAY.)   citalopram (CELEXA) 10 MG tablet TAKE ONE TABLET BY MOUTH ONCE DAILY (Patient taking differently: Take 10 mg by mouth daily.)   Cyanocobalamin (VITAMIN B 12 PO) Take 500 mcg by mouth daily.   ELIQUIS 5 MG TABS tablet TAKE ONE TABLET BY MOUTH TWICE DAILY (Patient taking differently: Take 5 mg by mouth 2 (two) times daily.)   Ferrous Gluconate 324 (37.5 Fe) MG TABS Take 324 mg by mouth every other day.   furosemide (LASIX) 20 MG tablet Take 4 tablets (80 mg total) by mouth daily as needed for fluid or edema.   HYDROcodone-acetaminophen (NORCO/VICODIN) 5-325 MG tablet Take 1 tablet by mouth every 6 (six) hours as needed for moderate pain.   loperamide (IMODIUM) 2 MG capsule Take 1 capsule (2 mg total) by mouth as  needed for diarrhea or loose stools.   LORazepam (ATIVAN) 0.5 MG tablet Take 1 tablet (0.5 mg total) by mouth daily as needed for anxiety.   losartan (COZAAR) 25 MG tablet Take 4 tablets (100 mg total) by mouth daily.   magnesium oxide (MAG-OX) 400 (241.3 Mg) MG tablet Take 0.5 tablets (200 mg total) by mouth daily. (Patient taking differently: Take 400 mg by mouth daily.)   metoprolol succinate (TOPROL-XL) 25 MG 24 hr tablet TAKE ONE TABLET BY MOUTH ONCE DAILY (Patient taking differently: Take 25 mg by mouth daily.)   Multiple Vitamin (MULTIVITAMIN WITH MINERALS) TABS tablet Take 1 tablet by mouth daily.   ondansetron (ZOFRAN) 4 MG tablet Take 1 tablet (4 mg total) by mouth every 6 (six) hours as needed for nausea.   pantoprazole (PROTONIX) 40 MG tablet Take 1 tablet (40 mg total) by mouth daily.     Review of Systems      All other systems reviewed and are otherwise negative except  as noted above.  Physical Exam    VS:  BP (!) 144/92   Pulse 80   Ht '5\' 1"'$  (1.549 m)   Wt 156 lb (70.8 kg)   BMI 29.48 kg/m  , BMI Body mass index is 29.48 kg/m.  Wt Readings from Last 3 Encounters:  10/08/21 156 lb (70.8 kg)  09/18/21 156 lb 1.4 oz (70.8 kg)  08/23/21 156 lb (70.8 kg)     GEN: Well nourished, well developed, in no acute distress. HEENT: normal. Neck: Supple, no JVD, carotid bruits, or masses. Cardiac: IRIR, no murmurs, rubs, or gallops. No clubbing, cyanosis, edema.  Radials/PT 2+ and equal bilaterally.  Respiratory:  Respirations regular and unlabored, clear to auscultation bilaterally. GI: Soft, nontender, nondistended. MS: No deformity or atrophy. Skin: Warm and dry, no rash. Neuro:  Strength and sensation are intact. Psych: Normal affect.  Assessment & Plan    Permanent atrial fib / [place] - Asymptomatic in regards to her atrial fibrillation. Continue Toprol '25mg'$  QD. Continue Eliquis '5mg'$  BID. Does not meet dose reduction criteria. Denies bleeding complications. CHA2DS2-VASc  Score = 6 [CHF History: 1, HTN History: 1, Diabetes History: 0, Stroke History: 0, Vascular Disease History: 1 (scattered calcifications seen on CT), Age Score: 2, Gender Score: 1].  Therefore, the patient's annual risk of stroke is 9.7 %.      HFpEF - Euvolemic on exam. Continue current dose Lasix, Spironolactone.  Low sodium diet, fluid restriction <2L, and daily weights encouraged. Educated to contact our office for weight gain of 2 lbs overnight or 5 lbs in one week.   HTN - BP not at goal. Notes diarrhea with Losartan, will add to intolerance list. Continue Spironolactone '25mg'$  QD. Previous edema with Amlodipine '10mg'$  - will trial lower dose 5 mg QD. She will contact our office if she notes edema. Discussed to monitor BP at home at least 2 hours after medications and sitting for 5-10 minutes.    AKI / Hypomagnesia - Noted during recent admission. Update BMP, mag next week.     Disposition: Follow up as scheduled with [name], MD or APP.  Signed, Loel Dubonnet, NP 10/08/2021, 2:55 PM [place]

## 2021-10-08 NOTE — Patient Instructions (Addendum)
Medication Instructions:  Your physician has recommended you make the following change in your medication:   CONTINUE Spironolactone one '25mg'$  tablet daily  START Amlodipine one '5mg'$  tablet daily  STOP Losartan  *If you need a refill on your cardiac medications before your next appointment, please call your pharmacy*   Lab Work: Your physician recommends that you return for lab work the next few weeks for BMP, magnesium  If you have labs (blood work) drawn today and your tests are completely normal, you will receive your results only by: [place] (if you have MyChart) OR A paper copy in the mail If you have any lab test that is abnormal or we need to change your treatment, we will call you to review the results.   Testing/Procedures: None ordered today.    Follow-Up: At [facility], you and your health needs are our priority.  As part of our continuing mission to provide you with exceptional heart care, we have created designated Provider Care Teams.  These Care Teams include your primary Cardiologist (physician) and Advanced Practice Providers (APPs -  Physician Assistants and Nurse Practitioners) who all work together to provide you with the care you need, when you need it.  We recommend signing up for the patient portal called "MyChart".  Sign up information is provided on this After Visit Summary.  MyChart is used to connect with patients for Virtual Visits (Telemedicine).  Patients are able to view lab/test results, encounter notes, upcoming appointments, etc.  Non-urgent messages can be sent to your provider as well.   To learn more about what you can do with MyChart, go to [email or web address].    Your next appointment:   As scheduled   Other Instructions  Heart Healthy Diet Recommendations: A low-salt diet is recommended. Meats should be grilled, baked, or boiled. Avoid fried foods. Focus on lean protein sources like fish or chicken with vegetables and fruits.  The American Heart Association is a Microbiologist!  American Heart Association Diet and Lifeystyle Recommendations   Exercise recommendations: The American Heart Association recommends 150 minutes of moderate intensity exercise weekly. Try 30 minutes of moderate intensity exercise 4-5 times per week. This could include walking, jogging, or swimming.  To prevent or reduce lower extremity swelling: Eat a low salt diet. Salt makes the body hold onto extra fluid which causes swelling. Sit with legs elevated. For example, in the recliner or on an [place].  Wear knee-high compression stockings during the daytime. Ones labeled 15-20 mmHg provide good compression.   Important Information About Sugar

## 2021-10-11 ENCOUNTER — Ambulatory Visit: Payer: Medicare Other | Admitting: Family Medicine

## 2021-10-11 ENCOUNTER — Encounter: Payer: Self-pay | Admitting: Family Medicine

## 2021-10-11 DIAGNOSIS — H906 Mixed conductive and sensorineural hearing loss, bilateral: Secondary | ICD-10-CM | POA: Diagnosis not present

## 2021-10-11 DIAGNOSIS — H6120 Impacted cerumen, unspecified ear: Secondary | ICD-10-CM | POA: Insufficient documentation

## 2021-10-11 DIAGNOSIS — H6122 Impacted cerumen, left ear: Secondary | ICD-10-CM

## 2021-10-11 DIAGNOSIS — H9190 Unspecified hearing loss, unspecified ear: Secondary | ICD-10-CM | POA: Insufficient documentation

## 2021-10-11 NOTE — Progress Notes (Addendum)
4  Established Patient Office Visit  Subjective   Patient ID: Linda Glass, female    DOB: [birth year]  Age: 88 y.o. MRN: [id number]  Chief Complaint  Patient presents with   Ear Fullness    Patient complains of right ear congestion x3weeks, tried Debrox with no relief due to history of cerumen impaction, seen at an urgent care yesterday, ear irrigation performed with no treatment given and advised to follow up with PCP    Patient is here with her daughter, she reports that she is losing her hearing on the right, states that it might have started about 3-4 weeks ago, but could have been longer in duration. Reports no ear pain, no fever/chills, no sinus congestion. States the right ear feels a little full but no other associated sx. States they went to urgent care last night and had an irrigation on the right but it didn't improve her symptoms.   Ear Fullness  This is a recurrent problem. The current episode started more than 1 month ago. The problem occurs constantly. The problem has been unchanged. There has been no fever. The patient is experiencing no pain. She has tried nothing for the symptoms. Her past medical history is significant for hearing loss. There is no history of a chronic ear infection or a tympanostomy tube.      Review of Systems  All other systems reviewed and are negative.     Objective:     BP (!) 170/84 Comment: repeated by Rachel--jaf  Pulse 70   Temp (!) 97.4 F (36.3 C) (Oral)   Ht '5\' 1"'$  (1.549 m)   Wt 153 lb 11.2 oz (69.7 kg)   SpO2 96%   BMI 29.04 kg/m    Physical Exam   No results found for any visits on 10/11/21.    The ASCVD Risk score (Arnett DK, et al., 2019) failed to calculate for the following reasons:   The 2019 ASCVD risk score is only valid for ages 52 to 26    Assessment & Plan:   Problem List Items Addressed This Visit       Nervous and Auditory   Cerumen impaction    Ceruminosis is noted on the left only.  The right EAC was clear, TM was normal.  Wax is removed by Irrigation of the left ear with a mixture of hydrogen peroxide and water. EAC was examined after the irrigation was completed and the wax was only partially removed, there was a larger piece closer to the TM that remained. I recommended the patient use Debrox ear drops every night. She has an appointment with Dr. [name] next week so she will be able to follow up on the cerumen to see if it has improved.      Hearing loss    Most likely sensineural hearing loss. Pt has never had a hearing test before, will place referral to the audiologist for a formal hearing test.       Relevant Orders   Ambulatory referral to Audiology    No follow-ups on file.    Farrel Conners, MD

## 2021-10-11 NOTE — Assessment & Plan Note (Addendum)
Ceruminosis is noted on the left only. The right EAC was clear, TM was normal.  Wax is removed by Irrigation of the left ear with a mixture of hydrogen peroxide and water. EAC was examined after the irrigation was completed and the wax was only partially removed, there was a larger piece closer to the TM that remained. I recommended the patient use Debrox ear drops every night. She has an appointment with Dr. [name] next week so she will be able to follow up on the cerumen to see if it has improved.

## 2021-10-11 NOTE — Assessment & Plan Note (Signed)
Most likely sensineural hearing loss. Pt has never had a hearing test before, will place referral to the audiologist for a formal hearing test.

## 2021-10-16 LAB — BASIC METABOLIC PANEL
BUN/Creatinine Ratio: 16 (ref 12–28)
BUN: 15 mg/dL (ref 8–27)
CO2: 20 mmol/L (ref 20–29)
Calcium: 9.5 mg/dL (ref 8.7–10.3)
Chloride: 98 mmol/L (ref 96–106)
Creatinine, Ser: 0.92 mg/dL (ref 0.57–1.00)
Glucose: 109 mg/dL — ABNORMAL HIGH (ref 70–99)
Potassium: 5 mmol/L (ref 3.5–5.2)
Sodium: 138 mmol/L (ref 134–144)
eGFR: 61 mL/min/{1.73_m2} (ref >59)

## 2021-10-16 LAB — MAGNESIUM: Magnesium: 1.6 mg/dL (ref 1.6–2.3)

## 2021-10-16 NOTE — Progress Notes (Signed)
HPI: Linda Glass is a 88 y.o. female, who is here today with her daughter to follow on recent visit. Hospitalized from 09/17/21 to 09/21/21. Discharged to SNF. She was discharged home on 10/04/21. Transition of care phone call was not found.  [facility] services arranged, having PT 2 times per week.  She presented to the ER after a fall. She was found to be severely dehydration and hypoNa++ (120) after 23 days of diarrhea.Problem noted after Losartan dose was increased from 50 to 100 mg. Diarrhea work up negative. Discharged on Loperamide 2 mg daily prn.  Iron def anemia: She is on Fe Gluconate 324 mg daily. She has not noted melena, blood in stool,or changes in bowel habits.  Lab Results  Component Value Date   WBC 4.7 09/21/2021   HGB 9.9 (L) 09/21/2021   HCT 29.6 (L) 09/21/2021   MCV 112.1 (H) 09/21/2021   PLT 93 (L) 09/21/2021   AKI: Negative for gross hematuria, foam in urine,or decreased urine output. ARB held. Home BP's: Not checking. Normal anion gap metabolic acidosis thought to be caused by diarrhea,which has resolved. Treated with IVF and sodium bicarbonate drip.  HTN on Amlodipine 5 mg daily,Metoprolol succinate 25 mg daily,and Spironolactone 25 mg daily. Negative for severe/frequent headache, visual changes, chest pain, dyspnea, palpitation, focal weakness, or worsening edema. Atrial fib on Eliquis 5 mg bid.  Lab Results  Component Value Date   CREATININE 0.92 10/15/2021   BUN 15 10/15/2021   NA 138 10/15/2021   K 5.0 10/15/2021   CL 98 10/15/2021   CO2 20 10/15/2021   HypoMg++: Replete with IV Mg sulfate. She is on Mg Oxide 400 mg 1/2 tab daily.  Hypophosphatemia:-Phosphorus level went from 1.4  to  2.7 Replete with IV K Phos 30 mmol.  Thoracic compression fracture found on abdominal PQ:ZRAQ compression fracture of the T8 vertebral body with about 30% decrease in the height and is new since the previous study. Severe compression fracture of  the T12 is stable. She is on Tylenol, which helps some. She did not take Oxycodone , which was prescribed.  Right hearing loss, she has an appt with audiologist today. She has seen ENT for cerumen impaction and left hearing loss. Negative for earache or recent URI.  Anxiety and insomnia: She is on Celexa 10 mg daily and Lorazepam 0.5 mg daily prn. Tolerating medications well.  Review of Systems  Constitutional:  Negative for appetite change, diaphoresis and fever.  HENT:  Negative for mouth sores, nosebleeds and trouble swallowing.   Respiratory:  Negative for cough and wheezing.   Gastrointestinal:  Negative for abdominal pain, nausea and vomiting.  Genitourinary:  Negative for difficulty urinating and dysuria.  Musculoskeletal:  Positive for back pain and gait problem.  Neurological:  Negative for syncope, facial asymmetry and weakness.  Rest see pertinent positives and negatives per HPI.  Current Outpatient Medications on File Prior to Visit  Medication Sig Dispense Refill   acetaminophen (TYLENOL) 325 MG tablet Take 2 tablets (650 mg total) by mouth every 6 (six) hours as needed for mild pain (or Fever >/= 101).     amLODipine (NORVASC) 5 MG tablet Take 1 tablet (5 mg total) by mouth daily. 30 tablet 1   atorvastatin (LIPITOR) 40 MG tablet TAKE 1 TABLET EACH DAY. (Patient taking differently: Take by mouth daily. TAKE 1 TABLET EACH DAY.) 90 tablet 3   citalopram (CELEXA) 10 MG tablet TAKE ONE TABLET BY MOUTH ONCE DAILY (Patient taking differently:  Take 10 mg by mouth daily.) 90 tablet 1   Cyanocobalamin (VITAMIN B 12 PO) Take 500 mcg by mouth daily.     Ferrous Gluconate 324 (37.5 Fe) MG TABS Take 324 mg by mouth every other day.     furosemide (LASIX) 80 MG tablet Take 80 mg by mouth daily.     LORazepam (ATIVAN) 0.5 MG tablet Take 1 tablet (0.5 mg total) by mouth daily as needed for anxiety. 5 tablet 0   magnesium oxide (MAG-OX) 400 (241.3 Mg) MG tablet Take 0.5 tablets (200 mg  total) by mouth daily. (Patient taking differently: Take 400 mg by mouth daily.)     metoprolol succinate (TOPROL-XL) 25 MG 24 hr tablet TAKE ONE TABLET BY MOUTH ONCE DAILY (Patient taking differently: Take 25 mg by mouth daily.) 90 tablet 2   Multiple Vitamin (MULTIVITAMIN WITH MINERALS) TABS tablet Take 1 tablet by mouth daily.     pantoprazole (PROTONIX) 40 MG tablet Take 1 tablet (40 mg total) by mouth daily. 90 tablet 2   spironolactone (ALDACTONE) 25 MG tablet Take 1 tablet (25 mg total) by mouth daily. 90 tablet 3   No current facility-administered medications on file prior to visit.   Past Medical History:  Diagnosis Date   Anemia 04/2020   Anxiety    Arthritis    Chicken pox    COVID-19    Heart murmur    Hyperlipidemia    Hypertension    Allergies  Allergen Reactions   Losartan Other (See Comments)    diarrhea    Social History   Socioeconomic History   Marital status: Widowed    Spouse name: Not on file   Number of children: 2   Years of education: Not on file   Highest education level: Not on file  Occupational History   Not on file  Tobacco Use   Smoking status: Never   Smokeless tobacco: Never  Vaping Use   Vaping Use: Never used  Substance and Sexual Activity   Alcohol use: Not Currently    Alcohol/week: 7.0 standard drinks of alcohol    Types: 7 Glasses of wine per week    Comment: Occasional   Drug use: No   Sexual activity: Not Currently  Other Topics Concern   Not on file  Social History Narrative   Not on file   Social Determinants of Health   Financial Resource Strain: Low Risk  (03/26/2021)   Overall Financial Resource Strain (CARDIA)    Difficulty of Paying Living Expenses: Not hard at all  Food Insecurity: No Food Insecurity (03/26/2021)   Hunger Vital Sign    Worried About Running Out of Food in the Last Year: Never true    Ran Out of Food in the Last Year: Never true  Transportation Needs: No Transportation Needs (03/26/2021)    PRAPARE - Hydrologist (Medical): No    Lack of Transportation (Non-Medical): No  Physical Activity: Inactive (03/26/2021)   Exercise Vital Sign    Days of Exercise per Week: 0 days    Minutes of Exercise per Session: 0 min  Stress: No Stress Concern Present (03/26/2021)   [place]    Feeling of Stress : Not at all  Social Connections: Not on file   Vitals:   10/17/21 1022  BP: 138/80  Pulse: 92  Resp: 16  Temp: 98 F (36.7 C)  SpO2: 97%   Body mass index  is 28.53 kg/m.  Physical Exam Vitals and nursing note reviewed.  Constitutional:      General: She is not in acute distress.    Appearance: She is well-developed and well-groomed.  HENT:     Head: Normocephalic and atraumatic.     Right Ear: Tympanic membrane, ear canal and external ear normal. Decreased hearing noted.     Left Ear: External ear normal. Decreased hearing noted.     Ears:     Comments: Left ear canal cerumen impaction.    Mouth/Throat:     Mouth: Mucous membranes are moist.     Pharynx: Oropharynx is clear.  Eyes:     Conjunctiva/sclera: Conjunctivae normal.  Cardiovascular:     Rate and Rhythm: Normal rate. Rhythm irregular.     Pulses:          Dorsalis pedis pulses are 2+ on the right side and 2+ on the left side.     Heart sounds: No murmur heard. Pulmonary:     Effort: Pulmonary effort is normal. No respiratory distress.     Breath sounds: Normal breath sounds.  Abdominal:     Palpations: Abdomen is soft. There is no hepatomegaly or mass.     Tenderness: There is no abdominal tenderness.  Musculoskeletal:     Thoracic back: Tenderness (with movement on examination table, severe.) present.  Lymphadenopathy:     Cervical: No cervical adenopathy.  Skin:    General: Skin is warm.     Findings: No erythema or rash.  Neurological:     General: No focal deficit present.     Mental Status: She is  alert and oriented to person, place, and time.     Cranial Nerves: No cranial nerve deficit.     Comments: Gait assisted with a walker.  Psychiatric:        Mood and Affect: Mood and affect normal.   ASSESSMENT AND PLAN:  Ms.Merrick was seen today for follow-up.  Diagnoses and all orders for this visit:  Closed fracture of twelfth thoracic vertebra with routine healing, unspecified fracture morphology, subsequent encounter We discussed treatment options, she would like Tramadol 50 mg, recommended bid prn. Some side effects discussed as well as the risk for med interaction. I do not think imaging needs to be repeated. Instructed about warning signs.  -     traMADol (ULTRAM) 50 MG tablet; Take 1 tablet (50 mg total) by mouth every 12 (twelve) hours as needed for up to 7 days.  Hypomagnesemia 1.6 2 days ago. Continue current dose of Mg++ supplementation.  Impacted cerumen of left ear Chronic, she has seen ENT. Continue avoid Q tips. She has an appt today with audiologist.  Anxiety disorder, unspecified type Stable. Continue Celexa 10 mg daily and Lorazepam 0.5 mg daily prn.  Hypertension, essential, benign BP adequately controlled. Continue Amlodipine and Spironolactone same dose. Monitor BP at home.  Iron deficiency anemia due to chronic blood loss Stable. She had blood work recently, so prefers to hold on blood work today. Continue iron supplementation.  Return in about 18 weeks (around 02/20/2022).  Aldan Camey G. [name], MD  [facility]. [place] office.

## 2021-10-17 ENCOUNTER — Encounter: Payer: Self-pay | Admitting: Family Medicine

## 2021-10-17 ENCOUNTER — Ambulatory Visit (INDEPENDENT_AMBULATORY_CARE_PROVIDER_SITE_OTHER): Payer: Medicare Other | Admitting: Family Medicine

## 2021-10-17 VITALS — BP 138/80 | HR 92 | Temp 98.0°F | Resp 16 | Ht 61.0 in | Wt 151.0 lb

## 2021-10-17 DIAGNOSIS — I10 Essential (primary) hypertension: Secondary | ICD-10-CM

## 2021-10-17 DIAGNOSIS — F419 Anxiety disorder, unspecified: Secondary | ICD-10-CM | POA: Diagnosis not present

## 2021-10-17 DIAGNOSIS — D500 Iron deficiency anemia secondary to blood loss (chronic): Secondary | ICD-10-CM

## 2021-10-17 DIAGNOSIS — S22089D Unspecified fracture of T11-T12 vertebra, subsequent encounter for fracture with routine healing: Secondary | ICD-10-CM | POA: Diagnosis not present

## 2021-10-17 DIAGNOSIS — N179 Acute kidney failure, unspecified: Secondary | ICD-10-CM

## 2021-10-17 DIAGNOSIS — H6122 Impacted cerumen, left ear: Secondary | ICD-10-CM

## 2021-10-17 MED ORDER — TRAMADOL HCL 50 MG PO TABS: 50.0000 mg | ORAL_TABLET | Freq: Two times a day (BID) | ORAL | 0 refills | Status: AC | PRN

## 2021-10-17 NOTE — Patient Instructions (Addendum)
A few things to remember from today's visit:  Hypomagnesemia  AKI (acute kidney injury) ([place])  Impacted cerumen of left ear  Closed fracture of twelfth thoracic vertebra with routine healing, unspecified fracture morphology, subsequent encounter - Plan: traMADol (ULTRAM) 50 MG tablet  If you need refills please call your pharmacy. Do not use My Chart to request refills or for acute issues that need immediate attention.   Tramadol 2 times daily with Tylenol 500 mg as needed. Fall precautions. We can check anemia next visit. No changes in rest of meds.  Please be sure medication list is accurate. If a new problem present, please set up appointment sooner than planned today.

## 2021-10-19 ENCOUNTER — Other Ambulatory Visit: Payer: Self-pay | Admitting: Family Medicine

## 2021-10-19 DIAGNOSIS — I4891 Unspecified atrial fibrillation: Secondary | ICD-10-CM

## 2021-10-22 ENCOUNTER — Ambulatory Visit: Payer: Medicare Other | Attending: Audiologist | Admitting: Audiologist

## 2021-10-22 DIAGNOSIS — H903 Sensorineural hearing loss, bilateral: Secondary | ICD-10-CM | POA: Insufficient documentation

## 2021-10-22 NOTE — Procedures (Signed)
  Outpatient Audiology and [facility] [place], [place]  [address] [phone]  AUDIOLOGICAL  EVALUATION  NAME: Linda Glass     DOB:   [birth year]      MRN: [id number]                                                                                     DATE: 10/22/2021     REFERENT: [name], [name], MD STATUS: Outpatient DIAGNOSIS: Asymmetric Sensorineural Hearing Loss     History: [name] was seen for an audiological evaluation. [name] was accompanied to the appointment by her neighbor.  [name] is receiving a hearing evaluation due to concerns for hearing loss in her right ear. [name] had a sudden onset of hearing loss in her left ear four year ago. She saw Dr. Lucia Gaskins, Otolaryngology at the time. She took the regiment of steroids but nothing improved her hearing. Dr. Lucia Gaskins did not recommend additional follow up for the sudden loss in the left ear. This difficulty with her right ear has been gradually getting worse. No pain or pressure reported in either ear. Tinnitus denied for both ears. Medical history positive for kidney disease which is a risk factor for hearing loss. No other relevant case history reported.   Evaluation:  Otoscopy showed a clear view of the right tympanic membrane and no view of the left due to significant cerumen  Tympanometry results were consistent with normal middle ear functio bilaterally showing cerumen in left ear is not occluding Audiometric testing was completed using conventional audiometry with supraural and insert transducer. Speech Recognition Thresholds was 70dB in the right ear. Word Recognition was performed at 5dB below UCL for speech, 90dB. scored 80% in the right ear. Pure tone thresholds show mild sloping to profound sensorineural hearing loss in the right ear and profound sensorineural hearing loss in the left ear.   Results:  The test results were reviewed with [facility] and her neighbor. Since  her PCP and her daughter have not been able to get the wax out of her left ear, recommend [name] see Otolaryngology for wax removal and to check left ear since she has not seen Otolaryngology in four years. In the meantime recommend hearing aid for the right ear.      Recommendations: Amplification is necessary for the right ear. Hearing aids can be purchased from a variety of locations. See provided list for locations in the [place] area.  Referral to ENT Physician necessary due to significant cerumen in left ear and asymmetric hearing loss.    36 minutes spent testing and counseling on results.   Alfonse Alpers  Audiologist, Au.D., CCC-A 10/22/2021  1:23 PM  Cc: [name], [name], MD

## 2021-10-24 ENCOUNTER — Telehealth: Payer: Self-pay | Admitting: Family Medicine

## 2021-10-24 NOTE — Telephone Encounter (Signed)
Per PCP - monitor for now, small sips of Gatorade. If any vomiting or pain - patient needs to go to the ED.  I called and spoke with Izora Gala, we went over the instructions above & she verbalized understanding.

## 2021-10-24 NOTE — Telephone Encounter (Signed)
Seymour Bars is giving care to Pt.  Her number is  [phone]  Please do not call Pt, as she is hard of hearing and cannot hear/talk on phone.  Ms. Wynetta Emery called to say Pt is shaking, not eating, nauseous since Monday and is refusing to go to UC.  There was no availability here today or at [facility].  Scheduled Pt for Thursday 10/25/21 @ 3:30pm with Dr. Legrand Como.  Pt would like a call back for advise.  Seymour Bars [phone]

## 2021-10-25 ENCOUNTER — Ambulatory Visit (INDEPENDENT_AMBULATORY_CARE_PROVIDER_SITE_OTHER): Payer: Medicare Other | Admitting: Family Medicine

## 2021-10-25 ENCOUNTER — Encounter: Payer: Self-pay | Admitting: Family Medicine

## 2021-10-25 VITALS — BP 138/80 | HR 85 | Temp 97.5°F | Ht 61.0 in | Wt 149.6 lb

## 2021-10-25 DIAGNOSIS — R0981 Nasal congestion: Secondary | ICD-10-CM | POA: Diagnosis not present

## 2021-10-25 DIAGNOSIS — F419 Anxiety disorder, unspecified: Secondary | ICD-10-CM

## 2021-10-25 DIAGNOSIS — R110 Nausea: Secondary | ICD-10-CM | POA: Diagnosis not present

## 2021-10-25 DIAGNOSIS — J3489 Other specified disorders of nose and nasal sinuses: Secondary | ICD-10-CM | POA: Diagnosis not present

## 2021-10-25 MED ORDER — FLUTICASONE PROPIONATE 50 MCG/ACT NA SUSP: 2.0000 | Freq: Every day | NASAL | 2 refills | Status: DC

## 2021-10-25 MED ORDER — ONDANSETRON HCL 4 MG PO TABS: 4.0000 mg | ORAL_TABLET | Freq: Three times a day (TID) | ORAL | 0 refills | Status: DC | PRN

## 2021-10-25 MED ORDER — CITALOPRAM HYDROBROMIDE 20 MG PO TABS: 20.0000 mg | ORAL_TABLET | Freq: Every day | ORAL | 1 refills | Status: DC

## 2021-10-25 NOTE — Patient Instructions (Addendum)
Increase your celexa (Citalopram) to 20 mg daily, you may take 2 tablets of the 10 mg daily, then go to the pharmacy to get the new prescription.  Recommend an emergency alert device to have at home in case of falls (apple watch, life alert, etc.)

## 2021-10-25 NOTE — Progress Notes (Signed)
Established Patient Office Visit  Subjective   Patient ID: Linda Glass, female    DOB: [birth year]  Age: 88 y.o. MRN: [id number]  Chief Complaint  Patient presents with   Nausea    X3 days   Anorexia    X3 days   Shaking    X3 days   Sinus Problem    Patient complains of sinus drainage x3 days, denies pain    Patient is here for follow up today. She is reporting increase in shaking. States that it has been going on for a few days States that she has felt sick to her stomach, nauseated for the past few days, hasn't eaten much because of the nausea, no diarrhea or constipation. Neighbor is with her in the visit and she was able to eat lunch and breakfast today. No abdominal pain. She is reporting a lot of mucus/ sinus drainage and congestion. No dizziness or trouble breathing.   Sinus Problem      Review of Systems  All other systems reviewed and are negative.     Objective:     BP 138/80 (BP Location: Left Arm, Patient Position: Sitting, Cuff Size: Large)   Pulse 85   Temp (!) 97.5 F (36.4 C) (Oral)   Ht '5\' 1"'$  (1.549 m)   Wt 149 lb 9.6 oz (67.9 kg)   SpO2 98%   BMI 28.27 kg/m    Physical Exam HENT:     Right Ear: Tympanic membrane normal.     Left Ear: There is impacted cerumen.     Nose: Nose normal.     Mouth/Throat:     Mouth: Mucous membranes are moist.  Neurological:     Mental Status: She is alert and oriented to person, place, and time. Mental status is at baseline.     Motor: Tremor (mild intention tremor in both hands) present.     Coordination: Coordination is intact.     Gait: Gait is intact.      No results found for any visits on 10/25/21.    The ASCVD Risk score (Arnett DK, et al., 2019) failed to calculate for the following reasons:   The 2019 ASCVD risk score is only valid for ages 53 to 4    Assessment & Plan:   Problem List Items Addressed This Visit       Other   Anxiety disorder - Primary    Her tremor is  very slight, it could have gotten worse due to the acute nausea/ reduced appetite she suddenly experienced this week, she reports she constantly worries about her health, feels nervous most of the time. I recommended we increase her celexa to help with this problem and she is agreeable. Will increase to 20 mg daily.      Relevant Medications   citalopram (CELEXA) 20 MG tablet   Other Visit Diagnoses     Nasal congestion with rhinorrhea       Relevant Medications   fluticasone (FLONASE) 50 MCG/ACT nasal spray   Nausea       Relevant Medications   ondansetron (ZOFRAN) 4 MG tablet  It is likely that the nausea was brought on by the nasal congestion/ rhinorrhea. Her nausea is mostly resolved at this point, she did eat breakfast and lunch today without any problem. I recommended starting flonase daily for the nasal congestion and I will give her PRN ondansetron 4 mg every 8 hours to use for the nausea.     No follow-ups  on file.    Farrel Conners, MD

## 2021-10-26 NOTE — Assessment & Plan Note (Signed)
Her tremor is very slight, it could have gotten worse due to the acute nausea/ reduced appetite she suddenly experienced this week, she reports she constantly worries about her health, feels nervous most of the time. I recommended we increase her celexa to help with this problem and she is agreeable. Will increase to 20 mg daily.

## 2021-11-29 ENCOUNTER — Other Ambulatory Visit: Payer: Self-pay | Admitting: Family Medicine

## 2021-11-30 ENCOUNTER — Other Ambulatory Visit: Payer: Self-pay | Admitting: Family Medicine

## 2021-11-30 ENCOUNTER — Telehealth: Payer: Self-pay | Admitting: Family Medicine

## 2021-11-30 NOTE — Telephone Encounter (Signed)
Aldean Baker with [facility] is unable to refill LORazepam (ATIVAN) 0.5 MG tablet the rx is over 6 month and they need a new rx  [place], [place] Phone:  [phone]  Fax:  [phone]

## 2021-11-30 NOTE — Telephone Encounter (Signed)
The refill is out of date, the pharmacy can't use the prescription.

## 2021-11-30 NOTE — Telephone Encounter (Signed)
Noted, request sent to pcp.

## 2021-11-30 NOTE — Telephone Encounter (Signed)
According to [place] controlled substance report, she still has 1 refill available at her pharmacy. Last refill on 10/04/21. [facility]

## 2021-12-06 ENCOUNTER — Other Ambulatory Visit (HOSPITAL_BASED_OUTPATIENT_CLINIC_OR_DEPARTMENT_OTHER): Payer: Self-pay | Admitting: Family

## 2021-12-06 DIAGNOSIS — I10 Essential (primary) hypertension: Secondary | ICD-10-CM

## 2021-12-06 NOTE — Telephone Encounter (Signed)
Rx(s) sent to pharmacy electronically.  

## 2021-12-11 ENCOUNTER — Telehealth: Payer: Self-pay | Admitting: Pharmacist

## 2021-12-11 NOTE — Progress Notes (Unsigned)
Chronic Care Management Pharmacy Assistant   Name: Linda Glass  MRN: [id number] DOB: October 02, 1933  Reason for Encounter: Disease State   Conditions to be addressed/monitored: HTN  Recent office visits:  10/25/21 Farrel Conners, MD - Patient presented for Anxiety disorder unspecified and other concerns. Prescribed Fluticasone Propionate. Prescribed Ondansetron HCL. Changed Citalopram Hydrobromide.  10/17/21 [name], [name], MD - Patient presented for Closed fracture of twelfth thoracic vertebra with routine healing, unspecified fracture morphology subsequent encounter and other concerns. Prescribed Tramadol. Changed Furosemide.  10/11/21 Farrel Conners, MD - Patient presented for Impacted cerumen of left ear and other concerns. Stopped Hydrocodone- Acetaminophen. Stopped Loperamide. Stopped Ondansetron.  Recent consult visits:  10/22/21 Donalee Citrin, AUD (Audiology) - Patient presented for Asymmetric SNHL. No medication changes.  10/08/21 Loel Dubonnet, NP (Cardiology) - Patient presented for Primary hypertension and other concerns. Prescribed Amlodipine Besylate. Prescribed Spironolactone. Stopped Losartan.  09/27/21 Celso Sickle, MD - Patient presented for Impaired gait and mobility and other concerns. No medication changes.  08/23/21 Loel Dubonnet, NP (Cardiology) - Patient presented for Primary hypertension and other concerns. Changed Losartan.  Hospital visits:  Medication Reconciliation was completed by comparing discharge summary, patient's EMR and Pharmacy list, and upon discussion with patient.  Patient presented to [facility] on 09/17/21 due to Acute Kidney Injury. Patient was present for 4 days.  New?Medications Started at [facility] Discharge:?? -started HYDROcodone-acetaminophen (NORCO/VICODIN) loperamide (IMODIUM) ondansetron (ZOFRAN)  Medication Changes at Hospital Discharge: acetaminophen (TYLENOL) furosemide  (LASIX) LORazepam (ATIVAN) losartan (COZAAR  Medications Discontinued at Hospital Discharge: -Stopped spironolactone 25 MG tablet (ALDACTONE)  Medications that remain the same after Hospital Discharge:??  -All other medications will remain the same.    Medications: Outpatient Encounter Medications as of 12/11/2021  Medication Sig   acetaminophen (TYLENOL) 325 MG tablet Take 2 tablets (650 mg total) by mouth every 6 (six) hours as needed for mild pain (or Fever >/= 101).   amLODipine (NORVASC) 5 MG tablet TAKE ONE TABLET BY MOUTH DAILY   atorvastatin (LIPITOR) 40 MG tablet TAKE 1 TABLET EACH DAY. (Patient taking differently: Take by mouth daily. TAKE 1 TABLET EACH DAY.)   citalopram (CELEXA) 20 MG tablet Take 1 tablet (20 mg total) by mouth daily.   Cyanocobalamin (VITAMIN B 12 PO) Take 500 mcg by mouth daily.   ELIQUIS 5 MG TABS tablet TAKE ONE TABLET BY MOUTH TWICE DAILY   Ferrous Gluconate 324 (37.5 Fe) MG TABS Take 324 mg by mouth every other day.   fluticasone (FLONASE) 50 MCG/ACT nasal spray Place 2 sprays into both nostrils daily.   furosemide (LASIX) 80 MG tablet Take 80 mg by mouth daily.   LORazepam (ATIVAN) 0.5 MG tablet TAKE 1/2 TABLET AT BEDTIME AS NEEDED FOR ANXIETY   magnesium oxide (MAG-OX) 400 (241.3 Mg) MG tablet Take 0.5 tablets (200 mg total) by mouth daily. (Patient taking differently: Take 400 mg by mouth daily.)   metoprolol succinate (TOPROL-XL) 25 MG 24 hr tablet TAKE ONE TABLET BY MOUTH ONCE DAILY (Patient taking differently: Take 25 mg by mouth daily.)   Multiple Vitamin (MULTIVITAMIN WITH MINERALS) TABS tablet Take 1 tablet by mouth daily.   ondansetron (ZOFRAN) 4 MG tablet Take 1 tablet (4 mg total) by mouth every 8 (eight) hours as needed for nausea or vomiting.   pantoprazole (PROTONIX) 40 MG tablet Take 1 tablet (40 mg total) by mouth daily.   spironolactone (ALDACTONE) 25 MG tablet Take 1 tablet (25 mg  total) by mouth daily.   No facility-administered  encounter medications on file as of 12/11/2021.   Reviewed chart prior to disease state call. Spoke with patient regarding BP  Recent Office Vitals: BP Readings from Last 3 Encounters:  10/25/21 138/80  10/17/21 138/80  10/11/21 (!) 170/84   Pulse Readings from Last 3 Encounters:  10/25/21 85  10/17/21 92  10/11/21 70    Wt Readings from Last 3 Encounters:  10/25/21 149 lb 9.6 oz (67.9 kg)  10/17/21 151 lb (68.5 kg)  10/11/21 153 lb 11.2 oz (69.7 kg)     Kidney Function Lab Results  Component Value Date/Time   CREATININE 0.92 10/15/2021 12:43 PM   CREATININE 0.50 09/21/2021 06:02 AM   CREATININE 0.53 (L) 02/11/2020 11:03 AM   GFR 80.07 11/14/2020 10:37 AM   GFRNONAA >60 09/21/2021 06:02 AM   GFRNONAA 87 02/11/2020 11:03 AM   GFRAA 101 02/11/2020 11:03 AM       Latest Ref Rng & Units 10/15/2021   12:43 PM 09/21/2021    6:02 AM 09/20/2021    5:26 AM  BMP  Glucose 70 - 99 mg/dL 109  95  105   BUN 8 - 27 mg/dL '15  7  11   '$ Creatinine 0.57 - 1.00 mg/dL 0.92  0.50  0.56   BUN/Creat Ratio 12 - 28 16     Sodium 134 - 144 mmol/L 138  134  137   Potassium 3.5 - 5.2 mmol/L 5.0  4.3  3.6   Chloride 96 - 106 mmol/L 98  101  105   CO2 20 - 29 mmol/L '20  27  27   '$ Calcium 8.7 - 10.3 mg/dL 9.5  8.8  8.9     Current antihypertensive regimen:  Metoprolol succinate 25 mg 1 tablet daily - in AM - Appropriate, Effective, Safe, Accessible Losartan 50 mg 1 tablet daily - in AM - Appropriate, Query effective, Safe, Accessible Spironolactone 25 mg 1 tablet daily - Appropriate, Query effective, Safe, Accessible -Medications previously tried: amlodipine (swelling) How often are you checking your Blood Pressure? {CHL HP BP Monitoring Frequency:[phone]} Current home BP readings: *** What recent interventions/DTPs have been made by any provider to improve Blood Pressure control since last CPP Visit: *** Any recent hospitalizations or ED visits since last visit with CPP? {yes/no:20286} What  diet changes have been made to improve Blood Pressure Control?  *** What exercise is being done to improve your Blood Pressure Control?  ***  Adherence Review: Is the patient currently on ACE/ARB medication? {yes/no:20286} Does the patient have >5 day gap between last estimated fill dates? {yes/no:20286}     Care Gaps: COVID Booster - Overdue Zoster Vaccine - Postponed Flu Vaccine - Postponed CCM- BP- AWV- Lab Results  Component Value Date   HGBA1C 5.9 (H) 02/27/2021    Star Rating Drugs: Atorvastatin '40mg'$  - Last filled 08/29/21 90 [facility]  Losartan '50mg'$  - Last filled 08/23/21 90 DS at [place] Pharmacist Assistant [phone]

## 2021-12-14 ENCOUNTER — Ambulatory Visit (INDEPENDENT_AMBULATORY_CARE_PROVIDER_SITE_OTHER): Payer: Medicare Other

## 2021-12-14 ENCOUNTER — Ambulatory Visit (HOSPITAL_BASED_OUTPATIENT_CLINIC_OR_DEPARTMENT_OTHER): Payer: Medicare Other | Admitting: Family

## 2021-12-14 DIAGNOSIS — Z23 Encounter for immunization: Secondary | ICD-10-CM | POA: Diagnosis not present

## 2022-01-03 ENCOUNTER — Other Ambulatory Visit: Payer: Self-pay | Admitting: Family Medicine

## 2022-01-03 DIAGNOSIS — I10 Essential (primary) hypertension: Secondary | ICD-10-CM

## 2022-01-03 DIAGNOSIS — R000 Tachycardia, unspecified: Secondary | ICD-10-CM

## 2022-01-04 IMAGING — DX DG CHEST 1V PORT
1 series · 1 of 1 positions shown · non-contrast
Comparison: No prior.

CLINICAL DATA: EGMYH-IF.

EXAM:
PORTABLE CHEST 1 VIEW

[chest ap]
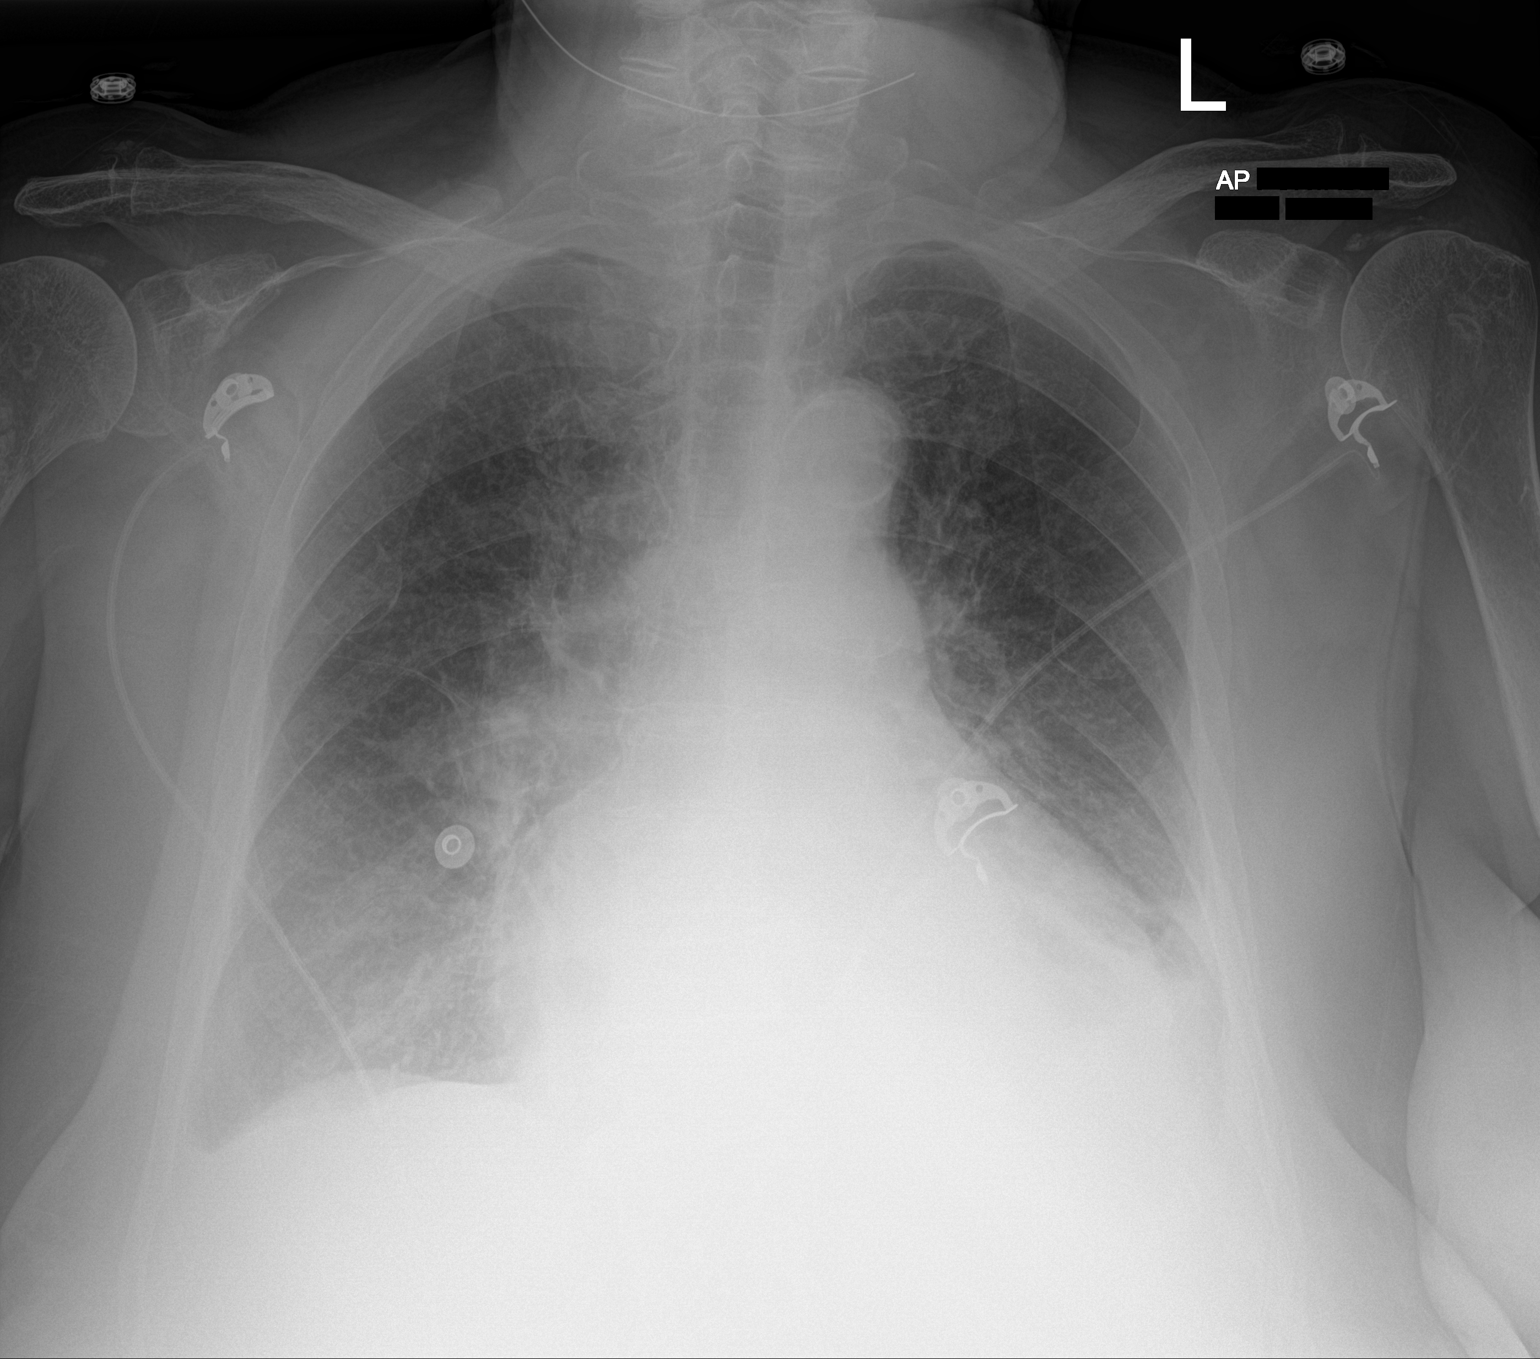

[1 of 1 positions shown; findings below may reference images not displayed]

FINDINGS: Cardiomegaly. Bilateral interstitial prominence. Interstitial edema
and/or pneumonitis could present this fashion. Left base
atelectasis/consolidation. Small bilateral pleural effusions. No
pneumothorax.
IMPRESSION: 1. Cardiomegaly.
2. Bilateral interstitial prominence. Interstitial edema and/or
pneumonitis could present this fashion. Small bilateral pleural
effusions.
3. Left base atelectasis/consolidation.

## 2022-01-18 ENCOUNTER — Other Ambulatory Visit (HOSPITAL_BASED_OUTPATIENT_CLINIC_OR_DEPARTMENT_OTHER): Payer: Self-pay | Admitting: Cardiovascular Disease

## 2022-01-21 NOTE — Telephone Encounter (Signed)
Rx(s) sent to pharmacy electronically.  

## 2022-01-24 ENCOUNTER — Telehealth: Payer: Self-pay | Admitting: Family Medicine

## 2022-01-24 NOTE — Telephone Encounter (Addendum)
Neighbor/Care Giver called to say Pt has really bad Diarrhea since Tuesday and would like to know if MD could send something to the pharmacy?  [place], [place] Phone: [phone]  Fax: [phone]     Pt is aware MD is out of the office today.  *Please Call Izora Gala, as she will be the one to pick up Rx for Pt [phone]

## 2022-01-24 NOTE — Telephone Encounter (Signed)
Linda Glass is calling back and she is aware md is not office today and will be back tomorrow

## 2022-01-25 ENCOUNTER — Ambulatory Visit (INDEPENDENT_AMBULATORY_CARE_PROVIDER_SITE_OTHER): Payer: Medicare Other | Admitting: Family Medicine

## 2022-01-25 VITALS — BP 128/76 | HR 81 | Temp 97.8°F | Wt 143.8 lb

## 2022-01-25 DIAGNOSIS — I10 Essential (primary) hypertension: Secondary | ICD-10-CM | POA: Diagnosis not present

## 2022-01-25 DIAGNOSIS — R195 Other fecal abnormalities: Secondary | ICD-10-CM

## 2022-01-25 NOTE — Progress Notes (Signed)
Subjective:    Patient ID: Linda Glass, female    DOB: [birth year], 88 y.o.   MRN: [id number]  Chief Complaint  Patient presents with   Diarrhea    Patient reports having diarrhea, started on Tuesday. C/o soreness on lowerabdomen. Can't keep down food or drink. Taken ammodium yesterday, calm sx down  a little bit.   Pt accompanied by her neighbor.  HPI Patient was seen today for acute concern.  Patient endorses loose stools since Tuesday.  Denies fever, chills, nausea, vomiting, GERD, burning sensation in stomach, blood in stools.  Patient denies changes in meds or sick contacts.  Had a [employer] sandwich on Tuesday.  Had 4 loose stools Tuesday.  Took Imodium yesterday which seems to help symptoms.  1 loose stool today.  Past Medical History:  Diagnosis Date   Anemia 04/2020   Anxiety    Arthritis    Chicken pox    COVID-19    Heart murmur    Hyperlipidemia    Hypertension     Allergies  Allergen Reactions   Losartan Other (See Comments)    diarrhea    ROS General: Denies fever, chills, night sweats, changes in weight, changes in appetite HEENT: Denies headaches, ear pain, changes in vision, rhinorrhea, sore throat CV: Denies CP, palpitations, SOB, orthopnea Pulm: Denies SOB, cough, wheezing GI: Denies abdominal pain, nausea, vomiting, diarrhea, constipation  + loose stools GU: Denies dysuria, hematuria, frequency, vaginal discharge Msk: Denies muscle cramps, joint pains Neuro: Denies weakness, numbness, tingling Skin: Denies rashes, bruising Psych: Denies depression, anxiety, hallucinations     Objective:    Blood pressure (!) 160/82, pulse 81, temperature 97.8 F (36.6 C), temperature source Oral, weight 143 lb 12.8 oz (65.2 kg), SpO2 96 %.  Gen. Pleasant, well-nourished, in no distress, normal affect   HEENT: [place]/AT, face symmetric, conjunctiva clear, no scleral icterus, PERRLA, EOMI, nares patent without drainage Lungs: no accessory muscle use, CTAB, no  wheezes or rales Cardiovascular: RRR, no m/r/g, no peripheral edema Abdomen: BS present, soft, NT/ND, no hepatosplenomegaly. Neuro:  A&Ox3, CN II-XII intact, normal gait Skin:  Warm, no lesions/ rash   Wt Readings from Last 3 Encounters:  01/25/22 143 lb 12.8 oz (65.2 kg)  10/25/21 149 lb 9.6 oz (67.9 kg)  10/17/21 151 lb (68.5 kg)    Lab Results  Component Value Date   WBC 4.7 09/21/2021   HGB 9.9 (L) 09/21/2021   HCT 29.6 (L) 09/21/2021   PLT 93 (L) 09/21/2021   GLUCOSE 109 (H) 10/15/2021   CHOL 122 02/11/2020   TRIG 145 02/11/2020   HDL 63 02/11/2020   LDLDIRECT 120.0 08/26/2017   LDLCALC 36 02/11/2020   ALT 15 09/21/2021   AST 21 09/21/2021   NA 138 10/15/2021   K 5.0 10/15/2021   CL 98 10/15/2021   CREATININE 0.92 10/15/2021   BUN 15 10/15/2021   CO2 20 10/15/2021   TSH 1.104 09/18/2021   HGBA1C 5.9 (H) 02/27/2021    Assessment/Plan:  Loose stools -Improving -Discussed possible causes including decreased gut absorption from viral etiology.  Bacterial etiology less likely but to be considered. -Continue Imodium per directions on box -Discussed eating bland foods, advance diet as tolerated -Probiotic -For continued or worsening symptoms notify clinic  Essential hypertension -BP uncontrolled in clinic -Continue current medications including Norvasc 5 mg, Toprol-XL 25 mg, Lasix 80 mg, spironolactone 25 mg -Lifestyle modifications -Hydration encouraged -Continue to monitor at home  F/u as needed for continued or worsening symptoms.  Follow-up in the next few weeks for HTN.  Grier Mitts, MD

## 2022-01-25 NOTE — Patient Instructions (Addendum)
Continue using over-the-counter Imodium to help relieve symptoms.  Use the instructions that are listed on the box.  Avoid eating heavy, greasy, spicy foods as they may make your symptoms worse.  You can drink sports drinks, rehydration solutions, or half apple juice half water to help with hydration.  Take small sips of fluids instead of drinking whole cups at a time.  Consider over-the-counter probiotic.  This can be found in the drugstore near the vitamin section.   For continued or worsening symptoms the clinic.

## 2022-01-25 NOTE — Telephone Encounter (Signed)
Patient has an appointment today

## 2022-02-04 ENCOUNTER — Encounter: Payer: Self-pay | Admitting: Family Medicine

## 2022-03-06 ENCOUNTER — Other Ambulatory Visit: Payer: Self-pay | Admitting: Family Medicine

## 2022-04-08 ENCOUNTER — Other Ambulatory Visit: Payer: Self-pay

## 2022-04-08 DIAGNOSIS — R000 Tachycardia, unspecified: Secondary | ICD-10-CM

## 2022-04-08 DIAGNOSIS — I10 Essential (primary) hypertension: Secondary | ICD-10-CM

## 2022-04-08 MED ORDER — METOPROLOL SUCCINATE ER 25 MG PO TB24: 25.0000 mg | ORAL_TABLET | Freq: Every day | ORAL | 1 refills | Status: DC

## 2022-04-09 ENCOUNTER — Telehealth: Payer: Self-pay

## 2022-04-09 NOTE — Progress Notes (Unsigned)
Patient ID: Linda Glass, female   DOB: 1933/05/25, 89 y.o.   MRN: [id number]  Care Management & Coordination Services Pharmacy Team  Reason for Encounter: Hypertension  Contacted patient to discuss hypertension disease state. {US HC Outreach:28874}    Current antihypertensive regimen:  Metoprolol succinate 25 mg 1 tablet daily - in AM  Amlodipine 5 mg  Spironolactone 25 mg 1 tablet daily  Patient verbally confirms she is taking the above medications as directed. {yes/no:20286}  How often are you checking your Blood Pressure? {CHL HP BP Monitoring Frequency:[phone]}  she checks her blood pressure {timing:25218} {before/after:25217} taking her medication.  Current home BP readings: ***  DATE:             BP               PULSE   Wrist or arm cuff: Caffeine intake: Salt intake: OTC medications including pseudoephedrine or NSAIDs?  Any readings above 180/100? {yes/no:20286} If yes any symptoms of hypertensive emergency? {hypertensive emergency symptoms:25354}  What recent interventions/DTPs have been made by any provider to improve Blood Pressure control since last CPP Visit: ***  Any recent hospitalizations or ED visits since last visit with CPP? {yes/no:20286}  What diet changes have been made to improve Blood Pressure Control?  ***  What exercise is being done to improve your Blood Pressure Control?  ***  Adherence Review: Is the patient currently on ACE/ARB medication? {yes/no:20286} Does the patient have >5 day gap between last estimated fill dates? {yes/no:20286}  Star Rating Drugs:  Atorvastatin 44m - [facility]   Chart Updates: Recent office visits:  01/25/22 BBillie Ruddy MD - Patient presented for Loose stools and other concerns. No medication changes.   Recent consult visits:  None   Hospital visits:  None in previous 6 months  Medications: Outpatient Encounter Medications as of 04/09/2022   Medication Sig   acetaminophen (TYLENOL) 325 MG tablet Take 2 tablets (650 mg total) by mouth every 6 (six) hours as needed for mild pain (or Fever >/= 101).   amLODipine (NORVASC) 5 MG tablet TAKE ONE TABLET BY MOUTH DAILY   atorvastatin (LIPITOR) 40 MG tablet TAKE 1 TABLET EACH DAY. (Patient taking differently: Take by mouth daily. TAKE 1 TABLET EACH DAY.)   citalopram (CELEXA) 20 MG tablet Take 1 tablet (20 mg total) by mouth daily.   Cyanocobalamin (VITAMIN B 12 PO) Take 500 mcg by mouth daily.   ELIQUIS 5 MG TABS tablet TAKE ONE TABLET BY MOUTH TWICE DAILY   Ferrous Gluconate 324 (37.5 Fe) MG TABS Take 324 mg by mouth every other day.   fluticasone (FLONASE) 50 MCG/ACT nasal spray Place 2 sprays into both nostrils daily.   furosemide (LASIX) 80 MG tablet Take 1 tablet (80 mg total) by mouth daily.   LORazepam (ATIVAN) 0.5 MG tablet TAKE 1/2 TABLET AT BEDTIME AS NEEDED FOR ANXIETY   magnesium oxide (MAG-OX) 400 (241.3 Mg) MG tablet Take 0.5 tablets (200 mg total) by mouth daily. (Patient taking differently: Take 400 mg by mouth daily.)   metoprolol succinate (TOPROL-XL) 25 MG 24 hr tablet Take 1 tablet (25 mg total) by mouth daily.   Multiple Vitamin (MULTIVITAMIN WITH MINERALS) TABS tablet Take 1 tablet by mouth daily.   ondansetron (ZOFRAN) 4 MG tablet Take 1 tablet (4 mg total) by mouth every 8 (eight) hours as needed for nausea or vomiting.   pantoprazole (PROTONIX) 40 MG tablet Take 1 tablet (40  mg total) by mouth daily.   spironolactone (ALDACTONE) 25 MG tablet Take 1 tablet (25 mg total) by mouth daily.   No facility-administered encounter medications on file as of 04/09/2022.    Recent Office Vitals: BP Readings from Last 3 Encounters:  01/25/22 128/76  10/25/21 138/80  10/17/21 138/80   Pulse Readings from Last 3 Encounters:  01/25/22 81  10/25/21 85  10/17/21 92    Wt Readings from Last 3 Encounters:  01/25/22 143 lb 12.8 oz (65.2 kg)  10/25/21 149 lb 9.6 oz (67.9 kg)   10/17/21 151 lb (68.5 kg)     Kidney Function Lab Results  Component Value Date/Time   CREATININE 0.92 10/15/2021 12:43 PM   CREATININE 0.50 09/21/2021 06:02 AM   CREATININE 0.53 (L) 02/11/2020 11:03 AM   GFR 80.07 11/14/2020 10:37 AM   GFRNONAA >60 09/21/2021 06:02 AM   GFRNONAA 87 02/11/2020 11:03 AM   GFRAA 101 02/11/2020 11:03 AM       Latest Ref Rng & Units 10/15/2021   12:43 PM 09/21/2021    6:02 AM 09/20/2021    5:26 AM  BMP  Glucose 70 - 99 mg/dL 109  95  105   BUN 8 - 27 mg/dL 15  7  11   $ Creatinine 0.57 - 1.00 mg/dL 0.92  0.50  0.56   BUN/Creat Ratio 12 - 28 16     Sodium 134 - 144 mmol/L 138  134  137   Potassium 3.5 - 5.2 mmol/L 5.0  4.3  3.6   Chloride 96 - 106 mmol/L 98  101  105   CO2 20 - 29 mmol/L 20  27  27   $ Calcium 8.7 - 10.3 mg/dL 9.5  8.8  8.9       Ned Clines CMA Clinical Pharmacist Assistant [phone]

## 2022-04-25 ENCOUNTER — Other Ambulatory Visit: Payer: Self-pay | Admitting: Family Medicine

## 2022-04-25 DIAGNOSIS — F419 Anxiety disorder, unspecified: Secondary | ICD-10-CM

## 2022-05-21 ENCOUNTER — Other Ambulatory Visit: Payer: Self-pay | Admitting: Family Medicine

## 2022-05-21 DIAGNOSIS — I4891 Unspecified atrial fibrillation: Secondary | ICD-10-CM

## 2022-06-17 ENCOUNTER — Other Ambulatory Visit: Payer: Self-pay | Admitting: Family Medicine

## 2022-06-17 DIAGNOSIS — E782 Mixed hyperlipidemia: Secondary | ICD-10-CM

## 2022-06-26 ENCOUNTER — Telehealth: Payer: Self-pay

## 2022-06-26 NOTE — Progress Notes (Signed)
Patient ID: Linda Glass, female   DOB: 1933-06-21, 89 y.o.   MRN: [id number]  Care Management & Coordination Services Pharmacy Team  Reason for Encounter: Outreach to patent to reschedule follow up with Milas Kocher Clinical Pharmacist due to office closure, patient aware and in agreement with new date and time.     Patient denies concerns or questions for Milas Kocher, PharmD at this time.     Chart Updates:  Recent office visits:  None   Recent consult visits:  None  Hospital visits:  None in previous 6 months  Medications: Outpatient Encounter Medications as of 06/26/2022  Medication Sig   acetaminophen (TYLENOL) 325 MG tablet Take 2 tablets (650 mg total) by mouth every 6 (six) hours as needed for mild pain (or Fever >/= 101).   amLODipine (NORVASC) 5 MG tablet TAKE ONE TABLET BY MOUTH DAILY   apixaban (ELIQUIS) 5 MG TABS tablet TAKE ONE TABLET BY MOUTH TWICE DAILY   atorvastatin (LIPITOR) 40 MG tablet TAKE 1 TABLET EACH DAY. Please call [phone] to schedule follow up appointment.   citalopram (CELEXA) 20 MG tablet Take 1 tablet (20 mg total) by mouth daily.   Cyanocobalamin (VITAMIN B 12 PO) Take 500 mcg by mouth daily.   Ferrous Gluconate 324 (37.5 Fe) MG TABS Take 324 mg by mouth every other day.   fluticasone (FLONASE) 50 MCG/ACT nasal spray Place 2 sprays into both nostrils daily.   furosemide (LASIX) 80 MG tablet Take 1 tablet (80 mg total) by mouth daily.   LORazepam (ATIVAN) 0.5 MG tablet TAKE 1/2 TABLET AT BEDTIME AS NEEDED FOR ANXIETY   magnesium oxide (MAG-OX) 400 (241.3 Mg) MG tablet Take 0.5 tablets (200 mg total) by mouth daily. (Patient taking differently: Take 400 mg by mouth daily.)   metoprolol succinate (TOPROL-XL) 25 MG 24 hr tablet Take 1 tablet (25 mg total) by mouth daily.   Multiple Vitamin (MULTIVITAMIN WITH MINERALS) TABS tablet Take 1 tablet by mouth daily.   ondansetron (ZOFRAN) 4 MG tablet Take 1 tablet (4 mg total) by mouth every 8  (eight) hours as needed for nausea or vomiting.   pantoprazole (PROTONIX) 40 MG tablet Take 1 tablet (40 mg total) by mouth daily.   spironolactone (ALDACTONE) 25 MG tablet Take 1 tablet (25 mg total) by mouth daily.   No facility-administered encounter medications on file as of 06/26/2022.    Recent vitals BP Readings from Last 3 Encounters:  01/25/22 128/76  10/25/21 138/80  10/17/21 138/80   Pulse Readings from Last 3 Encounters:  01/25/22 81  10/25/21 85  10/17/21 92   Wt Readings from Last 3 Encounters:  01/25/22 143 lb 12.8 oz (65.2 kg)  10/25/21 149 lb 9.6 oz (67.9 kg)  10/17/21 151 lb (68.5 kg)   BMI Readings from Last 3 Encounters:  01/25/22 27.17 kg/m  10/25/21 28.27 kg/m  10/17/21 28.53 kg/m    Recent lab results    Component Value Date/Time   NA 138 10/15/2021 1243   K 5.0 10/15/2021 1243   CL 98 10/15/2021 1243   CO2 20 10/15/2021 1243   GLUCOSE 109 (H) 10/15/2021 1243   GLUCOSE 95 09/21/2021 0602   BUN 15 10/15/2021 1243   CREATININE 0.92 10/15/2021 1243   CREATININE 0.53 (L) 02/11/2020 1103   CALCIUM 9.5 10/15/2021 1243    Lab Results  Component Value Date   CREATININE 0.92 10/15/2021   GFR 80.07 11/14/2020   EGFR 61 10/15/2021   GFRNONAA >60 09/21/2021  GFRAA 101 02/11/2020   Lab Results  Component Value Date/Time   HGBA1C 5.9 (H) 02/27/2021 11:22 AM    Lab Results  Component Value Date   CHOL 122 02/11/2020   HDL 63 02/11/2020   LDLCALC 36 02/11/2020   LDLDIRECT 120.0 08/26/2017   TRIG 145 02/11/2020   CHOLHDL 1.9 02/11/2020    Care Gaps: TDAP - Overdue Zoster Vaccine - Overdue COVID Booster - Overdue   Star Rating Drugs:  Atorvastatin 40mg  - Last filled 06/17/22 90 DS at [facility]      Pamala Duffel CMA Clinical Pharmacist Assistant [phone]

## 2022-07-17 ENCOUNTER — Other Ambulatory Visit: Payer: Self-pay | Admitting: Family Medicine

## 2022-07-17 DIAGNOSIS — E782 Mixed hyperlipidemia: Secondary | ICD-10-CM

## 2022-07-19 ENCOUNTER — Other Ambulatory Visit: Payer: Self-pay | Admitting: Family Medicine

## 2022-07-19 MED ORDER — LORAZEPAM 0.5 MG PO TABS
ORAL_TABLET | ORAL | 0 refills | Status: DC
Start: 2022-07-19 — End: 2022-07-23

## 2022-07-19 NOTE — Telephone Encounter (Signed)
I scheduled patient for Tuesday, can you go ahead and send Rx in? Her daughter will be in town to help her with visit Tuesday.

## 2022-07-19 NOTE — Telephone Encounter (Signed)
Prescription Request  07/19/2022  LOV: 10/17/2021    What is the name of the medication or equipment?  LORazepam (ATIVAN) 0.5 MG tablet  *leaving for a trip out of town tomorrow, asking for a call to confirm when it's been sent  Have you contacted your pharmacy to request a refill? Yes   Which pharmacy would you like this sent to?  [facility] [place], [place] - [address] [place] [address] Phone: [phone] Fax: [phone]    Patient notified that their request is being sent to the clinical staff for review and that they should receive a response within 2 business days.   Please advise at [facility] [phone]

## 2022-07-23 ENCOUNTER — Telehealth (INDEPENDENT_AMBULATORY_CARE_PROVIDER_SITE_OTHER): Payer: Medicare Other | Admitting: Family Medicine

## 2022-07-23 ENCOUNTER — Encounter: Payer: Self-pay | Admitting: Family Medicine

## 2022-07-23 VITALS — BP 138/88 | HR 84 | Ht 61.0 in

## 2022-07-23 DIAGNOSIS — G4700 Insomnia, unspecified: Secondary | ICD-10-CM

## 2022-07-23 DIAGNOSIS — D6859 Other primary thrombophilia: Secondary | ICD-10-CM

## 2022-07-23 DIAGNOSIS — I4819 Other persistent atrial fibrillation: Secondary | ICD-10-CM

## 2022-07-23 DIAGNOSIS — I10 Essential (primary) hypertension: Secondary | ICD-10-CM

## 2022-07-23 DIAGNOSIS — F419 Anxiety disorder, unspecified: Secondary | ICD-10-CM | POA: Diagnosis not present

## 2022-07-23 MED ORDER — CITALOPRAM HYDROBROMIDE 20 MG PO TABS: 20.0000 mg | ORAL_TABLET | Freq: Every day | ORAL | 2 refills | Status: DC

## 2022-07-23 MED ORDER — LORAZEPAM 0.5 MG PO TABS: 0.2500 mg | ORAL_TABLET | Freq: Every evening | ORAL | 3 refills | Status: DC | PRN

## 2022-07-23 NOTE — Assessment & Plan Note (Signed)
BP adequately controlled. Continue amlodipine 5 mg daily and spironolactone 25 mg daily as well as low-salt diet. Continue monitoring BP regularly.

## 2022-07-23 NOTE — Assessment & Plan Note (Signed)
Problem is stable, adequately controlled. Continue citalopram 20 mg daily and lorazepam 0.5 mg 1/2 tablet to 1 tablet daily as needed. Follow-up in 6 months, before if needed.

## 2022-07-23 NOTE — Progress Notes (Signed)
Virtual Visit via Video Note I connected with Linda Glass on 07/27/22 by a video enabled telemedicine application and verified that I am speaking with the correct person using two identifiers. Location patient: home Location provider:work office Persons participating in the virtual visit: patient, her daughter, and provider  I discussed the limitations of evaluation and management by telemedicine and the availability of in person appointments. The patient expressed understanding and agreed to proceed.  Chief Complaint  Patient presents with   Medical Management of Chronic Issues    HPI: Linda Glass is a 89 years old female with history of hyperlipidemia, hypertension, atrial fibrillation on chronic anticoagulation, diastolic dysfunction, and aortic atherosclerosis being seen today for follow-up. Since her last visit, 10/17/2021, she has been evaluated in the office for anxiety on 10/25/2021 and diarrhea on 01/25/2022. Diarrhea has resolved. It was thought to be caused by Losartan. Negative for abdominal pain,nausea,vomiting,blood in stool, or melena.  For HTN she is on Amlodipine 5 mg daily and Spironolactone 25 mg daily. She monitors her BP regularly, usually 120-130/70's. Negative for severe/frequent headache, visual changes, chest pain, dyspnea,focal weakness, or edema.  Lab Results  Component Value Date   CREATININE 0.92 10/15/2021   BUN 15 10/15/2021   NA 138 10/15/2021   K 5.0 10/15/2021   CL 98 10/15/2021   CO2 20 10/15/2021   Atrial fib on Eliquis 5 mg bid. Last follow up with cardiologist on 10/08/21.  She is on Celexa 20 mg daily and Lorazepam 0.5 mg daily as needed. She has tolerated medication well and she feels like they are helping with problem. Lorazepam helps her to sleep better, she is sleeping about 6-7 hours.  Denies depressed mood. Her daughter is staying with her at this time, she is on vacation. Lives out of town and planning on moving to  [place] in the next few months.  ROS: See pertinent positives and negatives per HPI.  Past Medical History:  Diagnosis Date   Anemia 04/2020   Anxiety    Arthritis    Chicken pox    COVID-19    Heart murmur    Hyperlipidemia    Hypertension     Past Surgical History:  Procedure Laterality Date   ABDOMINAL HYSTERECTOMY  1983   BIOPSY  05/05/2020   Procedure: BIOPSY;  Surgeon: Napoleon Form, MD;  Location: [facility];  Service: Endoscopy;;  EGD and COLON   COLONOSCOPY WITH PROPOFOL N/A 05/05/2020   Procedure: COLONOSCOPY WITH PROPOFOL;  Surgeon: Napoleon Form, MD;  Location: [facility];  Service: Endoscopy;  Laterality: N/A;   ESOPHAGOGASTRODUODENOSCOPY (EGD) WITH PROPOFOL N/A 05/05/2020   Procedure: ESOPHAGOGASTRODUODENOSCOPY (EGD) WITH PROPOFOL;  Surgeon: Napoleon Form, MD;  Location: [facility];  Service: Endoscopy;  Laterality: N/A;   FRACTURE SURGERY     wrist fracture 2013   LAPAROSCOPIC PARTIAL COLECTOMY Right 03/12/2021   Procedure: LAPAROSCOPIC HAND ASSISTED RIGHT COLECTOMY;  Surgeon: Quentin Ore, MD;  Location: [facility];  Service: General;  Laterality: Right;    Family History  Problem Relation Age of Onset   Hyperlipidemia Mother    Diabetes Neg Hx     Social History   Socioeconomic History   Marital status: Widowed    Spouse name: Not on file   Number of children: 2   Years of education: Not on file   Highest education level: Not on file  Occupational History   Not on file  Tobacco Use   Smoking status: Never  Smokeless tobacco: Never  Vaping Use   Vaping Use: Never used  Substance and Sexual Activity   Alcohol use: Not Currently    Alcohol/week: 7.0 standard drinks of alcohol    Types: 7 Glasses of wine per week    Comment: Occasional   Drug use: No   Sexual activity: Not Currently  Other Topics Concern   Not on file  Social History Narrative   Not on file   Social Determinants of Health   Financial  Resource Strain: Low Risk  (03/26/2021)   Overall Financial Resource Strain (CARDIA)    Difficulty of Paying Living Expenses: Not hard at all  Food Insecurity: No Food Insecurity (03/26/2021)   Hunger Vital Sign    Worried About Running Out of Food in the Last Year: Never true    Ran Out of Food in the Last Year: Never true  Transportation Needs: No Transportation Needs (03/26/2021)   PRAPARE - Administrator, Civil Service (Medical): No    Lack of Transportation (Non-Medical): No  Physical Activity: Inactive (03/26/2021)   Exercise Vital Sign    Days of Exercise per Week: 0 days    Minutes of Exercise per Session: 0 min  Stress: No Stress Concern Present (03/26/2021)   [employer] of Occupational Health - Occupational Stress Questionnaire    Feeling of Stress : Not at all  Social Connections: Not on file  Intimate Partner Violence: Not on file    Current Outpatient Medications:    acetaminophen (TYLENOL) 325 MG tablet, Take 2 tablets (650 mg total) by mouth every 6 (six) hours as needed for mild pain (or Fever >/= 101)., Disp: , Rfl:    amLODipine (NORVASC) 5 MG tablet, TAKE ONE TABLET BY MOUTH DAILY, Disp: 90 tablet, Rfl: 2   apixaban (ELIQUIS) 5 MG TABS tablet, TAKE ONE TABLET BY MOUTH TWICE DAILY, Disp: 60 tablet, Rfl: 3   atorvastatin (LIPITOR) 40 MG tablet, TAKE 1 TABLET EACH DAY. Please call [phone] to schedule follow up appointment., Disp: 30 tablet, Rfl: 0   Cyanocobalamin (VITAMIN B 12 PO), Take 500 mcg by mouth daily., Disp: , Rfl:    Ferrous Gluconate 324 (37.5 Fe) MG TABS, Take 324 mg by mouth every other day., Disp: , Rfl:    fluticasone (FLONASE) 50 MCG/ACT nasal spray, Place 2 sprays into both nostrils daily., Disp: 16 g, Rfl: 2   furosemide (LASIX) 80 MG tablet, Take 1 tablet (80 mg total) by mouth daily., Disp: 90 tablet, Rfl: 2   magnesium oxide (MAG-OX) 400 (241.3 Mg) MG tablet, Take 0.5 tablets (200 mg total) by mouth daily. (Patient taking  differently: Take 400 mg by mouth daily.), Disp: , Rfl:    metoprolol succinate (TOPROL-XL) 25 MG 24 hr tablet, Take 1 tablet (25 mg total) by mouth daily., Disp: 90 tablet, Rfl: 1   Multiple Vitamin (MULTIVITAMIN WITH MINERALS) TABS tablet, Take 1 tablet by mouth daily., Disp: , Rfl:    ondansetron (ZOFRAN) 4 MG tablet, Take 1 tablet (4 mg total) by mouth every 8 (eight) hours as needed for nausea or vomiting., Disp: 20 tablet, Rfl: 0   pantoprazole (PROTONIX) 40 MG tablet, Take 1 tablet (40 mg total) by mouth daily., Disp: 90 tablet, Rfl: 2   spironolactone (ALDACTONE) 25 MG tablet, Take 1 tablet (25 mg total) by mouth daily., Disp: 90 tablet, Rfl: 3   citalopram (CELEXA) 20 MG tablet, Take 1 tablet (20 mg total) by mouth daily., Disp: 90 tablet, Rfl: 2  LORazepam (ATIVAN) 0.5 MG tablet, Take 0.5-1 tablets (0.25-0.5 mg total) by mouth at bedtime as needed for anxiety. TAKE 1/2 TABLET AT BEDTIME AS NEEDED FOR ANXIETY, Disp: 30 tablet, Rfl: 3  EXAM:  VITALS per patient if applicable:BP 138/88   Pulse 84   Ht 5\' 1"  (1.549 m)   BMI 27.17 kg/m   GENERAL: alert, oriented, appears well and in no acute distress  HEENT: atraumatic, conjunctiva clear, no obvious abnormalities on inspection.  NECK: normal movements of the head and neck  LUNGS: on inspection no signs of respiratory distress, breathing rate appears normal, no obvious gross SOB, gasping or wheezing  CV: no obvious cyanosis  MS: moves all visible extremities without noticeable abnormality  PSYCH/NEURO: pleasant and cooperative, no obvious depression or anxiety, speech and thought processing grossly intact  ASSESSMENT AND PLAN:  Discussed the following assessment and plan:  Insomnia, unspecified type Assessment & Plan: Problem is stable. Lorazepam 0.5 mg 1/2 to 1 tablet at bedtime is still helping. Continue good sleep hygiene.   Anxiety disorder, unspecified type Assessment & Plan: Problem is stable, adequately  controlled. Continue citalopram 20 mg daily and lorazepam 0.5 mg 1/2 tablet to 1 tablet daily as needed. Follow-up in 6 months, before if needed.  Orders: -     Citalopram Hydrobromide; Take 1 tablet (20 mg total) by mouth daily.  Dispense: 90 tablet; Refill: 2 -     LORazepam; Take 0.5-1 tablets (0.25-0.5 mg total) by mouth at bedtime as needed for anxiety. TAKE 1/2 TABLET AT BEDTIME AS NEEDED FOR ANXIETY  Dispense: 30 tablet; Refill: 3  Hypertension, essential, benign Assessment & Plan: BP adequately controlled. Continue amlodipine 5 mg daily and spironolactone 25 mg daily as well as low-salt diet. Continue monitoring BP regularly.   Persistent atrial fibrillation (HCC) Assessment & Plan: Asymptomatic. Currently on Eliquis 5 mg bid. Follows with cardiologist.   Hypercoagulable state [facility]) Assessment & Plan: Atrial fib on Eliquis 5 mg bid.   We discussed possible serious and likely etiologies, options for evaluation and workup, limitations of telemedicine visit vs in person visit, treatment, treatment risks and precautions. The patient was advised to call back or seek an in-person evaluation if the symptoms worsen or if the condition fails to improve as anticipated. I discussed the assessment and treatment plan with the patient. The patient was provided an opportunity to ask questions and all were answered. The patient agreed with the plan and demonstrated an understanding of the instructions.  Return in about 6 months (around 01/23/2023) for chronic problems. Gregery Walberg G. [name], MD  [facility]. [place] office.

## 2022-07-23 NOTE — Assessment & Plan Note (Signed)
Problem is stable. Lorazepam 0.5 mg 1/2 to 1 tablet at bedtime is still helping. Continue good sleep hygiene.

## 2022-07-26 IMAGING — CT CT ABD-PELV W/ CM
2 of 5 series · 15 of 46 positions shown, 17 images · IV contrast (APPLIED)
Comparison: None.

CLINICAL DATA: Iron deficiency anemia. Cecal polyp found on
colonoscopy.

EXAM:
CT ABDOMEN AND PELVIS WITH CONTRAST
TECHNIQUE: Multidetector CT imaging of the abdomen and pelvis was performed
using the standard protocol following bolus administration of
intravenous contrast.
CONTRAST:  80mL OMNIPAQUE IOHEXOL 350 MG/ML SOLN

[Series 2: axial st · axial · 0.90mm/px · z∈[-741,-341]mm · 12 of 94 slices shown, 14 images]
[im 7/94  soft-tissue]
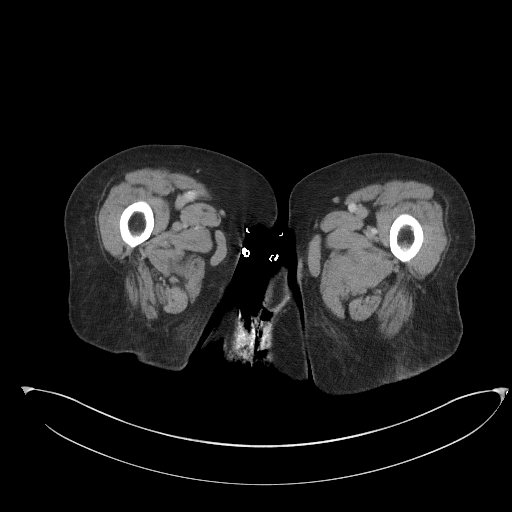
[im 7/94  bone]
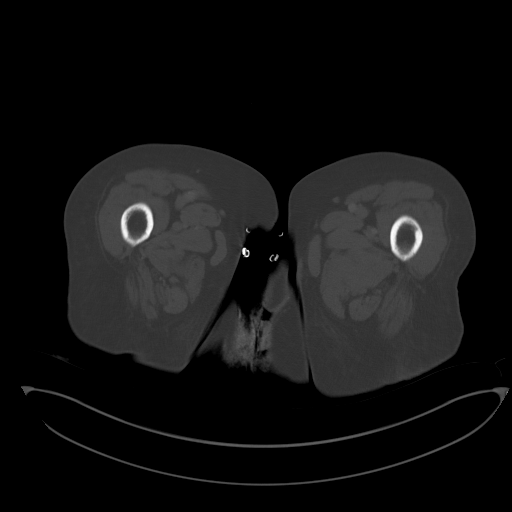
[im 14/94  soft-tissue]
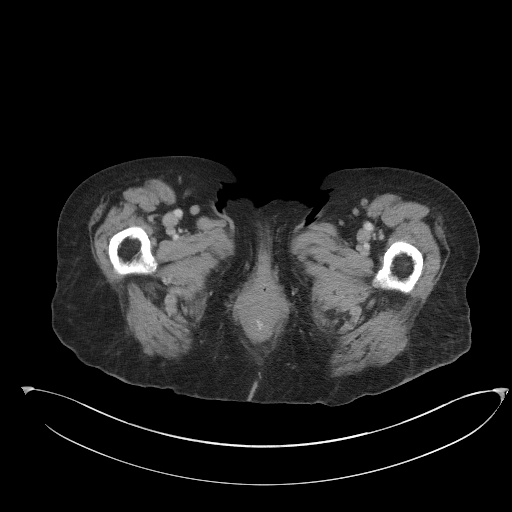
[im 20/94  soft-tissue]
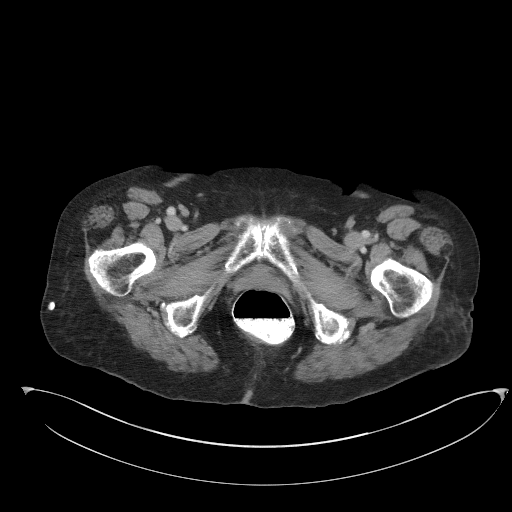
[im 27/94  soft-tissue]
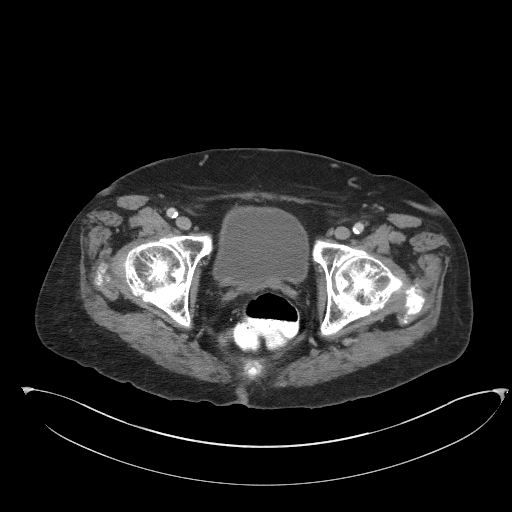
[im 34/94  soft-tissue]
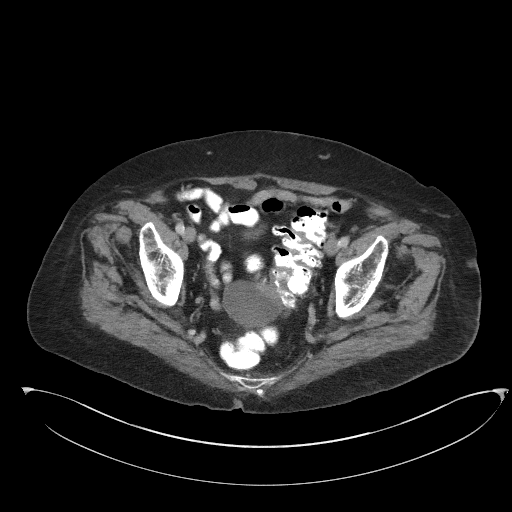
[im 40/94  soft-tissue]
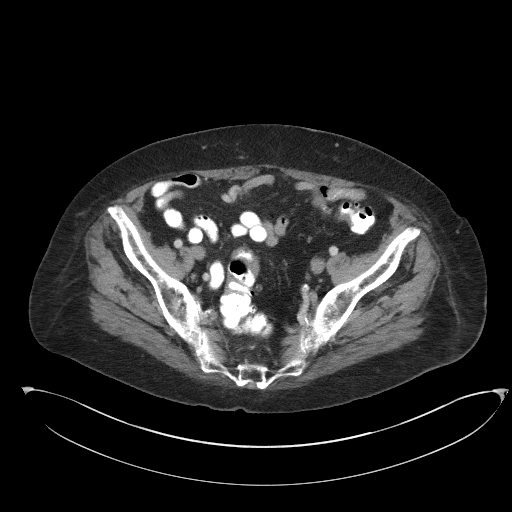
[im 54/94  soft-tissue]
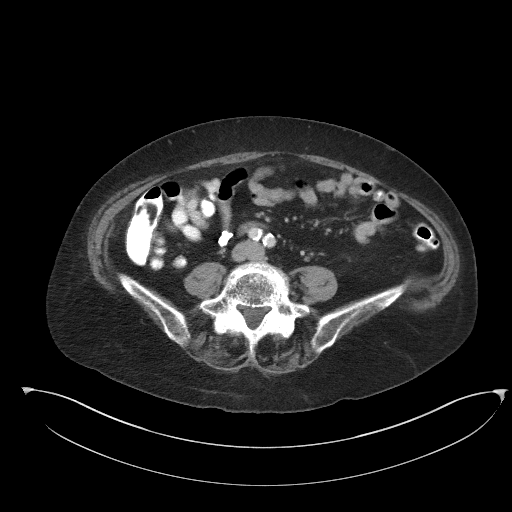
[im 60/94  soft-tissue]
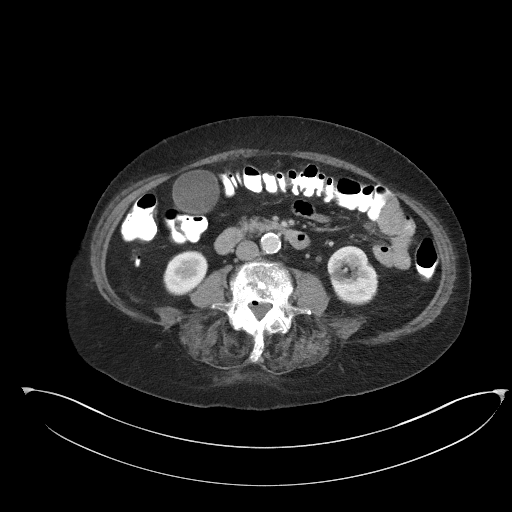
[im 67/94  soft-tissue]
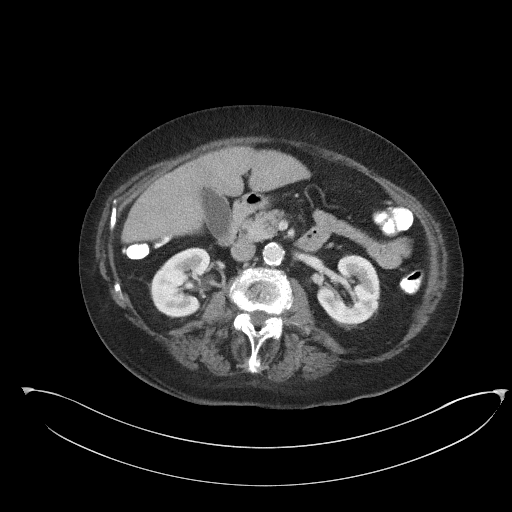
[im 67/94  bone]
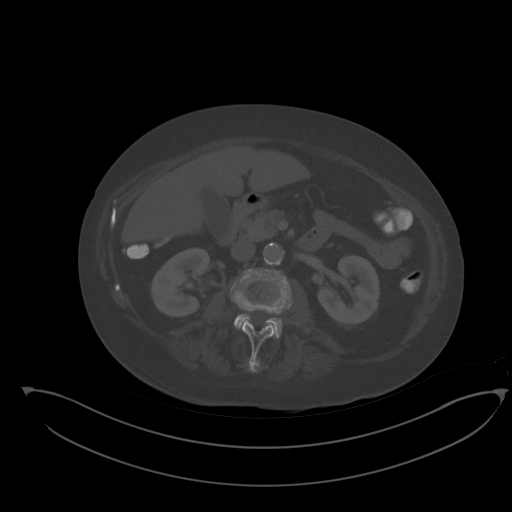
[im 74/94  soft-tissue]
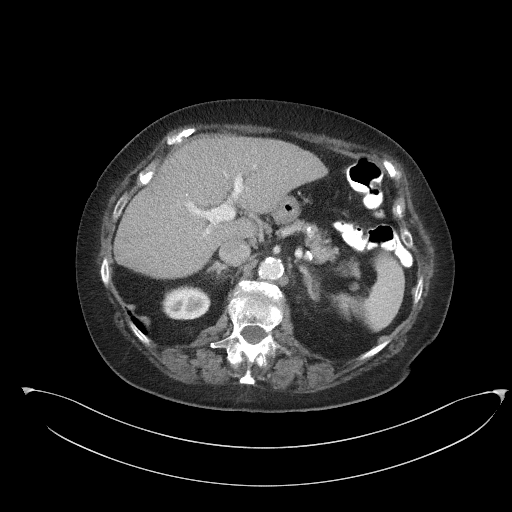
[im 80/94  soft-tissue]
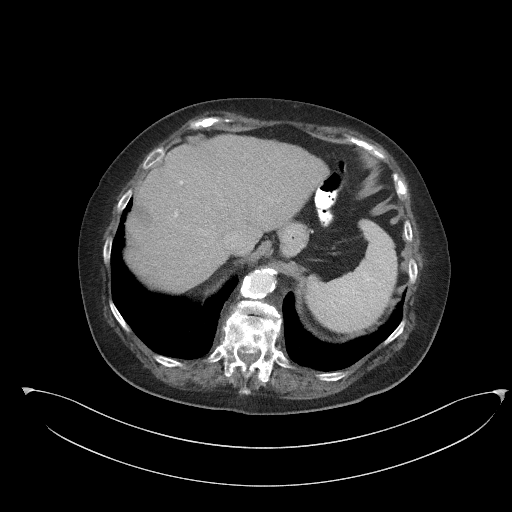
[im 87/94  soft-tissue]
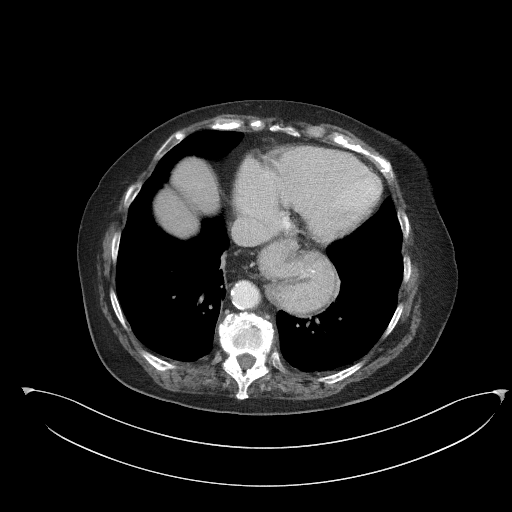

[Series 5: coronal st · coronal · 0.74mm/px · 3 of 101 slices shown]
[im 34/101  soft-tissue]
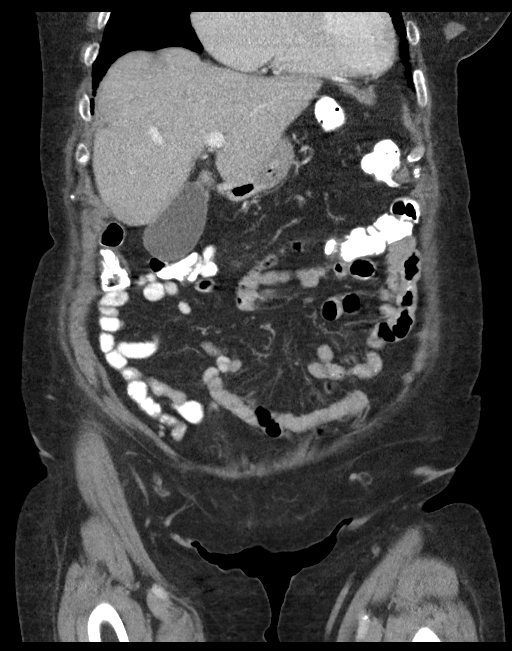
[im 45/101  soft-tissue]
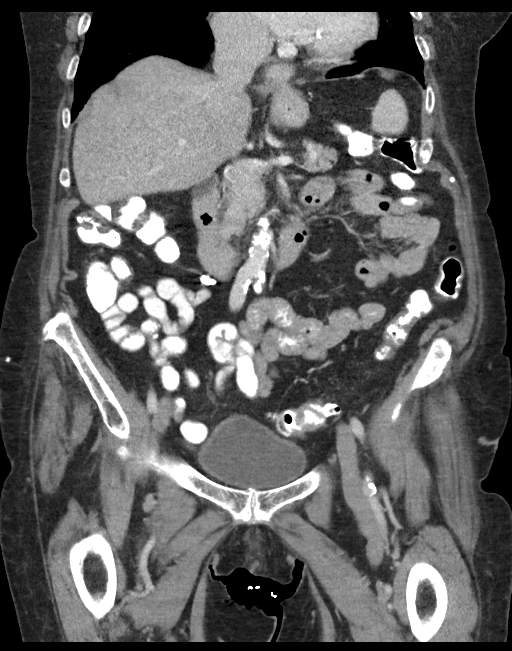
[im 56/101  soft-tissue]
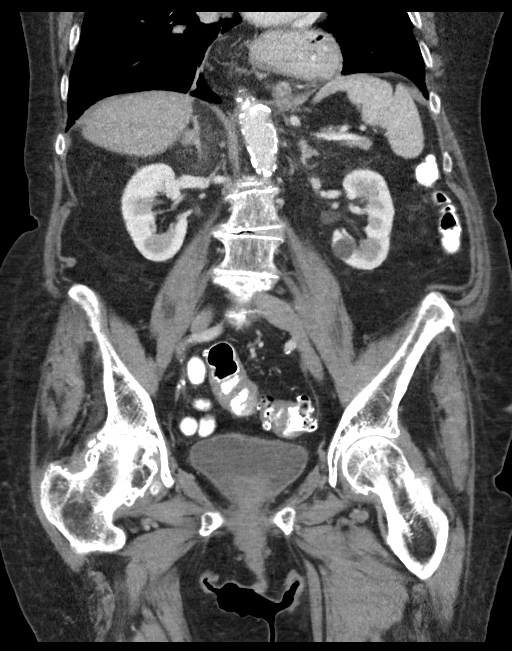

[15 of 46 positions shown; findings below may reference images not displayed]

FINDINGS: Lower Chest: No acute findings.

Hepatobiliary: No hepatic masses identified. Gallbladder is
unremarkable. No evidence of biliary ductal dilatation.

Pancreas:  No mass or inflammatory changes.

Spleen: Within normal limits in size and appearance.

Adrenals/Urinary Tract: No masses identified. Small left lower pole
renal cyst noted. No evidence of ureteral calculi or hydronephrosis.
Unremarkable unopacified urinary bladder.

Stomach/Bowel: No colonic or other soft tissue masses are
identified. No evidence of obstruction, inflammatory process or
abnormal fluid collections. Diverticulosis is seen mainly involving
the sigmoid colon, however there is no evidence of diverticulitis. A
large hiatal hernia is seen containing the majority of the stomach.

Vascular/Lymphatic: No pathologically enlarged lymph nodes. No acute
vascular findings. Aortic atherosclerotic calcification noted.

Reproductive: Prior hysterectomy noted. A simple appearing cyst is
seen in the central pelvis which measures 5.4 x 3.9 cm. This is
suspicious for an ovarian cyst, with postop fluid collection
considered less likely. No evidence of free fluid.

Other:  None.

Musculoskeletal: No suspicious bone lesions identified. Old
compression fracture deformity the T12 vertebral body is
incidentally noted.
IMPRESSION: No evidence of abdominal or pelvic metastatic disease.

Sigmoid colonic diverticulosis, without radiographic evidence of
diverticulitis.

Large hiatal hernia containing majority of the stomach.

5.4 cm benign-appearing cyst in central pelvis, suspicious for
ovarian cyst, with postop fluid collection considered less likely.
Recommend follow-up US in 6-12 months. Note: This recommendation
does not apply to premenarchal patients and to those with increased
risk (genetic, family history, elevated tumor markers or other
high-risk factors) of ovarian cancer. Reference: JACR [DATE];

Aortic Atherosclerosis ([id number]).

## 2022-07-27 DIAGNOSIS — D6859 Other primary thrombophilia: Secondary | ICD-10-CM | POA: Insufficient documentation

## 2022-07-27 NOTE — Assessment & Plan Note (Signed)
Atrial fib on Eliquis 5 mg bid.

## 2022-07-27 NOTE — Assessment & Plan Note (Addendum)
Asymptomatic. Currently on Eliquis 5 mg bid. Follows with cardiologist.

## 2022-08-01 ENCOUNTER — Encounter: Payer: Medicare Other | Admitting: Family Medicine

## 2022-08-16 ENCOUNTER — Other Ambulatory Visit: Payer: Self-pay | Admitting: Family Medicine

## 2022-08-16 DIAGNOSIS — E782 Mixed hyperlipidemia: Secondary | ICD-10-CM

## 2022-08-21 ENCOUNTER — Ambulatory Visit (INDEPENDENT_AMBULATORY_CARE_PROVIDER_SITE_OTHER): Payer: Medicare Other

## 2022-08-21 VITALS — Ht 61.0 in | Wt 143.0 lb

## 2022-08-21 DIAGNOSIS — Z0000 Encounter for general adult medical examination without abnormal findings: Secondary | ICD-10-CM | POA: Diagnosis not present

## 2022-08-21 NOTE — Progress Notes (Signed)
Subjective:   Linda Glass is a 89 y.o. female who presents for Medicare Annual (Subsequent) preventive examination.  Visit Complete: Virtual  I connected with  Linda Glass on 08/21/22 by a audio enabled telemedicine application and verified that I am speaking with the correct person using two identifiers.  Patient Location: Home  Provider Location: Home Office  I discussed the limitations of evaluation and management by telemedicine. The patient expressed understanding and agreed to proceed.  Patient Medicare AWV questionnaire was completed by the patient on 08/19/22; I have confirmed that all information answered by patient is correct and no changes since this date.  Review of Systems     Cardiac Risk Factors include: advanced age (>52men, >69 women);hypertension     Objective:    Today's Vitals   08/21/22 0956  Weight: 143 lb (64.9 kg)  Height: 5\' 1"  (1.549 m)   Body mass index is 27.02 kg/m.     08/21/2022   10:06 AM 09/17/2021    4:39 PM 03/26/2021   10:34 AM 03/12/2021   11:00 AM 02/27/2021   11:07 AM 05/01/2020    7:46 PM 05/01/2020    5:44 PM  Advanced Directives  Does Patient Have a Medical Advance Directive? Yes No No No No No No  Type of Estate agent of [place];Living will        Copy of Healthcare Power of Attorney in Chart? No - copy requested        Would patient like information on creating a medical advance directive?  No - Patient declined  Yes (MAU/Ambulatory/Procedural Areas - Information given) Yes (MAU/Ambulatory/Procedural Areas - Information given) No - Patient declined No - Patient declined    Current Medications (verified) Outpatient Encounter Medications as of 08/21/2022  Medication Sig   acetaminophen (TYLENOL) 325 MG tablet Take 2 tablets (650 mg total) by mouth every 6 (six) hours as needed for mild pain (or Fever >/= 101).   amLODipine (NORVASC) 5 MG tablet TAKE ONE TABLET BY MOUTH DAILY    apixaban (ELIQUIS) 5 MG TABS tablet TAKE ONE TABLET BY MOUTH TWICE DAILY   atorvastatin (LIPITOR) 40 MG tablet TAKE 1 TABLET EACH DAY.   citalopram (CELEXA) 20 MG tablet Take 1 tablet (20 mg total) by mouth daily.   Cyanocobalamin (VITAMIN B 12 PO) Take 500 mcg by mouth daily.   Ferrous Gluconate 324 (37.5 Fe) MG TABS Take 324 mg by mouth every other day.   fluticasone (FLONASE) 50 MCG/ACT nasal spray Place 2 sprays into both nostrils daily.   furosemide (LASIX) 80 MG tablet Take 1 tablet (80 mg total) by mouth daily.   LORazepam (ATIVAN) 0.5 MG tablet Take 0.5-1 tablets (0.25-0.5 mg total) by mouth at bedtime as needed for anxiety. TAKE 1/2 TABLET AT BEDTIME AS NEEDED FOR ANXIETY   magnesium oxide (MAG-OX) 400 (241.3 Mg) MG tablet Take 0.5 tablets (200 mg total) by mouth daily. (Patient taking differently: Take 400 mg by mouth daily.)   metoprolol succinate (TOPROL-XL) 25 MG 24 hr tablet Take 1 tablet (25 mg total) by mouth daily.   Multiple Vitamin (MULTIVITAMIN WITH MINERALS) TABS tablet Take 1 tablet by mouth daily.   ondansetron (ZOFRAN) 4 MG tablet Take 1 tablet (4 mg total) by mouth every 8 (eight) hours as needed for nausea or vomiting.   pantoprazole (PROTONIX) 40 MG tablet Take 1 tablet (40 mg total) by mouth daily.   spironolactone (ALDACTONE) 25 MG tablet Take 1 tablet (25 mg total)  by mouth daily.   No facility-administered encounter medications on file as of 08/21/2022.    Allergies (verified) Losartan   History: Past Medical History:  Diagnosis Date   Anemia 04/2020   Anxiety    Arthritis    Chicken pox    COVID-19    Heart murmur    Hyperlipidemia    Hypertension    Past Surgical History:  Procedure Laterality Date   ABDOMINAL HYSTERECTOMY  1983   BIOPSY  05/05/2020   Procedure: BIOPSY;  Surgeon: Napoleon Form, MD;  Location: [facility];  Service: Endoscopy;;  EGD and COLON   COLONOSCOPY WITH PROPOFOL N/A 05/05/2020   Procedure: COLONOSCOPY WITH PROPOFOL;   Surgeon: Napoleon Form, MD;  Location: [facility];  Service: Endoscopy;  Laterality: N/A;   ESOPHAGOGASTRODUODENOSCOPY (EGD) WITH PROPOFOL N/A 05/05/2020   Procedure: ESOPHAGOGASTRODUODENOSCOPY (EGD) WITH PROPOFOL;  Surgeon: Napoleon Form, MD;  Location: [facility];  Service: Endoscopy;  Laterality: N/A;   FRACTURE SURGERY     wrist fracture 2013   LAPAROSCOPIC PARTIAL COLECTOMY Right 03/12/2021   Procedure: LAPAROSCOPIC HAND ASSISTED RIGHT COLECTOMY;  Surgeon: Quentin Ore, MD;  Location: [facility];  Service: General;  Laterality: Right;   Family History  Problem Relation Age of Onset   Hyperlipidemia Mother    Diabetes Neg Hx    Social History   Socioeconomic History   Marital status: Widowed    Spouse name: Not on file   Number of children: 2   Years of education: Not on file   Highest education level: Not on file  Occupational History   Not on file  Tobacco Use   Smoking status: Never   Smokeless tobacco: Never  Vaping Use   Vaping Use: Never used  Substance and Sexual Activity   Alcohol use: Not Currently    Alcohol/week: 7.0 standard drinks of alcohol    Types: 7 Glasses of wine per week    Comment: Occasional   Drug use: No   Sexual activity: Not Currently  Other Topics Concern   Not on file  Social History Narrative   Not on file   Social Determinants of Health   Financial Resource Strain: Low Risk  (08/21/2022)   Overall Financial Resource Strain (CARDIA)    Difficulty of Paying Living Expenses: Not hard at all  Food Insecurity: No Food Insecurity (08/21/2022)   Hunger Vital Sign    Worried About Running Out of Food in the Last Year: Never true    Ran Out of Food in the Last Year: Never true  Transportation Needs: No Transportation Needs (08/21/2022)   PRAPARE - Administrator, Civil Service (Medical): No    Lack of Transportation (Non-Medical): No  Physical Activity: Insufficiently Active (08/21/2022)   Exercise Vital Sign     Days of Exercise per Week: 2 days    Minutes of Exercise per Session: 20 min  Stress: No Stress Concern Present (08/21/2022)   [employer] of Occupational Health - Occupational Stress Questionnaire    Feeling of Stress : Not at all  Social Connections: Socially Isolated (08/21/2022)   Social Connection and Isolation Panel [NHANES]    Frequency of Communication with Friends and Family: More than three times a week    Frequency of Social Gatherings with Friends and Family: More than three times a week    Attends Religious Services: Never    Database administrator or Organizations: No    Attends Banker Meetings: Never  Marital Status: Widowed    Tobacco Counseling Counseling given: Not Answered   Clinical Intake:  Pre-visit preparation completed: Yes  Pain : No/denies pain     BMI - recorded: 27.02 Nutritional Status: BMI 25 -29 Overweight Nutritional Risks: None Diabetes: No  How often do you need to have someone help you when you read instructions, pamphlets, or other written materials from your doctor or pharmacy?: 1 - Never  Interpreter Needed?: No  Information entered by :: Theresa Mulligan LPN   Activities of Daily Living    08/21/2022   10:03 AM 08/19/2022    1:54 PM  In your present state of health, do you have any difficulty performing the following activities:  Hearing? 1 1  Comment Wears hearing aids   Vision? 0 0  Difficulty concentrating or making decisions? 0 0  Walking or climbing stairs? 1 1  Comment Uses walker and cane   Dressing or bathing? 0 0  Doing errands, shopping? 1 1  Preparing Food and eating ? N N  Using the Toilet? N N  In the past six months, have you accidently leaked urine? N N  Do you have problems with loss of bowel control? N N  Managing your Medications? N N  Managing your Finances? N N  Housekeeping or managing your Housekeeping? N N    Patient Care Team: [name], [name], MD as PCP - General (Family  Medicine) Chilton Si, MD as PCP - Cardiology (Cardiology) Verner Chol, [facility] (Inactive) as Pharmacist (Pharmacist)  Indicate any recent Medical Services you may have received from other than [facility] providers in the past year (date may be approximate).     Assessment:   This is a routine wellness examination for [place].  Hearing/Vision screen Hearing Screening - Comments:: Wears hearing aids Vision Screening - Comments:: Wears reading glasses - up to date with routine eye exams with  [facility]  Dietary issues and exercise activities discussed:     Goals Addressed               This Visit's Progress     Stay Healthy (pt-stated)         Depression Screen    08/21/2022   10:02 AM 01/25/2022    2:38 PM 10/17/2021   10:48 AM 10/11/2021    3:56 PM 03/26/2021   10:35 AM 01/05/2021   11:44 PM 02/11/2020   10:12 AM  PHQ 2/9 Scores  PHQ - 2 Score 0 0 0 0 0 0 0  PHQ- 9 Score  3 0 0       Fall Risk    08/21/2022   10:05 AM 08/19/2022    1:54 PM 07/23/2022    4:09 PM 01/25/2022    2:39 PM 10/17/2021   10:48 AM  Fall Risk   Falls in the past year? 0 1 0 0 1  Number falls in past yr: 0 0 0 0 0  Injury with Fall? 0 0 0 0 1  Comment  Followed by medical attention     Risk for fall due to : No Fall Risks  Other (Comment) No Fall Risks History of fall(s)  Follow up Falls prevention discussed  Falls evaluation completed Falls evaluation completed Falls evaluation completed    MEDICARE RISK AT HOME:  Medicare Risk at Home - 08/21/22 1011     Any stairs in or around the home? No    If so, are there any without handrails? No  Home free of loose throw rugs in walkways, pet beds, electrical cords, etc? Yes    Adequate lighting in your home to reduce risk of falls? Yes    Life alert? No    Use of a cane, walker or w/c? No    Grab bars in the bathroom? Yes    Shower chair or bench in shower? Yes    Elevated toilet seat or a handicapped toilet? No              TIMED UP AND GO:  Was the test performed?  No    Cognitive Function:        08/21/2022   10:07 AM  6CIT Screen  What Year? 0 points  What month? 0 points  What time? 0 points  Count back from 20 0 points  Months in reverse 0 points  Repeat phrase 0 points  Total Score 0 points    Immunizations Immunization History  Administered Date(s) Administered   Fluad Quad(high Dose 65+) 02/11/2020, 01/05/2021, 12/14/2021   Influenza, High Dose Seasonal PF 03/18/2016, 01/06/2017, 03/09/2018   PFIZER Comirnaty(Gray Top)Covid-19 Tri-Sucrose Vaccine 02/22/2020   PFIZER(Purple Top)SARS-COV-2 Vaccination 05/06/2019, 05/27/2019   PNEUMOCOCCAL CONJUGATE-20 01/05/2021   Pneumococcal Polysaccharide-23 03/09/2018    TDAP status: Due, Education has been provided regarding the importance of this vaccine. Advised may receive this vaccine at local pharmacy or Health Dept. Aware to provide a copy of the vaccination record if obtained from local pharmacy or Health Dept. Verbalized acceptance and understanding.  Flu Vaccine status: Up to date  Pneumococcal vaccine status: Up to date  Covid-19 vaccine status: Completed vaccines  Qualifies for Shingles Vaccine? Yes   Zostavax completed No   Shingrix Completed?: No.    Education has been provided regarding the importance of this vaccine. Patient has been advised to call insurance company to determine out of pocket expense if they have not yet received this vaccine. Advised may also receive vaccine at local pharmacy or Health Dept. Verbalized acceptance and understanding.  Screening Tests Health Maintenance  Topic Date Due   DTaP/Tdap/Td (1 - Tdap) Never done   COVID-19 Vaccine (4 - 2023-24 season) 09/06/2022 (Originally 10/26/2021)   Zoster Vaccines- Shingrix (1 of 2) 11/21/2022 (Originally [birth year]/1985)   INFLUENZA VACCINE  09/26/2022   Medicare Annual Wellness (AWV)  08/21/2023   Pneumonia Vaccine 44+ Years old  Completed   DEXA SCAN  Completed    HPV VACCINES  Aged Out    Health Maintenance  Health Maintenance Due  Topic Date Due   DTaP/Tdap/Td (1 - Tdap) Never done    Colorectal cancer screening: No longer required.   Mammogram status: No longer required due to Age.  Bone Density status: Completed 08/21/16. Results reflect: Bone density results: OSTEOPOROSIS. Repeat every   years.  Lung Cancer Screening: (Low Dose CT Chest recommended if Age 61-80 years, 20 pack-year currently smoking OR have quit w/in 15years.) does not qualify.     Additional Screening:  Hepatitis C Screening: does not qualify; Completed   Vision Screening: Recommended annual ophthalmology exams for early detection of glaucoma and other disorders of the eye. Is the patient up to date with their annual eye exam?  Yes  Who is the provider or what is the name of the office in which the patient attends annual eye exams? [facility] If pt is not established with a provider, would they like to be referred to a provider to establish care? No .   Dental Screening: Recommended  annual dental exams for proper oral hygiene    Community Resource Referral / Chronic Care Management:  CRR required this visit?  No   CCM required this visit?  No     Plan:     I have personally reviewed and noted the following in the patient's chart:   Medical and social history Use of alcohol, tobacco or illicit drugs  Current medications and supplements including opioid prescriptions. Patient is not currently taking opioid prescriptions. Functional ability and status Nutritional status Physical activity Advanced directives List of other physicians Hospitalizations, surgeries, and ER visits in previous 12 months Vitals Screenings to include cognitive, depression, and falls Referrals and appointments  In addition, I have reviewed and discussed with patient certain preventive protocols, quality metrics, and best practice recommendations. A written personalized  care plan for preventive services as well as general preventive health recommendations were provided to patient.     Tillie Rung, LPN   1/61/0960   After Visit Summary: (MyChart) Due to this being a telephonic visit, the after visit summary with patients personalized plan was offered to patient via MyChart   Nurse Notes: None

## 2022-08-21 NOTE — Patient Instructions (Addendum)
Linda Glass , Thank you for taking time to come for your Medicare Wellness Visit. I appreciate your ongoing commitment to your health goals. Please review the following plan we discussed and let me know if I can assist you in the future.   These are the goals we discussed:  Goals       Blood Pressure < 140/90      Today medication was added. Continue monitoring blood pressure at home.      Patient Stated      03/26/2021, wants to get better      Stay Healthy (pt-stated)      Track and Manage My Blood Pressure-Hypertension      Timeframe:  Short-Term Goal Priority:  Medium Start Date:                             Expected End Date:                       Follow Up Date 04/16/21    - check blood pressure 3 times per week - choose a place to take my blood pressure (home, clinic or office, retail store) - write blood pressure results in a log or diary    Why is this important?   You won't feel high blood pressure, but it can still hurt your blood vessels.  High blood pressure can cause heart or kidney problems. It can also cause a stroke.  Making lifestyle changes like losing a little weight or eating less salt will help.  Checking your blood pressure at home and at different times of the day can help to control blood pressure.  If the doctor prescribes medicine remember to take it the way the doctor ordered.  Call the office if you cannot afford the medicine or if there are questions about it.     Notes:         This is a list of the screening recommended for you and due dates:  Health Maintenance  Topic Date Due   DTaP/Tdap/Td vaccine (1 - Tdap) Never done   COVID-19 Vaccine (4 - 2023-24 season) 09/06/2022*   Zoster (Shingles) Vaccine (1 of 2) 11/21/2022*   Flu Shot  09/26/2022   Medicare Annual Wellness Visit  08/21/2023   Pneumonia Vaccine  Completed   DEXA scan (bone density measurement)  Completed   HPV Vaccine  Aged Out  *Topic was postponed. The date shown is not  the original due date.    Advanced directives: Please bring a copy of your health care power of attorney and living will to the office to be added to your chart at your convenience.   Conditions/risks identified: None  Next appointment: Follow up in one year for your annual wellness visit    Preventive Care 65 Years and Older, Female Preventive care refers to lifestyle choices and visits with your health care provider that can promote health and wellness. What does preventive care include? A yearly physical exam. This is also called an annual well check. Dental exams once or twice a year. Routine eye exams. Ask your health care provider how often you should have your eyes checked. Personal lifestyle choices, including: Daily care of your teeth and gums. Regular physical activity. Eating a healthy diet. Avoiding tobacco and drug use. Limiting alcohol use. Practicing safe sex. Taking low-dose aspirin every day. Taking vitamin and mineral supplements as recommended by your health care  provider. What happens during an annual well check? The services and screenings done by your health care provider during your annual well check will depend on your age, overall health, lifestyle risk factors, and family history of disease. Counseling  Your health care provider may ask you questions about your: Alcohol use. Tobacco use. Drug use. Emotional well-being. Home and relationship well-being. Sexual activity. Eating habits. History of falls. Memory and ability to understand (cognition). Work and work Astronomer. Reproductive health. Screening  You may have the following tests or measurements: Height, weight, and BMI. Blood pressure. Lipid and cholesterol levels. These may be checked every 5 years, or more frequently if you are over 65 years old. Skin check. Lung cancer screening. You may have this screening every year starting at age 53 if you have a 30-pack-year history of smoking  and currently smoke or have quit within the past 15 years. Fecal occult blood test (FOBT) of the stool. You may have this test every year starting at age 56. Flexible sigmoidoscopy or colonoscopy. You may have a sigmoidoscopy every 5 years or a colonoscopy every 10 years starting at age 92. Hepatitis C blood test. Hepatitis B blood test. Sexually transmitted disease (STD) testing. Diabetes screening. This is done by checking your blood sugar (glucose) after you have not eaten for a while (fasting). You may have this done every 1-3 years. Bone density scan. This is done to screen for osteoporosis. You may have this done starting at age 73. Mammogram. This may be done every 1-2 years. Talk to your health care provider about how often you should have regular mammograms. Talk with your health care provider about your test results, treatment options, and if necessary, the need for more tests. Vaccines  Your health care provider may recommend certain vaccines, such as: Influenza vaccine. This is recommended every year. Tetanus, diphtheria, and acellular pertussis (Tdap, Td) vaccine. You may need a Td booster every 10 years. Zoster vaccine. You may need this after age 51. Pneumococcal 13-valent conjugate (PCV13) vaccine. One dose is recommended after age 33. Pneumococcal polysaccharide (PPSV23) vaccine. One dose is recommended after age 65. Talk to your health care provider about which screenings and vaccines you need and how often you need them. This information is not intended to replace advice given to you by your health care provider. Make sure you discuss any questions you have with your health care provider. Document Released: 03/10/2015 Document Revised: 11/01/2015 Document Reviewed: 12/13/2014 Elsevier Interactive Patient Education  2017 [employer].  Fall Prevention in the Home Falls can cause injuries. They can happen to people of all ages. There are many things you can do to make your  home safe and to help prevent falls. What can I do on the outside of my home? Regularly fix the edges of walkways and driveways and fix any cracks. Remove anything that might make you trip as you walk through a door, such as a raised step or threshold. Trim any bushes or trees on the path to your home. Use bright outdoor lighting. Clear any walking paths of anything that might make someone trip, such as rocks or tools. Regularly check to see if handrails are loose or broken. Make sure that both sides of any steps have handrails. Any raised decks and porches should have guardrails on the edges. Have any leaves, snow, or ice cleared regularly. Use sand or salt on walking paths during winter. Clean up any spills in your garage right away. This includes oil or  grease spills. What can I do in the bathroom? Use night lights. Install grab bars by the toilet and in the tub and shower. Do not use towel bars as grab bars. Use non-skid mats or decals in the tub or shower. If you need to sit down in the shower, use a plastic, non-slip stool. Keep the floor dry. Clean up any water that spills on the floor as soon as it happens. Remove soap buildup in the tub or shower regularly. Attach bath mats securely with double-sided non-slip rug tape. Do not have throw rugs and other things on the floor that can make you trip. What can I do in the bedroom? Use night lights. Make sure that you have a light by your bed that is easy to reach. Do not use any sheets or blankets that are too big for your bed. They should not hang down onto the floor. Have a firm chair that has side arms. You can use this for support while you get dressed. Do not have throw rugs and other things on the floor that can make you trip. What can I do in the kitchen? Clean up any spills right away. Avoid walking on wet floors. Keep items that you use a lot in easy-to-reach places. If you need to reach something above you, use a strong  step stool that has a grab bar. Keep electrical cords out of the way. Do not use floor polish or wax that makes floors slippery. If you must use wax, use non-skid floor wax. Do not have throw rugs and other things on the floor that can make you trip. What can I do with my stairs? Do not leave any items on the stairs. Make sure that there are handrails on both sides of the stairs and use them. Fix handrails that are broken or loose. Make sure that handrails are as long as the stairways. Check any carpeting to make sure that it is firmly attached to the stairs. Fix any carpet that is loose or worn. Avoid having throw rugs at the top or bottom of the stairs. If you do have throw rugs, attach them to the floor with carpet tape. Make sure that you have a light switch at the top of the stairs and the bottom of the stairs. If you do not have them, ask someone to add them for you. What else can I do to help prevent falls? Wear shoes that: Do not have high heels. Have rubber bottoms. Are comfortable and fit you well. Are closed at the toe. Do not wear sandals. If you use a stepladder: Make sure that it is fully opened. Do not climb a closed stepladder. Make sure that both sides of the stepladder are locked into place. Ask someone to hold it for you, if possible. Clearly mark and make sure that you can see: Any grab bars or handrails. First and last steps. Where the edge of each step is. Use tools that help you move around (mobility aids) if they are needed. These include: Canes. Walkers. Scooters. Crutches. Turn on the lights when you go into a dark area. Replace any light bulbs as soon as they burn out. Set up your furniture so you have a clear path. Avoid moving your furniture around. If any of your floors are uneven, fix them. If there are any pets around you, be aware of where they are. Review your medicines with your doctor. Some medicines can make you feel dizzy. This can increase your  chance of falling. Ask your doctor what other things that you can do to help prevent falls. This information is not intended to replace advice given to you by your health care provider. Make sure you discuss any questions you have with your health care provider. Document Released: 12/08/2008 Document Revised: 07/20/2015 Document Reviewed: 03/18/2014 Elsevier Interactive Patient Education  2017 [employer].

## 2022-09-02 ENCOUNTER — Other Ambulatory Visit (HOSPITAL_BASED_OUTPATIENT_CLINIC_OR_DEPARTMENT_OTHER): Payer: Self-pay | Admitting: Family

## 2022-09-02 DIAGNOSIS — I10 Essential (primary) hypertension: Secondary | ICD-10-CM

## 2022-09-02 NOTE — Telephone Encounter (Signed)
Rx(s) sent to pharmacy electronically.  

## 2022-09-22 ENCOUNTER — Other Ambulatory Visit: Payer: Self-pay | Admitting: Family Medicine

## 2022-09-22 DIAGNOSIS — I4891 Unspecified atrial fibrillation: Secondary | ICD-10-CM

## 2022-10-02 ENCOUNTER — Emergency Department (HOSPITAL_COMMUNITY): Payer: Medicare Other

## 2022-10-02 ENCOUNTER — Other Ambulatory Visit: Payer: Self-pay

## 2022-10-02 ENCOUNTER — Encounter (HOSPITAL_COMMUNITY): Payer: Self-pay

## 2022-10-02 ENCOUNTER — Observation Stay (HOSPITAL_COMMUNITY)
Admission: EM | Admit: 2022-10-02 | Discharge: 2022-10-04 | Disposition: A | Discharge status: Home / Self Care | Payer: Medicare Other | Attending: Internal Medicine | Admitting: Internal Medicine

## 2022-10-02 DIAGNOSIS — Z6830 Body mass index (BMI) 30.0-30.9, adult: Secondary | ICD-10-CM | POA: Diagnosis not present

## 2022-10-02 DIAGNOSIS — R197 Diarrhea, unspecified: Secondary | ICD-10-CM | POA: Insufficient documentation

## 2022-10-02 DIAGNOSIS — Z79899 Other long term (current) drug therapy: Secondary | ICD-10-CM | POA: Diagnosis not present

## 2022-10-02 DIAGNOSIS — R55 Syncope and collapse: Secondary | ICD-10-CM | POA: Insufficient documentation

## 2022-10-02 DIAGNOSIS — F419 Anxiety disorder, unspecified: Secondary | ICD-10-CM | POA: Diagnosis present

## 2022-10-02 DIAGNOSIS — I700 Atherosclerosis of aorta: Secondary | ICD-10-CM | POA: Insufficient documentation

## 2022-10-02 DIAGNOSIS — B9629 Other Escherichia coli [E. coli] as the cause of diseases classified elsewhere: Secondary | ICD-10-CM | POA: Diagnosis not present

## 2022-10-02 DIAGNOSIS — D696 Thrombocytopenia, unspecified: Secondary | ICD-10-CM | POA: Diagnosis not present

## 2022-10-02 DIAGNOSIS — R296 Repeated falls: Secondary | ICD-10-CM | POA: Diagnosis not present

## 2022-10-02 DIAGNOSIS — F411 Generalized anxiety disorder: Secondary | ICD-10-CM | POA: Diagnosis present

## 2022-10-02 DIAGNOSIS — W19XXXA Unspecified fall, initial encounter: Secondary | ICD-10-CM | POA: Diagnosis not present

## 2022-10-02 DIAGNOSIS — M79642 Pain in left hand: Secondary | ICD-10-CM | POA: Diagnosis present

## 2022-10-02 DIAGNOSIS — B9620 Unspecified Escherichia coli [E. coli] as the cause of diseases classified elsewhere: Secondary | ICD-10-CM | POA: Insufficient documentation

## 2022-10-02 DIAGNOSIS — E669 Obesity, unspecified: Secondary | ICD-10-CM | POA: Diagnosis not present

## 2022-10-02 DIAGNOSIS — E782 Mixed hyperlipidemia: Secondary | ICD-10-CM | POA: Diagnosis present

## 2022-10-02 DIAGNOSIS — N3000 Acute cystitis without hematuria: Secondary | ICD-10-CM

## 2022-10-02 DIAGNOSIS — N390 Urinary tract infection, site not specified: Secondary | ICD-10-CM | POA: Diagnosis not present

## 2022-10-02 DIAGNOSIS — I4819 Other persistent atrial fibrillation: Secondary | ICD-10-CM | POA: Diagnosis not present

## 2022-10-02 DIAGNOSIS — D7589 Other specified diseases of blood and blood-forming organs: Secondary | ICD-10-CM | POA: Diagnosis not present

## 2022-10-02 DIAGNOSIS — R600 Localized edema: Secondary | ICD-10-CM

## 2022-10-02 DIAGNOSIS — Z8616 Personal history of COVID-19: Secondary | ICD-10-CM | POA: Insufficient documentation

## 2022-10-02 DIAGNOSIS — R531 Weakness: Secondary | ICD-10-CM

## 2022-10-02 DIAGNOSIS — G4700 Insomnia, unspecified: Secondary | ICD-10-CM | POA: Diagnosis not present

## 2022-10-02 DIAGNOSIS — S6000XA Contusion of unspecified finger without damage to nail, initial encounter: Secondary | ICD-10-CM

## 2022-10-02 DIAGNOSIS — Z7901 Long term (current) use of anticoagulants: Secondary | ICD-10-CM | POA: Insufficient documentation

## 2022-10-02 DIAGNOSIS — E785 Hyperlipidemia, unspecified: Secondary | ICD-10-CM | POA: Diagnosis present

## 2022-10-02 DIAGNOSIS — I10 Essential (primary) hypertension: Secondary | ICD-10-CM | POA: Diagnosis not present

## 2022-10-02 LAB — CBC WITH DIFFERENTIAL/PLATELET
Abs Immature Granulocytes: 0.04 10*3/uL (ref 0.00–0.07)
Basophils Absolute: 0 10*3/uL (ref 0.0–0.1)
Basophils Relative: 0 %
Eosinophils Absolute: 0 10*3/uL (ref 0.0–0.5)
Eosinophils Relative: 0 %
HCT: 41.5 % (ref 36.0–46.0)
Hemoglobin: 13.6 g/dL (ref 12.0–15.0)
Immature Granulocytes: 0 %
Lymphocytes Relative: 7 %
Lymphs Abs: 0.8 10*3/uL (ref 0.7–4.0)
MCH: 34.6 pg — ABNORMAL HIGH (ref 26.0–34.0)
MCHC: 32.8 g/dL (ref 30.0–36.0)
MCV: 105.6 fL — ABNORMAL HIGH (ref 80.0–100.0)
Monocytes Absolute: 0.6 10*3/uL (ref 0.1–1.0)
Monocytes Relative: 6 %
Neutro Abs: 9.3 10*3/uL — ABNORMAL HIGH (ref 1.7–7.7)
Neutrophils Relative %: 87 %
Platelets: 145 10*3/uL — ABNORMAL LOW (ref 150–400)
RBC: 3.93 MIL/uL (ref 3.87–5.11)
RDW: 13.1 % (ref 11.5–15.5)
WBC: 10.8 10*3/uL — ABNORMAL HIGH (ref 4.0–10.5)
nRBC: 0 % (ref 0.0–0.2)

## 2022-10-02 LAB — URINALYSIS, W/ REFLEX TO CULTURE (INFECTION SUSPECTED)
Bilirubin Urine: NEGATIVE
Glucose, UA: NEGATIVE mg/dL
Ketones, ur: NEGATIVE mg/dL
Nitrite: POSITIVE — AB
Protein, ur: 30 mg/dL — AB
Specific Gravity, Urine: 1.016 (ref 1.005–1.030)
WBC, UA: >50 WBC/hpf (ref 0–5)
pH: 5 (ref 5.0–8.0)

## 2022-10-02 LAB — COMPREHENSIVE METABOLIC PANEL
ALT: 22 U/L (ref 0–44)
AST: 34 U/L (ref 15–41)
Albumin: 4.1 g/dL (ref 3.5–5.0)
Alkaline Phosphatase: 118 U/L (ref 38–126)
Anion gap: 12 (ref 5–15)
BUN: 16 mg/dL (ref 8–23)
CO2: 21 mmol/L — ABNORMAL LOW (ref 22–32)
Calcium: 9.5 mg/dL (ref 8.9–10.3)
Chloride: 105 mmol/L (ref 98–111)
Creatinine, Ser: 0.46 mg/dL (ref 0.44–1.00)
GFR, Estimated: >60 mL/min (ref >60)
Glucose, Bld: 97 mg/dL (ref 70–99)
Potassium: 3.9 mmol/L (ref 3.5–5.1)
Sodium: 138 mmol/L (ref 135–145)
Total Bilirubin: 1.1 mg/dL (ref 0.3–1.2)
Total Protein: 7.5 g/dL (ref 6.5–8.1)

## 2022-10-02 LAB — SARS CORONAVIRUS 2 BY RT PCR: SARS Coronavirus 2 by RT PCR: NEGATIVE

## 2022-10-02 LAB — CK: Total CK: 311 U/L — ABNORMAL HIGH (ref 38–234)

## 2022-10-02 LAB — TROPONIN I (HIGH SENSITIVITY): Troponin I (High Sensitivity): 7 ng/L (ref <18)

## 2022-10-02 MED ORDER — CITALOPRAM HYDROBROMIDE 20 MG PO TABS
20.0000 mg | ORAL_TABLET | Freq: Every day | ORAL | Status: DC
  Administered 2022-10-02 – 2022-10-04 (×3): 20 mg via ORAL
  Filled 2022-10-02 (×3): qty 1

## 2022-10-02 MED ORDER — SODIUM CHLORIDE 0.9% FLUSH
3.0000 mL | Freq: Two times a day (BID) | INTRAVENOUS | Status: DC
  Administered 2022-10-02 – 2022-10-04 (×4): 3 mL via INTRAVENOUS

## 2022-10-02 MED ORDER — PANTOPRAZOLE SODIUM 40 MG PO TBEC
40.0000 mg | DELAYED_RELEASE_TABLET | Freq: Every day | ORAL | Status: DC
  Administered 2022-10-02 – 2022-10-04 (×3): 40 mg via ORAL
  Filled 2022-10-02 (×3): qty 1

## 2022-10-02 MED ORDER — METOPROLOL SUCCINATE ER 25 MG PO TB24
25.0000 mg | ORAL_TABLET | Freq: Once | ORAL | Status: AC
  Administered 2022-10-02: 25 mg via ORAL
  Filled 2022-10-02: qty 1

## 2022-10-02 MED ORDER — MAGNESIUM OXIDE -MG SUPPLEMENT 400 (240 MG) MG PO TABS
400.0000 mg | ORAL_TABLET | Freq: Every day | ORAL | Status: DC
  Administered 2022-10-02 – 2022-10-04 (×3): 400 mg via ORAL
  Filled 2022-10-02 (×3): qty 1

## 2022-10-02 MED ORDER — CITALOPRAM HYDROBROMIDE 10 MG PO TABS: 20.0000 mg | ORAL_TABLET | Freq: Every day | ORAL | Status: DC

## 2022-10-02 MED ORDER — ONDANSETRON HCL 4 MG PO TABS: 4.0000 mg | ORAL_TABLET | Freq: Four times a day (QID) | ORAL | Status: DC | PRN

## 2022-10-02 MED ORDER — ATORVASTATIN CALCIUM 40 MG PO TABS
40.0000 mg | ORAL_TABLET | Freq: Every day | ORAL | Status: DC
  Administered 2022-10-03 – 2022-10-04 (×2): 40 mg via ORAL
  Filled 2022-10-02 (×2): qty 1

## 2022-10-02 MED ORDER — FERROUS GLUCONATE 324 (38 FE) MG PO TABS
324.0000 mg | ORAL_TABLET | ORAL | Status: DC
  Administered 2022-10-03: 324 mg via ORAL
  Filled 2022-10-02: qty 1

## 2022-10-02 MED ORDER — APIXABAN 5 MG PO TABS
5.0000 mg | ORAL_TABLET | Freq: Two times a day (BID) | ORAL | Status: DC
  Administered 2022-10-02 – 2022-10-04 (×4): 5 mg via ORAL
  Filled 2022-10-02 (×4): qty 1

## 2022-10-02 MED ORDER — AMLODIPINE BESYLATE 5 MG PO TABS
5.0000 mg | ORAL_TABLET | Freq: Once | ORAL | Status: AC
  Administered 2022-10-02: 5 mg via ORAL
  Filled 2022-10-02: qty 1

## 2022-10-02 MED ORDER — AMLODIPINE BESYLATE 5 MG PO TABS
5.0000 mg | ORAL_TABLET | Freq: Every day | ORAL | Status: DC
  Administered 2022-10-02 – 2022-10-03 (×2): 5 mg via ORAL
  Filled 2022-10-02 (×2): qty 1

## 2022-10-02 MED ORDER — ACETAMINOPHEN 325 MG PO TABS
650.0000 mg | ORAL_TABLET | Freq: Four times a day (QID) | ORAL | Status: DC | PRN
  Administered 2022-10-03 – 2022-10-04 (×3): 650 mg via ORAL
  Filled 2022-10-02 (×4): qty 2

## 2022-10-02 MED ORDER — SPIRONOLACTONE 25 MG PO TABS
25.0000 mg | ORAL_TABLET | Freq: Every day | ORAL | Status: DC
  Administered 2022-10-02 – 2022-10-04 (×3): 25 mg via ORAL
  Filled 2022-10-02 (×3): qty 1

## 2022-10-02 MED ORDER — LORAZEPAM 0.5 MG PO TABS
0.2500 mg | ORAL_TABLET | Freq: Every evening | ORAL | Status: DC | PRN
  Administered 2022-10-02 – 2022-10-04 (×2): 0.25 mg via ORAL
  Filled 2022-10-02 (×2): qty 1

## 2022-10-02 MED ORDER — FERROUS GLUCONATE 324 (37.5 FE) MG PO TABS: 324.0000 mg | ORAL_TABLET | ORAL | Status: DC

## 2022-10-02 MED ORDER — ENOXAPARIN SODIUM 40 MG/0.4ML IJ SOSY: 40.0000 mg | PREFILLED_SYRINGE | INTRAMUSCULAR | Status: DC

## 2022-10-02 MED ORDER — FUROSEMIDE 40 MG PO TABS
80.0000 mg | ORAL_TABLET | Freq: Every day | ORAL | Status: DC
  Administered 2022-10-03: 80 mg via ORAL
  Filled 2022-10-02: qty 2

## 2022-10-02 MED ORDER — SODIUM CHLORIDE 0.9 % IV SOLN
1.0000 g | Freq: Once | INTRAVENOUS | Status: AC
  Administered 2022-10-02: 1 g via INTRAVENOUS
  Filled 2022-10-02: qty 10

## 2022-10-02 MED ORDER — LORAZEPAM 0.5 MG PO TABS
0.5000 mg | ORAL_TABLET | Freq: Once | ORAL | Status: AC
  Administered 2022-10-02: 0.5 mg via ORAL
  Filled 2022-10-02: qty 1

## 2022-10-02 MED ORDER — ACETAMINOPHEN 650 MG RE SUPP: 650.0000 mg | Freq: Four times a day (QID) | RECTAL | Status: DC | PRN

## 2022-10-02 MED ORDER — METOPROLOL SUCCINATE ER 25 MG PO TB24
25.0000 mg | ORAL_TABLET | Freq: Every day | ORAL | Status: DC
  Administered 2022-10-03 – 2022-10-04 (×2): 25 mg via ORAL
  Filled 2022-10-02 (×2): qty 1

## 2022-10-02 MED ORDER — ACETAMINOPHEN 325 MG PO TABS
650.0000 mg | ORAL_TABLET | Freq: Once | ORAL | Status: AC
  Administered 2022-10-02: 650 mg via ORAL
  Filled 2022-10-02: qty 2

## 2022-10-02 MED ORDER — ONDANSETRON HCL 4 MG/2ML IJ SOLN: 4.0000 mg | Freq: Four times a day (QID) | INTRAMUSCULAR | Status: DC | PRN

## 2022-10-02 MED ORDER — MAGNESIUM OXIDE 400 (241.3 MG) MG PO TABS: 400.0000 mg | ORAL_TABLET | Freq: Every day | ORAL | Status: DC

## 2022-10-02 MED ORDER — SODIUM CHLORIDE 0.9 % IV SOLN
1.0000 g | Freq: Every day | INTRAVENOUS | Status: DC
  Administered 2022-10-03: 1 g via INTRAVENOUS
  Filled 2022-10-02: qty 10

## 2022-10-02 MED ORDER — LACTATED RINGERS IV BOLUS
1000.0000 mL | Freq: Once | INTRAVENOUS | Status: AC
  Administered 2022-10-02: 1000 mL via INTRAVENOUS

## 2022-10-02 MED ORDER — SODIUM CHLORIDE 0.9 % IV SOLN: 1.0000 g | Freq: Every day | INTRAVENOUS | Status: DC

## 2022-10-02 NOTE — ED Notes (Signed)
ED TO INPATIENT HANDOFF REPORT  Name/Age/Gender Linda Glass 89 y.o. female  Code Status Code Status History     Date Active Date Inactive Code Status Order ID Comments User Context   09/17/2021 1828 09/21/2021 1715 Full Code [id number]  Erick Blinks, MD ED   03/12/2021 1111 03/16/2021 1642 Full Code [id number]  [name], Hyman Hopes, MD Inpatient   05/01/2020 1930 05/12/2020 1909 Full Code [id number]  Bobette Mo, MD ED       Home/SNF/Other Home  Chief Complaint Fall [W19.XXXA]  Level of Care/Admitting Diagnosis ED Disposition     ED Disposition  Admit   Condition  --   Comment  Hospital Area: [facility] [[id number]]  Level of Care: Telemetry [5]  Admit to tele based on following criteria: Other see comments  Comments: Afib Hx.  May admit patient to [facility] or [facility] if equivalent level of care is available:: No  Covid Evaluation: Asymptomatic - no recent exposure (last 10 days) testing not required  Diagnosis: Fall [[id number]]  Admitting Physician: Bobette Mo [[id number]]  Attending Physician: Bobette Mo [[id number]]  Certification:: I certify this patient will need inpatient services for at least 2 midnights  Estimated Length of Stay: 2          Medical History Past Medical History:  Diagnosis Date   Anemia 04/2020   Anxiety    Arthritis    Chicken pox    COVID-19    Heart murmur    Hyperlipidemia    Hypertension     Allergies Allergies  Allergen Reactions   Losartan Other (See Comments)    diarrhea    IV Location/Drains/Wounds Patient Lines/Drains/Airways Status     Active Line/Drains/Airways     Name Placement date Placement time Site Days   Peripheral IV 10/02/22 22 G Right;Lateral Hand 10/02/22  1313  Hand  less than 1   External Urinary Catheter 09/18/21  1957  --  379   Incision (Closed) 03/12/21 Abdomen Other (Comment) 03/12/21  0922  -- 569   Incision - 2 Ports Abdomen Right  Lower;Mid 03/12/21  0805  -- 569            Labs/Imaging Results for orders placed or performed during the hospital encounter of 10/02/22 (from the past 48 hour(s))  SARS Coronavirus 2 by RT PCR (hospital order, performed in [facility] hospital lab) *cepheid single result test* Anterior Nasal Swab     Status: None   Collection Time: 10/02/22  9:45 AM   Specimen: Anterior Nasal Swab  Result Value Ref Range   SARS Coronavirus 2 by RT PCR NEGATIVE NEGATIVE    Comment: (NOTE) SARS-CoV-2 target nucleic acids are NOT DETECTED.  The SARS-CoV-2 RNA is generally detectable in upper and lower respiratory specimens during the acute phase of infection. The lowest concentration of SARS-CoV-2 viral copies this assay can detect is 250 copies / mL. A negative result does not preclude SARS-CoV-2 infection and should not be used as the sole basis for treatment or other patient management decisions.  A negative result may occur with improper specimen collection / handling, submission of specimen other than nasopharyngeal swab, presence of viral mutation(s) within the areas targeted by this assay, and inadequate number of viral copies (<250 copies / mL). A negative result must be combined with clinical observations, patient history, and epidemiological information.  Fact Sheet for Patients:   RoadLapTop.co.za  Fact Sheet for Healthcare Providers: http://kim-miller.com/  This test is not yet  approved or  cleared by the [place] and has been authorized for detection and/or diagnosis of SARS-CoV-2 by FDA under an Emergency Use Authorization (EUA).  This EUA will remain in effect (meaning this test can be used) for the duration of the COVID-19 declaration under Section 564(b)(1) of the Act, 21 U.S.C. section 360bbb-3(b)(1), unless the authorization is terminated or revoked sooner.  Performed at [facility], [address] [place], [place] [address]   CBC with Differential/Platelet     Status: Abnormal   Collection Time: 10/02/22 11:25 AM  Result Value Ref Range   WBC 10.8 (H) 4.0 - 10.5 K/uL   RBC 3.93 3.87 - 5.11 MIL/uL   Hemoglobin 13.6 12.0 - 15.0 g/dL   HCT 60.4 54.0 - 98.1 %   MCV 105.6 (H) 80.0 - 100.0 fL   MCH 34.6 (H) 26.0 - 34.0 pg   MCHC 32.8 30.0 - 36.0 g/dL   RDW 19.1 47.8 - 29.5 %   Platelets 145 (L) 150 - 400 K/uL   nRBC 0.0 0.0 - 0.2 %   Neutrophils Relative % 87 %   Neutro Abs 9.3 (H) 1.7 - 7.7 K/uL   Lymphocytes Relative 7 %   Lymphs Abs 0.8 0.7 - 4.0 K/uL   Monocytes Relative 6 %   Monocytes Absolute 0.6 0.1 - 1.0 K/uL   Eosinophils Relative 0 %   Eosinophils Absolute 0.0 0.0 - 0.5 K/uL   Basophils Relative 0 %   Basophils Absolute 0.0 0.0 - 0.1 K/uL   Immature Granulocytes 0 %   Abs Immature Granulocytes 0.04 0.00 - 0.07 K/uL    Comment: Performed at [facility], [address], [place], [place] [address]  Comprehensive metabolic panel     Status: Abnormal   Collection Time: 10/02/22 11:25 AM  Result Value Ref Range   Sodium 138 135 - 145 mmol/L   Potassium 3.9 3.5 - 5.1 mmol/L   Chloride 105 98 - 111 mmol/L   CO2 21 (L) 22 - 32 mmol/L   Glucose, Bld 97 70 - 99 mg/dL    Comment: Glucose reference range applies only to samples taken after fasting for at least 8 hours.   BUN 16 8 - 23 mg/dL   Creatinine, Ser 8.65 0.44 - 1.00 mg/dL   Calcium 9.5 8.9 - 78.4 mg/dL   Total Protein 7.5 6.5 - 8.1 g/dL   Albumin 4.1 3.5 - 5.0 g/dL   AST 34 15 - 41 U/L   ALT 22 0 - 44 U/L   Alkaline Phosphatase 118 38 - 126 U/L   Total Bilirubin 1.1 0.3 - 1.2 mg/dL   GFR, Estimated >69 >62 mL/min    Comment: (NOTE) Calculated using the CKD-EPI Creatinine Equation (2021)    Anion gap 12 5 - 15    Comment: Performed at [facility], [address], [place], [place] [address]  Troponin I (High Sensitivity)     Status: None   Collection Time: 10/02/22  11:25 AM  Result Value Ref Range   Troponin I (High Sensitivity) 7 <18 ng/L    Comment: (NOTE) Elevated high sensitivity troponin I (hsTnI) values and significant  changes across serial measurements may suggest ACS but many other  chronic and acute conditions are known to elevate hsTnI results.  Refer to the "Links" section for chest pain algorithms and additional  guidance. Performed at [facility], [address] [place], [place] [address]   CK  Status: Abnormal   Collection Time: 10/02/22 11:25 AM  Result Value Ref Range   Total CK 311 (H) 38 - 234 U/L    Comment: Performed at [facility], [address], [place], [place] [address]  Urinalysis, w/ Reflex to Culture (Infection Suspected) -Urine, Clean Catch     Status: Abnormal   Collection Time: 10/02/22 12:04 PM  Result Value Ref Range   Specimen Source URINE, CLEAN CATCH    Color, Urine YELLOW YELLOW   APPearance HAZY (A) CLEAR   Specific Gravity, Urine 1.016 1.005 - 1.030   pH 5.0 5.0 - 8.0   Glucose, UA NEGATIVE NEGATIVE mg/dL   Hgb urine dipstick SMALL (A) NEGATIVE   Bilirubin Urine NEGATIVE NEGATIVE   Ketones, ur NEGATIVE NEGATIVE mg/dL   Protein, ur 30 (A) NEGATIVE mg/dL   Nitrite POSITIVE (A) NEGATIVE   Leukocytes,Ua LARGE (A) NEGATIVE   RBC / HPF 0-5 0 - 5 RBC/hpf   WBC, UA >50 0 - 5 WBC/hpf    Comment:        Reflex urine culture not performed if WBC <=10, OR if Squamous epithelial cells >5. If Squamous epithelial cells >5 suggest recollection.    Bacteria, UA FEW (A) NONE SEEN   Squamous Epithelial / HPF 0-5 0 - 5 /HPF   WBC Clumps PRESENT    Mucus PRESENT     Comment: Performed at [facility], [address], [place], [place] [address]   CT Head Wo Contrast  Result Date: 10/02/2022 CLINICAL DATA:  Head trauma, minor (Age >= 65y). EXAM: CT HEAD WITHOUT CONTRAST TECHNIQUE: Contiguous axial images were obtained from the base of the skull through  the vertex without intravenous contrast. RADIATION DOSE REDUCTION: This exam was performed according to the departmental dose-optimization program which includes automated exposure control, adjustment of the mA and/or kV according to patient size and/or use of iterative reconstruction technique. COMPARISON:  None Available. FINDINGS: Brain: No acute hemorrhage. Mild patchy hypoattenuation of the cerebral white matter, most consistent with mild chronic small-vessel disease. Age-appropriate volume loss. No hydrocephalus or extra-axial collection. No mass effect or midline shift. Vascular: No hyperdense vessel or unexpected calcification. Skull: No calvarial fracture or suspicious bone lesion. Skull base is unremarkable. Sinuses/Orbits: No acute finding. Other: None. IMPRESSION: 1. No evidence of acute intracranial injury. 2. Mild chronic small-vessel disease and volume loss. Electronically Signed   By: Orvan Falconer M.D.   On: 10/02/2022 11:05   DG Hand Complete Left  Result Date: 10/02/2022 CLINICAL DATA:  Possible syncope with fall resulting in left ring finger pain and bruising EXAM: LEFT HAND - COMPLETE 3 VIEW COMPARISON:  Left hand radiograph dated 05/10/2020 FINDINGS: There is no evidence of fracture or dislocation. Similar degenerative changes of the hand. Soft tissues are unremarkable. IMPRESSION: 1. No acute displaced fracture or dislocation. 2. Similar degenerative changes of the hand. Electronically Signed   By: Agustin Cree M.D.   On: 10/02/2022 10:27   DG Chest 1 View  Result Date: 10/02/2022 CLINICAL DATA:  Pain EXAM: CHEST  1 VIEW COMPARISON:  X-ray 05/02/2020 FINDINGS: Mildly enlarged cardiopericardial silhouette. Slight elevation left hemidiaphragm. No consolidation, pneumothorax or effusion. No edema. Calcified aorta. Known hiatal hernia. Sclerotic area along the proximal right humerus. This is of uncertain etiology but could be a chondroid lesion. Please correlate for any prior imaging or  history otherwise additional shoulder x-ray can be performed when clinically appropriate IMPRESSION: Enlarged heart.  Hiatal hernia.  No consolidation  or edema. Sclerotic area along the proximal right humerus. A chondroid lesion is possible. However there is differential. Please correlate with any prior or history otherwise follow up shoulder x-rays can be performed when clinically appropriate Electronically Signed   By: Karen Kays M.D.   On: 10/02/2022 10:27    Pending Labs Unresulted Labs (From admission, onward)     Start     Ordered   10/02/22 1204  Urine Culture  Once,   R        10/02/22 1204            Vitals/Pain Today's Vitals   10/02/22 0917 10/02/22 0920 10/02/22 1246 10/02/22 1341  BP: (!) 176/89     Pulse: 74     Resp: 18     Temp: 97.9 F (36.6 C)   97.8 F (36.6 C)  TempSrc: Oral     SpO2: 100%     Weight:  65 kg    Height:  5\' 1"  (1.549 m)    PainSc:  5  2      Isolation Precautions No active isolations  Medications Medications  cefTRIAXone (ROCEPHIN) 1 g in sodium chloride 0.9 % 100 mL IVPB (has no administration in time range)  acetaminophen (TYLENOL) tablet 650 mg (650 mg Oral Given 10/02/22 1134)  metoprolol succinate (TOPROL-XL) 24 hr tablet 25 mg (25 mg Oral Given 10/02/22 1134)  amLODipine (NORVASC) tablet 5 mg (5 mg Oral Given 10/02/22 1134)  LORazepam (ATIVAN) tablet 0.5 mg (0.5 mg Oral Given 10/02/22 1134)  lactated ringers bolus 1,000 mL (0 mLs Intravenous Stopped 10/02/22 1602)  cefTRIAXone (ROCEPHIN) 1 g in sodium chloride 0.9 % 100 mL IVPB (0 g Intravenous Stopped 10/02/22 1345)    Mobility walks with device

## 2022-10-02 NOTE — ED Notes (Signed)
Tech assisted pt to bathroom via wheelchair

## 2022-10-02 NOTE — ED Triage Notes (Addendum)
Patient BIB [facility] from home. Complaining of left finger pain after a fall last night. Swelling to the finger. Did not hit her head. She does not remember how she fell. Takes eloquis.

## 2022-10-02 NOTE — ED Provider Notes (Signed)
[name] Provider Note   CSN: [id number] Arrival date & time: 10/02/22  9528     History  Chief Complaint  Patient presents with   Linda Glass is a 89 y.o. female.  Patient is an 89 year old female with a history of hyperlipidemia, hypertension, anemia who lives at home independently and is coming in today after a fall last night.  She reports getting up to go to the bathroom and not feeling well and ending up on the floor.  She is not sure what caused her to fall.  She was unable to get up off of the floor and her daughter attempted to call her today and could not get a hold of anyone so her neighbor came over and found her on the floor in the hall.  Patient reports that she does not think she passed out but she is not completely sure.  She also does not think she hit her head.  Her only complaint currently is pain in her left hand and swelling.  However she also reports she just does not feel completely herself.  She feels tired and a little bit achy all over but denies headache, visual changes, speech issues, unilateral weakness or numbness.  No pain in her legs shoulders chest abdomen or pelvic area.  No shortness of breath.  No recent medication changes and patient has not had any of her morning medications.  The history is provided by the patient.  Fall       Home Medications Prior to Admission medications   Medication Sig Start Date End Date Taking? Authorizing Provider  acetaminophen (TYLENOL) 325 MG tablet Take 2 tablets (650 mg total) by mouth every 6 (six) hours as needed for mild pain (or Fever >/= 101). 09/21/21  Yes Sheikh, Omair Latif, DO  amLODipine (NORVASC) 5 MG tablet TAKE ONE TABLET BY MOUTH DAILY 09/02/22  Yes Alver Sorrow, NP  atorvastatin (LIPITOR) 40 MG tablet TAKE 1 TABLET EACH DAY. Patient taking differently: Take 40 mg by mouth daily. 08/16/22  Yes [name], [name], MD  citalopram  (CELEXA) 20 MG tablet Take 1 tablet (20 mg total) by mouth daily. 07/23/22  Yes [name], [name], MD  Cyanocobalamin (VITAMIN B 12 PO) Take 500 mcg by mouth daily.   Yes [provider]  ELIQUIS 5 MG TABS tablet TAKE ONE TABLET BY MOUTH TWICE DAILY Patient taking differently: Take 5 mg by mouth 2 (two) times daily. 09/23/22  Yes [name], [name], MD  Ferrous Gluconate 324 (37.5 Fe) MG TABS Take 324 mg by mouth every other day.   Yes [provider]  fluticasone (FLONASE) 50 MCG/ACT nasal spray Place 2 sprays into both nostrils daily. 10/25/21  Yes Karie Georges, MD  furosemide (LASIX) 80 MG tablet Take 1 tablet (80 mg total) by mouth daily. 01/21/22  Yes Chilton Si, MD  LORazepam (ATIVAN) 0.5 MG tablet Take 0.5-1 tablets (0.25-0.5 mg total) by mouth at bedtime as needed for anxiety. TAKE 1/2 TABLET AT BEDTIME AS NEEDED FOR ANXIETY Patient taking differently: Take 0.25-0.5 mg by mouth at bedtime as needed for anxiety. 07/23/22  Yes [name], [name], MD  magnesium oxide (MAG-OX) 400 (241.3 Mg) MG tablet Take 0.5 tablets (200 mg total) by mouth daily. Patient taking differently: Take 400 mg by mouth daily. 05/12/20  Yes [place], Eric J, DO  metoprolol succinate (TOPROL-XL) 25 MG 24 hr tablet Take 1 tablet (25 mg  total) by mouth daily. 04/08/22  Yes [name], [name], MD  Multiple Vitamin (MULTIVITAMIN WITH MINERALS) TABS tablet Take 1 tablet by mouth daily.   Yes [provider]  pantoprazole (PROTONIX) 40 MG tablet Take 1 tablet (40 mg total) by mouth daily. 03/06/22  Yes [name], [name], MD  spironolactone (ALDACTONE) 25 MG tablet Take 1 tablet (25 mg total) by mouth daily. 10/08/21 10/03/22 Yes Alver Sorrow, NP  ondansetron (ZOFRAN) 4 MG tablet Take 1 tablet (4 mg total) by mouth every 8 (eight) hours as needed for nausea or vomiting. 10/25/21   Karie Georges, MD      Allergies    Losartan    Review of Systems   Review of Systems  Physical Exam Updated Vital  Signs BP (!) 176/89 (BP Location: Right Arm)   Pulse 74   Temp 97.9 F (36.6 C) (Oral)   Resp 18   Ht 5\' 1"  (1.549 m)   Wt 65 kg   SpO2 100%   BMI 27.08 kg/m  Physical Exam Vitals and nursing note reviewed.  Constitutional:      General: She is not in acute distress.    Appearance: She is well-developed.  HENT:     Head: Normocephalic and atraumatic.     Right Ear: Tympanic membrane normal.     Left Ear: Tympanic membrane normal.  Eyes:     Conjunctiva/sclera: Conjunctivae normal.     Pupils: Pupils are equal, round, and reactive to light.  Cardiovascular:     Rate and Rhythm: Normal rate and regular rhythm.     Heart sounds: No murmur heard. Pulmonary:     Effort: Pulmonary effort is normal. No respiratory distress.     Breath sounds: Normal breath sounds. No wheezing or rales.  Abdominal:     General: There is no distension.     Palpations: Abdomen is soft.     Tenderness: There is no abdominal tenderness. There is no guarding or rebound.  Musculoskeletal:        General: Tenderness and signs of injury present. Normal range of motion.     Right shoulder: Normal.     Left shoulder: Normal.     Right elbow: Normal.     Left elbow: Normal.     Right wrist: Normal.     Left wrist: Normal.       Hands:     Cervical back: Normal range of motion and neck supple. No tenderness.     Right hip: Normal.     Left hip: Normal.     Right knee: Normal.     Left knee: Normal.  Skin:    General: Skin is warm and dry.     Findings: No erythema or rash.  Neurological:     Mental Status: She is alert and oriented to person, place, and time. Mental status is at baseline.     Sensory: No sensory deficit.     Motor: No weakness.     Gait: Gait normal.  Psychiatric:        Behavior: Behavior normal.     ED Results / Procedures / Treatments   Labs (all labs ordered are listed, but only abnormal results are displayed) Labs Reviewed  CBC WITH DIFFERENTIAL/PLATELET - Abnormal;  Notable for the following components:      Result Value   WBC 10.8 (*)    MCV 105.6 (*)    MCH 34.6 (*)    Platelets 145 (*)  Neutro Abs 9.3 (*)    All other components within normal limits  COMPREHENSIVE METABOLIC PANEL - Abnormal; Notable for the following components:   CO2 21 (*)    All other components within normal limits  URINALYSIS, W/ REFLEX TO CULTURE (INFECTION SUSPECTED) - Abnormal; Notable for the following components:   APPearance HAZY (*)    Hgb urine dipstick SMALL (*)    Protein, ur 30 (*)    Nitrite POSITIVE (*)    Leukocytes,Ua LARGE (*)    Bacteria, UA FEW (*)    All other components within normal limits  SARS CORONAVIRUS 2 BY RT PCR  URINE CULTURE  TROPONIN I (HIGH SENSITIVITY)    EKG EKG Interpretation Date/Time:  Wednesday October 02 2022 10:24:17 EDT Ventricular Rate:  68 PR Interval:    QRS Duration:  104 QT Interval:  418 QTC Calculation: 444 R Axis:   11  Text Interpretation: Atrial fibrillation Incomplete right bundle branch block Septal infarct , age undetermined No significant change since last tracing When compared with ECG of 17-Sep-2021 13:36, PREVIOUS ECG IS PRESENT Confirmed by Gwyneth Sprout ([id number]) on 10/02/2022 10:41:49 AM  Radiology CT Head Wo Contrast  Result Date: 10/02/2022 CLINICAL DATA:  Head trauma, minor (Age >= 65y). EXAM: CT HEAD WITHOUT CONTRAST TECHNIQUE: Contiguous axial images were obtained from the base of the skull through the vertex without intravenous contrast. RADIATION DOSE REDUCTION: This exam was performed according to the departmental dose-optimization program which includes automated exposure control, adjustment of the mA and/or kV according to patient size and/or use of iterative reconstruction technique. COMPARISON:  None Available. FINDINGS: Brain: No acute hemorrhage. Mild patchy hypoattenuation of the cerebral white matter, most consistent with mild chronic small-vessel disease. Age-appropriate volume loss. No  hydrocephalus or extra-axial collection. No mass effect or midline shift. Vascular: No hyperdense vessel or unexpected calcification. Skull: No calvarial fracture or suspicious bone lesion. Skull base is unremarkable. Sinuses/Orbits: No acute finding. Other: None. IMPRESSION: 1. No evidence of acute intracranial injury. 2. Mild chronic small-vessel disease and volume loss. Electronically Signed   By: Orvan Falconer M.D.   On: 10/02/2022 11:05   DG Hand Complete Left  Result Date: 10/02/2022 CLINICAL DATA:  Possible syncope with fall resulting in left ring finger pain and bruising EXAM: LEFT HAND - COMPLETE 3 VIEW COMPARISON:  Left hand radiograph dated 05/10/2020 FINDINGS: There is no evidence of fracture or dislocation. Similar degenerative changes of the hand. Soft tissues are unremarkable. IMPRESSION: 1. No acute displaced fracture or dislocation. 2. Similar degenerative changes of the hand. Electronically Signed   By: Agustin Cree M.D.   On: 10/02/2022 10:27   DG Chest 1 View  Result Date: 10/02/2022 CLINICAL DATA:  Pain EXAM: CHEST  1 VIEW COMPARISON:  X-ray 05/02/2020 FINDINGS: Mildly enlarged cardiopericardial silhouette. Slight elevation left hemidiaphragm. No consolidation, pneumothorax or effusion. No edema. Calcified aorta. Known hiatal hernia. Sclerotic area along the proximal right humerus. This is of uncertain etiology but could be a chondroid lesion. Please correlate for any prior imaging or history otherwise additional shoulder x-ray can be performed when clinically appropriate IMPRESSION: Enlarged heart.  Hiatal hernia.  No consolidation or edema. Sclerotic area along the proximal right humerus. A chondroid lesion is possible. However there is differential. Please correlate with any prior or history otherwise follow up shoulder x-rays can be performed when clinically appropriate Electronically Signed   By: Karen Kays M.D.   On: 10/02/2022 10:27    Procedures Procedures  Medications  Ordered in ED Medications  lactated ringers bolus 1,000 mL (has no administration in time range)  cefTRIAXone (ROCEPHIN) 1 g in sodium chloride 0.9 % 100 mL IVPB (has no administration in time range)  acetaminophen (TYLENOL) tablet 650 mg (650 mg Oral Given 10/02/22 1134)  metoprolol succinate (TOPROL-XL) 24 hr tablet 25 mg (25 mg Oral Given 10/02/22 1134)  amLODipine (NORVASC) tablet 5 mg (5 mg Oral Given 10/02/22 1134)  LORazepam (ATIVAN) tablet 0.5 mg (0.5 mg Oral Given 10/02/22 1134)    ED Course/ Medical Decision Making/ A&P                                 Medical Decision Making Amount and/or Complexity of Data Reviewed Labs: ordered. Decision-making details documented in ED Course. Radiology: ordered and independent interpretation performed. Decision-making details documented in ED Course. ECG/medicine tests: ordered and independent interpretation performed. Decision-making details documented in ED Course.  Risk OTC drugs. Prescription drug management. Decision regarding hospitalization.   Pt with multiple medical problems and comorbidities and presenting today with a complaint that caries a high risk for morbidity and mortality.  Here today after a fall in the middle of the night.  Unclear what caused her fall.  Could have been tripped, less likely to be syncope as patient reports remembering the event but she is not 100% sure.  She does take Eliquis but does not think she hit her head.  She does have injury to the left hand and the ring is in place that will need to be removed.  She reports just feeling not herself but denies any chest pain, shortness of breath abdominal pain, nausea or vomiting.  She has no focal tenderness other than the hand.  Concern for possible electrolyte abnormality, UTI, COVID.  Also will ensure there is no head bleed given she did fall and cannot remember all the events and is on Eliquis.  Patient has had none of her typical medication and she was given a dose of  her amlodipine and metoprolol.  12:59 PM I have independently visualized and interpreted pt's images today.  Hand films are negative for fracture, head CT without evidence of intracranial bleeding, and chest x-ray without acute findings.  I independently interpreted patient's labs and EKG.  EKG with atrial fibrillation which is unchanged, COVID was negative, troponin within normal limits, CMP without acute findings, CBC with leukocytosis of 10.8 but otherwise unchanged, UA with concern for UTI with nitrites, large leukocytes and greater than 50 white cells.  On returning to the patient she reports she still does not feel herself feels generally weak and has continued to feel nausea.  Suspect it could be from a UTI.  A culture was sent.  Patient was given Rocephin but at this time she reports she does not feel comfortable going home.  Patient is also home alone and fell last night and was laying on the floor until someone came and found her this morning.  Feel that is reasonable that patient be admitted for treatment of UTI.  Her ring was removed at bedside and ice was applied.  Will consult the hospitalist for admission.  Patient is comfortable with this plan.          Final Clinical Impression(s) / ED Diagnoses Final diagnoses:  Fall, initial encounter  Contusion of finger of left hand, initial encounter  Acute cystitis without hematuria  Weakness    Rx /  DC Orders ED Discharge Orders     None         Gwyneth Sprout, MD 10/02/22 1300

## 2022-10-02 NOTE — H&P (Signed)
History and Physical    Patient: Linda Glass WUJ:[id number] DOB: [birth year] DOA: 10/02/2022 DOS: the patient was seen and examined on 10/02/2022 PCP: [name], [name], MD  Patient coming from: Home  Chief Complaint:  Chief Complaint  Patient presents with   Fall   HPI: Linda Glass is a 89 y.o. female with medical history significant of unspecified anemia, anxiety, herpes zoster, COVID-19, heart murmur, hyperlipidemia, hypertension, hearing loss, history of cecum mass, hearing loss, class I obesity now overweight, traumatic compression fracture of T8 who was brought to the emergency department via EMS after having a fall the previous night with inability to get up afterwards.  Her daughter called her this morning, got concerned as she did not answer.  Her daughter subsequently called her neighbor who came over and found her on the floor in her house hallway.  She is not sure whether she passed out or not.  No chest pain, palpitations, diaphoresis, PND, orthopnea, but occasionally develops pitting edema of the lower extremities.She denied fever, chills, rhinorrhea, sore throat, wheezing or hemoptysis.   No abdominal pain, nausea, emesis, diarrhea, constipation, melena or hematochezia.  No flank pain, dysuria, frequency or hematuria.  No polyuria, polydipsia, polyphagia or blurred vision.   Lab work: Her urine analysis was hazy with small hemoglobin, protein 30 mg deciliter, positive nitrites, large leukocyte esterase, 0-5 RBC, greater than 50 WBC, few bacteria and positive WBC clumps.  CBC showed a white count of 10.8 with 87% neutrophils, hemoglobin 13.6 g/dL and platelets 956.  Troponin level was normal.  Coronavirus PCR was negative.  CMP was unremarkable, except for CO2 of 21 mmol/L.  Imaging: Left hand x-ray with no acute displaced fracture or dislocation portable 1 view chest radiograph showing cardiomegaly.  Hiatal hernia.  No consolidation or edema.  Sclerotic area  along the proximal right humerus.  A chondroid lesion is possible.  Please correlate with any prior history or otherwise shoulder x-ray to be performed when clinically appropriate.  CT head without contrast with no evidence of acute intracranial injury.  There is mild chronic small vessel disease and volume loss.   ED course: Initial vital signs were temperature 97.9 degrees, pulse 74, respiration 18, BP 176/89 mmHg O2 sat 100% on room air.  The patient received 1000 mL of LR bolus, ceftriaxone 1 g IVPB x 1, acetaminophen 650 mg p.o. x 1, amlodipine 5 mg p.o. x 1, lorazepam 0.5 mg p.o. x 1 and metoprolol succinate 25 mg p.o. x 1.  Review of Systems: As mentioned in the history of present illness. All other systems reviewed and are negative. Past Medical History:  Diagnosis Date   Anemia 04/2020   Anxiety    Arthritis    Chicken pox    COVID-19    Heart murmur    Hyperlipidemia    Hypertension    Past Surgical History:  Procedure Laterality Date   ABDOMINAL HYSTERECTOMY  1983   BIOPSY  05/05/2020   Procedure: BIOPSY;  Surgeon: Napoleon Form, MD;  Location: [facility];  Service: Endoscopy;;  EGD and COLON   COLONOSCOPY WITH PROPOFOL N/A 05/05/2020   Procedure: COLONOSCOPY WITH PROPOFOL;  Surgeon: Napoleon Form, MD;  Location: [facility];  Service: Endoscopy;  Laterality: N/A;   ESOPHAGOGASTRODUODENOSCOPY (EGD) WITH PROPOFOL N/A 05/05/2020   Procedure: ESOPHAGOGASTRODUODENOSCOPY (EGD) WITH PROPOFOL;  Surgeon: Napoleon Form, MD;  Location: [facility];  Service: Endoscopy;  Laterality: N/A;   FRACTURE SURGERY     wrist fracture 2013  LAPAROSCOPIC PARTIAL COLECTOMY Right 03/12/2021   Procedure: LAPAROSCOPIC HAND ASSISTED RIGHT COLECTOMY;  Surgeon: Quentin Ore, MD;  Location: [facility];  Service: General;  Laterality: Right;   Social History:  reports that she has never smoked. She has never used smokeless tobacco. She reports that she does not currently use alcohol  after a past usage of about 7.0 standard drinks of alcohol per week. She reports that she does not use drugs.  Allergies  Allergen Reactions   Losartan Other (See Comments)    diarrhea    Family History  Problem Relation Age of Onset   Hyperlipidemia Mother    Diabetes Neg Hx     Prior to Admission medications   Medication Sig Start Date End Date Taking? Authorizing Provider  acetaminophen (TYLENOL) 325 MG tablet Take 2 tablets (650 mg total) by mouth every 6 (six) hours as needed for mild pain (or Fever >/= 101). 09/21/21  Yes Sheikh, Omair Latif, DO  amLODipine (NORVASC) 5 MG tablet TAKE ONE TABLET BY MOUTH DAILY 09/02/22  Yes Alver Sorrow, NP  atorvastatin (LIPITOR) 40 MG tablet TAKE 1 TABLET EACH DAY. Patient taking differently: Take 40 mg by mouth daily. 08/16/22  Yes [name], [name], MD  citalopram (CELEXA) 20 MG tablet Take 1 tablet (20 mg total) by mouth daily. 07/23/22  Yes [name], [name], MD  Cyanocobalamin (VITAMIN B 12 PO) Take 500 mcg by mouth daily.   Yes [provider]  ELIQUIS 5 MG TABS tablet TAKE ONE TABLET BY MOUTH TWICE DAILY Patient taking differently: Take 5 mg by mouth 2 (two) times daily. 09/23/22  Yes [name], [name], MD  Ferrous Gluconate 324 (37.5 Fe) MG TABS Take 324 mg by mouth every other day.   Yes [provider]  fluticasone (FLONASE) 50 MCG/ACT nasal spray Place 2 sprays into both nostrils daily. 10/25/21  Yes Karie Georges, MD  furosemide (LASIX) 80 MG tablet Take 1 tablet (80 mg total) by mouth daily. 01/21/22  Yes Chilton Si, MD  LORazepam (ATIVAN) 0.5 MG tablet Take 0.5-1 tablets (0.25-0.5 mg total) by mouth at bedtime as needed for anxiety. TAKE 1/2 TABLET AT BEDTIME AS NEEDED FOR ANXIETY Patient taking differently: Take 0.25-0.5 mg by mouth at bedtime as needed for anxiety. 07/23/22  Yes [name], [name], MD  magnesium oxide (MAG-OX) 400 (241.3 Mg) MG tablet Take 0.5 tablets (200 mg total) by mouth daily. Patient taking  differently: Take 400 mg by mouth daily. 05/12/20  Yes [place], Eric J, DO  metoprolol succinate (TOPROL-XL) 25 MG 24 hr tablet Take 1 tablet (25 mg total) by mouth daily. 04/08/22  Yes [name], [name], MD  Multiple Vitamin (MULTIVITAMIN WITH MINERALS) TABS tablet Take 1 tablet by mouth daily.   Yes [provider]  pantoprazole (PROTONIX) 40 MG tablet Take 1 tablet (40 mg total) by mouth daily. 03/06/22  Yes [name], [name], MD  spironolactone (ALDACTONE) 25 MG tablet Take 1 tablet (25 mg total) by mouth daily. 10/08/21 10/03/22 Yes Alver Sorrow, NP  ondansetron (ZOFRAN) 4 MG tablet Take 1 tablet (4 mg total) by mouth every 8 (eight) hours as needed for nausea or vomiting. 10/25/21   Karie Georges, MD    Physical Exam: Vitals:   10/02/22 0917 10/02/22 0920  BP: (!) 176/89   Pulse: 74   Resp: 18   Temp: 97.9 F (36.6 C)   TempSrc: Oral   SpO2: 100%   Weight:  65 kg  Height:  5\' 1"  (1.549 m)   Physical Exam Vitals and nursing note reviewed.  Constitutional:      General: She is awake. She is not in acute distress.    Appearance: Normal appearance. She is overweight.  HENT:     Head: Normocephalic.     Nose: No rhinorrhea.     Mouth/Throat:     Mouth: Mucous membranes are moist.  Eyes:     General: No scleral icterus.    Pupils: Pupils are equal, round, and reactive to light.  Neck:     Vascular: No JVD.  Cardiovascular:     Rate and Rhythm: Normal rate. Rhythm regularly irregular.     Heart sounds: S1 normal and S2 normal.  Pulmonary:     Effort: Pulmonary effort is normal.     Breath sounds: Normal breath sounds.  Abdominal:     General: Bowel sounds are normal.     Palpations: Abdomen is soft.  Musculoskeletal:     Cervical back: Neck supple.     Right lower leg: No edema.     Left lower leg: No edema.  Skin:    General: Skin is warm and dry.  Neurological:     General: No focal deficit present.     Mental Status: She is alert. Mental status is at  baseline.  Psychiatric:        Mood and Affect: Mood normal.        Behavior: Behavior normal. Behavior is cooperative.     Data Reviewed:  Results are pending, will review when available.  05/02/2020 transthoracic echocardiogram. IMPRESSIONS:   1. Left ventricular ejection fraction, by estimation, is 60 to 65%. The  left ventricle has normal function. The left ventricle has no regional  wall motion abnormalities. There is moderate left ventricular hypertrophy.  Left ventricular diastolic function   could not be evaluated.   2. Right ventricular systolic function is normal. The right ventricular  size is normal. There is moderately elevated pulmonary artery systolic  pressure. The estimated right ventricular systolic pressure is 53.9 mmHg.   3. Left atrial size was mildly dilated.   4. Right atrial size was mildly dilated.   5. The mitral valve is normal in structure. Mild mitral valve  regurgitation.   6. The aortic valve is calcified. There is mild calcification of the  aortic valve. There is mild thickening of the aortic valve. Aortic valve  regurgitation is not visualized. No aortic stenosis is present.   7. The inferior vena cava is dilated in size with <50% respiratory  variability, suggesting right atrial pressure of 15 mmHg.   EKG: Vent. rate 68 BPM PR interval * ms QRS duration 104 ms QT/QTcB 418/444 ms P-R-T axes * 11 47 Atrial fibrillation Incomplete right bundle branch block Septal infarct , age undetermine0-9  Assessment and Plan: Principal Problem:   Fall In the setting of:   Syncope Observation/telemetry. Received LR 1000 mL bolus. Hold diuretic today. Correct electrolyte abnormality. Check carotid Doppler. Check echocardiogram.  Active Problems:   Acute UTI (urinary tract infection) Continue ceftriaxone 1 g IVPB daily. Follow-up urine culture and sensitivity.    Persistent atrial fibrillation (HCC) CHA?DS?-VASc Score of at least 5. Continue  apixaban 5 mg p.o. twice daily. Continue metoprolol for rate control.    Hyperlipidemia, mixed   Atherosclerosis of aorta (HCC) Continue atorvastatin 40 mg p.o. daily.    Hypertension, essential, benign Hold diuretic today. Continue metoprolol succinate 25 mg p.o. daily. Continue amlodipine 5 mg  p.o. daily.    Anxiety disorder Continue escitalopram 20 mg p.o. daily.    Insomnia disorder Continue lorazepam 0.5 mg p.o. at bedtime nightly.      Advance Care Planning:   Code Status: Full Code   Consults:   Family Communication:   Severity of Illness: The appropriate patient status for this patient is INPATIENT. Inpatient status is judged to be reasonable and necessary in order to provide the required intensity of service to ensure the patient's safety. The patient's presenting symptoms, physical exam findings, and initial radiographic and laboratory data in the context of their chronic comorbidities is felt to place them at high risk for further clinical deterioration. Furthermore, it is not anticipated that the patient will be medically stable for discharge from the hospital within 2 midnights of admission.   * I certify that at the point of admission it is my clinical judgment that the patient will require inpatient hospital care spanning beyond 2 midnights from the point of admission due to high intensity of service, high risk for further deterioration and high frequency of surveillance required.*  Author: Bobette Mo, MD 10/02/2022 1:23 PM  For on call review [email or web address].   This document was prepared using Dragon voice recognition software and may contain some unintended transcription errors.

## 2022-10-03 ENCOUNTER — Inpatient Hospital Stay (HOSPITAL_BASED_OUTPATIENT_CLINIC_OR_DEPARTMENT_OTHER): Payer: Medicare Other

## 2022-10-03 DIAGNOSIS — B9620 Unspecified Escherichia coli [E. coli] as the cause of diseases classified elsewhere: Secondary | ICD-10-CM | POA: Diagnosis not present

## 2022-10-03 DIAGNOSIS — N390 Urinary tract infection, site not specified: Secondary | ICD-10-CM | POA: Diagnosis not present

## 2022-10-03 DIAGNOSIS — R55 Syncope and collapse: Secondary | ICD-10-CM

## 2022-10-03 DIAGNOSIS — W19XXXA Unspecified fall, initial encounter: Secondary | ICD-10-CM | POA: Diagnosis not present

## 2022-10-03 LAB — GLUCOSE, CAPILLARY: Glucose-Capillary: 106 mg/dL — ABNORMAL HIGH (ref 70–99)

## 2022-10-03 MED ORDER — AMLODIPINE BESYLATE 10 MG PO TABS
5.0000 mg | ORAL_TABLET | Freq: Once | ORAL | Status: AC
  Administered 2022-10-03: 5 mg via ORAL
  Filled 2022-10-03: qty 1

## 2022-10-03 MED ORDER — SACCHAROMYCES BOULARDII 250 MG PO CAPS
250.0000 mg | ORAL_CAPSULE | Freq: Two times a day (BID) | ORAL | Status: DC
  Administered 2022-10-03 – 2022-10-04 (×3): 250 mg via ORAL
  Filled 2022-10-03 (×3): qty 1

## 2022-10-03 MED ORDER — HYDRALAZINE HCL 20 MG/ML IJ SOLN
5.0000 mg | Freq: Four times a day (QID) | INTRAMUSCULAR | Status: DC | PRN
  Administered 2022-10-03: 5 mg via INTRAVENOUS
  Filled 2022-10-03: qty 1

## 2022-10-03 MED ORDER — AMLODIPINE BESYLATE 10 MG PO TABS
10.0000 mg | ORAL_TABLET | Freq: Every day | ORAL | Status: DC
  Administered 2022-10-04: 10 mg via ORAL
  Filled 2022-10-03: qty 1

## 2022-10-03 NOTE — Plan of Care (Signed)
Up with assistance. Monitor vital signs; prn b/p med as needed. Monitor labs. Reinforce education.

## 2022-10-03 NOTE — Progress Notes (Signed)
Carotid artery duplex has been completed. Preliminary results can be found in CV Proc through chart review.   10/03/22 9:25 AM Olen Cordial RVT

## 2022-10-03 NOTE — TOC Initial Note (Signed)
Transition of Care [facility]) - Initial/Assessment Note    Patient Details  Name: Linda Glass MRN: [id number] Date of Birth: 06/24/1933  Transition of [facility]) CM/SW Contact:    Larrie Kass, LCSW Phone Number: 10/03/2022, 1:10 PM  Clinical Narrative:                  Pt is rec for PT/OT eval. TOC to follow for d/c need.   Barriers to Discharge: Continued Medical Work up   Patient Goals and CMS Choice            Expected Discharge Plan and Services       Living arrangements for the past 2 months: Single Family Home                                      Prior Living Arrangements/Services Living arrangements for the past 2 months: Single Family Home Lives with:: Self   Do you feel safe going back to the place where you live?: Yes      Need for Family Participation in Patient Care: Yes (Comment) Care giver support system in place?: No (comment)      Activities of Daily Living Home Assistive Devices/Equipment: Walker (specify type) (front wheel) ADL Screening (condition at time of admission) Patient's cognitive ability adequate to safely complete daily activities?: Yes Is the patient deaf or have difficulty hearing?: Yes Does the patient have difficulty seeing, even when wearing glasses/contacts?: No Does the patient have difficulty concentrating, remembering, or making decisions?: No Patient able to express need for assistance with ADLs?: Yes Does the patient have difficulty dressing or bathing?: Yes Independently performs ADLs?: No Communication: Independent Dressing (OT): Needs assistance Is this a change from baseline?: Change from baseline, expected to last <3days Grooming: Needs assistance Is this a change from baseline?: Change from baseline, expected to last <3 days Feeding: Independent Bathing: Needs assistance Is this a change from baseline?: Change from baseline, expected to last <3 days Toileting: Needs assistance Is  this a change from baseline?: Change from baseline, expected to last <3 days In/Out Bed: Needs assistance Is this a change from baseline?: Change from baseline, expected to last <3 days Walks in Home: Independent with device (comment) Does the patient have difficulty walking or climbing stairs?: Yes Weakness of Legs: Both Weakness of Arms/Hands: Both (mild)  Permission Sought/Granted                  Emotional Assessment Appearance:: Appears stated age Attitude/Demeanor/Rapport: Gracious Affect (typically observed): Accepting Orientation: : Oriented to Self, Oriented to Place, Oriented to  Time, Oriented to Situation, Fluctuating Orientation (Suspected and/or reported Sundowners)      Admission diagnosis:  Weakness [R53.1] Fall [W19.XXXA] Acute cystitis without hematuria [N30.00] Fall, initial encounter [W19.XXXA] Contusion of finger of left hand, initial encounter [S60.00XA] UTI (urinary tract infection) [N39.0] Patient Active Problem List   Diagnosis Date Noted   UTI (urinary tract infection) 10/03/2022   Fall 10/02/2022   Syncope 10/02/2022   Acute UTI (urinary tract infection) 10/02/2022   Hypercoagulable state (HCC) 07/27/2022   Cerumen impaction 10/11/2021   Hearing loss 10/11/2021   AKI (acute kidney injury) (HCC) 09/17/2021   Diarrhea 09/17/2021   Hyponatremia 09/17/2021   Metabolic acidosis 09/17/2021   Traumatic compression fracture of T8 vertebra (HCC) 09/17/2021   Breast mass, right 09/17/2021   Ovarian cyst 09/17/2021   Colonic mass 03/12/2021  Atherosclerosis of aorta (HCC) 01/05/2021   Multiple gastric ulcers    Cecum mass    2019 novel coronavirus disease (COVID-19) 05/02/2020   Persistent atrial fibrillation (HCC) 05/01/2020   Hypertension, essential, benign 07/05/2019   Anxiety disorder 07/05/2019   Insomnia disorder 08/21/2016   Class 1 obesity 08/21/2016   Hyperlipidemia, mixed 08/21/2016   PCP:  [name], [name], MD Pharmacy:   [facility] [place], [place] - [address] [facility] [address] [place] [address] Phone: [phone] Fax: [phone]     Social Determinants of Health (SDOH) Social History: SDOH Screenings   Food Insecurity: No Food Insecurity (10/03/2022)  Housing: Low Risk  (10/03/2022)  Transportation Needs: No Transportation Needs (10/03/2022)  Utilities: Not At Risk (10/03/2022)  Alcohol Screen: Low Risk  (08/21/2022)  Depression (PHQ2-9): Low Risk  (08/21/2022)  Financial Resource Strain: Low Risk  (08/21/2022)  Physical Activity: Insufficiently Active (08/21/2022)  Social Connections: Socially Isolated (08/21/2022)  Stress: No Stress Concern Present (08/21/2022)  Tobacco Use: Low Risk  (10/02/2022)   SDOH Interventions:     Readmission Risk Interventions     No data to display

## 2022-10-03 NOTE — Evaluation (Signed)
Physical Therapy One Time Evaluation Patient Details Name: Linda Glass MRN: [id number] DOB: 1934/02/15 Today's Date: 10/03/2022  History of Present Illness  Pt is a 89 year old female admitted 10/02/22 after fall at home in setting of syncope and acute UTI. PMH: AKI, HLD, HTN, PAF, anemia, anxiety, severe T8 and T12 compression fx  Clinical Impression  Patient evaluated by Physical Therapy with no further acute PT needs identified. All education has been completed and the patient has no further questions.  Pt appears at baseline in regards to her mobility and agreeable to no further acute PT needs.  Pt would benefit from ambulating with nursing staff and/or mobility specialists during hospitalization. See below for any follow-up Physical Therapy or equipment needs. PT is signing off. Thank you for this referral.     Vitals obtained after ambulating (RN notified):  10/03/22 1440  Vital Signs  Pulse Rate 80  Pulse Rate Source Dinamap  BP (!) 197/91  BP Location Left Arm  BP Method Automatic  Patient Position (if appropriate) Lying  Oxygen Therapy  SpO2 100 %  O2 Device Room Air            If plan is discharge home, recommend the following:     Can travel by private vehicle        Equipment Recommendations None recommended by PT  Recommendations for Other Services       Functional Status Assessment Patient has not had a recent decline in their functional status     Precautions / Restrictions Precautions Precautions: Fall      Mobility  Bed Mobility Overal bed mobility: Modified Independent                  Transfers Overall transfer level: Needs assistance Equipment used: Rolling walker (2 wheels) Transfers: Sit to/from Stand Sit to Stand: Contact guard assist, Supervision           General transfer comment: initial contact guard however pt mobilizing well and denies sypmtoms, supervision for toilet transfers     Ambulation/Gait Ambulation/Gait assistance: Contact guard assist, Supervision Gait Distance (Feet): 60 Feet Assistive device: Rolling walker (2 wheels) Gait Pattern/deviations: Step-through pattern, Decreased stride length       General Gait Details: pt preferred to ambulate in room only, performed a few laps around room, denies symptoms, no unsteadiness or LOB with use of RW  Stairs            Wheelchair Mobility     Tilt Bed    Modified Rankin (Stroke Patients Only)       Balance Overall balance assessment: History of Falls                             High Level Balance Comments: able to stand and wash hands at sink without UE support, typically uses RW for ambulation at baseline             Pertinent Vitals/Pain Pain Assessment Pain Assessment: No/denies pain    Home Living Family/patient expects to be discharged to:: Private residence Living Arrangements: Alone Available Help at Discharge: Neighbor;Available PRN/intermittently Type of Home: House Home Access: Level entry       Home Layout: Able to live on main level with bedroom/bathroom Home Equipment: Rolling Walker (2 wheels)      Prior Function Prior Level of Function : Independent/Modified Independent  Mobility Comments: uses RW       Extremity/Trunk Assessment   Upper Extremity Assessment Upper Extremity Assessment: Right hand dominant    Lower Extremity Assessment Lower Extremity Assessment: Overall WFL for tasks assessed    Cervical / Trunk Assessment Cervical / Trunk Assessment: Kyphotic;Other exceptions Cervical / Trunk Exceptions: ecchymosis observed along central mid back  Communication   Communication Communication: Hearing impairment  Cognition Arousal: Alert Behavior During Therapy: WFL for tasks assessed/performed Overall Cognitive Status: Within Functional Limits for tasks assessed                                           General Comments      Exercises     Assessment/Plan    PT Assessment Patient does not need any further PT services  PT Problem List         PT Treatment Interventions      PT Goals (Current goals can be found in the Care Plan section)  Acute Rehab PT Goals PT Goal Formulation: All assessment and education complete, DC therapy    Frequency       Co-evaluation               AM-PAC PT "6 Clicks" Mobility  Outcome Measure Help needed turning from your back to your side while in a flat bed without using bedrails?: None Help needed moving from lying on your back to sitting on the side of a flat bed without using bedrails?: None Help needed moving to and from a bed to a chair (including a wheelchair)?: None Help needed standing up from a chair using your arms (e.g., wheelchair or bedside chair)?: A Little Help needed to walk in hospital room?: A Little Help needed climbing 3-5 steps with a railing? : A Little 6 Click Score: 21    End of Session Equipment Utilized During Treatment: Gait belt Activity Tolerance: Patient tolerated treatment well Patient left: with call bell/phone within reach;in bed Nurse Communication: Mobility status PT Visit Diagnosis: Difficulty in walking, not elsewhere classified (R26.2)    Time: 2130-8657 PT Time Calculation (min) (ACUTE ONLY): 15 min   Charges:   PT Evaluation $PT Eval Low Complexity: 1 Low   PT General Charges $$ ACUTE PT VISIT: 1 Visit        Thomasene Mohair PT, DPT Physical Therapist Acute Rehabilitation Services Office: [phone]   Linda Glass 10/03/2022, 3:15 PM

## 2022-10-03 NOTE — Care Management Obs Status (Signed)
MEDICARE OBSERVATION STATUS NOTIFICATION   Patient Details  Name: Linda Glass MRN: [id number] Date of Birth: [birth year]   Medicare Observation Status Notification Given:  Yes    Larrie Kass, LCSW 10/03/2022, 11:13 AM

## 2022-10-03 NOTE — Plan of Care (Signed)
  Problem: Education: Goal: Knowledge of condition and prescribed therapy will improve Outcome: Not Progressing   Problem: Cardiac: Goal: Will achieve and/or maintain adequate cardiac output Outcome: Not Progressing

## 2022-10-03 NOTE — Hospital Course (Addendum)
Brief hospital course: PMH of HLD, HTN, hearing loss, obesity presented to the hospital with complaints of a fall with inability to get up after that.  Neighbor found her on the floor. Found to have E. coli UTI.  Also has diarrhea and AKI. Assessment and Plan: E. coli UTI. Currently being treated with IV ceftriaxone. Will monitor cultures and sensitivity.  Unwitnessed fall secondary to suspected syncope. Possibly in the setting of acute infection. Currently no focal deficit. Echocardiogram shows preserved EF. Telemetry so far negative as well. Orthostatic vitals were negative at the time of presentation. Carotid Doppler unremarkable as well. Monitor.  Persistent A-fib. On Eliquis which we will continue. CT of the head was performed which is unremarkable.  Hypertension, accelerated. Blood pressure elevated. Continuing home regimen.  Increasing amlodipine.  Monitor.  Anxiety. Insomnia  continue home regimen.  HLD.  continuing statin.  Mild thrombocytopenia. Platelet count trending down likely in the setting of acute infection and dilution. Will monitor.  Macrocytosis. B12 level in 2023 were adequate.   we will recheck.  Diarrhea. Patient reported for loose watery BM today. No abdominal pain. No nausea or vomiting.  No blood in the stool but concern check C. difficile.  Add probiotics.

## 2022-10-03 NOTE — Progress Notes (Signed)
[facility] Progress Note Patient: Linda Glass ZOX:[id number] DOB: 09-01-1933 DOA: 10/02/2022  DOS: the patient was seen and examined on 10/03/2022  Brief hospital course: PMH of HLD, HTN, hearing loss, obesity presented to the hospital with complaints of a fall with inability to get up after that.  Neighbor found her on the floor. Found to have E. coli UTI.  Also has diarrhea and AKI. Assessment and Plan: E. coli UTI. Currently being treated with IV ceftriaxone. Will monitor cultures and sensitivity.  Unwitnessed fall secondary to suspected syncope. Possibly in the setting of acute infection. Currently no focal deficit. Echocardiogram shows preserved EF. Telemetry so far negative as well. Orthostatic vitals were negative at the time of presentation. Carotid Doppler unremarkable as well. Monitor.  Persistent A-fib. On Eliquis which we will continue. CT of the head was performed which is unremarkable.  Hypertension, accelerated. Blood pressure elevated. Continuing home regimen.  Increasing amlodipine.  Monitor.  Anxiety. Insomnia  continue home regimen.  HLD.  continuing statin.  Mild thrombocytopenia. Platelet count trending down likely in the setting of acute infection and dilution. Will monitor.  Macrocytosis. B12 level in 2023 were adequate.   we will recheck.  Diarrhea. Patient reported for loose watery BM today. No abdominal pain. No nausea or vomiting.  No blood in the stool but concern check C. difficile.  Add probiotics.   Subjective: No nausea or vomiting.  4 BM today.  Physical Exam: General: in Mild distress, No Rash Cardiovascular: S1 and S2 Present, No Murmur Respiratory: Good respiratory effort, Bilateral Air entry present. No Crackles, No wheezes Abdomen: Bowel Sound present, No tenderness Extremities: No edema Neuro: Alert and oriented x3, no new focal deficit  Data Reviewed: I have Reviewed nursing notes, Vitals, and Lab  results. Since last encounter, pertinent lab results CBC and BMP   . I have ordered test including CBC BMP and magnesium  .   Disposition: Status is: Observation  apixaban (ELIQUIS) tablet 5 mg   Family Communication: No one at bedside Level of care: Telemetry   Vitals:   10/03/22 0522 10/03/22 0928 10/03/22 1328 10/03/22 1440  BP: (!) 178/83 (!) 167/74 (!) 160/64 (!) 197/91  Pulse: 79 72 (!) 22 80  Resp: 20     Temp: 98.2 F (36.8 C)  97.7 F (36.5 C)   TempSrc: Oral  Oral   SpO2: 97%  98% 100%  Weight:      Height:         Author: Lynden Oxford, MD 10/03/2022 4:46 PM  Please look on [email or web address] to find out who is on call.

## 2022-10-03 NOTE — Care Management CC44 (Signed)
Condition Code 44 Documentation Completed  Patient Details  Name: Madyson Hamlette MRN: [id number] Date of Birth: September 25, 1933   Condition Code 44 given:  Yes Patient signature on Condition Code 44 notice:  Yes Documentation of 2 MD's agreement:  Yes Code 44 added to claim:  Yes    Valentina Shaggy Hesston Hitchens, LCSW 10/03/2022, 11:13 AM

## 2022-10-04 ENCOUNTER — Ambulatory Visit: Payer: Medicare Other

## 2022-10-04 DIAGNOSIS — B9620 Unspecified Escherichia coli [E. coli] as the cause of diseases classified elsewhere: Secondary | ICD-10-CM | POA: Diagnosis not present

## 2022-10-04 DIAGNOSIS — N390 Urinary tract infection, site not specified: Secondary | ICD-10-CM | POA: Diagnosis not present

## 2022-10-04 DIAGNOSIS — R55 Syncope and collapse: Secondary | ICD-10-CM

## 2022-10-04 DIAGNOSIS — W19XXXA Unspecified fall, initial encounter: Secondary | ICD-10-CM | POA: Diagnosis not present

## 2022-10-04 LAB — GLUCOSE, CAPILLARY: Glucose-Capillary: 114 mg/dL — ABNORMAL HIGH (ref 70–99)

## 2022-10-04 MED ORDER — AMLODIPINE BESYLATE 10 MG PO TABS: 10.0000 mg | ORAL_TABLET | Freq: Every day | ORAL | 0 refills | Status: DC

## 2022-10-04 MED ORDER — SPIRONOLACTONE 25 MG PO TABS: 25.0000 mg | ORAL_TABLET | Freq: Every day | ORAL | 3 refills | Status: DC

## 2022-10-04 MED ORDER — CEFADROXIL 500 MG PO CAPS
1000.0000 mg | ORAL_CAPSULE | Freq: Two times a day (BID) | ORAL | Status: DC
  Administered 2022-10-04: 1000 mg via ORAL
  Filled 2022-10-04: qty 2

## 2022-10-04 MED ORDER — SACCHAROMYCES BOULARDII 250 MG PO CAPS: 250.0000 mg | ORAL_CAPSULE | Freq: Two times a day (BID) | ORAL | 0 refills | Status: AC

## 2022-10-04 MED ORDER — CEFADROXIL 500 MG PO CAPS: 1000.0000 mg | ORAL_CAPSULE | Freq: Two times a day (BID) | ORAL | 0 refills | Status: AC

## 2022-10-04 NOTE — Evaluation (Signed)
Occupational Therapy Evaluation Patient Details Name: Linda Glass MRN: [id number] DOB: 02-May-1933 Today's Date: 10/04/2022   History of Present Illness Pt is a 89 year old female admitted 10/02/22 after fall at home in setting of  suspected syncope and acute UTI. PMH: AKI, HLD, HTN, PAF, anemia, anxiety, severe T8 compression fx   Clinical Impression   The pt performed all assessed tasks with supervision or better, including lower body dressing, sit to stand, toileting at bathroom level, and grooming standing at the sink. She appears to be near her baseline level of functioning for self-care management & she is not presenting with acute functional deficits that warrant the need for further OT services. OT will sign off and recommend she return home at discharge.       If plan is discharge home, recommend the following: Assistance with cooking/housework;Assist for transportation;Direct supervision/assist for medications management    Functional Status Assessment  Patient has not had a recent decline in their functional status  Equipment Recommendations  None recommended by OT    Recommendations for Other Services       Precautions / Restrictions Restrictions Weight Bearing Restrictions: No Other Position/Activity Restrictions: hearing impaired      Mobility Bed Mobility Overal bed mobility: Modified Independent Bed Mobility: Supine to Sit     Supine to sit: Modified independent (Device/Increase time)          Transfers Overall transfer level: Needs assistance Equipment used: Rolling walker (2 wheels) Transfers: Sit to/from Stand Sit to Stand: Supervision                  Balance Overall balance assessment: Mild deficits observed, not formally tested         ADL either performed or assessed with clinical judgement   ADL    General ADL Comments: She is independent with feeding. She performed lower body dressing/sock management seated EOB,  toileting at bathroom level, and grooming standing at the sink with supervision.      Pertinent Vitals/Pain Pain Assessment Pain Location: She reported having "a little" L hand pain. Bruising noted. Pain Intervention(s): Limited activity within patient's tolerance, Monitored during session     Extremity/Trunk Assessment Upper Extremity Assessment Upper Extremity Assessment: Overall WFL for tasks assessed   Lower Extremity Assessment Lower Extremity Assessment: Overall WFL for tasks assessed       Communication Communication Communication: Hearing impairment   Cognition Arousal: Alert Behavior During Therapy: WFL for tasks assessed/performed Overall Cognitive Status: Within Functional Limits for tasks assessed      General Comments: Oriented x4, able to follow commands without difficulty                Home Living Family/patient expects to be discharged to:: Private residence Living Arrangements: Alone Available Help at Discharge: Neighbor;Available PRN/intermittently Type of Home: House       Home Layout: Able to live on main level with bedroom/bathroom;Two level               Home Equipment: Agricultural consultant (2 wheels);BSC/3in1          Prior Functioning/Environment Prior Level of Function : Independent/Modified Independent             Mobility Comments: She uses a RW for ambulation. ADLs Comments: Pt was independent with ADLs and driving; she had hired assist for cleaning.              OT Treatment/Interventions:   No further treatment needs identified  OT Frequency:  N/A       AM-PAC OT "6 Clicks" Daily Activity     Outcome Measure Help from another person eating meals?: None Help from another person taking care of personal grooming?: None Help from another person toileting, which includes using toliet, bedpan, or urinal?: A Little Help from another person bathing (including washing, rinsing, drying)?: A Little Help from another  person to put on and taking off regular upper body clothing?: None Help from another person to put on and taking off regular lower body clothing?: A Little 6 Click Score: 21   End of Session Equipment Utilized During Treatment: Rolling walker (2 wheels) Nurse Communication: Mobility status  Activity Tolerance: Patient tolerated treatment well Patient left: in chair;with call bell/phone within reach;with chair alarm set  OT Visit Diagnosis: Unsteadiness on feet (R26.81)                Time: 1610-9604 OT Time Calculation (min): 22 min Charges:  OT General Charges $OT Visit: 1 Visit OT Evaluation $OT Eval Low Complexity: 1 Low    Paiton Fosco L Darlene Bartelt, OTR/L 10/04/2022, 12:53 PM

## 2022-10-04 NOTE — Progress Notes (Unsigned)
Enrolled patient for a 14 day Zio XT monitor to be mailed to patients home  DOD to read

## 2022-10-05 NOTE — Discharge Summary (Signed)
Physician Discharge Summary   Patient: Linda Glass MRN: [id number] DOB: 1933/07/01  Admit date:     10/02/2022  Discharge date: 10/04/2022  Discharge Physician: Lynden Oxford  PCP: [name], [name], MD  Recommendations at discharge: Follow-up with PCP in 1 week. Follow-up with cardiology. Outpatient Zio patch recommended.   Follow-up Information     [name], [name], MD. Schedule an appointment as soon as possible for a visit in 1 week(s).   Specialty: Family Medicine Contact information: [address] [place] [address] [phone]         Alver Sorrow, NP. Schedule an appointment as soon as possible for a visit in 1 month(s).   Specialty: Cardiology Contact information: [address] [place] [address] [phone]                Discharge Diagnoses: Principal Problem:   Fall Active Problems:   Insomnia disorder   Hyperlipidemia, mixed   Hypertension, essential, benign   Anxiety disorder   Persistent atrial fibrillation (HCC)   Atherosclerosis of aorta (HCC)   Syncope   Acute UTI (urinary tract infection)   UTI (urinary tract infection)  Brief hospital course: PMH of HLD, HTN, hearing loss, obesity presented to the hospital with complaints of a fall with inability to get up after that.  Neighbor found her on the floor. Found to have E. coli UTI.  Also has diarrhea and AKI. Assessment and Plan: E. coli UTI. Currently being treated with IV ceftriaxone.  Based on the sensitivity.  Will switch to p.o. and discharged home.  Unwitnessed fall secondary to suspected syncope. Possibly in the setting of acute infection. Currently no focal deficit. Echocardiogram shows preserved EF. Telemetry so far negative as well. Orthostatic vitals were negative at the time of presentation. Carotid Doppler unremarkable as well. While UTI can explain some dizziness unsure whether that is completely able to explain a passing out event  and therefore will recommend outpatient Zio patch monitor.  Persistent A-fib. On Eliquis which we will continue. CT of the head was performed which is unremarkable.  Hypertension, accelerated. Blood pressure elevated. Continuing home regimen.  Increasing amlodipine.  Monitor.  Anxiety. Insomnia  continue home regimen.  HLD.  continuing statin.  Mild thrombocytopenia. Platelet count trending down likely in the setting of acute infection and dilution. Will monitor.  Macrocytosis. B12 level in 2023 were adequate.   we will recheck.  Diarrhea. Patient reported for loose watery BM today. No abdominal pain. No nausea or vomiting.   Obesity Body mass index is 30.08 kg/m.  Placing the pt at higher risk of poor outcomes.  Consultants:  None  Procedures performed:  None  DISCHARGE MEDICATION: Allergies as of 10/04/2022       Reactions   Losartan Other (See Comments)   diarrhea        Medication List     TAKE these medications    acetaminophen 325 MG tablet Commonly known as: TYLENOL Take 2 tablets (650 mg total) by mouth every 6 (six) hours as needed for mild pain (or Fever >/= 101).   amLODipine 10 MG tablet Commonly known as: NORVASC Take 1 tablet (10 mg total) by mouth daily. What changed:  medication strength how much to take   atorvastatin 40 MG tablet Commonly known as: LIPITOR TAKE 1 TABLET EACH DAY. What changed:  how much to take how to take this when to take this additional instructions   cefadroxil 500 MG capsule Commonly known as: DURICEF  Take 2 capsules (1,000 mg total) by mouth 2 (two) times daily for 3 days.   citalopram 20 MG tablet Commonly known as: CELEXA Take 1 tablet (20 mg total) by mouth daily.   Eliquis 5 MG Tabs tablet Generic drug: apixaban TAKE ONE TABLET BY MOUTH TWICE DAILY What changed: how much to take   Ferrous Gluconate 324 (37.5 Fe) MG Tabs Take 324 mg by mouth every other day.   fluticasone 50 MCG/ACT  nasal spray Commonly known as: FLONASE Place 2 sprays into both nostrils daily.   furosemide 80 MG tablet Commonly known as: LASIX Take 1 tablet (80 mg total) by mouth daily.   LORazepam 0.5 MG tablet Commonly known as: ATIVAN Take 0.5-1 tablets (0.25-0.5 mg total) by mouth at bedtime as needed for anxiety. TAKE 1/2 TABLET AT BEDTIME AS NEEDED FOR ANXIETY What changed: additional instructions   magnesium oxide 400 (241.3 Mg) MG tablet Commonly known as: MAG-OX Take 0.5 tablets (200 mg total) by mouth daily. What changed: how much to take   metoprolol succinate 25 MG 24 hr tablet Commonly known as: TOPROL-XL Take 1 tablet (25 mg total) by mouth daily.   multivitamin with minerals Tabs tablet Take 1 tablet by mouth daily.   ondansetron 4 MG tablet Commonly known as: Zofran Take 1 tablet (4 mg total) by mouth every 8 (eight) hours as needed for nausea or vomiting.   pantoprazole 40 MG tablet Commonly known as: PROTONIX Take 1 tablet (40 mg total) by mouth daily.   saccharomyces boulardii 250 MG capsule Commonly known as: FLORASTOR Take 1 capsule (250 mg total) by mouth 2 (two) times daily for 7 days.   spironolactone 25 MG tablet Commonly known as: ALDACTONE Take 1 tablet (25 mg total) by mouth daily.   VITAMIN B 12 PO Take 500 mcg by mouth daily.       Disposition: Home Diet recommendation: Regular diet  Discharge Exam: Vitals:   10/04/22 0446 10/04/22 0500 10/04/22 0821 10/04/22 1232  BP: (!) 158/73  (!) 173/82 (!) 167/85  Pulse: 81  70 77  Resp: 20     Temp: 98.6 F (37 C)   98 F (36.7 C)  TempSrc: Oral   Oral  SpO2: 98%   94%  Weight:  72.2 kg    Height:       General: Appear in mild distress; no visible Abnormal Neck Mass Or lumps, Conjunctiva normal Cardiovascular: S1 and S2 Present, no Murmur, Respiratory: good respiratory effort, Bilateral Air entry present and CTA, no Crackles, no wheezes Abdomen: Bowel Sound present, Non tender  Extremities:  no Pedal edema Neurology: alert and oriented to time, place, and person  Filed Weights   10/02/22 0920 10/03/22 0307 10/04/22 0500  Weight: 65 kg 73 kg 72.2 kg   Condition at discharge: stable  The results of significant diagnostics from this hospitalization (including imaging, microbiology, ancillary and laboratory) are listed below for reference.   Imaging Studies: ECHOCARDIOGRAM COMPLETE  Result Date: 10/03/2022    ECHOCARDIOGRAM REPORT   Patient Name:   Linda Glass Date of Exam: 10/03/2022 Medical Rec #:  [id number]                   Height:       61.0 in Accession #:    [id number]                  Weight:       160.9 lb Date of Birth:  [birth year]  BSA:          1.722 m Patient Age:    89 years                    BP:           167/74 mmHg Patient Gender: F                           HR:           67 bpm. Exam Location:  Inpatient Procedure: 2D Echo, Cardiac Doppler and Color Doppler Indications:    Syncope  History:        Patient has prior history of Echocardiogram examinations, most                 recent 05/02/2020. Arrythmias:Atrial Fibrillation,                 Signs/Symptoms:Syncope; Risk Factors:Dyslipidemia and                 Hypertension.  Sonographer:    Wallie Char Referring Phys: [id number] DAVID MANUEL ORTIZ IMPRESSIONS  1. Left ventricular ejection fraction, by estimation, is 65 to 70%. The left ventricle has normal function. The left ventricle has no regional wall motion abnormalities. There is mild asymmetric left ventricular hypertrophy of the basal-septal segment. Left ventricular diastolic parameters are indeterminate.  2. Right ventricular systolic function is normal. The right ventricular size is mildly enlarged.  3. Left atrial size was severely dilated.  4. Right atrial size was severely dilated.  5. The mitral valve is degenerative. Mild to moderate mitral valve regurgitation. Mitral valve regurgitation is anteriorly directed and eccentric, suspect  posterior leaflet prolapse and primary mitral valve regurgitation.  6. Tricuspid valve regurgitation is moderate.  7. The aortic valve is tricuspid. There is mild calcification of the aortic valve. There is mild thickening of the aortic valve. Aortic valve regurgitation is not visualized. Aortic valve sclerosis/calcification is present, without any evidence of aortic stenosis.  8. IVC not visualized. FINDINGS  Left Ventricle: Left ventricular ejection fraction, by estimation, is 65 to 70%. The left ventricle has normal function. The left ventricle has no regional wall motion abnormalities. The left ventricular internal cavity size was normal in size. There is  mild asymmetric left ventricular hypertrophy of the basal-septal segment. Left ventricular diastolic parameters are indeterminate. Right Ventricle: The right ventricular size is mildly enlarged. No increase in right ventricular wall thickness. Right ventricular systolic function is normal. Left Atrium: Left atrial size was severely dilated. Right Atrium: Right atrial size was severely dilated. Pericardium: There is no evidence of pericardial effusion. Mitral Valve: The mitral valve is degenerative in appearance. Mild to moderate mitral annular calcification. Mild to moderate mitral valve regurgitation, with anteriorly-directed jet. No evidence of mitral valve stenosis. MV peak gradient, 11.4 mmHg. The  mean mitral valve gradient is 4.0 mmHg. Tricuspid Valve: The tricuspid valve is grossly normal. Tricuspid valve regurgitation is moderate. Aortic Valve: The aortic valve is tricuspid. There is mild calcification of the aortic valve. There is mild thickening of the aortic valve. Aortic valve regurgitation is not visualized. Aortic valve sclerosis/calcification is present, without any evidence of aortic stenosis. Aortic valve mean gradient measures 5.5 mmHg. Aortic valve peak gradient measures 10.0 mmHg. Aortic valve area, by VTI measures 1.88 cm. Pulmonic Valve:  The pulmonic valve was grossly normal. Pulmonic valve regurgitation is mild. Aorta: The aortic root is normal in size  and structure. Venous: The inferior vena cava was not well visualized. IAS/Shunts: The interatrial septum was not assessed.  LEFT VENTRICLE PLAX 2D LVIDd:         4.30 cm     Diastology LVIDs:         2.80 cm     LV e' medial:    8.20 cm/s LV PW:         0.90 cm     LV E/e' medial:  19.9 LV IVS:        1.30 cm     LV e' lateral:   12.80 cm/s LVOT diam:     1.80 cm     LV E/e' lateral: 12.7 LV SV:         60 LV SV Index:   35 LVOT Area:     2.54 cm  LV Volumes (MOD) LV vol d, MOD A2C: 64.2 ml LV vol d, MOD A4C: 78.2 ml LV vol s, MOD A2C: 17.9 ml LV vol s, MOD A4C: 19.7 ml LV SV MOD A2C:     46.3 ml LV SV MOD A4C:     78.2 ml LV SV MOD BP:      52.0 ml RIGHT VENTRICLE RV Basal diam:  5.50 cm RV S prime:     17.20 cm/s TAPSE (M-mode): 2.3 cm LEFT ATRIUM              Index        RIGHT ATRIUM           Index LA diam:        4.90 cm  2.85 cm/m   RA Area:     26.50 cm LA Vol (A2C):   101.0 ml 58.65 ml/m  RA Volume:   81.10 ml  47.09 ml/m LA Vol (A4C):   98.4 ml  57.14 ml/m LA Biplane Vol: 104.0 ml 60.39 ml/m  AORTIC VALVE AV Area (Vmax):    2.02 cm AV Area (Vmean):   1.93 cm AV Area (VTI):     1.88 cm AV Vmax:           158.00 cm/s AV Vmean:          110.500 cm/s AV VTI:            0.318 m AV Peak Grad:      10.0 mmHg AV Mean Grad:      5.5 mmHg LVOT Vmax:         125.50 cm/s LVOT Vmean:        83.800 cm/s LVOT VTI:          0.235 m LVOT/AV VTI ratio: 0.74  AORTA Ao Root diam: 2.80 cm Ao Asc diam:  3.30 cm MITRAL VALVE                TRICUSPID VALVE MV Area (PHT): 4.26 cm     TR Peak grad:   52.4 mmHg MV Area VTI:   1.77 cm     TR Mean grad:   35.0 mmHg MV Peak grad:  11.4 mmHg    TR Vmax:        362.00 cm/s MV Mean grad:  4.0 mmHg     TR Vmean:       283.0 cm/s MV Vmax:       1.69 m/s MV Vmean:      94.1 cm/s    SHUNTS MV Decel Time: 178 msec     Systemic VTI:  0.24 m MR Peak  grad: 142.1 mmHg     Systemic Diam: 1.80 cm MR Mean grad: 97.0 mmHg MR Vmax:      596.00 cm/s MR Vmean:     468.0 cm/s MV E velocity: 163.00 cm/s Weston Brass MD Electronically signed by Weston Brass MD Signature Date/Time: 10/03/2022/1:44:29 PM    Final    VAS US CAROTID  Result Date: 10/03/2022 Carotid Arterial Duplex Study Patient Name:  Linda Glass  Date of Exam:   10/03/2022 Medical Rec #: [id number]                    Accession #:    [id number] Date of Birth: 1933/11/13                    Patient Gender: F Patient Age:   27 years Exam Location:  [facility] Procedure:      VAS US CAROTID Referring Phys: DAVID ORTIZ --------------------------------------------------------------------------------  Indications:       Syncope. Risk Factors:      Hypertension, hyperlipidemia. Comparison Study:  No prior studies. Performing Technologist: Chanda Busing RVT  Examination Guidelines: A complete evaluation includes B-mode imaging, spectral Doppler, color Doppler, and power Doppler as needed of all accessible portions of each vessel. Bilateral testing is considered an integral part of a complete examination. Limited examinations for reoccurring indications may be performed as noted.  Right Carotid Findings: +----------+--------+--------+--------+-----------------------+--------+           PSV cm/sEDV cm/sStenosisPlaque Description     Comments +----------+--------+--------+--------+-----------------------+--------+ CCA Prox  67      9               smooth and heterogenous         +----------+--------+--------+--------+-----------------------+--------+ CCA Distal86      9               smooth and heterogenous         +----------+--------+--------+--------+-----------------------+--------+ ICA Prox  69      10              smooth and heterogenous         +----------+--------+--------+--------+-----------------------+--------+ ICA Mid   89      24              smooth and  heterogenous         +----------+--------+--------+--------+-----------------------+--------+ ICA Distal71      15                                     tortuous +----------+--------+--------+--------+-----------------------+--------+ ECA       116     13                                              +----------+--------+--------+--------+-----------------------+--------+ +----------+--------+-------+--------+-------------------+           PSV cm/sEDV cmsDescribeArm Pressure (mmHG) +----------+--------+-------+--------+-------------------+ [id number]                                        +----------+--------+-------+--------+-------------------+ +---------+--------+--+--------+--+---------+ VertebralPSV cm/s63EDV cm/s14Antegrade +---------+--------+--+--------+--+---------+  Left Carotid Findings: +----------+--------+--------+--------+-----------------------+--------+           PSV cm/sEDV cm/sStenosisPlaque Description     Comments +----------+--------+--------+--------+-----------------------+--------+ CCA Prox  94      13              smooth and heterogenous         +----------+--------+--------+--------+-----------------------+--------+ CCA Distal63      9               smooth and heterogenous         +----------+--------+--------+--------+-----------------------+--------+ ICA Prox  77      9               smooth and heterogenous         +----------+--------+--------+--------+-----------------------+--------+ ICA Mid   74      17              smooth and heterogenoustortuous +----------+--------+--------+--------+-----------------------+--------+ ICA Distal67      16                                     tortuous +----------+--------+--------+--------+-----------------------+--------+ ECA       94      9                                               +----------+--------+--------+--------+-----------------------+--------+  +----------+--------+--------+--------+-------------------+           PSV cm/sEDV cm/sDescribeArm Pressure (mmHG) +----------+--------+--------+--------+-------------------+ Subclavian190                                         +----------+--------+--------+--------+-------------------+ +---------+--------+--+--------+-+---------+ VertebralPSV cm/s58EDV cm/s8Antegrade +---------+--------+--+--------+-+---------+   Summary: Right Carotid: Velocities in the right ICA are consistent with a 1-39% stenosis. Left Carotid: Velocities in the left ICA are consistent with a 1-39% stenosis. Vertebrals: Bilateral vertebral arteries demonstrate antegrade flow. *See table(s) above for measurements and observations.  Electronically signed by Coral Else MD on 10/03/2022 at 10:00:38 AM.    Final    CT Head Wo Contrast  Result Date: 10/02/2022 CLINICAL DATA:  Head trauma, minor (Age >= 65y). EXAM: CT HEAD WITHOUT CONTRAST TECHNIQUE: Contiguous axial images were obtained from the base of the skull through the vertex without intravenous contrast. RADIATION DOSE REDUCTION: This exam was performed according to the departmental dose-optimization program which includes automated exposure control, adjustment of the mA and/or kV according to patient size and/or use of iterative reconstruction technique. COMPARISON:  None Available. FINDINGS: Brain: No acute hemorrhage. Mild patchy hypoattenuation of the cerebral white matter, most consistent with mild chronic small-vessel disease. Age-appropriate volume loss. No hydrocephalus or extra-axial collection. No mass effect or midline shift. Vascular: No hyperdense vessel or unexpected calcification. Skull: No calvarial fracture or suspicious bone lesion. Skull base is unremarkable. Sinuses/Orbits: No acute finding. Other: None. IMPRESSION: 1. No evidence of acute intracranial injury. 2. Mild chronic small-vessel disease and volume loss. Electronically Signed   By: Orvan Falconer M.D.   On: 10/02/2022 11:05   DG Hand Complete Left  Result Date: 10/02/2022 CLINICAL DATA:  Possible syncope with fall resulting in left ring finger pain and bruising EXAM: LEFT HAND - COMPLETE 3 VIEW COMPARISON:  Left hand radiograph dated 05/10/2020 FINDINGS: There is no evidence of fracture or dislocation. Similar degenerative changes of the hand. Soft tissues are unremarkable. IMPRESSION: 1. No acute displaced fracture or dislocation. 2. Similar  degenerative changes of the hand. Electronically Signed   By: Agustin Cree M.D.   On: 10/02/2022 10:27   DG Chest 1 View  Result Date: 10/02/2022 CLINICAL DATA:  Pain EXAM: CHEST  1 VIEW COMPARISON:  X-ray 05/02/2020 FINDINGS: Mildly enlarged cardiopericardial silhouette. Slight elevation left hemidiaphragm. No consolidation, pneumothorax or effusion. No edema. Calcified aorta. Known hiatal hernia. Sclerotic area along the proximal right humerus. This is of uncertain etiology but could be a chondroid lesion. Please correlate for any prior imaging or history otherwise additional shoulder x-ray can be performed when clinically appropriate IMPRESSION: Enlarged heart.  Hiatal hernia.  No consolidation or edema. Sclerotic area along the proximal right humerus. A chondroid lesion is possible. However there is differential. Please correlate with any prior or history otherwise follow up shoulder x-rays can be performed when clinically appropriate Electronically Signed   By: Karen Kays M.D.   On: 10/02/2022 10:27    Microbiology: Results for orders placed or performed during the hospital encounter of 10/02/22  SARS Coronavirus 2 by RT PCR (hospital order, performed in [facility] hospital lab) *cepheid single result test* Anterior Nasal Swab     Status: None   Collection Time: 10/02/22  9:45 AM   Specimen: Anterior Nasal Swab  Result Value Ref Range Status   SARS Coronavirus 2 by RT PCR NEGATIVE NEGATIVE Final    Comment: (NOTE) SARS-CoV-2 target nucleic  acids are NOT DETECTED.  The SARS-CoV-2 RNA is generally detectable in upper and lower respiratory specimens during the acute phase of infection. The lowest concentration of SARS-CoV-2 viral copies this assay can detect is 250 copies / mL. A negative result does not preclude SARS-CoV-2 infection and should not be used as the sole basis for treatment or other patient management decisions.  A negative result may occur with improper specimen collection / handling, submission of specimen other than nasopharyngeal swab, presence of viral mutation(s) within the areas targeted by this assay, and inadequate number of viral copies (<250 copies / mL). A negative result must be combined with clinical observations, patient history, and epidemiological information.  Fact Sheet for Patients:   RoadLapTop.co.za  Fact Sheet for Healthcare Providers: http://kim-miller.com/  This test is not yet approved or  cleared by the [place] FDA and has been authorized for detection and/or diagnosis of SARS-CoV-2 by FDA under an Emergency Use Authorization (EUA).  This EUA will remain in effect (meaning this test can be used) for the duration of the COVID-19 declaration under Section 564(b)(1) of the Act, 21 U.S.C. section 360bbb-3(b)(1), unless the authorization is terminated or revoked sooner.  Performed at [facility], [address], [place], [place] [address]   Urine Culture     Status: Abnormal   Collection Time: 10/02/22 12:04 PM   Specimen: Urine, Random  Result Value Ref Range Status   Specimen Description   Final    URINE, RANDOM Performed at [facility], [address], [place], [place] [address]    Special Requests   Final    NONE Reflexed from [id number] Performed at [facility], [address] [place], [place] [address]    Culture >=100,000 COLONIES/mL ESCHERICHIA COLI (A)  Final    Report Status 10/04/2022 FINAL  Final   Organism ID, Bacteria ESCHERICHIA COLI (A)  Final      Susceptibility   Escherichia coli - MIC*    AMPICILLIN <=2 SENSITIVE Sensitive     CEFAZOLIN <=4 SENSITIVE Sensitive  CEFEPIME <=0.12 SENSITIVE Sensitive     CEFTRIAXONE <=0.25 SENSITIVE Sensitive     CIPROFLOXACIN <=0.25 SENSITIVE Sensitive     GENTAMICIN <=1 SENSITIVE Sensitive     IMIPENEM <=0.25 SENSITIVE Sensitive     NITROFURANTOIN <=16 SENSITIVE Sensitive     TRIMETH/SULFA <=20 SENSITIVE Sensitive     AMPICILLIN/SULBACTAM <=2 SENSITIVE Sensitive     PIP/TAZO <=4 SENSITIVE Sensitive     * >=100,000 COLONIES/mL ESCHERICHIA COLI   Labs: CBC: Recent Labs  Lab 10/02/22 1125 10/03/22 0430  WBC 10.8* 6.7  NEUTROABS 9.3*  --   HGB 13.6 12.8  HCT 41.5 38.1  MCV 105.6* 106.7*  PLT 145* 129*   Basic Metabolic Panel: Recent Labs  Lab 10/02/22 1125 10/03/22 0430  NA 138 135  K 3.9 3.6  CL 105 101  CO2 21* 21*  GLUCOSE 97 106*  BUN 16 11  CREATININE 0.46 0.50  CALCIUM 9.5 9.5   Liver Function Tests: Recent Labs  Lab 10/02/22 1125 10/03/22 0430  AST 34 34  ALT 22 21  ALKPHOS 118 101  BILITOT 1.1 2.0*  PROT 7.5 6.6  ALBUMIN 4.1 3.5   CBG: Recent Labs  Lab 10/03/22 0519 10/04/22 0509  GLUCAP 106* 114*    Discharge time spent: greater than 30 minutes.  Author: Lynden Oxford, MD  [facility] 10/04/2022

## 2022-10-07 ENCOUNTER — Other Ambulatory Visit: Payer: Self-pay | Admitting: Family Medicine

## 2022-10-07 ENCOUNTER — Telehealth: Payer: Self-pay

## 2022-10-07 DIAGNOSIS — R000 Tachycardia, unspecified: Secondary | ICD-10-CM

## 2022-10-07 DIAGNOSIS — I10 Essential (primary) hypertension: Secondary | ICD-10-CM

## 2022-10-07 NOTE — Transitions of Care (Post Inpatient/ED Visit) (Unsigned)
   10/07/2022  Name: Linda Glass MRN: [id number] DOB: 1933-08-30  Today's TOC FU Call Status: Today's TOC FU Call Status:: Unsuccessful Call (1st Attempt) Unsuccessful Call (1st Attempt) Date: 10/07/22  Attempted to reach the patient regarding the most recent Inpatient/ED visit.  Follow Up Plan: Additional outreach attempts will be made to reach the patient to complete the Transitions of Care (Post Inpatient/ED visit) call.   Signature   Woodfin Ganja LPN [facility] Nurse Health Advisor Direct Dial [phone]

## 2022-10-08 NOTE — Transitions of Care (Post Inpatient/ED Visit) (Signed)
10/08/2022  Name: Linda Glass MRN: [id number] DOB: 05-30-1933  Today's TOC FU Call Status: Today's TOC FU Call Status:: Successful TOC FU Call Completed Unsuccessful Call (1st Attempt) Date: 10/07/22 [facility] FU Call Complete Date: 10/08/22  Transition Care Management Follow-up Telephone Call Date of Discharge: 10/04/22 Discharge Facility: [facility]) Type of Discharge: Inpatient Admission Primary Inpatient Discharge Diagnosis:: Fall How have you been since you were released from the hospital?: Better Any questions or concerns?: No  Items Reviewed: Did you receive and understand the discharge instructions provided?: Yes Medications obtained,verified, and reconciled?: Yes (Medications Reviewed) Any new allergies since your discharge?: No Dietary orders reviewed?: Yes Do you have support at home?: Yes  Medications Reviewed Today: Medications Reviewed Today     Reviewed by Merleen Nicely, LPN (Licensed Practical Nurse) on 10/08/22 at 1300  Med List Status: <None>   Medication Order Taking? Sig Documenting Provider Last Dose Status Informant  acetaminophen (TYLENOL) 325 MG tablet [id number] Yes Take 2 tablets (650 mg total) by mouth every 6 (six) hours as needed for mild pain (or Fever >/= 101). Marguerita Merles [place], DO Taking Active Self, Child, Pharmacy Records  amLODipine (NORVASC) 10 MG tablet [id number] Yes Take 1 tablet (10 mg total) by mouth daily. Rolly Salter, MD Taking Active   atorvastatin (LIPITOR) 40 MG tablet [id number] Yes TAKE 1 TABLET EACH DAY.  Patient taking differently: Take 40 mg by mouth daily.   [name], [name], MD Taking Active Self, Child, Pharmacy Records  citalopram (CELEXA) 20 MG tablet [id number] Yes Take 1 tablet (20 mg total) by mouth daily. [name], [name], MD Taking Active Self, Child, Pharmacy Records  Cyanocobalamin (VITAMIN B 12 PO) [id number] Yes Take 500 mcg by mouth daily. [provider] Taking Active Self, Child,  Pharmacy Records  ELIQUIS 5 MG TABS tablet [id number] Yes TAKE ONE TABLET BY MOUTH TWICE DAILY  Patient taking differently: Take 5 mg by mouth 2 (two) times daily.   [name], [name], MD Taking Active Self, Child, Pharmacy Records  Ferrous Gluconate 324 (37.5 Fe) MG TABS [id number] Yes Take 324 mg by mouth every other day. [provider] Taking Active Self, Child, Pharmacy Records  fluticasone (FLONASE) 50 MCG/ACT nasal spray [id number] Yes Place 2 sprays into both nostrils daily. Karie Georges, MD Taking Active Self, Child, Pharmacy Records  furosemide (LASIX) 80 MG tablet [id number] Yes Take 1 tablet (80 mg total) by mouth daily. Chilton Si, MD Taking Active Self, Child, Pharmacy Records  LORazepam (ATIVAN) 0.5 MG tablet [id number] Yes Take 0.5-1 tablets (0.25-0.5 mg total) by mouth at bedtime as needed for anxiety. TAKE 1/2 TABLET AT BEDTIME AS NEEDED FOR ANXIETY  Patient taking differently: Take 0.25-0.5 mg by mouth at bedtime as needed for anxiety.   [name], [name], MD Taking Active Self, Child, Pharmacy Records  magnesium oxide (MAG-OX) 400 (241.3 Mg) MG tablet [id number] Yes Take 0.5 tablets (200 mg total) by mouth daily.  Patient taking differently: Take 400 mg by mouth daily.   [place], Alvira Philips, DO Taking Active Self, Child, Pharmacy Records  metoprolol succinate (TOPROL-XL) 25 MG 24 hr tablet [id number] Yes Take 1 tablet (25 mg total) by mouth daily. [name], [name], MD Taking Active   Multiple Vitamin (MULTIVITAMIN WITH MINERALS) TABS tablet [id number] Yes Take 1 tablet by mouth daily. [provider] Taking Active Self, Child, Pharmacy Records  ondansetron (ZOFRAN) 4 MG tablet [id number] Yes Take 1 tablet (4 mg total) by mouth every 8 (eight)  hours as needed for nausea or vomiting. Karie Georges, MD Taking Active Self, Child, Pharmacy Records  pantoprazole (PROTONIX) 40 MG tablet [id number] Yes Take 1 tablet (40 mg total) by mouth daily. [name], [name], MD Taking  Active Self, Child, Pharmacy Records  saccharomyces boulardii [facility]) 250 MG capsule [id number] Yes Take 1 capsule (250 mg total) by mouth 2 (two) times daily for 7 days. Rolly Salter, MD Taking Active   spironolactone (ALDACTONE) 25 MG tablet [id number] Yes Take 1 tablet (25 mg total) by mouth daily. Rolly Salter, MD Taking Active             Home Care and Equipment/Supplies: Were Home Health Services Ordered?: No Any new equipment or medical supplies ordered?: No  Functional Questionnaire: Do you need assistance with bathing/showering or dressing?: Yes Do you need assistance with meal preparation?: Yes Do you need assistance with eating?: No Do you have difficulty maintaining continence: No Do you need assistance with getting out of bed/getting out of a chair/moving?: Yes Do you have difficulty managing or taking your medications?: Yes (daughter assists)  Follow up appointments reviewed: PCP Follow-up appointment confirmed?: No (pt needs appt next week between 8-19 thru 8-22- no open appts- will ask front desk to call dtr to schedule fu appt) MD Provider Line Number:[phone] Given: Yes Specialist Hospital Follow-up appointment confirmed?: Yes Date of Specialist follow-up appointment?: 10/11/22 Follow-Up Specialty Provider:: cardiologist Do you need transportation to your follow-up appointment?: No Do you understand care options if your condition(s) worsen?: Yes-patient verbalized understanding    SIGNATURE  Woodfin Ganja LPN [facility] Nurse Health Advisor Direct Dial [phone]

## 2022-10-11 ENCOUNTER — Encounter (HOSPITAL_BASED_OUTPATIENT_CLINIC_OR_DEPARTMENT_OTHER): Payer: Self-pay | Admitting: Cardiology

## 2022-10-11 ENCOUNTER — Ambulatory Visit (HOSPITAL_BASED_OUTPATIENT_CLINIC_OR_DEPARTMENT_OTHER): Payer: Medicare Other | Admitting: Cardiology

## 2022-10-11 VITALS — BP 138/90 | HR 65 | Ht 61.0 in | Wt 156.0 lb

## 2022-10-11 DIAGNOSIS — Z09 Encounter for follow-up examination after completed treatment for conditions other than malignant neoplasm: Secondary | ICD-10-CM

## 2022-10-11 DIAGNOSIS — I10 Essential (primary) hypertension: Secondary | ICD-10-CM

## 2022-10-11 DIAGNOSIS — I4821 Permanent atrial fibrillation: Secondary | ICD-10-CM

## 2022-10-11 DIAGNOSIS — R55 Syncope and collapse: Secondary | ICD-10-CM

## 2022-10-11 NOTE — Progress Notes (Signed)
Cardiology Office Note:  .    Date:  10/11/2022  ID:  [name], DOB [birth year], MRN [id number] PCP: [name], [name], MD  [facility] Providers Cardiologist:  Chilton Si, MD     History of Present Illness: .    Linda Glass is a 89 y.o. female with a hx of persistent atrial fibrillation, hypertension, hyperlipidemia, chronic diastolic heart failure, incomplete right bundle branch block, GI bleed, here for the evaluation of syncope. Previously seen by Dr. Duke Salvia 12/2020. Last seen in cardiology by Gillian Shields, NP 09/2021.  She presented to the hospital 10/02/2022 and was admitted following an unwitnessed fall secondary to suspected syncope. She had been unable to get up until her neighbor found her. Orthostatics were negative at the time of presentation. Echocardiogram showed preserved EF. Carotid doppler unremarkable. Incidentally found to have E. coli UTI treated with IV ceftriaxone. Recommended for outpatient Zio monitor.  Today she is accompanied by her daughter who assists with the history. She is hard of hearing. We reviewed her hospitalization, course, and mediations today.  Syncope: Initial episode: On 10/02/2022 she had fallen without warning/prodrome.  Frequency: Isolated episode. No other similar episodes. Presyncopal symptoms: None. No prodrome. Post syncope symptoms:  None. Aggravating/alleviating factors: Was found to have incidental E. coli UTI in the hospital. She has nearly completed her antibiotic course. Pre-existing medical conditions: Admitted 04/2020 with new onset atrial fibrillation in the setting of GI bleed, now persistent Afib rate controlled on metoprolol. Chronic diastolic heart failure. Hypertension. Hyperlipidemia - In the office today her blood pressure is 138/90. Her blood pressure had been consisently high. During her recent admission, amlodipine was increased to 10 mg. She has not noticed any severe leg swelling at  this time. She had been advised to start wearing compression socks which she does. Potential medication/supplement interactions: Complains of some anxiety at times. At night she takes a very low dose of ativan. Prior workup: As above. She recently completed her monitor; final results are pending. ECG: During visit 09/2021 with Gillian Shields, NP, demonstrated atrial fibrillation at 69 bpm with incomplete RBBB. Exercise level: Typically she is independent and cares for herself well.  She denies any palpitations, chest pain, shortness of breath, lightheadedness, headaches, syncope, orthopnea, or PND.  ROS:  Please see the history of present illness. ROS otherwise negative except as noted.  (+) Isolated syncope (+) Anxiety  Studies Reviewed: .         Echocardiogram  10/03/2022:  1. Left ventricular ejection fraction, by estimation, is 65 to 70%. The  left ventricle has normal function. The left ventricle has no regional  wall motion abnormalities. There is mild asymmetric left ventricular  hypertrophy of the basal-septal segment.  Left ventricular diastolic parameters are indeterminate.   2. Right ventricular systolic function is normal. The right ventricular  size is mildly enlarged.   3. Left atrial size was severely dilated.   4. Right atrial size was severely dilated.   5. The mitral valve is degenerative. Mild to moderate mitral valve  regurgitation. Mitral valve regurgitation is anteriorly directed and  eccentric, suspect posterior leaflet prolapse and primary mitral valve  regurgitation.   6. Tricuspid valve regurgitation is moderate.   7. The aortic valve is tricuspid. There is mild calcification of the  aortic valve. There is mild thickening of the aortic valve. Aortic valve  regurgitation is not visualized. Aortic valve sclerosis/calcification is  present, without any evidence of  aortic stenosis.   8. IVC  not visualized.   Carotid Dopplers  10/03/2022: Summary:  Right  Carotid: Velocities in the right ICA are consistent with a 1-39%  stenosis.   Left Carotid: Velocities in the left ICA are consistent with a 1-39%  stenosis.   Vertebrals: Bilateral vertebral arteries demonstrate antegrade flow.   Physical Exam:    VS:  BP (!) 138/90 (BP Location: Right Arm, Patient Position: Sitting, Cuff Size: Normal)   Pulse 65   Ht 5\' 1"  (1.549 m)   Wt 156 lb (70.8 kg)   SpO2 96%   BMI 29.48 kg/m    Wt Readings from Last 3 Encounters:  10/11/22 156 lb (70.8 kg)  10/04/22 159 lb 2.8 oz (72.2 kg)  08/21/22 143 lb (64.9 kg)    GEN: Well nourished, well developed in no acute distress HEENT: Normal, moist mucous membranes NECK: No JVD CARDIAC: Irregularly irregular rhythm, normal S1 and S2, no rubs or gallops. 2/6 systolic murmur. VASCULAR: Radial and DP pulses 2+ bilaterally. No carotid bruits RESPIRATORY:  Clear to auscultation without rales, wheezing or rhonchi  ABDOMEN: Soft, non-tender, non-distended MUSCULOSKELETAL:  Ambulates independently SKIN: Warm and dry, no edema NEUROLOGIC:  Alert and oriented x 3. No focal neuro deficits noted. PSYCHIATRIC:  Normal affect   ASSESSMENT AND PLAN: .    Hospital follow up Syncope -in the setting of UTI.  -Echo without structural etiology -just returned monitor, awaiting results -we discussed red flag signs. She is feeling much better now -we will contact her with the results of the monitor. She will contact us with any recurrent events.  Dispo: Follow-up in 6 months, or sooner as needed.  I,Mathew Stumpf,acting as a Neurosurgeon for [employer], MD.,have documented all relevant documentation on the behalf of Jodelle Red, MD,as directed by  Jodelle Red, MD while in the presence of Jodelle Red, MD.  I, Jodelle Red, MD, have reviewed all documentation for this visit. The documentation on 10/11/22 for the exam, diagnosis, procedures, and orders are all accurate and  complete.   Signed, Jodelle Red, MD

## 2022-10-11 NOTE — Patient Instructions (Addendum)
Medication Instructions:  Continue current medications  *If you need a refill on your cardiac medications before your next appointment, please call your pharmacy*   Lab Work: None Ordered   Testing/Procedures: None Ordered   Follow-Up: At [facility], you and your health needs are our priority.  As part of our continuing mission to provide you with exceptional heart care, we have created designated Provider Care Teams.  These Care Teams include your primary Cardiologist (physician) and Advanced Practice Providers (APPs -  Physician Assistants and Nurse Practitioners) who all work together to provide you with the care you need, when you need it.  We recommend signing up for the patient portal called "MyChart".  Sign up information is provided on this After Visit Summary.  MyChart is used to connect with patients for Virtual Visits (Telemedicine).  Patients are able to view lab/test results, encounter notes, upcoming appointments, etc.  Non-urgent messages can be sent to your provider as well.   To learn more about what you can do with MyChart, go to [email or web address].    Your next appointment:   6 month(s)  Provider:   Chilton Si, MD  Other Instructions

## 2022-10-21 ENCOUNTER — Other Ambulatory Visit (HOSPITAL_BASED_OUTPATIENT_CLINIC_OR_DEPARTMENT_OTHER): Payer: Self-pay | Admitting: Cardiovascular Disease

## 2022-10-21 NOTE — Progress Notes (Unsigned)
HPI: Linda Glass is a 89 y.o. female with PMHx significant for hyperlipidemia, hearing loss,hypertension, atrial fibrillation on chronic anticoagulation, diastolic dysfunction, and aortic atherosclerosis here today with her neighbor and friend to follow on recent hospitalization. She was admitted on 10/02/2022 and discharged home on 10/04/2022. TOC call on 10/07/22.  She presented to the ER after an unwitnessed fall, inability to get up, syncope suspected.  Her neighbor found her on the floor. Urine culture positive for E. coli UTI.  Treated with IV ceftriaxone, she was changed to Cefadroxil 500 mg bid to complete 3 days. She has completed imaging. She denies dysuria, gross hematuria, foamy urine, or changes in urinary frequency.  Also reported having diarrhea, causing AKI. States that diarrhea has resolved. She is having 2 soft bowel movements per day, reported as her normal bowel habits.  Hypertension: Amlodipine dose was increased from 5 mg to 10 mg daily. Home BP readings range from 120-150/74 mmHg. Atrial fibrillation: She is currently on Eliquis 5 mg twice daily and metoprolol succinate 50 mg daily. . Evaluated by cardiologist on 10/11/2022. Component     Latest Ref Rng 10/03/2022  Glucose     70 - 99 mg/dL 829 (H)   BUN     6 - 23 mg/dL 11   Creatinine     5.62 - 1.20 mg/dL 1.30   Sodium     865 - 145 mEq/L 135   Potassium     3.5 - 5.1 mEq/L 3.6   Chloride     96 - 112 mEq/L 101   CO2     19 - 32 mEq/L 21 (L)   Calcium     8.4 - 10.5 mg/dL 9.5   Total Protein     6.5 - 8.1 g/dL 6.6   Total Bilirubin     0.3 - 1.2 mg/dL 2.0 (H)   AST     15 - 41 U/L 34   ALT     0 - 44 U/L 21   GFR, Estimated     >60 mL/min >60   Anion gap     5 - 15  13   Albumin     3.5 - 5.0 g/dL 3.5   Alkaline Phosphatase     38 - 126 U/L 101     Mild thrombocytopenia. She has not noted more bruising than usual, nose/gum bleeding, blood in the stool, or  melena.  Component     Latest Ref Rng 10/03/2022  WBC     4.0 - 10.5 K/uL 6.7   RBC     3.87 - 5.11 Mil/uL 3.57 (L)   Hemoglobin     12.0 - 15.0 g/dL 78.4   HCT     69.6 - 29.5 % 38.1   MCV     78.0 - 100.0 fl 106.7 (H)   MCHC     30.0 - 36.0 g/dL 28.4   RDW     13.2 - 44.0 % 13.3   Platelets     150.0 - 400.0 K/uL 129 (L)    She is using a walker. Has a shower sit. She also has a bedside commode, which she does not use. She gets up about twice at night to use the bathroom. PT was not arrange. Lives alone, uses an alarm system in case of falls.  Her daughter calls her a few times throughout the day and her neighbor checks on her if needed.  Anxiety and insomnia: Currently she is on lorazepam  0.25-0.5 mg daily at bedtime to help with sleep and citalopram 20 mg daily. She reports no symptoms of anxiety or depression, no side effects reported.  Review of Systems  Constitutional:  Negative for chills and fever.  HENT:  Negative for sore throat and trouble swallowing.   Eyes:  Negative for redness and visual disturbance.  Respiratory:  Negative for cough and wheezing.   Cardiovascular:  Negative for chest pain and palpitations.  Gastrointestinal:  Negative for abdominal pain, nausea and vomiting.  Endocrine: Negative for cold intolerance and heat intolerance.  Musculoskeletal:  Positive for arthralgias and gait problem.  Skin:  Negative for rash.  Neurological:  Negative for dizziness, syncope, facial asymmetry, weakness and headaches.  Psychiatric/Behavioral:  Negative for confusion and hallucinations.   See other pertinent positives and negatives in HPI.  Current Outpatient Medications on File Prior to Visit  Medication Sig Dispense Refill   acetaminophen (TYLENOL) 325 MG tablet Take 2 tablets (650 mg total) by mouth every 6 (six) hours as needed for mild pain (or Fever >/= 101).     amLODipine (NORVASC) 10 MG tablet Take 1 tablet (10 mg total) by mouth daily. 30 tablet 0    atorvastatin (LIPITOR) 40 MG tablet TAKE 1 TABLET EACH DAY. (Patient taking differently: Take 40 mg by mouth daily.) 90 tablet 2   citalopram (CELEXA) 20 MG tablet Take 1 tablet (20 mg total) by mouth daily. 90 tablet 2   Cyanocobalamin (VITAMIN B 12 PO) Take 500 mcg by mouth daily.     ELIQUIS 5 MG TABS tablet TAKE ONE TABLET BY MOUTH TWICE DAILY (Patient taking differently: Take 5 mg by mouth 2 (two) times daily.) 60 tablet 3   Ferrous Gluconate 324 (37.5 Fe) MG TABS Take 324 mg by mouth every other day.     fluticasone (FLONASE) 50 MCG/ACT nasal spray Place 2 sprays into both nostrils daily. 16 g 2   furosemide (LASIX) 80 MG tablet TAKE ONE TABLET BY MOUTH DAILY 90 tablet 3   LORazepam (ATIVAN) 0.5 MG tablet Take 0.5-1 tablets (0.25-0.5 mg total) by mouth at bedtime as needed for anxiety. TAKE 1/2 TABLET AT BEDTIME AS NEEDED FOR ANXIETY (Patient taking differently: Take 0.25-0.5 mg by mouth at bedtime as needed for anxiety.) 30 tablet 3   magnesium oxide (MAG-OX) 400 (241.3 Mg) MG tablet Take 0.5 tablets (200 mg total) by mouth daily. (Patient taking differently: Take 400 mg by mouth daily.)     metoprolol succinate (TOPROL-XL) 25 MG 24 hr tablet Take 1 tablet (25 mg total) by mouth daily. 90 tablet 1   Multiple Vitamin (MULTIVITAMIN WITH MINERALS) TABS tablet Take 1 tablet by mouth daily.     ondansetron (ZOFRAN) 4 MG tablet Take 1 tablet (4 mg total) by mouth every 8 (eight) hours as needed for nausea or vomiting. 20 tablet 0   pantoprazole (PROTONIX) 40 MG tablet Take 1 tablet (40 mg total) by mouth daily. 90 tablet 2   spironolactone (ALDACTONE) 25 MG tablet Take 1 tablet (25 mg total) by mouth daily. 90 tablet 3   No current facility-administered medications on file prior to visit.    Past Medical History:  Diagnosis Date   Anemia 04/2020   Anxiety    Arthritis    Chicken pox    COVID-19    Heart murmur    Hyperlipidemia    Hypertension    Allergies  Allergen Reactions    Losartan Other (See Comments)    diarrhea  Social History   Socioeconomic History   Marital status: Widowed    Spouse name: Not on file   Number of children: 2   Years of education: Not on file   Highest education level: Not on file  Occupational History   Not on file  Tobacco Use   Smoking status: Never   Smokeless tobacco: Never  Vaping Use   Vaping status: Never Used  Substance and Sexual Activity   Alcohol use: Not Currently    Alcohol/week: 7.0 standard drinks of alcohol    Types: 7 Glasses of wine per week    Comment: Occasional   Drug use: No   Sexual activity: Not Currently  Other Topics Concern   Not on file  Social History Narrative   Not on file   Social Determinants of Health   Financial Resource Strain: Low Risk  (08/21/2022)   Overall Financial Resource Strain (CARDIA)    Difficulty of Paying Living Expenses: Not hard at all  Food Insecurity: No Food Insecurity (10/03/2022)   Hunger Vital Sign    Worried About Running Out of Food in the Last Year: Never true    Ran Out of Food in the Last Year: Never true  Transportation Needs: No Transportation Needs (10/03/2022)   PRAPARE - Administrator, Civil Service (Medical): No    Lack of Transportation (Non-Medical): No  Physical Activity: Insufficiently Active (08/21/2022)   Exercise Vital Sign    Days of Exercise per Week: 2 days    Minutes of Exercise per Session: 20 min  Stress: No Stress Concern Present (08/21/2022)   [employer] of Occupational Health - Occupational Stress Questionnaire    Feeling of Stress : Not at all  Social Connections: Socially Isolated (08/21/2022)   Social Connection and Isolation Panel [NHANES]    Frequency of Communication with Friends and Family: More than three times a week    Frequency of Social Gatherings with Friends and Family: More than three times a week    Attends Religious Services: Never    Database administrator or Organizations: No    Attends Occupational hygienist Meetings: Never    Marital Status: Widowed    Vitals:   10/22/22 1017  BP: 128/80  Pulse: 76  Resp: 16  Temp: 97.8 F (36.6 C)  SpO2: 97%   Body mass index is 30.26 kg/m.  Physical Exam Vitals and nursing note reviewed.  Constitutional:      General: She is not in acute distress.    Appearance: She is well-developed.  HENT:     Head: Normocephalic and atraumatic.     Mouth/Throat:     Mouth: Mucous membranes are moist.     Pharynx: Oropharynx is clear.  Eyes:     General:        Right eye: Discharge present.     Conjunctiva/sclera: Conjunctivae normal.  Cardiovascular:     Rate and Rhythm: Normal rate. Rhythm irregular.     Heart sounds: No murmur heard.    Comments: Trace pitting edema LE, bilateral. DP pulses palpable. Pulmonary:     Effort: Pulmonary effort is normal. No respiratory distress.     Breath sounds: Normal breath sounds.  Abdominal:     Palpations: Abdomen is soft. There is no hepatomegaly or mass.     Tenderness: There is no abdominal tenderness.  Skin:    General: Skin is warm.     Findings: No erythema or rash.  Neurological:  General: No focal deficit present.     Mental Status: She is alert and oriented to person, place, and time.     Cranial Nerves: No cranial nerve deficit.     Comments: Unstable gait assisted with a walker.  Psychiatric:        Mood and Affect: Mood and affect normal.   ASSESSMENT AND PLAN:  Ms. Arshdeep was seen today for hospitalization follow-up.  Diagnoses and all orders for this visit: Lab Results  Component Value Date   NA 138 10/22/2022   CL 101 10/22/2022   K 4.1 10/22/2022   CO2 25 10/22/2022   BUN 17 10/22/2022   CREATININE 0.67 10/22/2022   GFR 78.71 10/22/2022   CALCIUM 9.8 10/22/2022   PHOS 2.7 09/21/2021   ALBUMIN 3.5 10/03/2022   GLUCOSE 107 (H) 10/22/2022   Lab Results  Component Value Date   WBC 6.9 10/22/2022   HGB 13.7 10/22/2022   HCT 42.2 10/22/2022   MCV 106.3  (H) 10/22/2022   PLT 199.0 10/22/2022   Fall, subsequent encounter Assessment & Plan: Fall precautions discussed. PT through home health will be arranged.  Orders: -     Ambulatory referral to Home Health  Hypercoagulable state [facility]) Assessment & Plan: On chronic anticoagulation, Eliquis, due to atrial fibrillation.   Hypertension, essential, benign Assessment & Plan: BP adequately controlled. Continue amlodipine 10 mg daily and metoprolol succinate 25 mg daily as well as low-salt diet. Recommend monitoring BP at home.  Orders: -     Basic metabolic panel; Future -     CBC; Future  Anxiety disorder, unspecified type Assessment & Plan: She reports problem is well-controlled with current regimen. Continue citalopram 20 mg daily and lorazepam 0.5 mg 1/2 tablet to 1 tablet daily as needed. Follow-up in 6 months, before if needed.   Chronic diastolic heart failure [facility]) Assessment & Plan: Following with cardiologist. Last echo on 10/03/2022 showed LVEF 65 to 70%.  Left ventricular diastolic parameters indeterminate.   Thrombocytopenia (HCC) Assessment & Plan: Mild. Instructed about warning signs. Further recommendation will be given according to CBC result.  Orders: -     CBC; Future  Urinary tract infection without hematuria, site unspecified Assessment & Plan: Completed Cefadroxil treatment. Asymptomatic. No further work up needed at this time. Continue adequate hydration.   I spent a total of 43 minutes in both face to face and non face to face activities for this visit on the date of this encounter. During this time history was obtained and documented, examination was performed, prior labs/imaging reviewed, and assessment/plan discussed.  Return in about 5 months (around 03/24/2023) for chronic problems.  Add Dinapoli G. [name], MD  [facility]. [place] office.

## 2022-10-22 ENCOUNTER — Ambulatory Visit (INDEPENDENT_AMBULATORY_CARE_PROVIDER_SITE_OTHER): Payer: Medicare Other | Admitting: Family Medicine

## 2022-10-22 ENCOUNTER — Encounter: Payer: Self-pay | Admitting: Family Medicine

## 2022-10-22 VITALS — BP 128/80 | HR 76 | Temp 97.8°F | Resp 16 | Ht 61.0 in | Wt 160.1 lb

## 2022-10-22 DIAGNOSIS — I10 Essential (primary) hypertension: Secondary | ICD-10-CM | POA: Diagnosis not present

## 2022-10-22 DIAGNOSIS — I5032 Chronic diastolic (congestive) heart failure: Secondary | ICD-10-CM | POA: Diagnosis not present

## 2022-10-22 DIAGNOSIS — W19XXXD Unspecified fall, subsequent encounter: Secondary | ICD-10-CM

## 2022-10-22 DIAGNOSIS — N390 Urinary tract infection, site not specified: Secondary | ICD-10-CM

## 2022-10-22 DIAGNOSIS — D696 Thrombocytopenia, unspecified: Secondary | ICD-10-CM

## 2022-10-22 DIAGNOSIS — F419 Anxiety disorder, unspecified: Secondary | ICD-10-CM | POA: Diagnosis not present

## 2022-10-22 DIAGNOSIS — D6859 Other primary thrombophilia: Secondary | ICD-10-CM | POA: Diagnosis not present

## 2022-10-22 LAB — CBC
HCT: 42.2 % (ref 36.0–46.0)
Hemoglobin: 13.7 g/dL (ref 12.0–15.0)
MCHC: 32.5 g/dL (ref 30.0–36.0)
MCV: 106.3 fl — ABNORMAL HIGH (ref 78.0–100.0)
Platelets: 199 10*3/uL (ref 150.0–400.0)
RBC: 3.97 Mil/uL (ref 3.87–5.11)
RDW: 14.1 % (ref 11.5–15.5)
WBC: 6.9 10*3/uL (ref 4.0–10.5)

## 2022-10-22 LAB — BASIC METABOLIC PANEL
BUN: 17 mg/dL (ref 6–23)
CO2: 25 mEq/L (ref 19–32)
Calcium: 9.8 mg/dL (ref 8.4–10.5)
Chloride: 101 mEq/L (ref 96–112)
Creatinine, Ser: 0.67 mg/dL (ref 0.40–1.20)
GFR: 78.71 mL/min (ref >60.00)
Glucose, Bld: 107 mg/dL — ABNORMAL HIGH (ref 70–99)
Potassium: 4.1 mEq/L (ref 3.5–5.1)
Sodium: 138 mEq/L (ref 135–145)

## 2022-10-22 NOTE — Assessment & Plan Note (Signed)
Following with cardiologist. Last echo on 10/03/2022 showed LVEF 65 to 70%.  Left ventricular diastolic parameters indeterminate.

## 2022-10-22 NOTE — Patient Instructions (Addendum)
A few things to remember from today's visit:  Fall, subsequent encounter - Plan: Ambulatory referral to Home Health  Chronic diastolic heart failure (HCC)  Hypercoagulable state (HCC), Chronic  Hypertension, essential, benign  Anxiety disorder, unspecified type  No changes today. Home PT will be arranged. Continue monitoring blood pressure.  If you need refills for medications you take chronically, please call your pharmacy. Do not use My Chart to request refills or for acute issues that need immediate attention. If you send a my chart message, it may take a few days to be addressed, specially if I am not in the office.  Please be sure medication list is accurate. If a new problem present, please set up appointment sooner than planned today.

## 2022-10-22 NOTE — Assessment & Plan Note (Signed)
BP adequately controlled. Continue amlodipine 10 mg daily and metoprolol succinate 25 mg daily as well as low-salt diet. Recommend monitoring BP at home.

## 2022-10-22 NOTE — Assessment & Plan Note (Signed)
Fall precautions discussed. PT through home health will be arranged.

## 2022-10-22 NOTE — Assessment & Plan Note (Signed)
Mild. Instructed about warning signs. Further recommendation will be given according to CBC result.

## 2022-10-22 NOTE — Assessment & Plan Note (Signed)
On chronic anticoagulation, Eliquis, due to atrial fibrillation.

## 2022-10-22 NOTE — Assessment & Plan Note (Signed)
She reports problem is well-controlled with current regimen. Continue citalopram 20 mg daily and lorazepam 0.5 mg 1/2 tablet to 1 tablet daily as needed. Follow-up in 6 months, before if needed.

## 2022-10-24 NOTE — Assessment & Plan Note (Signed)
Completed Cefadroxil treatment. Asymptomatic. No further work up needed at this time. Continue adequate hydration.

## 2022-10-31 ENCOUNTER — Telehealth: Payer: Self-pay | Admitting: Family Medicine

## 2022-10-31 NOTE — Telephone Encounter (Signed)
Melissa OT with [facility] is calling for VO to move OT evaluation to 11-05-2022

## 2022-11-01 NOTE — Telephone Encounter (Signed)
I left a voicemail for Melissa giving the VO approval.

## 2022-11-04 ENCOUNTER — Other Ambulatory Visit (HOSPITAL_BASED_OUTPATIENT_CLINIC_OR_DEPARTMENT_OTHER): Payer: Self-pay | Admitting: Cardiovascular Disease

## 2022-12-16 ENCOUNTER — Other Ambulatory Visit: Payer: Self-pay | Admitting: Family Medicine

## 2023-01-02 ENCOUNTER — Ambulatory Visit (INDEPENDENT_AMBULATORY_CARE_PROVIDER_SITE_OTHER): Payer: Medicare Other

## 2023-01-02 DIAGNOSIS — Z23 Encounter for immunization: Secondary | ICD-10-CM

## 2023-01-27 ENCOUNTER — Other Ambulatory Visit: Payer: Self-pay | Admitting: Family Medicine

## 2023-01-27 DIAGNOSIS — I4891 Unspecified atrial fibrillation: Secondary | ICD-10-CM

## 2023-03-11 ENCOUNTER — Other Ambulatory Visit: Payer: Self-pay | Admitting: Family Medicine

## 2023-03-11 DIAGNOSIS — F419 Anxiety disorder, unspecified: Secondary | ICD-10-CM

## 2023-03-11 NOTE — Telephone Encounter (Signed)
Its expired

## 2023-03-11 NOTE — Telephone Encounter (Signed)
 It seems like she has a rx available at her pharmacy. Thanks, BJ

## 2023-03-12 ENCOUNTER — Telehealth: Payer: Self-pay

## 2023-03-12 NOTE — Telephone Encounter (Signed)
  Rx needs to be sent in, the one they had on file has expired.     Copied from CRM [id number]. Topic: Clinical - Medication Question >> Mar 12, 2023  2:54 PM Juvenal Opoka wrote: Reason for CRM: Patient needs doctor to reach out to [facility] for approval for  LORazepam  (ATIVAN ) 0.5 MG tablet so patient can have medication filled

## 2023-03-12 NOTE — Telephone Encounter (Signed)
Prescription was sent. Thanks, BJ

## 2023-03-24 ENCOUNTER — Ambulatory Visit: Payer: Medicare Other | Admitting: Family Medicine

## 2023-04-07 ENCOUNTER — Other Ambulatory Visit: Payer: Self-pay | Admitting: Family Medicine

## 2023-04-07 DIAGNOSIS — I10 Essential (primary) hypertension: Secondary | ICD-10-CM

## 2023-04-07 DIAGNOSIS — R000 Tachycardia, unspecified: Secondary | ICD-10-CM

## 2023-04-11 ENCOUNTER — Ambulatory Visit: Payer: Medicare Other | Admitting: Family Medicine

## 2023-04-28 ENCOUNTER — Other Ambulatory Visit: Payer: Self-pay | Admitting: Family Medicine

## 2023-04-28 DIAGNOSIS — F419 Anxiety disorder, unspecified: Secondary | ICD-10-CM

## 2023-05-12 ENCOUNTER — Other Ambulatory Visit: Payer: Self-pay | Admitting: Family Medicine

## 2023-05-12 DIAGNOSIS — E782 Mixed hyperlipidemia: Secondary | ICD-10-CM

## 2023-05-29 ENCOUNTER — Other Ambulatory Visit: Payer: Self-pay | Admitting: Family Medicine

## 2023-05-29 DIAGNOSIS — I4891 Unspecified atrial fibrillation: Secondary | ICD-10-CM

## 2023-05-29 NOTE — Telephone Encounter (Signed)
 Copied from CRM [id number]. Topic: Clinical - Medication Refill >> May 29, 2023  2:36 PM Fredrich Romans wrote: Most Recent Primary Care Visit:  Provider: Trenton Gammon  Department: [facility]  Visit Type: FLU SHOT  Date: 01/02/2023  Medication:  ELIQUIS 5 MG TABS tablet   Has the patient contacted their pharmacy? Yes (Agent: If no, request that the patient contact the pharmacy for the refill. If patient does not wish to contact the pharmacy document the reason why and proceed with request.) (Agent: If yes, when and what did the pharmacy advise?)  Is this the correct pharmacy for this prescription? Yes If no, delete pharmacy and type the correct one.  This is the patient's preferred pharmacy:  [facility] [place], [place] - [address] [place] [address] Phone: [phone] Fax: [phone]   Has the prescription been filled recently? No  Is the patient out of the medication? Yes  Has the patient been seen for an appointment in the last year OR does the patient have an upcoming appointment? Yes  Can we respond through MyChart? Yes  Agent: Please be advised that Rx refills may take up to 3 business days. We ask that you follow-up with your pharmacy.

## 2023-06-02 ENCOUNTER — Telehealth: Payer: Medicare Other | Admitting: Family Medicine

## 2023-06-17 ENCOUNTER — Telehealth: Admitting: Family Medicine

## 2023-06-17 ENCOUNTER — Encounter: Payer: Self-pay | Admitting: Family Medicine

## 2023-06-17 VITALS — BP 126/76 | Ht 61.0 in

## 2023-06-17 DIAGNOSIS — I4821 Permanent atrial fibrillation: Secondary | ICD-10-CM

## 2023-06-17 DIAGNOSIS — I4819 Other persistent atrial fibrillation: Secondary | ICD-10-CM

## 2023-06-17 DIAGNOSIS — F419 Anxiety disorder, unspecified: Secondary | ICD-10-CM

## 2023-06-17 DIAGNOSIS — I10 Essential (primary) hypertension: Secondary | ICD-10-CM

## 2023-06-17 DIAGNOSIS — I5032 Chronic diastolic (congestive) heart failure: Secondary | ICD-10-CM | POA: Diagnosis not present

## 2023-06-17 MED ORDER — CITALOPRAM HYDROBROMIDE 20 MG PO TABS: 10.0000 mg | ORAL_TABLET | Freq: Every day | ORAL | Status: DC

## 2023-06-17 MED ORDER — LORAZEPAM 0.5 MG PO TABS
0.5000 mg | ORAL_TABLET | Freq: Every evening | ORAL | 3 refills | Status: DC | PRN
Start: 2023-06-17 — End: 2023-07-05

## 2023-06-17 NOTE — Assessment & Plan Note (Signed)
 BP adequately controlled. Continue metoprolol  succinate 25 mg daily, spironolactone  25 mg daily, and amlodipine  10 mg daily. Continue low-salt diet. Continue monitoring BP regularly.

## 2023-06-17 NOTE — Assessment & Plan Note (Signed)
 Last echo on 10/03/2022 showed LVEF 65 to 70%.  Left ventricular diastolic parameters indeterminate. Currently on metoprolol  succinate 25 mg daily and spironolactone  25 mg daily.

## 2023-06-17 NOTE — Assessment & Plan Note (Signed)
 Reporting problem as well-controlled. She agrees with trying to decrease dose of Celexa . Instructed to alternate between Celexa  20 mg in the milligrams every other day for 2 weeks then continue 10 mg daily. She will let me know in about 6 to 8 weeks if problem is stable with lower dose of medication. We discussed some side effects and the risk of interaction with some of her medications.

## 2023-06-17 NOTE — Progress Notes (Signed)
 Virtual Visit via Video Note I connected with Linda Glass on 06/17/2023 by a video enabled telemedicine application and verified that I am speaking with the correct person using two identifiers. Location patient: home Location provider:work or home office Persons participating in the virtual visit: patient, patient's son-in-law, provider, scribe  I discussed the limitations of evaluation and management by telemedicine and the availability of in person appointments. The patient expressed understanding and agreed to proceed.  Chief Complaint  Patient presents with   Medical Management of Chronic Issues   HPI:  Linda Glass is a 89 y.o. female with a PMHx significant for HLD, hearing loss, anxiety, HTN, atrial fibrillation on chronic anticoagulation, diastolic dysfunction, and aortic atherosclerosis, who is being seen on video today for chronic disease management.  Last seen on 10/22/2022  Anxiety:  Currently on Celexa  20 mg daily.  Also on Lorazepam  0.5 mg 0.5-1 tablet as needed at bedtime. She asks today if she can take the whole tablet.   Hypertension:  Medications: Currently on amlodipine  10 mg daily, spironolactone  25 mg daily, and metoprolol  succinate 25 mg daily.  Negative for unusual or severe headache, visual changes, exertional chest pain, dyspnea,  focal weakness, or edema.  Lab Results  Component Value Date   CREATININE 0.67 10/22/2022   BUN 17 10/22/2022   NA 138 10/22/2022   K 4.1 10/22/2022   CL 101 10/22/2022   CO2 25 10/22/2022   Atrial fibrillation:  Currently on Eliquis  5 mg twice daily.   Hyperlipidemia: Currently on atorvastatin  40 mg daily.  Side effects from medication: none Lab Results  Component Value Date   CHOL 122 02/11/2020   HDL 63 02/11/2020   LDLCALC 36 02/11/2020   LDLDIRECT 120.0 08/26/2017   TRIG 145 02/11/2020   CHOLHDL 1.9 02/11/2020   No falls since her last visit.   ROS: See pertinent positives and  negatives per HPI.  Past Medical History:  Diagnosis Date   Anemia 04/2020   Anxiety    Arthritis    Chicken pox    COVID-19    Heart murmur    Hyperlipidemia    Hypertension     Past Surgical History:  Procedure Laterality Date   ABDOMINAL HYSTERECTOMY  1983   BIOPSY  05/05/2020   Procedure: BIOPSY;  Surgeon: Sergio Dandy, MD;  Location: [facility];  Service: Endoscopy;;  EGD and COLON   COLONOSCOPY WITH PROPOFOL  N/A 05/05/2020   Procedure: COLONOSCOPY WITH PROPOFOL ;  Surgeon: Sergio Dandy, MD;  Location: [facility];  Service: Endoscopy;  Laterality: N/A;   ESOPHAGOGASTRODUODENOSCOPY (EGD) WITH PROPOFOL  N/A 05/05/2020   Procedure: ESOPHAGOGASTRODUODENOSCOPY (EGD) WITH PROPOFOL ;  Surgeon: Sergio Dandy, MD;  Location: [facility];  Service: Endoscopy;  Laterality: N/A;   FRACTURE SURGERY     wrist fracture 2013   LAPAROSCOPIC PARTIAL COLECTOMY Right 03/12/2021   Procedure: LAPAROSCOPIC HAND ASSISTED RIGHT COLECTOMY;  Surgeon: Junie Olds, MD;  Location: [facility];  Service: General;  Laterality: Right;    Family History  Problem Relation Age of Onset   Hyperlipidemia Mother    Diabetes Neg Hx     Social History   Socioeconomic History   Marital status: Widowed    Spouse name: Not on file   Number of children: 2   Years of education: Not on file   Highest education level: Not on file  Occupational History   Not on file  Tobacco Use   Smoking status: Never   Smokeless tobacco: Never  Vaping Use   Vaping status: Never Used  Substance and Sexual Activity   Alcohol use: Not Currently    Alcohol/week: 7.0 standard drinks of alcohol    Types: 7 Glasses of wine per week    Comment: Occasional   Drug use: No   Sexual activity: Not Currently  Other Topics Concern   Not on file  Social History Narrative   Not on file   Social Drivers of Health   Financial Resource Strain: Low Risk  (08/21/2022)   Overall Financial Resource Strain  (CARDIA)    Difficulty of Paying Living Expenses: Not hard at all  Food Insecurity: No Food Insecurity (10/03/2022)   Hunger Vital Sign    Worried About Running Out of Food in the Last Year: Never true    Ran Out of Food in the Last Year: Never true  Transportation Needs: No Transportation Needs (10/03/2022)   PRAPARE - Administrator, Civil Service (Medical): No    Lack of Transportation (Non-Medical): No  Physical Activity: Insufficiently Active (08/21/2022)   Exercise Vital Sign    Days of Exercise per Week: 2 days    Minutes of Exercise per Session: 20 min  Stress: No Stress Concern Present (08/21/2022)   [employer] of Occupational Health - Occupational Stress Questionnaire    Feeling of Stress : Not at all  Social Connections: Socially Isolated (08/21/2022)   Social Connection and Isolation Panel [NHANES]    Frequency of Communication with Friends and Family: More than three times a week    Frequency of Social Gatherings with Friends and Family: More than three times a week    Attends Religious Services: Never    Database administrator or Organizations: No    Attends Banker Meetings: Never    Marital Status: Widowed  Intimate Partner Violence: Not At Risk (10/03/2022)   Humiliation, Afraid, Rape, and Kick questionnaire    Fear of Current or Ex-Partner: No    Emotionally Abused: No    Physically Abused: No    Sexually Abused: No     Current Outpatient Medications:    acetaminophen  (TYLENOL ) 325 MG tablet, Take 2 tablets (650 mg total) by mouth every 6 (six) hours as needed for mild pain (or Fever >/= 101)., Disp: , Rfl:    amLODipine  (NORVASC ) 10 MG tablet, TAKE ONE TABLET BY MOUTH DAILY, Disp: 90 tablet, Rfl: 3   apixaban  (ELIQUIS ) 5 MG TABS tablet, TAKE ONE TABLET BY MOUTH TWICE DAILY, Disp: 180 tablet, Rfl: 2   atorvastatin  (LIPITOR) 40 MG tablet, TAKE 1 TABLET EACH DAY., Disp: 90 tablet, Rfl: 2   citalopram  (CELEXA ) 20 MG tablet, Take 1 tablet (20  mg total) by mouth daily., Disp: 90 tablet, Rfl: 2   Cyanocobalamin (VITAMIN B 12 PO), Take 500 mcg by mouth daily., Disp: , Rfl:    Ferrous Gluconate  324 (37.5 Fe) MG TABS, Take 324 mg by mouth every other day., Disp: , Rfl:    fluticasone  (FLONASE ) 50 MCG/ACT nasal spray, Place 2 sprays into both nostrils daily., Disp: 16 g, Rfl: 2   furosemide  (LASIX ) 80 MG tablet, TAKE ONE TABLET BY MOUTH DAILY, Disp: 90 tablet, Rfl: 3   LORazepam  (ATIVAN ) 0.5 MG tablet, Take 0.5-1 tablets (0.25-0.5 mg total) by mouth at bedtime as needed for anxiety., Disp: 30 tablet, Rfl: 2   magnesium  oxide (MAG-OX) 400 (241.3 Mg) MG tablet, Take 0.5 tablets (200 mg total) by mouth daily. (Patient taking differently: Take 400 mg  by mouth daily.), Disp: , Rfl:    metoprolol  succinate (TOPROL -XL) 25 MG 24 hr tablet, TAKE ONE TABLET BY MOUTH DAILY, Disp: 90 tablet, Rfl: 1   Multiple Vitamin (MULTIVITAMIN WITH MINERALS) TABS tablet, Take 1 tablet by mouth daily., Disp: , Rfl:    ondansetron  (ZOFRAN ) 4 MG tablet, Take 1 tablet (4 mg total) by mouth every 8 (eight) hours as needed for nausea or vomiting., Disp: 20 tablet, Rfl: 0   pantoprazole  (PROTONIX ) 40 MG tablet, TAKE ONE TABLET BY MOUTH ONCE DAILY, Disp: 90 tablet, Rfl: 2   spironolactone  (ALDACTONE ) 25 MG tablet, Take 1 tablet (25 mg total) by mouth daily., Disp: 90 tablet, Rfl: 3  EXAM:  VITALS per patient if applicable:  GENERAL: alert, oriented, appears well and in no acute distress  HEENT: atraumatic, conjunctiva clear, no obvious abnormalities on inspection of external nose and ears  NECK: normal movements of the head and neck  LUNGS: on inspection no signs of respiratory distress, breathing rate appears normal, no obvious gross SOB, gasping or wheezing  CV: no obvious cyanosis  MS: moves all visible extremities without noticeable abnormality  PSYCH/NEURO: pleasant and cooperative, no obvious depression or anxiety, speech and thought processing grossly  intact  ASSESSMENT AND PLAN:  Discussed the following assessment and plan:  No diagnosis found.  There are no diagnoses linked to this encounter.  We discussed possible serious and likely etiologies, options for evaluation and workup, limitations of telemedicine visit vs in person visit, treatment, treatment risks and precautions. The patient was advised to call back or seek an in-person evaluation if the symptoms worsen or if the condition fails to improve as anticipated. I discussed the assessment and treatment plan with the patient. The patient was provided an opportunity to ask questions and all were answered. The patient agreed with the plan and demonstrated an understanding of the instructions.  No follow-ups on file.  I, Fritz Jewel Wierda, acting as a scribe for Aden Youngman [name], MD., have documented all relevant documentation on the behalf of Octavius Shin [name], MD, as directed by  Cheyanna Strick [name], MD while in the presence of Rowen Wilmer [name], MD.   I, Momen Ham [name], MD, have reviewed all documentation for this visit. The documentation on 06/17/23 for the exam, diagnosis, procedures, and orders are all accurate and complete.  Fritz Jewel Gloria Lares ***

## 2023-06-17 NOTE — Assessment & Plan Note (Signed)
 Currently on metoprolol  succinate 25 mg daily and Eliquis  5 mg twice daily. She is due for follow-up with cardiologist, recommend arranging appointment.

## 2023-06-19 ENCOUNTER — Encounter: Payer: Self-pay | Admitting: Family Medicine

## 2023-07-01 ENCOUNTER — Encounter (HOSPITAL_BASED_OUTPATIENT_CLINIC_OR_DEPARTMENT_OTHER): Payer: Self-pay | Admitting: Emergency Medicine

## 2023-07-01 ENCOUNTER — Other Ambulatory Visit: Payer: Self-pay

## 2023-07-01 ENCOUNTER — Ambulatory Visit: Payer: Self-pay

## 2023-07-01 ENCOUNTER — Observation Stay (HOSPITAL_BASED_OUTPATIENT_CLINIC_OR_DEPARTMENT_OTHER)
Admission: EM | Admit: 2023-07-01 | Discharge: 2023-07-07 | Disposition: A | Discharge status: Skilled Nursing Facility | Attending: Internal Medicine | Admitting: Internal Medicine

## 2023-07-01 DIAGNOSIS — E872 Acidosis, unspecified: Secondary | ICD-10-CM | POA: Diagnosis not present

## 2023-07-01 DIAGNOSIS — E66811 Obesity, class 1: Secondary | ICD-10-CM | POA: Insufficient documentation

## 2023-07-01 DIAGNOSIS — F1090 Alcohol use, unspecified, uncomplicated: Secondary | ICD-10-CM | POA: Insufficient documentation

## 2023-07-01 DIAGNOSIS — R251 Tremor, unspecified: Principal | ICD-10-CM | POA: Diagnosis present

## 2023-07-01 DIAGNOSIS — D696 Thrombocytopenia, unspecified: Secondary | ICD-10-CM | POA: Diagnosis not present

## 2023-07-01 DIAGNOSIS — I4811 Longstanding persistent atrial fibrillation: Secondary | ICD-10-CM | POA: Insufficient documentation

## 2023-07-01 DIAGNOSIS — F411 Generalized anxiety disorder: Secondary | ICD-10-CM | POA: Diagnosis not present

## 2023-07-01 DIAGNOSIS — I5032 Chronic diastolic (congestive) heart failure: Secondary | ICD-10-CM | POA: Insufficient documentation

## 2023-07-01 DIAGNOSIS — R748 Abnormal levels of other serum enzymes: Secondary | ICD-10-CM | POA: Diagnosis not present

## 2023-07-01 DIAGNOSIS — K219 Gastro-esophageal reflux disease without esophagitis: Secondary | ICD-10-CM | POA: Insufficient documentation

## 2023-07-01 DIAGNOSIS — R7989 Other specified abnormal findings of blood chemistry: Secondary | ICD-10-CM | POA: Diagnosis not present

## 2023-07-01 DIAGNOSIS — Z7901 Long term (current) use of anticoagulants: Secondary | ICD-10-CM | POA: Insufficient documentation

## 2023-07-01 DIAGNOSIS — R7401 Elevation of levels of liver transaminase levels: Secondary | ICD-10-CM | POA: Insufficient documentation

## 2023-07-01 DIAGNOSIS — I110 Hypertensive heart disease with heart failure: Secondary | ICD-10-CM | POA: Insufficient documentation

## 2023-07-01 DIAGNOSIS — Z8616 Personal history of COVID-19: Secondary | ICD-10-CM | POA: Insufficient documentation

## 2023-07-01 DIAGNOSIS — Z79899 Other long term (current) drug therapy: Secondary | ICD-10-CM | POA: Insufficient documentation

## 2023-07-01 DIAGNOSIS — F419 Anxiety disorder, unspecified: Secondary | ICD-10-CM

## 2023-07-01 DIAGNOSIS — E785 Hyperlipidemia, unspecified: Secondary | ICD-10-CM | POA: Insufficient documentation

## 2023-07-01 DIAGNOSIS — R000 Tachycardia, unspecified: Secondary | ICD-10-CM

## 2023-07-01 DIAGNOSIS — I10 Essential (primary) hypertension: Secondary | ICD-10-CM

## 2023-07-01 LAB — COMPREHENSIVE METABOLIC PANEL WITH GFR
ALT: 34 U/L (ref 0–44)
ALT: 29 U/L (ref 0–44)
AST: 63 U/L — ABNORMAL HIGH (ref 15–41)
AST: 56 U/L — ABNORMAL HIGH (ref 15–41)
Albumin: 4.2 g/dL (ref 3.5–5.0)
Albumin: 4.1 g/dL (ref 3.5–5.0)
Alkaline Phosphatase: 159 U/L — ABNORMAL HIGH (ref 38–126)
Alkaline Phosphatase: 143 U/L — ABNORMAL HIGH (ref 38–126)
Anion gap: 22 — ABNORMAL HIGH (ref 5–15)
Anion gap: 16 — ABNORMAL HIGH (ref 5–15)
BUN: 18 mg/dL (ref 8–23)
BUN: 17 mg/dL (ref 8–23)
CO2: 18 mmol/L — ABNORMAL LOW (ref 22–32)
CO2: 23 mmol/L (ref 22–32)
Calcium: 10.3 mg/dL (ref 8.9–10.3)
Calcium: 9.8 mg/dL (ref 8.9–10.3)
Chloride: 93 mmol/L — ABNORMAL LOW (ref 98–111)
Chloride: 94 mmol/L — ABNORMAL LOW (ref 98–111)
Creatinine, Ser: 0.81 mg/dL (ref 0.44–1.00)
Creatinine, Ser: 0.68 mg/dL (ref 0.44–1.00)
GFR, Estimated: >60 mL/min (ref >60)
GFR, Estimated: >60 mL/min (ref >60)
Glucose, Bld: 145 mg/dL — ABNORMAL HIGH (ref 70–99)
Glucose, Bld: 100 mg/dL — ABNORMAL HIGH (ref 70–99)
Potassium: 4.2 mmol/L (ref 3.5–5.1)
Potassium: 4.2 mmol/L (ref 3.5–5.1)
Sodium: 133 mmol/L — ABNORMAL LOW (ref 135–145)
Sodium: 132 mmol/L — ABNORMAL LOW (ref 135–145)
Total Bilirubin: 1.1 mg/dL (ref 0.0–1.2)
Total Bilirubin: 1.1 mg/dL (ref 0.0–1.2)
Total Protein: 7.5 g/dL (ref 6.5–8.1)
Total Protein: 6.6 g/dL (ref 6.5–8.1)

## 2023-07-01 LAB — MAGNESIUM
Magnesium: 1.3 mg/dL — ABNORMAL LOW (ref 1.7–2.4)
Magnesium: 2.2 mg/dL (ref 1.7–2.4)

## 2023-07-01 LAB — CBC WITH DIFFERENTIAL/PLATELET
Abs Immature Granulocytes: 0.02 10*3/uL (ref 0.00–0.07)
Basophils Absolute: 0 10*3/uL (ref 0.0–0.1)
Basophils Relative: 0 %
Eosinophils Absolute: 0 10*3/uL (ref 0.0–0.5)
Eosinophils Relative: 0 %
HCT: 39.4 % (ref 36.0–46.0)
Hemoglobin: 13.9 g/dL (ref 12.0–15.0)
Immature Granulocytes: 0 %
Lymphocytes Relative: 6 %
Lymphs Abs: 0.4 10*3/uL — ABNORMAL LOW (ref 0.7–4.0)
MCH: 36.3 pg — ABNORMAL HIGH (ref 26.0–34.0)
MCHC: 35.3 g/dL (ref 30.0–36.0)
MCV: 102.9 fL — ABNORMAL HIGH (ref 80.0–100.0)
Monocytes Absolute: 0.4 10*3/uL (ref 0.1–1.0)
Monocytes Relative: 6 %
Neutro Abs: 6 10*3/uL (ref 1.7–7.7)
Neutrophils Relative %: 88 %
Platelets: 132 10*3/uL — ABNORMAL LOW (ref 150–400)
RBC: 3.83 MIL/uL — ABNORMAL LOW (ref 3.87–5.11)
RDW: 13.4 % (ref 11.5–15.5)
WBC: 6.9 10*3/uL (ref 4.0–10.5)
nRBC: 0 % (ref 0.0–0.2)

## 2023-07-01 LAB — URINALYSIS, ROUTINE W REFLEX MICROSCOPIC
Bilirubin Urine: NEGATIVE
Glucose, UA: NEGATIVE mg/dL
Hgb urine dipstick: NEGATIVE
Ketones, ur: NEGATIVE mg/dL
Leukocytes,Ua: NEGATIVE
Nitrite: NEGATIVE
Protein, ur: NEGATIVE mg/dL
Specific Gravity, Urine: 1.01 (ref 1.005–1.030)
pH: 5 (ref 5.0–8.0)

## 2023-07-01 LAB — LACTIC ACID, PLASMA
Lactic Acid, Venous: 1.9 mmol/L (ref 0.5–1.9)
Lactic Acid, Venous: 1.4 mmol/L (ref 0.5–1.9)

## 2023-07-01 MED ORDER — CYANOCOBALAMIN 500 MCG PO TABS
500.0000 ug | ORAL_TABLET | Freq: Every day | ORAL | Status: DC
  Administered 2023-07-02 – 2023-07-07 (×6): 500 ug via ORAL
  Filled 2023-07-01 (×6): qty 1

## 2023-07-01 MED ORDER — ADULT MULTIVITAMIN W/MINERALS CH
1.0000 | ORAL_TABLET | Freq: Every day | ORAL | Status: DC
Start: 2023-07-02 — End: 2023-07-07
  Administered 2023-07-02 – 2023-07-07 (×6): 1 via ORAL
  Filled 2023-07-01 (×6): qty 1

## 2023-07-01 MED ORDER — ATORVASTATIN CALCIUM 40 MG PO TABS
40.0000 mg | ORAL_TABLET | Freq: Every day | ORAL | Status: DC
  Administered 2023-07-02 – 2023-07-07 (×6): 40 mg via ORAL
  Filled 2023-07-01 (×6): qty 1

## 2023-07-01 MED ORDER — FERROUS GLUCONATE 324 (38 FE) MG PO TABS
324.0000 mg | ORAL_TABLET | ORAL | Status: DC
  Administered 2023-07-02 – 2023-07-06 (×3): 324 mg via ORAL
  Filled 2023-07-01 (×3): qty 1

## 2023-07-01 MED ORDER — AMLODIPINE BESYLATE 5 MG PO TABS
10.0000 mg | ORAL_TABLET | Freq: Every day | ORAL | Status: DC
  Administered 2023-07-02 – 2023-07-07 (×6): 10 mg via ORAL
  Filled 2023-07-01 (×6): qty 2

## 2023-07-01 MED ORDER — CITALOPRAM HYDROBROMIDE 20 MG PO TABS
10.0000 mg | ORAL_TABLET | Freq: Every day | ORAL | Status: DC
  Administered 2023-07-02 – 2023-07-03 (×2): 10 mg via ORAL
  Filled 2023-07-01 (×2): qty 1

## 2023-07-01 MED ORDER — APIXABAN 5 MG PO TABS
5.0000 mg | ORAL_TABLET | Freq: Two times a day (BID) | ORAL | Status: DC
  Administered 2023-07-01 – 2023-07-07 (×12): 5 mg via ORAL
  Filled 2023-07-01 (×13): qty 1

## 2023-07-01 MED ORDER — PANTOPRAZOLE SODIUM 40 MG PO TBEC
40.0000 mg | DELAYED_RELEASE_TABLET | Freq: Every day | ORAL | Status: DC
  Administered 2023-07-02 – 2023-07-07 (×6): 40 mg via ORAL
  Filled 2023-07-01 (×6): qty 1

## 2023-07-01 MED ORDER — LORAZEPAM 0.5 MG PO TABS
0.5000 mg | ORAL_TABLET | Freq: Every evening | ORAL | Status: DC | PRN
  Administered 2023-07-02: 0.5 mg via ORAL
  Filled 2023-07-01: qty 1

## 2023-07-01 MED ORDER — LORAZEPAM 1 MG PO TABS
0.5000 mg | ORAL_TABLET | Freq: Once | ORAL | Status: AC
  Administered 2023-07-01: 0.5 mg via ORAL
  Filled 2023-07-01: qty 1

## 2023-07-01 MED ORDER — METOPROLOL SUCCINATE ER 50 MG PO TB24
25.0000 mg | ORAL_TABLET | Freq: Every day | ORAL | Status: DC
  Administered 2023-07-02 – 2023-07-06 (×5): 25 mg via ORAL
  Filled 2023-07-01 (×5): qty 1

## 2023-07-01 MED ORDER — HYDRALAZINE HCL 20 MG/ML IJ SOLN: 10.0000 mg | Freq: Four times a day (QID) | INTRAMUSCULAR | Status: DC | PRN

## 2023-07-01 MED ORDER — FUROSEMIDE 40 MG PO TABS
80.0000 mg | ORAL_TABLET | Freq: Every day | ORAL | Status: DC
  Administered 2023-07-02 – 2023-07-07 (×6): 80 mg via ORAL
  Filled 2023-07-01 (×6): qty 2

## 2023-07-01 MED ORDER — MAGNESIUM OXIDE -MG SUPPLEMENT 400 (240 MG) MG PO TABS
400.0000 mg | ORAL_TABLET | Freq: Every day | ORAL | Status: DC
  Administered 2023-07-02 – 2023-07-07 (×6): 400 mg via ORAL
  Filled 2023-07-01 (×6): qty 1

## 2023-07-01 MED ORDER — LACTATED RINGERS IV BOLUS
1000.0000 mL | Freq: Once | INTRAVENOUS | Status: AC
  Administered 2023-07-01: 1000 mL via INTRAVENOUS

## 2023-07-01 MED ORDER — MAGNESIUM SULFATE 2 GM/50ML IV SOLN
2.0000 g | Freq: Once | INTRAVENOUS | Status: AC
  Administered 2023-07-01: 2 g via INTRAVENOUS
  Filled 2023-07-01: qty 50

## 2023-07-01 MED ORDER — SODIUM CHLORIDE 0.9 % IV SOLN: Freq: Once | INTRAVENOUS | Status: AC

## 2023-07-01 NOTE — H&P (Incomplete)
 History and Physical  Linda Glass ZOX:[id number] DOB: February 17, 1934 DOA: 07/01/2023  PCP: [name], [name], MD   Chief Complaint: Tremors  HPI: Linda Glass is a 89 y.o. female with medical history significant for anxiety, A-fib on Eliquis , chronic diastolic HF, HLD, HTN and arthritis who presented to [facility] for evaluation of tremors.  Patient reports that since yesterday evening, she has felt anxious and tremulous in her upper and lower extremities.  Her tremors worsened this morning and she had trouble walking due to the legs shaking so her neighbor brought her to the ED.  Patient reports that her doctor is currently weaning down her Celexa .  She recently started alternating 20 mg and 10 mg of her Celexa  daily 2 weeks with plan to remain on the 10 mg dose after 2 weeks.  She is also on Ativan  at night to help her sleep and reports taking this the night prior to arrival to the ED. She denies any palpitations, chest pain, dizziness, headache, vision changes, nausea, vomiting or abdominal pain.  During evaluation, patient reports some improvement in her tremors compared to this morning.  ED Course: Initial vitals showed temp 98.2, RR 17, HR 91, BP 158/72, SpO2 93% on room air. Labs show WBC 6.9, Hgb 13.9, platelet 132, sodium 133, K+ 4.2, bicarb 18, creatinine 0.81, AST/ALT 63/34, alk phos 159, bilirubin 1.1, mag 1.3->2.2, lactic acid 1.9->1.4, unremarkable UA.  Patient received IV LR 1 L bolus, Ativan  0.5 mg x 1, and IV mag 2 g.  Patient was admitted to [facility] service and transferred to [facility].    Review of Systems: Please see HPI for pertinent positives and negatives. A complete 10 system review of systems are otherwise negative.  Past Medical History:  Diagnosis Date   Anemia 04/2020   Anxiety    Arthritis    Chicken pox    COVID-19    Heart murmur    Hyperlipidemia    Hypertension    Past Surgical History:  Procedure Laterality Date   ABDOMINAL  HYSTERECTOMY  1983   BIOPSY  05/05/2020   Procedure: BIOPSY;  Surgeon: Sergio Dandy, MD;  Location: [facility];  Service: Endoscopy;;  EGD and COLON   COLONOSCOPY WITH PROPOFOL  N/A 05/05/2020   Procedure: COLONOSCOPY WITH PROPOFOL ;  Surgeon: Sergio Dandy, MD;  Location: [facility];  Service: Endoscopy;  Laterality: N/A;   ESOPHAGOGASTRODUODENOSCOPY (EGD) WITH PROPOFOL  N/A 05/05/2020   Procedure: ESOPHAGOGASTRODUODENOSCOPY (EGD) WITH PROPOFOL ;  Surgeon: Sergio Dandy, MD;  Location: [facility];  Service: Endoscopy;  Laterality: N/A;   FRACTURE SURGERY     wrist fracture 2013   LAPAROSCOPIC PARTIAL COLECTOMY Right 03/12/2021   Procedure: LAPAROSCOPIC HAND ASSISTED RIGHT COLECTOMY;  Surgeon: Junie Olds, MD;  Location: [facility];  Service: General;  Laterality: Right;   Social History:  reports that she has never smoked. She has never used smokeless tobacco. She reports that she does not currently use alcohol after a past usage of about 7.0 standard drinks of alcohol per week. She reports that she does not use drugs.  Allergies  Allergen Reactions   Losartan  Other (See Comments)    diarrhea    Family History  Problem Relation Age of Onset   Hyperlipidemia Mother    Diabetes Neg Hx      Prior to Admission medications   Medication Sig Start Date End Date Taking? Authorizing Provider  acetaminophen  (TYLENOL ) 325 MG tablet Take 2 tablets (650 mg total) by mouth  every 6 (six) hours as needed for mild pain (or Fever >/= 101). 09/21/21  Yes Sheikh, Omair Latif, DO  amLODipine  (NORVASC ) 10 MG tablet TAKE ONE TABLET BY MOUTH DAILY 11/04/22  Yes Maudine Sos, MD  apixaban  (ELIQUIS ) 5 MG TABS tablet TAKE ONE TABLET BY MOUTH TWICE DAILY 05/30/23  Yes [name], [name], MD  atorvastatin  (LIPITOR) 40 MG tablet TAKE 1 TABLET EACH DAY. 05/12/23  Yes [name], [name], MD  citalopram  (CELEXA ) 20 MG tablet Take 0.5 tablets (10 mg total) by mouth daily. 06/17/23  Yes [name], [name],  MD  Cyanocobalamin (VITAMIN B 12 PO) Take 500 mcg by mouth daily.   Yes [provider]  Ferrous Gluconate  324 (37.5 Fe) MG TABS Take 324 mg by mouth every other day.   Yes [provider]  furosemide  (LASIX ) 80 MG tablet TAKE ONE TABLET BY MOUTH DAILY 10/21/22  Yes Maudine Sos, MD  LORazepam  (ATIVAN ) 0.5 MG tablet Take 1 tablet (0.5 mg total) by mouth at bedtime as needed for anxiety. 06/17/23  Yes [name], [name], MD  magnesium  oxide (MAG-OX) 400 (241.3 Mg) MG tablet Take 0.5 tablets (200 mg total) by mouth daily. Patient taking differently: Take 400 mg by mouth daily. 05/12/20  Yes [place], Eric J, DO  metoprolol  succinate (TOPROL -XL) 25 MG 24 hr tablet TAKE ONE TABLET BY MOUTH DAILY 04/07/23  Yes [name], [name], MD  Multiple Vitamin (MULTIVITAMIN WITH MINERALS) TABS tablet Take 1 tablet by mouth daily.   Yes [provider]  pantoprazole  (PROTONIX ) 40 MG tablet TAKE ONE TABLET BY MOUTH ONCE DAILY 12/16/22  Yes [name], [name], MD  fluticasone  (FLONASE ) 50 MCG/ACT nasal spray Place 2 sprays into both nostrils daily. Patient not taking: Reported on 07/01/2023 10/25/21   Aida House, MD  ondansetron  (ZOFRAN ) 4 MG tablet Take 1 tablet (4 mg total) by mouth every 8 (eight) hours as needed for nausea or vomiting. Patient not taking: Reported on 07/01/2023 10/25/21   Aida House, MD  spironolactone  (ALDACTONE ) 25 MG tablet Take 1 tablet (25 mg total) by mouth daily. Patient not taking: Reported on 07/01/2023 10/04/22 09/29/23  Kraig [place], MD    Physical Exam: BP 92/62 (BP Location: Left Arm)   Pulse 65   Temp 98 F (36.7 C)   Resp 16   Ht 5' (1.524 m)   Wt 71.2 kg   SpO2 92%   BMI 30.66 kg/m  General: Pleasant, but slightly restless elderly woman laying in bed. No acute distress. HEENT: [place]/AT. Anicteric sclera CV: RRR. No murmurs, rubs, or gallops. No LE edema Pulmonary: Lungs CTAB. Normal effort. No wheezing or rales. Abdominal: Soft, nontender,  nondistended. Normal bowel sounds. Extremities: Palpable radial and DP pulses. Normal ROM. Skin: Warm and dry. No obvious rash or lesions. Neuro: A&Ox3. Mild tremors of the hands. Moves all extremities. Normal sensation to light touch. Psych: Anxious mood         Labs on Admission:  Basic Metabolic Panel: Recent Labs  Lab 07/01/23 1323 07/01/23 1616  NA 133* 132*  K 4.2 4.2  CL 93* 94*  CO2 18* 23  GLUCOSE 145* 100*  BUN 18 17  CREATININE 0.81 0.68  CALCIUM  10.3 9.8  MG 1.3* 2.2   Liver Function Tests: Recent Labs  Lab 07/01/23 1323 07/01/23 1616  AST 63* 56*  ALT 34 29  ALKPHOS 159* 143*  BILITOT 1.1 1.1  PROT 7.5 6.6  ALBUMIN 4.2 4.1   No results for  input(s): "LIPASE", "AMYLASE" in the last 168 hours. No results for input(s): "AMMONIA" in the last 168 hours. CBC: Recent Labs  Lab 07/01/23 1323  WBC 6.9  NEUTROABS 6.0  HGB 13.9  HCT 39.4  MCV 102.9*  PLT 132*   Cardiac Enzymes: No results for input(s): "CKTOTAL", "CKMB", "CKMBINDEX", "TROPONINI" in the last 168 hours. BNP (last 3 results) No results for input(s): "BNP" in the last 8760 hours.  ProBNP (last 3 results) No results for input(s): "PROBNP" in the last 8760 hours.  CBG: No results for input(s): "GLUCAP" in the last 168 hours.  Radiological Exams on Admission: No results found. Assessment/Plan Linda Glass is a 89 y.o. female with medical history significant for or anxiety, A-fib on Eliquis , chronic diastolic HF, HLD, HTN and arthritis who presented to the [facility] for evaluation of tremors.   # Tremors - Elderly patient on Celexa  and Ativan  presenting with acute onset of bilateral hand and leg tremors - The cause of her tremors still unclear but likely secondary to benzo withdrawal versus worsening anxiety - EtOH withdrawal also possible however patient reports only drinking 3-4 glasses of wine a week - Reports improvement in tremors after receiving Ativan  in the  ED - Admit for close monitoring - Resume home Ativan  - Check CIWA - PT/OT eval and treat - Follow-up TSH, ethanol levels, mag and Phos - Fall precautions  # Persistent A-fib - HR stable in the 60s to 80s - Continue Eliquis  and Toprol -XL - Telemetry  # Chronic diastolic HF - Patient euvolemic on exam - Resume home Lasix  tomorrow - Continue Toprol -XL as above  # HTN - BP elevated with SBP in the 140s to 180s - Continue amlodipine  and Toprol -XL - As needed IV hydralazine  for SBP >180  # Elevated alk phos # Transaminitis - Initial CMP showed AST/ALT 63/34 and alk phos 159, mild improvement on repeat to 56/29 and 143 - Patient reports drinking 3-4 glasses of wine weekly. - The 2:1 ratio of AST/ALT likely indicate EtOH use as the cause of her transaminitis - Check RUQ U/S - Trend LFTs  # Anxiety - Continue Celexa  and PRN ativan   # HLD - Continue atorvastatin   # GERD - Continue Protonix   DVT prophylaxis: Eliquis     Code Status: Limited: Do not attempt resuscitation (DNR) -DNR-LIMITED -Do Not Intubate/DNI   Consults called: None  Family Communication: No family at bedside  Severity of Illness: The appropriate patient status for this patient is OBSERVATION. Observation status is judged to be reasonable and necessary in order to provide the required intensity of service to ensure the patient's safety. The patient's presenting symptoms, physical exam findings, and initial radiographic and laboratory data in the context of their medical condition is felt to place them at decreased risk for further clinical deterioration. Furthermore, it is anticipated that the patient will be medically stable for discharge from the hospital within 2 midnights of admission.   Level of care: Telemetry   This record has been created using Conservation officer, historic buildings. Errors have been sought and corrected, but may not always be located. Such creation errors do not reflect on the standard of  care.   Vita Grip, MD 07/01/2023, 9:35 PM [facility] Pager: [phone] Isaiah 41:10   If 7PM-7AM, please contact night-coverage [email or web address] Password [email or web address]

## 2023-07-01 NOTE — ED Notes (Signed)
 Caretha Chapel from [facility] called for transport

## 2023-07-01 NOTE — Plan of Care (Signed)
 Transfer from [facility] Linda Glass is a 89 year old female with pmh HTN, HLD, anemia, anxiety, and arthritis who presents with increasing tremors only present with activity for which she has been unable to walk.  Recently noted to be tapering off of Celexa  and is on Ativan .  Incidentally initial labs noted to have hyponatremia , metabolic acidosis with elevated anion gap of 22, and elevated alkaline phosphatase and AST not previously noted.  Magnesium  level was also noted to be low.  Patient had been given IV fluids, magnesium  replacement.  Repeat labs noted to be improved.  Orders placed for observation at [facility] for further workup.

## 2023-07-01 NOTE — ED Provider Notes (Signed)
 [place] EMERGENCY DEPARTMENT AT [facility] Provider Note   CSN: [id number] Arrival date & time: 07/01/23  1207     History Chief Complaint  Patient presents with   Tremors    Linda Glass is a 89 y.o. female history of afib on Elliquis, CHF, thrombocytopenia, anxiety, hypertension, hyperlipidemia presents to the emergency department today for evaluation of tremors.  Patient reports that she was recently tapering off of her Celexa  as advised by her PCP.  She usually takes 20 mg daily but was advised to alternate between taking 20 mg and 10 mg every other day.  She reports that she started to feel more anxious and felt some tremors last night that worsened into today.  She reports that she is feeling so tremulous that she could not walk.  She does feel like her anxiety has been worsened, but is unsure from what.  Her neighbor is who brought her in.  [place]  She is prescribed Ativan  at night.  She denies any tobacco use.  Reports she drinks 3 to 4 glasses of wine per week.  Denies any illicit drug use.  HPI     Home Medications Prior to Admission medications   Medication Sig Start Date End Date Taking? Authorizing Provider  acetaminophen  (TYLENOL ) 325 MG tablet Take 2 tablets (650 mg total) by mouth every 6 (six) hours as needed for mild pain (or Fever >/= 101). 09/21/21   Sheikh, Omair Latif, DO  amLODipine  (NORVASC ) 10 MG tablet TAKE ONE TABLET BY MOUTH DAILY 11/04/22   Maudine Sos, MD  apixaban  (ELIQUIS ) 5 MG TABS tablet TAKE ONE TABLET BY MOUTH TWICE DAILY 05/30/23   [name], [name], MD  atorvastatin  (LIPITOR) 40 MG tablet TAKE 1 TABLET EACH DAY. 05/12/23   [name], [name], MD  citalopram  (CELEXA ) 20 MG tablet Take 0.5 tablets (10 mg total) by mouth daily. 06/17/23   [name], [name], MD  Cyanocobalamin (VITAMIN B 12 PO) Take 500 mcg by mouth daily.    [provider]  Ferrous Gluconate  324 (37.5 Fe) MG TABS Take 324 mg by mouth  every other day.    [provider]  fluticasone  (FLONASE ) 50 MCG/ACT nasal spray Place 2 sprays into both nostrils daily. 10/25/21   Aida House, MD  furosemide  (LASIX ) 80 MG tablet TAKE ONE TABLET BY MOUTH DAILY 10/21/22   Maudine Sos, MD  LORazepam  (ATIVAN ) 0.5 MG tablet Take 1 tablet (0.5 mg total) by mouth at bedtime as needed for anxiety. 06/17/23   [name], [name], MD  magnesium  oxide (MAG-OX) 400 (241.3 Mg) MG tablet Take 0.5 tablets (200 mg total) by mouth daily. Patient taking differently: Take 400 mg by mouth daily. 05/12/20   [place], Rema Care, DO  metoprolol  succinate (TOPROL -XL) 25 MG 24 hr tablet TAKE ONE TABLET BY MOUTH DAILY 04/07/23   [name], [name], MD  Multiple Vitamin (MULTIVITAMIN WITH MINERALS) TABS tablet Take 1 tablet by mouth daily.    [provider]  ondansetron  (ZOFRAN ) 4 MG tablet Take 1 tablet (4 mg total) by mouth every 8 (eight) hours as needed for nausea or vomiting. 10/25/21   Aida House, MD  pantoprazole  (PROTONIX ) 40 MG tablet TAKE ONE TABLET BY MOUTH ONCE DAILY 12/16/22   [name], [name], MD  spironolactone  (ALDACTONE ) 25 MG tablet Take 1 tablet (25 mg total) by mouth daily. 10/04/22 09/29/23  Kraig [place], MD      Allergies  Losartan     Review of Systems   Review of Systems  Constitutional:  Negative for chills and fever.  Respiratory:  Negative for shortness of breath.   Cardiovascular:  Negative for chest pain.  Gastrointestinal:  Negative for abdominal pain, nausea and vomiting.  Genitourinary:  Negative for dysuria.  Neurological:  Positive for tremors. Negative for weakness.  Psychiatric/Behavioral:  The patient is nervous/anxious.     Physical Exam Updated Vital Signs BP (!) 158/72 (BP Location: Left Arm)   Pulse 91   Temp 98.2 F (36.8 C)   Resp 17   Ht 5' (1.524 m)   Wt 71.2 kg   SpO2 93%   BMI 30.66 kg/m  Physical Exam Vitals and nursing note reviewed.  Constitutional:      Appearance: She is  not toxic-appearing.     Comments: Anxious appearing, tremulous in all extremities with movements.   HENT:     Mouth/Throat:     Mouth: Mucous membranes are dry.  Eyes:     General: No scleral icterus. Cardiovascular:     Rate and Rhythm: Normal rate.  Pulmonary:     Effort: Pulmonary effort is normal. No respiratory distress.  Abdominal:     Palpations: Abdomen is soft.     Tenderness: There is no abdominal tenderness. There is no guarding or rebound.  Musculoskeletal:     Right lower leg: No edema.     Left lower leg: No edema.  Skin:    General: Skin is warm and dry.  Neurological:     Mental Status: She is alert and oriented to person, place, and time.     Cranial Nerves: No facial asymmetry.     Motor: No weakness.     Comments: Tremulous in all extremities, no focal weakness. Tremors present only with activity/movement.     ED Results / Procedures / Treatments   Labs (all labs ordered are listed, but only abnormal results are displayed) Labs Reviewed  CBC WITH DIFFERENTIAL/PLATELET - Abnormal; Notable for the following components:      Result Value   RBC 3.83 (*)    MCV 102.9 (*)    MCH 36.3 (*)    Platelets 132 (*)    Lymphs Abs 0.4 (*)    All other components within normal limits  COMPREHENSIVE METABOLIC PANEL WITH GFR - Abnormal; Notable for the following components:   Sodium 133 (*)    Chloride 93 (*)    CO2 18 (*)    Glucose, Bld 145 (*)    AST 63 (*)    Alkaline Phosphatase 159 (*)    Anion gap 22 (*)    All other components within normal limits  MAGNESIUM  - Abnormal; Notable for the following components:   Magnesium  1.3 (*)    All other components within normal limits  URINALYSIS, ROUTINE W REFLEX MICROSCOPIC  LACTIC ACID, PLASMA  LACTIC ACID, PLASMA    EKG None  Radiology No results found.  Procedures Procedures   Medications Ordered in ED Medications  magnesium  sulfate IVPB 2 g 50 mL (2 g Intravenous New Bag/Given 07/01/23 1444)   LORazepam  (ATIVAN ) tablet 0.5 mg (0.5 mg Oral Given 07/01/23 1330)  lactated ringers  bolus 1,000 mL (1,000 mLs Intravenous New Bag/Given 07/01/23 1445)    ED Course/ Medical Decision Making/ A&P                                Medical  Decision Making Amount and/or Complexity of Data Reviewed Labs: ordered.  Risk Prescription drug management. Decision regarding hospitalization.   89 y.o. female presents to the ER for evaluation of tremors and anxiety. Differential diagnosis includes but is not limited to withdrawal, electrolyte abnormality, psych, Parkinson's. Vital signs mildly elevated BP for 159/65, otherwise unremarkable. Physical exam as noted above.   I independently reviewed and interpreted the patient's labs.  CMP shows a sodium of 133, chloride of 93, bicarb of 18 with a glucose of 145.  Anion Is elevated at 22.  AST is 63 with alk phos at 159.  She does have slightly elevated liver enzymes in comparison to her previous labs.  BUN and creatinine within her normal limits.  CBC without leukocytosis or anemia.  The patient denies any aspirin naproxen use.  She reports that she only takes Tylenol  because she is on Eliquis .  She reports that she only has maybe 3 to 4 glasses of wine a week but never anything more.  He has not been any recent antibiotics.  She does take an iron  pill every other day.  From reviewing elevated anion gap possible etiology, could be from her iron  supplementation.  I did repeat the CMP and magnesium .  Her magnesium  has improved and is at 2.0.  She still has an elevated anion gap at 16 however has improved from the previous of 22.  AST and alk phos still mildly elevated.  I have attempted to walk the patient at bedside however she is having significant tremors and is unable to stand without fear of falling.  Given this, her age, living at home, as well as her lab normalities, do flight this needs further investigation.  Will continue to order the patient fluids and  wait for transfer to main hospital for further evaluation.  I have discussed this with the patient and neighbor at bedside and they are agreeable to the plan. Spoke with Dr. Manny Sees, hospitalist, who will admit.  I discussed this case with my attending physician who cosigned this note including patient's presenting symptoms, physical exam, and planned diagnostics and interventions. Attending physician stated agreement with plan or made changes to plan which were implemented.   Attending physician assessed patient at bedside.   Portions of this report may have been transcribed using voice recognition software. Every effort was made to ensure accuracy; however, inadvertent computerized transcription errors may be present.     Final Clinical Impression(s) / ED Diagnoses Final diagnoses:  Tremor  Hypomagnesemia  Metabolic acidosis    Rx / DC Orders ED Discharge Orders     None         Spence Dux, PA-C 07/01/23 1803    Afton Horse T, DO 07/02/23 [phone]

## 2023-07-01 NOTE — Telephone Encounter (Signed)
 Patient is at the ED

## 2023-07-01 NOTE — Telephone Encounter (Signed)
 Copied from CRM [id number]. Topic: Clinical - Red Word Triage >> Jul 01, 2023 11:06 AM Shereese L wrote: [facility] that prompted transfer to Nurse Triage: citalopram  (CELEXA ) 20 MG tablet Shaking, can't get up, afraid and can barely hold anything for a couple days  Chief Complaint: shaking and cannot hold a glass.   Symptoms: sudden onset of shaking Frequency: constant Pertinent Negatives: Patient denies fever, cold like symptoms Disposition: [x] ED /[] Urgent Care (no appt availability in office) / [] Appointment(In office/virtual)/ []  [place] [facility]/ [] Home Care/ [] Refused Recommended Disposition /[] [place] [facility]/ []  Follow-up with PCP Additional Notes: instructed to go to ER.  Pcp office updated.   Reason for Disposition  Medicine patch causing local rash or itching  Answer Assessment - Initial Assessment Questions 1. NAME of MEDICINE: "What medicine(s) are you calling about?"     Celexa  a week ago 2. QUESTION: "What is your question?" (e.g., double dose of medicine, side effect)     States having a reaction; states shaking and cannot hold anything.  3. PRESCRIBER: "Who prescribed the medicine?" Reason: if prescribed by specialist, call should be referred to that group.     Dr. [name] 4. SYMPTOMS: "Do you have any symptoms?" If Yes, ask: "What symptoms are you having?"  "How bad are the symptoms (e.g., mild, moderate, severe)     diarrhea 5. PREGNANCY:  "Is there any chance that you are pregnant?" "When was your last menstrual period?"     na  Protocols used: Medication Question Call-A-AH

## 2023-07-01 NOTE — ED Triage Notes (Signed)
 States she increased "shaking uncontrollably" since yesterday, Denies any other symptoms. Appears very anxious in triage.

## 2023-07-01 NOTE — ED Notes (Signed)
 Spoke with patient about attempting to get up to bedside commode.  Patient refusing state she does not believe she can stand and afraid to get up.  Urinating multiple times.  Placed on per wick due to being bed ridden.

## 2023-07-01 NOTE — ED Notes (Signed)
 Pt assisted onto bedpan.

## 2023-07-01 NOTE — ED Triage Notes (Signed)
 Pt also c/o pain, bruising and swelling to L big toe and foot stating she "stubbed her toe" at home, pt does take Eliquis , denies fall.

## 2023-07-01 NOTE — H&P (Incomplete)
 History and Physical  Linda Glass ZHY:[id number] DOB: 13-Jan-1934 DOA: 07/01/2023  PCP: [name], [name], MD   Chief Complaint: Tremors  HPI: Linda Glass is a 89 y.o. female with medical history significant for anxiety, A-fib on Eliquis , chronic diastolic HF, HLD, HTN and arthritis who presented [facility] for evaluation of tremors.  Patient reports that since yesterday evening, she has felt anxious and tremulous in her upper and lower extremities.  Her tremors worsened this morning and she had trouble walking due to the legs shaking so her neighbor brought her to the ED.  Patient reports that her doctor is currently weaning down her Celexa .  She recently started alternating 20 mg and 10 mg of her Celexa  daily 2 weeks with plan to remain on the 10 mg dose after 2 weeks.  She is also on Ativan  at night to help her sleep and reports taking this the night prior to arrival to the ED. She denies any palpitations, chest pain, dizziness, headache, vision changes, nausea, vomiting or abdominal pain.  During evaluation, patient reports some improvement in her tremors compared to this morning.  ED Course: Initial vitals showed temp 98.2, RR 17, HR 91, BP 158/72, SpO2 93% on room air. Labs show WBC 6.9, Hgb 13.9, platelet 132, sodium 133, K+ 4.2, bicarb 18, creatinine 0.81, AST/ALT 63/34, alk phos 159, bilirubin 1.1, mag 1.3->2.2, lactic acid 1.9->1.4, unremarkable UA.  Patient received IV LR 1 L bolus, Ativan  0.5 mg x 1, and IV mag 2 g.  Patient was admitted to [facility] service and transferred to [facility].    Review of Systems: Please see HPI for pertinent positives and negatives. A complete 10 system review of systems are otherwise negative.  Past Medical History:  Diagnosis Date  . Anemia 04/2020  . Anxiety   . Arthritis   . Chicken pox   . COVID-19   . Heart murmur   . Hyperlipidemia   . Hypertension    Past Surgical History:  Procedure Laterality Date  .  ABDOMINAL HYSTERECTOMY  1983  . BIOPSY  05/05/2020   Procedure: BIOPSY;  Surgeon: Sergio Dandy, MD;  Location: [facility];  Service: Endoscopy;;  EGD and COLON  . COLONOSCOPY WITH PROPOFOL  N/A 05/05/2020   Procedure: COLONOSCOPY WITH PROPOFOL ;  Surgeon: Sergio Dandy, MD;  Location: [facility];  Service: Endoscopy;  Laterality: N/A;  . ESOPHAGOGASTRODUODENOSCOPY (EGD) WITH PROPOFOL  N/A 05/05/2020   Procedure: ESOPHAGOGASTRODUODENOSCOPY (EGD) WITH PROPOFOL ;  Surgeon: Sergio Dandy, MD;  Location: [facility];  Service: Endoscopy;  Laterality: N/A;  . FRACTURE SURGERY     wrist fracture 2013  . LAPAROSCOPIC PARTIAL COLECTOMY Right 03/12/2021   Procedure: LAPAROSCOPIC HAND ASSISTED RIGHT COLECTOMY;  Surgeon: Junie Olds, MD;  Location: [facility] OR[facility];  Service: General;  Laterality: Right;   Social History:  reports that she has never smoked. She has never used smokeless tobacco. She reports that she does not currently use alcohol after a past usage of about 7.0 standard drinks of alcohol per week. She reports that she does not use drugs.  Allergies  Allergen Reactions  . Losartan  Other (See Comments)    diarrhea    Family History  Problem Relation Age of Onset  . Hyperlipidemia Mother   . Diabetes Neg Hx      Prior to Admission medications   Medication Sig Start Date End Date Taking? Authorizing Provider  acetaminophen  (TYLENOL ) 325 MG tablet Take 2 tablets (650 mg total) by mouth  every 6 (six) hours as needed for mild pain (or Fever >/= 101). 09/21/21  Yes Sheikh, Omair Latif, DO  amLODipine  (NORVASC ) 10 MG tablet TAKE ONE TABLET BY MOUTH DAILY 11/04/22  Yes Maudine Sos, MD  apixaban  (ELIQUIS ) 5 MG TABS tablet TAKE ONE TABLET BY MOUTH TWICE DAILY 05/30/23  Yes [name], [name], MD  atorvastatin  (LIPITOR) 40 MG tablet TAKE 1 TABLET EACH DAY. 05/12/23  Yes [name], [name], MD  citalopram  (CELEXA ) 20 MG tablet Take 0.5 tablets (10 mg total) by mouth daily. 06/17/23  Yes  [name], [name], MD  Cyanocobalamin (VITAMIN B 12 PO) Take 500 mcg by mouth daily.   Yes [provider]  Ferrous Gluconate  324 (37.5 Fe) MG TABS Take 324 mg by mouth every other day.   Yes [provider]  furosemide  (LASIX ) 80 MG tablet TAKE ONE TABLET BY MOUTH DAILY 10/21/22  Yes Maudine Sos, MD  LORazepam  (ATIVAN ) 0.5 MG tablet Take 1 tablet (0.5 mg total) by mouth at bedtime as needed for anxiety. 06/17/23  Yes [name], [name], MD  magnesium  oxide (MAG-OX) 400 (241.3 Mg) MG tablet Take 0.5 tablets (200 mg total) by mouth daily. Patient taking differently: Take 400 mg by mouth daily. 05/12/20  Yes [place], Eric J, DO  metoprolol  succinate (TOPROL -XL) 25 MG 24 hr tablet TAKE ONE TABLET BY MOUTH DAILY 04/07/23  Yes [name], [name], MD  Multiple Vitamin (MULTIVITAMIN WITH MINERALS) TABS tablet Take 1 tablet by mouth daily.   Yes [provider]  pantoprazole  (PROTONIX ) 40 MG tablet TAKE ONE TABLET BY MOUTH ONCE DAILY 12/16/22  Yes [name], [name], MD  fluticasone  (FLONASE ) 50 MCG/ACT nasal spray Place 2 sprays into both nostrils daily. Patient not taking: Reported on 07/01/2023 10/25/21   Aida House, MD  ondansetron  (ZOFRAN ) 4 MG tablet Take 1 tablet (4 mg total) by mouth every 8 (eight) hours as needed for nausea or vomiting. Patient not taking: Reported on 07/01/2023 10/25/21   Aida House, MD  spironolactone  (ALDACTONE ) 25 MG tablet Take 1 tablet (25 mg total) by mouth daily. Patient not taking: Reported on 07/01/2023 10/04/22 09/29/23  Kraig [place], MD    Physical Exam: BP 92/62 (BP Location: Left Arm)   Pulse 65   Temp 98 F (36.7 C)   Resp 16   Ht 5' (1.524 m)   Wt 71.2 kg   SpO2 92%   BMI 30.66 kg/m  General: Pleasant, but slightly restless elderly woman laying in bed. No acute distress. HEENT: [place]/AT. Anicteric sclera CV: RRR. No murmurs, rubs, or gallops. No LE edema Pulmonary: Lungs CTAB. Normal effort. No wheezing or rales. Abdominal:  Soft, nontender, nondistended. Normal bowel sounds. Extremities: Palpable radial and DP pulses. Normal ROM. Skin: Warm and dry. No obvious rash or lesions. Neuro: A&Ox3. Mild tremors of the hands. Moves all extremities. Normal sensation to light touch. Psych: Anxious mood         Labs on Admission:  Basic Metabolic Panel: Recent Labs  Lab 07/01/23 1323 07/01/23 1616  NA 133* 132*  K 4.2 4.2  CL 93* 94*  CO2 18* 23  GLUCOSE 145* 100*  BUN 18 17  CREATININE 0.81 0.68  CALCIUM  10.3 9.8  MG 1.3* 2.2   Liver Function Tests: Recent Labs  Lab 07/01/23 1323 07/01/23 1616  AST 63* 56*  ALT 34 29  ALKPHOS 159* 143*  BILITOT 1.1 1.1  PROT 7.5 6.6  ALBUMIN 4.2 4.1   No results for  input(s): "LIPASE", "AMYLASE" in the last 168 hours. No results for input(s): "AMMONIA" in the last 168 hours. CBC: Recent Labs  Lab 07/01/23 1323  WBC 6.9  NEUTROABS 6.0  HGB 13.9  HCT 39.4  MCV 102.9*  PLT 132*   Cardiac Enzymes: No results for input(s): "CKTOTAL", "CKMB", "CKMBINDEX", "TROPONINI" in the last 168 hours. BNP (last 3 results) No results for input(s): "BNP" in the last 8760 hours.  ProBNP (last 3 results) No results for input(s): "PROBNP" in the last 8760 hours.  CBG: No results for input(s): "GLUCAP" in the last 168 hours.  Radiological Exams on Admission: No results found. Assessment/Plan Linda Glass is a 89 y.o. female with medical history significant for or anxiety, A-fib on Eliquis , chronic diastolic HF, HLD, HTN and arthritis who presented to the [facility] for evaluation of tremors.   # Tremors - Elderly patient on Celexa  and Ativan  presenting with acute onset of bilateral tremors - Consulted tremors still unclear but likely secondary to benzo withdrawal - Reports improvement in tremors after receiving Ativan  in the ED - Admit for close monitoring - Resume home Ativan  - PT/OT eval and treat - Fall precautions  # Persistent A-fib - HR  stable in the 60s to 80s - Continue Eliquis  and Toprol -XL - Telemetry  # Chronic diastolic HF - Patient euvolemic on exam - Resume home Lasix  tomorrow  # HTN - BP elevated with SBP in the 140s to 180s - Continue amlodipine  and Toprol -XL - As needed IV hydralazine  for SBP >180  # Elevated alk phos # Transaminitis - Admission labs show AST/ALT 63/34 and alk phos 159, mild improvement to 56/29 and 143 - Patient reports drinking 3-4 glasses of wine weekly. -The 2:1 ratio of AST/ALT likely indicate EtOH abuse as the cause of her transaminitis - Check RUQ U/S - Trend LFTs  # Anxiety - Continue Celexa   # HLD - Continue atorvastatin   # GERD - Continue Protonix   DVT prophylaxis: Eliquis     Code Status: Prior  Consults called: None  Family Communication: No family at bedside  Severity of Illness: The appropriate patient status for this patient is OBSERVATION. Observation status is judged to be reasonable and necessary in order to provide the required intensity of service to ensure the patient's safety. The patient's presenting symptoms, physical exam findings, and initial radiographic and laboratory data in the context of their medical condition is felt to place them at decreased risk for further clinical deterioration. Furthermore, it is anticipated that the patient will be medically stable for discharge from the hospital within 2 midnights of admission.   Level of care: Telemetry   This record has been created using Conservation officer, historic buildings. Errors have been sought and corrected, but may not always be located. Such creation errors do not reflect on the standard of care.   Vita Grip, MD 07/01/2023, 9:35 PM [facility] Pager: [phone] Isaiah 41:10   If 7PM-7AM, please contact night-coverage [email or web address] Password [email or web address]

## 2023-07-01 NOTE — ED Notes (Addendum)
 Pt urine obtained by bedpan due to pt's inability to walk/stand.

## 2023-07-02 ENCOUNTER — Observation Stay (HOSPITAL_COMMUNITY)

## 2023-07-02 DIAGNOSIS — R251 Tremor, unspecified: Secondary | ICD-10-CM | POA: Diagnosis not present

## 2023-07-02 DIAGNOSIS — R7989 Other specified abnormal findings of blood chemistry: Secondary | ICD-10-CM

## 2023-07-02 DIAGNOSIS — R748 Abnormal levels of other serum enzymes: Secondary | ICD-10-CM

## 2023-07-02 DIAGNOSIS — E872 Acidosis, unspecified: Secondary | ICD-10-CM | POA: Diagnosis not present

## 2023-07-02 LAB — COMPREHENSIVE METABOLIC PANEL WITH GFR
ALT: 28 U/L (ref 0–44)
AST: 52 U/L — ABNORMAL HIGH (ref 15–41)
Albumin: 3.5 g/dL (ref 3.5–5.0)
Alkaline Phosphatase: 116 U/L (ref 38–126)
Anion gap: 14 (ref 5–15)
BUN: 11 mg/dL (ref 8–23)
CO2: 22 mmol/L (ref 22–32)
Calcium: 9.2 mg/dL (ref 8.9–10.3)
Chloride: 92 mmol/L — ABNORMAL LOW (ref 98–111)
Creatinine, Ser: 0.44 mg/dL (ref 0.44–1.00)
GFR, Estimated: >60 mL/min (ref >60)
Glucose, Bld: 89 mg/dL (ref 70–99)
Potassium: 3.3 mmol/L — ABNORMAL LOW (ref 3.5–5.1)
Sodium: 128 mmol/L — ABNORMAL LOW (ref 135–145)
Total Bilirubin: 2.1 mg/dL — ABNORMAL HIGH (ref 0.0–1.2)
Total Protein: 6.8 g/dL (ref 6.5–8.1)

## 2023-07-02 LAB — CBC
HCT: 39.7 % (ref 36.0–46.0)
Hemoglobin: 13.2 g/dL (ref 12.0–15.0)
MCH: 35.9 pg — ABNORMAL HIGH (ref 26.0–34.0)
MCHC: 33.2 g/dL (ref 30.0–36.0)
MCV: 107.9 fL — ABNORMAL HIGH (ref 80.0–100.0)
Platelets: 120 10*3/uL — ABNORMAL LOW (ref 150–400)
RBC: 3.68 MIL/uL — ABNORMAL LOW (ref 3.87–5.11)
RDW: 13.7 % (ref 11.5–15.5)
WBC: 6.2 10*3/uL (ref 4.0–10.5)
nRBC: 0 % (ref 0.0–0.2)

## 2023-07-02 LAB — ETHANOL: Alcohol, Ethyl (B): <15 mg/dL (ref <15)

## 2023-07-02 LAB — PHOSPHORUS: Phosphorus: 3.1 mg/dL (ref 2.5–4.6)

## 2023-07-02 LAB — VITAMIN B12: Vitamin B-12: 763 pg/mL (ref 180–914)

## 2023-07-02 LAB — C DIFFICILE QUICK SCREEN W PCR REFLEX
C Diff antigen: NEGATIVE
C Diff interpretation: NOT DETECTED
C Diff toxin: NEGATIVE

## 2023-07-02 LAB — FOLATE: Folate: >40 ng/mL (ref >5.9)

## 2023-07-02 LAB — TSH: TSH: 3.357 u[IU]/mL (ref 0.350–4.500)

## 2023-07-02 LAB — MAGNESIUM: Magnesium: 1.8 mg/dL (ref 1.7–2.4)

## 2023-07-02 MED ORDER — LOPERAMIDE HCL 2 MG PO CAPS: 2.0000 mg | ORAL_CAPSULE | Freq: Three times a day (TID) | ORAL | Status: DC | PRN

## 2023-07-02 MED ORDER — THIAMINE MONONITRATE 100 MG PO TABS
100.0000 mg | ORAL_TABLET | Freq: Every day | ORAL | Status: DC
  Administered 2023-07-02 – 2023-07-07 (×6): 100 mg via ORAL
  Filled 2023-07-02 (×6): qty 1

## 2023-07-02 MED ORDER — LORAZEPAM 1 MG PO TABS
1.0000 mg | ORAL_TABLET | ORAL | Status: DC | PRN
  Administered 2023-07-02: 1 mg via ORAL
  Filled 2023-07-02: qty 1

## 2023-07-02 MED ORDER — THIAMINE HCL 100 MG/ML IJ SOLN
100.0000 mg | Freq: Every day | INTRAMUSCULAR | Status: DC
  Filled 2023-07-02: qty 2

## 2023-07-02 MED ORDER — HYDRALAZINE HCL 20 MG/ML IJ SOLN
10.0000 mg | Freq: Four times a day (QID) | INTRAMUSCULAR | Status: DC | PRN
  Administered 2023-07-02: 10 mg via INTRAVENOUS
  Filled 2023-07-02: qty 1

## 2023-07-02 MED ORDER — ONDANSETRON HCL 4 MG PO TABS: 4.0000 mg | ORAL_TABLET | Freq: Four times a day (QID) | ORAL | Status: DC | PRN

## 2023-07-02 MED ORDER — SENNOSIDES-DOCUSATE SODIUM 8.6-50 MG PO TABS: 1.0000 | ORAL_TABLET | Freq: Every evening | ORAL | Status: DC | PRN

## 2023-07-02 MED ORDER — ACETAMINOPHEN 650 MG RE SUPP: 650.0000 mg | Freq: Four times a day (QID) | RECTAL | Status: DC | PRN

## 2023-07-02 MED ORDER — FOLIC ACID 1 MG PO TABS
1.0000 mg | ORAL_TABLET | Freq: Every day | ORAL | Status: DC
  Administered 2023-07-02 – 2023-07-07 (×6): 1 mg via ORAL
  Filled 2023-07-02 (×6): qty 1

## 2023-07-02 MED ORDER — ACETAMINOPHEN 325 MG PO TABS
650.0000 mg | ORAL_TABLET | Freq: Four times a day (QID) | ORAL | Status: DC | PRN
  Administered 2023-07-02 – 2023-07-06 (×7): 650 mg via ORAL
  Filled 2023-07-02 (×8): qty 2

## 2023-07-02 MED ORDER — ONDANSETRON HCL 4 MG/2ML IJ SOLN: 4.0000 mg | Freq: Four times a day (QID) | INTRAMUSCULAR | Status: DC | PRN

## 2023-07-02 MED ORDER — LORAZEPAM 2 MG/ML IJ SOLN: 1.0000 mg | INTRAMUSCULAR | Status: DC | PRN

## 2023-07-02 NOTE — Evaluation (Signed)
 Physical Therapy Evaluation Patient Details Name: Linda Glass MRN: [id number] DOB: 1933/07/01 Today's Date: 07/02/2023  History of Present Illness  89 year old female presenting to [facility] ED on 5/6 with tremors.  PMH: T8 and T12 fx,AKI, HLD, HTN, PAF, anemia,persistent A fib, anxiety.  Clinical Impression  On eval, pt required Min A for mobility. She performed sit to stand several times and took a few steps in room with a RW. She is quite impulsive and at risk for falls when mobilizing. She tends to self limit activity and voices a fear of falling. Discussed d/c plan-pt is adamant she is not going to a SNF-has been before and did not have good experiences. Encouraged pt to try to arrange 24/7 supervision/assist until she returns to her baseline. Will follow pt during this hospital stay. Will recommend HHPT f/u-pt is agreeable to this . She may also benefit from a home health aide.         If plan is discharge home, recommend the following: A little help with walking and/or transfers;A little help with bathing/dressing/bathroom;Assistance with cooking/housework;Assist for transportation;Help with stairs or ramp for entrance   Can travel by private vehicle        Equipment Recommendations None recommended by PT  Recommendations for Other Services       Functional Status Assessment Patient has had a recent decline in their functional status and demonstrates the ability to make significant improvements in function in a reasonable and predictable amount of time.     Precautions / Restrictions Precautions Precautions: Fall Restrictions Weight Bearing Restrictions Per Provider Order: No      Mobility  Bed Mobility               General bed mobility comments: oob in recliner    Transfers Overall transfer level: Needs assistance Equipment used: Rolling walker (2 wheels) Transfers: Sit to/from Stand Sit to Stand: Contact guard assist           General transfer  comment: Impulsive. Cues for safety, hand placement. Fall risk.    Ambulation/Gait Ambulation/Gait assistance: Min assist Gait Distance (Feet): 3 Feet (x2) Assistive device: Rolling walker (2 wheels)         General Gait Details: Assist to stabilize pt. Pt moves quickly with poor safety at times. She self limits ambulation due to fear fo falling. Fall risk.  Stairs            Wheelchair Mobility     Tilt Bed    Modified Rankin (Stroke Patients Only)       Balance Overall balance assessment: Needs assistance         Standing balance support: Bilateral upper extremity supported, During functional activity, Reliant on assistive device for balance Standing balance-Leahy Scale: Poor                               Pertinent Vitals/Pain Pain Assessment Pain Assessment: No/denies pain    Home Living Family/patient expects to be discharged to:: Private residence Living Arrangements: Alone Available Help at Discharge: Neighbor;Available PRN/intermittently Type of Home: House Home Access: Level entry       Home Layout: Able to live on main level with bedroom/bathroom;Two level Home Equipment: Agricultural consultant (2 wheels);BSC/3in1      Prior Function Prior Level of Function : Independent/Modified Independent             Mobility Comments: uses a RW for ambulation. ADLs  Comments: Pt was independent with ADLs and driving; she had hired assist for cleaning.     Extremity/Trunk Assessment   Upper Extremity Assessment Upper Extremity Assessment: Defer to OT evaluation    Lower Extremity Assessment Lower Extremity Assessment: Generalized weakness    Cervical / Trunk Assessment Cervical / Trunk Assessment: Normal  Communication   Communication Communication: Impaired Factors Affecting Communication: Hearing impaired    Cognition Arousal: Alert Behavior During Therapy: WFL for tasks assessed/performed   PT - Cognitive impairments: No  apparent impairments                         Following commands: Intact       Cueing Cueing Techniques: Verbal cues     General Comments      Exercises     Assessment/Plan    PT Assessment Patient needs continued PT services  PT Problem List Decreased strength;Decreased range of motion;Decreased activity tolerance;Decreased balance;Decreased mobility;Decreased knowledge of use of DME;Decreased safety awareness       PT Treatment Interventions DME instruction;Gait training;Functional mobility training;Therapeutic activities;Therapeutic exercise;Patient/family education;Balance training;Stair training    PT Goals (Current goals can be found in the Care Plan section)  Acute Rehab PT Goals Patient Stated Goal: home! PT Goal Formulation: With patient Time For Goal Achievement: 07/16/23 Potential to Achieve Goals: Good    Frequency Min 3X/week     Co-evaluation               AM-PAC PT "6 Clicks" Mobility  Outcome Measure Help needed turning from your back to your side while in a flat bed without using bedrails?: A Little Help needed moving from lying on your back to sitting on the side of a flat bed without using bedrails?: A Little Help needed moving to and from a bed to a chair (including a wheelchair)?: A Little Help needed standing up from a chair using your arms (e.g., wheelchair or bedside chair)?: A Little Help needed to walk in hospital room?: A Lot Help needed climbing 3-5 steps with a railing? : A Lot 6 Click Score: 16    End of Session Equipment Utilized During Treatment: Gait belt Activity Tolerance: Patient tolerated treatment well Patient left: in chair;with call bell/phone within reach;with chair alarm set;with family/visitor present   PT Visit Diagnosis: Unsteadiness on feet (R26.81);Muscle weakness (generalized) (M62.81);Difficulty in walking, not elsewhere classified (R26.2)    Time: 5621-3086 PT Time Calculation (min) (ACUTE ONLY):  14 min   Charges:   PT Evaluation $PT Eval Low Complexity: 1 Low   PT General Charges $$ ACUTE PT VISIT: 1 Visit           Tanda Falter, PT Acute Rehabilitation  Office: [phone]

## 2023-07-02 NOTE — Evaluation (Signed)
 Occupational Therapy Evaluation Patient Details Name: Linda Glass MRN: [id number] DOB: Nov 07, 1933 Today's Date: 07/02/2023   History of Present Illness   patient  is an 89 year old female presenting to [facility] ED on 5/6 with tremors.  PMH: T8 and T12 fx,AKI, HLD, HTN, PAF, anemia,persistent A fib, anxiety.     Clinical Impressions Patient is a 89 year old female who was admitted for above. Patient was living at home alone with plan for children to come live near her in the next week or two per patient report. Patient currently was anxious during session with impulsive movement and patient unsteady with transition to recliner. Patient pushing walker away even with education on using it for balance and support provided. Patient was noted to have decreased functional activity tolerance, decreased endurance, decreased standing balance, decreased safety awareness, and decreased knowledge of AD/AE impacting participation in ADLs. Plan is for patient to d/c home with children support and [facility] services when medically stable.      If plan is discharge home, recommend the following:   A little help with walking and/or transfers;A little help with bathing/dressing/bathroom;Assistance with cooking/housework;Direct supervision/assist for medications management;Assist for transportation;Help with stairs or ramp for entrance;Direct supervision/assist for financial management     Functional Status Assessment   Patient has had a recent decline in their functional status and demonstrates the ability to make significant improvements in function in a reasonable and predictable amount of time.     Equipment Recommendations   None recommended by OT      Precautions/Restrictions   Precautions Precautions: Fall Restrictions Weight Bearing Restrictions Per Provider Order: No     Mobility Bed Mobility Overal bed mobility: Needs Assistance Bed Mobility: Supine to Sit     Supine to sit:  Supervision     General bed mobility comments: cues to slow down            ADL either performed or assessed with clinical judgement   ADL Overall ADL's : Needs assistance/impaired Eating/Feeding: Modified independent   Grooming: Sitting;Wash/dry face;Set up   Upper Body Bathing: Sitting;Set up   Lower Body Bathing: Sitting/lateral leans;Set up;Supervison/ safety Lower Body Bathing Details (indicate cue type and reason): bends to reach feet simulated task Upper Body Dressing : Sitting;Set up   Lower Body Dressing: Sitting/lateral leans;Supervision/safety;Set up   Toilet Transfer: Minimal assistance;Stand-pivot;Rolling walker (2 wheels) Toilet Transfer Details (indicate cue type and reason): patient pushing RW aside Toileting- Clothing Manipulation and Hygiene: Sitting/lateral lean;Moderate assistance                Pertinent Vitals/Pain Pain Assessment Pain Assessment: Faces Faces Pain Scale: Hurts a little bit Pain Location: neck pain Pain Descriptors / Indicators: Grimacing Pain Intervention(s): Monitored during session, Patient requesting pain meds-RN notified     Extremity/Trunk Assessment Upper Extremity Assessment Upper Extremity Assessment: Overall WFL for tasks assessed (noted to have slight tremor in BUE but did not effects ADL preformance.)           Communication     Cognition Arousal: Alert Behavior During Therapy: WFL for tasks assessed/performed Cognition: No apparent impairments             OT - Cognition Comments: impulsive with movements.                 Following commands: Intact                  Home Living Family/patient expects to be discharged to:: Private residence  Living Arrangements: Alone Available Help at Discharge: Neighbor;Available PRN/intermittently Type of Home: House Home Access: Level entry     Home Layout: Able to live on main level with bedroom/bathroom;Two level               Home  Equipment: Agricultural consultant (2 wheels);BSC/3in1   Additional Comments: reported daughter and son in law are coming to live near her to assist as needed starting saturday      Prior Functioning/Environment Prior Level of Function : Independent/Modified Independent             Mobility Comments: She uses a RW for ambulation. ADLs Comments: Pt was independent with ADLs and driving; she had hired assist for cleaning.    OT Problem List: Impaired balance (sitting and/or standing);Decreased knowledge of precautions;Decreased safety awareness;Decreased activity tolerance;Decreased knowledge of use of DME or AE   OT Treatment/Interventions: Self-care/ADL training;DME and/or AE instruction;Balance training;Therapeutic activities;Therapeutic exercise;Energy conservation;Patient/family education      OT Goals(Current goals can be found in the care plan section)   Acute Rehab OT Goals Patient Stated Goal: to go home with kids OT Goal Formulation: With patient Time For Goal Achievement: 07/16/23 Potential to Achieve Goals: Fair   OT Frequency:  Min 2X/week       AM-PAC OT "6 Clicks" Daily Activity     Outcome Measure Help from another person eating meals?: None Help from another person taking care of personal grooming?: A Little Help from another person toileting, which includes using toliet, bedpan, or urinal?: A Lot Help from another person bathing (including washing, rinsing, drying)?: A Lot Help from another person to put on and taking off regular upper body clothing?: A Little Help from another person to put on and taking off regular lower body clothing?: A Lot 6 Click Score: 16   End of Session Equipment Utilized During Treatment: Rolling walker (2 wheels) Nurse Communication: Other (comment) (in room at end of session to give meds)  Activity Tolerance: Patient tolerated treatment well Patient left: in chair;with call bell/phone within reach;with chair alarm set  OT Visit  Diagnosis: Unsteadiness on feet (R26.81);Other abnormalities of gait and mobility (R26.89);History of falling (Z91.81)                Time: 0912-0929 OT Time Calculation (min): 17 min Charges:  OT General Charges $OT Visit: 1 Visit OT Evaluation $OT Eval Low Complexity: 1 Low  Marykathleen Russi OTR/L, MS Acute Rehabilitation Department Office# [phone]   Jame Maze 07/02/2023, 11:17 AM

## 2023-07-02 NOTE — Care Management Obs Status (Signed)
 MEDICARE OBSERVATION STATUS NOTIFICATION   Patient Details  Name: Linda Glass MRN: [id number] Date of Birth: 16-Mar-1933   Medicare Observation Status Notification Given:  Yes    MahabirThersia Flax, RN 07/02/2023, 2:16 PM

## 2023-07-02 NOTE — TOC Initial Note (Signed)
 Transition of Care [facility]) - Initial/Assessment Note    Patient Details  Name: Linda Glass MRN: [id number] Date of Birth: 07/09/1933  Transition of Care [facility]) CM/SW Contact:    Ruben Corolla, RN Phone Number: 07/02/2023, 10:11 AM  Clinical Narrative: d/c plan home. Family to asst @ home. Await PT recc.                  Expected Discharge Plan: Home w Home Health Services Barriers to Discharge: Continued Medical Work up   Patient Goals and CMS Choice Patient states their goals for this hospitalization and ongoing recovery are:: Home CMS Medicare.gov Compare Post Acute Care list provided to:: Patient Represenative (must comment) (Annette(dtr)) Choice offered to / list presented to : Adult Children [place] ownership interest in [facility]provided to:: Adult Children    Expected Discharge Plan and Services   Discharge Planning Services: CM Consult Post Acute Care Choice: Home Health Living arrangements for the past 2 months: Single Family Home                                      Prior Living Arrangements/Services Living arrangements for the past 2 months: Single Family Home Lives with:: Self Patient language and need for interpreter reviewed:: Yes Do you feel safe going back to the place where you live?: Yes      Need for Family Participation in Patient Care: No (Comment) Care giver support system in place?: Yes (comment) Current home services: DME (rw,3n1) Criminal Activity/Legal Involvement Pertinent to Current Situation/Hospitalization: No - Comment as needed  Activities of Daily Living   ADL Screening (condition at time of admission) Independently performs ADLs?: Yes (appropriate for developmental age) Is the patient deaf or have difficulty hearing?: Yes Does the patient have difficulty seeing, even when wearing glasses/contacts?: No Does the patient have difficulty concentrating, remembering, or making decisions?: No  Permission  Sought/Granted Permission sought to share information with : Case Manager Permission granted to share information with : Yes, Verbal Permission Granted  Share Information with NAME: Case Manager           Emotional Assessment Appearance:: Appears stated age Attitude/Demeanor/Rapport: Gracious Affect (typically observed): Accepting Orientation: : Oriented to Self, Oriented to Place, Oriented to  Time, Oriented to Situation Alcohol / Substance Use: Not Applicable Psych Involvement: No (comment)  Admission diagnosis:  Hypomagnesemia [E83.42] Tremor [R25.1] Metabolic acidosis [E87.20] Patient Active Problem List   Diagnosis Date Noted   Hypomagnesemia 07/02/2023   Elevated LFTs 07/02/2023   High alkaline phosphatase 07/02/2023   Tremor 07/01/2023   Chronic diastolic heart failure (HCC) 10/22/2022   Thrombocytopenia (HCC) 10/22/2022   UTI (urinary tract infection) 10/03/2022   Fall 10/02/2022   Syncope 10/02/2022   Acute UTI (urinary tract infection) 10/02/2022   Hypercoagulable state (HCC) 07/27/2022   Cerumen impaction 10/11/2021   Hearing loss 10/11/2021   AKI (acute kidney injury) (HCC) 09/17/2021   Diarrhea 09/17/2021   Hyponatremia 09/17/2021   Metabolic acidosis 09/17/2021   Traumatic compression fracture of T8 vertebra (HCC) 09/17/2021   Breast mass, right 09/17/2021   Ovarian cyst 09/17/2021   Colonic mass 03/12/2021   Atherosclerosis of aorta (HCC) 01/05/2021   Multiple gastric ulcers    Cecum mass    2019 novel coronavirus disease (COVID-19) 05/02/2020   Persistent atrial fibrillation (HCC) 05/01/2020   Hypertension, essential, benign 07/05/2019   Anxiety disorder 07/05/2019  Insomnia disorder 08/21/2016   Class 1 obesity 08/21/2016   Hyperlipidemia, mixed 08/21/2016   PCP:  [name], [name], MD Pharmacy:   [facility] [place], [place] - [address] [place] [address] Phone: [phone] Fax:  [phone]     Social Drivers of Health (SDOH) Social History: SDOH Screenings   Food Insecurity: No Food Insecurity (07/01/2023)  Housing: Low Risk  (07/01/2023)  Transportation Needs: No Transportation Needs (07/01/2023)  Utilities: Not At Risk (07/01/2023)  Alcohol Screen: Low Risk  (08/21/2022)  Depression (PHQ2-9): Low Risk  (10/22/2022)  Financial Resource Strain: Low Risk  (08/21/2022)  Physical Activity: Insufficiently Active (08/21/2022)  Social Connections: Moderately Isolated (07/01/2023)  Stress: No Stress Concern Present (08/21/2022)  Tobacco Use: Low Risk  (07/01/2023)   SDOH Interventions:     Readmission Risk Interventions     No data to display

## 2023-07-02 NOTE — Progress Notes (Signed)
 PT Cancellation Note  Patient Details Name: Linda Glass MRN: [id number] DOB: 1933/11/13   Cancelled Treatment:    Reason Eval/Treat Not Completed:  Attempted PT eval-pt declined to participate at this time.Will check back as schedule allows.    Tanda Falter, PT Acute Rehabilitation  Office: [phone]

## 2023-07-02 NOTE — Progress Notes (Signed)
 [facility]                                                                              Linda Glass, is a 89 y.o. female, DOB - [birth year], ZOX:[id number] Admit date - 07/01/2023    Outpatient Primary MD for the patient is [name], [name], MD  LOS - 0  days  Chief Complaint  Patient presents with   Tremors       Brief summary   Patient is a 89 year old female with anxiety, A-fib on Eliquis , chronic diastolic CHF, hyperlipidemia, hypertension, arthritis, who initially presented to [facility] ED on 5/6 for sudden shaking all over/tremors.  Patient reported that since a day before the admission, she had felt anxious and tremulous in upper and lower extremities.  Her tremors worsened on the morning of admission and she had trouble walking and her neighbor brought her to the ED.  Patient reported that her outpatient PCP was currently weaning her Celexa , she recently started alternating 20 mg and 10 mg of Celexa  daily for 2 weeks with plan to remain on 10 mg dose after 2 weeks.  She is also on Ativan  at night to help her sleep and reported taking Ativan  at night before prior to arrival to ED. Patient was unable to ambulate in the ED hence was admitted for further workup.  Assessment & Plan     Tremors/sudden onset of shaking - Elderly patient on Celexa  and Ativan  presenting with acute onset of bilateral hand and leg tremors, unclear cause of her tremors could be secondary to withdrawals versus worsening anxiety.  Per patient, she only drinks 3 to 4 glasses of wine in a week, not every day -Placed on CIWA protocol with Ativan  - Continue thiamine, folate, MVI - B12, folate, TSH within normal limits, B1 in process - PT evaluation, fall precautions -Obtain CT head  - If no improvement by tomorrow, will consult neurology     Persistent A-fib - Heart rate controlled, continue Toprol -XL -Continue Eliquis     Chronic diastolic HF - Euvolemic, continue home dose of  Lasix , 80 mg daily - Continue Toprol -XL, amlodipine      Hypertension - BP elevated, continue Toprol -XL, amlodipine , furosemide       Elevated alk phos,  Transaminitis, thrombocytopenia - Initial CMP showed AST/ALT 63/34 and alk phos 159, thrombocytopenia, suggesting alcoholic liver disease - LFTs improving, AST> ALT.  Patient reports drinking 3-4 glasses of wine weekly. - The 2:1 ratio of AST/ALT likely indicate EtOH use as the cause of her transaminitis - Right upper quadrant ultrasound shows a chronic cholelithiasis, heterogenous slightly nodular appearing liver could correlate with hepatocellular disease -Counseled on on alcohol cessation - Will check INR in a.m.    Anxiety - Continue Celexa , Ativan  per CIWA protocol, takes Ativan  at night for sleep   HLD - Continue atorvastatin     GERD - Continue Protonix    Diarrhea -C. difficile panel negative, GI pathogen panel pending - Placed on Imodium  as needed   Obesity class I Estimated body mass index is 30.66 kg/m as calculated from the following:   Height as of this encounter: 5' (1.524 m).   Weight  as of this encounter: 71.2 kg.  Code Status: DNR DVT Prophylaxis:   apixaban  (ELIQUIS ) tablet 5 mg   Level of Care: Level of care: Telemetry Family Communication: Updated patient Disposition Plan:      Remains inpatient appropriate:      Procedures:    Consultants:     Antimicrobials:   Anti-infectives (From admission, onward)    None          Medications  amLODipine   10 mg Oral Daily   apixaban   5 mg Oral BID   atorvastatin   40 mg Oral Daily   citalopram   10 mg Oral Daily   cyanocobalamin  500 mcg Oral Daily   ferrous gluconate   324 mg Oral QODAY   furosemide   80 mg Oral Daily   magnesium  oxide  400 mg Oral Daily   metoprolol  succinate  25 mg Oral Daily   multivitamin with minerals  1 tablet Oral Daily   pantoprazole   40 mg Oral Daily      Subjective:   Linda Glass was seen and  examined today.  On examination, was talking on the phone prior to my encounter.  Alert and oriented but still somewhat tremulous.  No nausea vomiting, chest pain, shortness of breath, fevers.    Objective:   Vitals:   07/01/23 2235 07/02/23 0346 07/02/23 0618 07/02/23 0833  BP: (!) 163/67 (!) 170/65 (!) 168/64 (!) 160/68  Pulse: (!) 56 70 (!) 57 78  Resp:  18    Temp: 98.6 F (37 C) 97.7 F (36.5 C)  97.7 F (36.5 C)  TempSrc: Axillary   Oral  SpO2:  96%  96%  Weight:      Height:        Intake/Output Summary (Last 24 hours) at 07/02/2023 1248 Last data filed at 07/02/2023 0835 Gross per 24 hour  Intake 120 ml  Output --  Net 120 ml     Wt Readings from Last 3 Encounters:  07/01/23 71.2 kg  10/22/22 72.6 kg  10/11/22 70.8 kg     Exam General: Alert and oriented x 3, NAD Cardiovascular: S1 S2 auscultated,  RRR Respiratory: Clear to auscultation bilaterally Gastrointestinal: Soft, nontender, nondistended, + bowel sounds Ext: no pedal edema bilaterally, tremulous Neuro: Strength 5/5 upper and lower extremities bilaterally Psych: appears anxious    Data Reviewed:  I have personally reviewed following labs    CBC Lab Results  Component Value Date   WBC 6.2 07/02/2023   RBC 3.68 (L) 07/02/2023   HGB 13.2 07/02/2023   HCT 39.7 07/02/2023   MCV 107.9 (H) 07/02/2023   MCH 35.9 (H) 07/02/2023   PLT 120 (L) 07/02/2023   MCHC 33.2 07/02/2023   RDW 13.7 07/02/2023   LYMPHSABS 0.4 (L) 07/01/2023   MONOABS 0.4 07/01/2023   EOSABS 0.0 07/01/2023   BASOSABS 0.0 07/01/2023     Last metabolic panel Lab Results  Component Value Date   NA 128 (L) 07/02/2023   K 3.3 (L) 07/02/2023   CL 92 (L) 07/02/2023   CO2 22 07/02/2023   BUN 11 07/02/2023   CREATININE 0.44 07/02/2023   GLUCOSE 89 07/02/2023   GFRNONAA >60 07/02/2023   GFRAA 101 02/11/2020   CALCIUM  9.2 07/02/2023   PHOS 3.1 07/02/2023   PROT 6.8 07/02/2023   ALBUMIN 3.5 07/02/2023   BILITOT 2.1 (H)  07/02/2023   ALKPHOS 116 07/02/2023   AST 52 (H) 07/02/2023   ALT 28 07/02/2023   ANIONGAP 14 07/02/2023  CBG (last 3)  No results for input(s): "GLUCAP" in the last 72 hours.    Coagulation Profile: No results for input(s): "INR", "PROTIME" in the last 168 hours.   Radiology Studies: I have personally reviewed the imaging studies  US  Abdomen Limited RUQ (LIVER/GB) Result Date: 07/02/2023 CLINICAL DATA:  Elevated LFTs EXAM: ULTRASOUND ABDOMEN LIMITED RIGHT UPPER QUADRANT COMPARISON:  CT abdomen and pelvis September 17, 2021 FINDINGS: Gallbladder: Multiple gallstones correlate with chronic cholelithiasis Common bile duct: Diameter: 7.3 mm, normal for patient's age Liver: Echogenic heterogeneous slightly nodular appearing liver could correlate with hepatocellular disease portal vein is patent on color Doppler imaging with normal direction of blood flow towards the liver. Other: None. IMPRESSION: *Chronic cholelithiasis. *Heterogeneous slightly nodular appearing liver could correlate with hepatocellular disease. Electronically Signed   By: Fredrich Jefferson M.D.   On: 07/02/2023 08:54       Aissatou Fronczak M.D. [facility] 07/02/2023, 12:48 PM  Available via Epic secure chat 7am-7pm After 7 pm, please refer to night coverage provider listed on amion.

## 2023-07-03 DIAGNOSIS — E872 Acidosis, unspecified: Secondary | ICD-10-CM | POA: Diagnosis not present

## 2023-07-03 DIAGNOSIS — R251 Tremor, unspecified: Secondary | ICD-10-CM | POA: Diagnosis not present

## 2023-07-03 DIAGNOSIS — R748 Abnormal levels of other serum enzymes: Secondary | ICD-10-CM | POA: Diagnosis not present

## 2023-07-03 LAB — COMPREHENSIVE METABOLIC PANEL WITH GFR
ALT: 25 U/L (ref 0–44)
AST: 43 U/L — ABNORMAL HIGH (ref 15–41)
Albumin: 3.4 g/dL — ABNORMAL LOW (ref 3.5–5.0)
Alkaline Phosphatase: 106 U/L (ref 38–126)
Anion gap: 12 (ref 5–15)
BUN: 9 mg/dL (ref 8–23)
CO2: 25 mmol/L (ref 22–32)
Calcium: 9.3 mg/dL (ref 8.9–10.3)
Chloride: 96 mmol/L — ABNORMAL LOW (ref 98–111)
Creatinine, Ser: 0.34 mg/dL — ABNORMAL LOW (ref 0.44–1.00)
GFR, Estimated: >60 mL/min (ref >60)
Glucose, Bld: 101 mg/dL — ABNORMAL HIGH (ref 70–99)
Potassium: 3 mmol/L — ABNORMAL LOW (ref 3.5–5.1)
Sodium: 133 mmol/L — ABNORMAL LOW (ref 135–145)
Total Bilirubin: 1.7 mg/dL — ABNORMAL HIGH (ref 0.0–1.2)
Total Protein: 6.7 g/dL (ref 6.5–8.1)

## 2023-07-03 LAB — GASTROINTESTINAL PANEL BY PCR, STOOL (REPLACES STOOL CULTURE)

## 2023-07-03 LAB — CBC
HCT: 39.5 % (ref 36.0–46.0)
Hemoglobin: 13.5 g/dL (ref 12.0–15.0)
MCH: 36.2 pg — ABNORMAL HIGH (ref 26.0–34.0)
MCHC: 34.2 g/dL (ref 30.0–36.0)
MCV: 105.9 fL — ABNORMAL HIGH (ref 80.0–100.0)
Platelets: 106 10*3/uL — ABNORMAL LOW (ref 150–400)
RBC: 3.73 MIL/uL — ABNORMAL LOW (ref 3.87–5.11)
RDW: 13.6 % (ref 11.5–15.5)
WBC: 6 10*3/uL (ref 4.0–10.5)
nRBC: 0 % (ref 0.0–0.2)

## 2023-07-03 LAB — PROTIME-INR
INR: 1.3 — ABNORMAL HIGH (ref 0.8–1.2)
Prothrombin Time: 16.6 s — ABNORMAL HIGH (ref 11.4–15.2)

## 2023-07-03 LAB — VITAMIN B1

## 2023-07-03 LAB — GLUCOSE, CAPILLARY: Glucose-Capillary: 115 mg/dL — ABNORMAL HIGH (ref 70–99)

## 2023-07-03 MED ORDER — POTASSIUM CHLORIDE CRYS ER 20 MEQ PO TBCR
40.0000 meq | EXTENDED_RELEASE_TABLET | Freq: Once | ORAL | Status: AC
  Administered 2023-07-03: 40 meq via ORAL
  Filled 2023-07-03: qty 2

## 2023-07-03 NOTE — Plan of Care (Signed)
  Problem: Clinical Measurements: Goal: Ability to maintain clinical measurements within normal limits will improve Outcome: Progressing Goal: Diagnostic test results will improve Outcome: Progressing Goal: Respiratory complications will improve Outcome: Progressing Goal: Cardiovascular complication will be avoided Outcome: Progressing   Problem: Pain Managment: Goal: General experience of comfort will improve and/or be controlled Outcome: Progressing

## 2023-07-03 NOTE — Progress Notes (Signed)
 Physical Therapy Treatment Patient Details Name: Linda Glass MRN: [id number] DOB: May 22, 1933 Today's Date: 07/03/2023   History of Present Illness 89 year old female presenting to [facility] ED on 5/6 with tremors.  PMH: T8 and T12 fx,AKI, HLD, HTN, PAF, anemia,persistent A fib, anxiety.    PT Comments  Pt agreeable to session, reports that she needs to use BSC, able to transfer with HHA from bed to commode. Impulsive, but listens to cues. Able to amb with walker in hallway x74ft at [facility]. Returns to recliner chair, ordering lunch. Some Sob noted, but pt is pleased with her abilities today.    If plan is discharge home, recommend the following: A little help with walking and/or transfers;A little help with bathing/dressing/bathroom;Assistance with cooking/housework;Assist for transportation;Help with stairs or ramp for entrance   Can travel by private vehicle        Equipment Recommendations  None recommended by PT    Recommendations for Other Services       Precautions / Restrictions Precautions Precautions: Fall Recall of Precautions/Restrictions: Intact Precaution/Restrictions Comments: enteric Restrictions Weight Bearing Restrictions Per Provider Order: No     Mobility  Bed Mobility Overal bed mobility: Needs Assistance Bed Mobility: Supine to Sit     Supine to sit: Supervision          Transfers Overall transfer level: Needs assistance Equipment used: 1 person hand held assist Transfers: Sit to/from Stand, Bed to chair/wheelchair/BSC Sit to Stand: Contact guard assist   Step pivot transfers: Contact guard assist            Ambulation/Gait Ambulation/Gait assistance: Contact guard assist Gait Distance (Feet): 30 Feet Assistive device: Rolling walker (2 wheels) Gait Pattern/deviations: Step-through pattern, Decreased stride length, Wide base of support Gait velocity: dec     General Gait Details: Pt reports walker "doesnt drive right" moderate  use of BUE on AD, requires CGA but is able to complete directional change and progressive distances today. Cues for preapring to sit in chair and maintain body-walker positioning.   Stairs             Wheelchair Mobility     Tilt Bed    Modified Rankin (Stroke Patients Only)       Balance Overall balance assessment: Needs assistance Sitting-balance support: No upper extremity supported, Feet supported Sitting balance-Leahy Scale: Fair     Standing balance support: During functional activity, Reliant on assistive device for balance Standing balance-Leahy Scale: Fair                              Communication    Cognition Arousal: Alert Behavior During Therapy: WFL for tasks assessed/performed   PT - Cognitive impairments: No apparent impairments                                Cueing    Exercises      General Comments        Pertinent Vitals/Pain Pain Assessment Pain Assessment: No/denies pain    Home Living                          Prior Function            PT Goals (current goals can now be found in the care plan section) Acute Rehab PT Goals Patient Stated Goal: home! PT Goal Formulation: With  patient Time For Goal Achievement: 07/16/23 Potential to Achieve Goals: Good Progress towards PT goals: Progressing toward goals    Frequency    Min 3X/week      PT Plan      Co-evaluation              AM-PAC PT "6 Clicks" Mobility   Outcome Measure  Help needed turning from your back to your side while in a flat bed without using bedrails?: A Little Help needed moving from lying on your back to sitting on the side of a flat bed without using bedrails?: A Little Help needed moving to and from a bed to a chair (including a wheelchair)?: A Little Help needed standing up from a chair using your arms (e.g., wheelchair or bedside chair)?: A Little Help needed to walk in hospital room?: A Little Help needed  climbing 3-5 steps with a railing? : A Lot 6 Click Score: 17    End of Session Equipment Utilized During Treatment: Gait belt Activity Tolerance: Patient tolerated treatment well Patient left: in chair;with call bell/phone within reach;with chair alarm set;with family/visitor present Nurse Communication: Mobility status PT Visit Diagnosis: Unsteadiness on feet (R26.81);Muscle weakness (generalized) (M62.81);Difficulty in walking, not elsewhere classified (R26.2)     Time: 1610-9604 PT Time Calculation (min) (ACUTE ONLY): 13 min  Charges:    $Gait Training: 8-22 mins PT General Charges $$ ACUTE PT VISIT: 1 Visit                     Darien Eden, PT Acute Rehabilitation Services Office: [phone] 07/03/2023    Serafin Dames 07/03/2023, 11:10 AM

## 2023-07-03 NOTE — Progress Notes (Signed)
 [facility]                                                                              Dawson Penton, is a 89 y.o. female, DOB - [birth year], ZOX:[id number] Admit date - 07/01/2023    Outpatient Primary MD for the patient is [name], [name], MD  LOS - 0  days  Chief Complaint  Patient presents with   Tremors       Brief summary   Patient is a 89 year old female with anxiety, A-fib on Eliquis , chronic diastolic CHF, hyperlipidemia, hypertension, arthritis, who initially presented to [facility] on 5/6 for sudden shaking all over/tremors.  Patient reported that since a day before the admission, she had felt anxious and tremulous in upper and lower extremities.  Her tremors worsened on the morning of admission and she had trouble walking and her neighbor brought her to the ED.  Patient reported that her outpatient PCP was currently weaning her Celexa , she recently started alternating 20 mg and 10 mg of Celexa  daily for 2 weeks with plan to remain on 10 mg dose after 2 weeks.  She is also on Ativan  at night to help her sleep and reported taking Ativan  at night before prior to arrival to ED. Patient was unable to ambulate in the ED hence was admitted for further workup.  Assessment & Plan     Tremors/sudden onset of shaking - Elderly patient on Celexa  and Ativan  presenting with acute onset of bilateral hand and leg tremors, unclear cause of her tremors could be secondary to withdrawals versus worsening anxiety.  Per patient, she only drinks 3 to 4 glasses of wine in a week, not every day - Placed on CIWA protocol with Ativan  - Continue thiamine, folate, MVI - B12, folate, TSH within normal limits, B1 in process - Received Ativan  last night for CIWA 6.  - Per patient, she is feeling a lot better now tremors are improving.  It appears that it probably was Celexa  and alcohol withdrawals.   - Continue Celexa  10 mg daily and ativan .  Counseled strongly on alcohol  cessation - It appears that patient had done well with Celexa  20 mg daily am and Ativan  0.5 mg at bedtime however was recently tapered from Celexa  with 20 mg every other day for 2 weeks then 10 mg daily.  She has significant anxiety, will place psych consult if she can have a good regimen without side effects or withdrawals.     Persistent A-fib - Continue Toprol -XL, HR fairly controlled -Continue Eliquis     Chronic diastolic HF - Euvolemic, continue home dose of Lasix , 80 mg daily - Continue Toprol -XL, amlodipine      Hypertension - Continue Toprol -XL, amlodipine , furosemide       Elevated alk phos,  Transaminitis, thrombocytopenia - Initial CMP showed AST/ALT 63/34 and alk phos 159, thrombocytopenia, suggesting alcoholic liver disease - LFTs improving, AST> ALT.  Patient reports drinking 3-4 glasses of wine weekly. - The 2:1 ratio of AST/ALT likely indicate EtOH use as the cause of her transaminitis - Right upper quadrant ultrasound shows a chronic cholelithiasis, heterogenous slightly nodular appearing liver could correlate with  hepatocellular disease - INR 1.3.  Counseled again on alcohol cessation.    Anxiety - Continue Celexa , Ativan  per CIWA protocol, - Prior to admission, takes Ativan  0.5 mg daily at night for sleep   HLD - Continue atorvastatin     GERD - Continue Protonix    Diarrhea -C. difficile panel negative, GI pathogen panel negative - Placed on Imodium  as needed Improving  Obesity class I Estimated body mass index is 30.66 kg/m as calculated from the following:   Height as of this encounter: 5' (1.524 m).   Weight as of this encounter: 71.2 kg.  Code Status: DNR DVT Prophylaxis:   apixaban  (ELIQUIS ) tablet 5 mg   Level of Care: Level of care: Telemetry Family Communication: Updated patient Disposition Plan:      Remains inpatient appropriate:   Likely DC home in a.m.,   Procedures:    Consultants:   Psychiatry  Antimicrobials:    Anti-infectives (From admission, onward)    None          Medications  amLODipine   10 mg Oral Daily   apixaban   5 mg Oral BID   atorvastatin   40 mg Oral Daily   citalopram   10 mg Oral Daily   cyanocobalamin  500 mcg Oral Daily   ferrous gluconate   324 mg Oral QODAY   folic acid   1 mg Oral Daily   furosemide   80 mg Oral Daily   magnesium  oxide  400 mg Oral Daily   metoprolol  succinate  25 mg Oral Daily   multivitamin with minerals  1 tablet Oral Daily   pantoprazole   40 mg Oral Daily   thiamine  100 mg Oral Daily   Or   thiamine  100 mg Intravenous Daily      Subjective:   Linda Glass was seen and examined today.  Feels much better today, received Ativan  x 2 (1 mg at 2241 on 5/7 and 0.5 mg at 12:44 AM)  Alert and oriented, tremulousness improving from admission.  Objective:   Vitals:   07/02/23 2034 07/02/23 2208 07/02/23 2331 07/03/23 0520  BP: (!) 174/72 (!) 165/70 (!) 156/66 (!) 159/71  Pulse: 65 70 71 75  Resp: 19 17  16   Temp: 98 F (36.7 C) 97.7 F (36.5 C)  97.9 F (36.6 C)  TempSrc:      SpO2: 96% 97%  97%  Weight:      Height:        Intake/Output Summary (Last 24 hours) at 07/03/2023 1229 Last data filed at 07/03/2023 0803 Gross per 24 hour  Intake --  Output 200 ml  Net -200 ml     Wt Readings from Last 3 Encounters:  07/01/23 71.2 kg  10/22/22 72.6 kg  10/11/22 70.8 kg    Physical Exam General: Alert and oriented x 3, NAD Cardiovascular: S1 S2 clear, RRR.  Respiratory: CTAB, no wheezing, rales or rhonchi Gastrointestinal: Soft, nontender, nondistended, NBS Ext: no pedal edema bilaterally, tremulousness improving Neuro: no new deficits Psych: still somewhat anxious     Data Reviewed:  I have personally reviewed following labs    CBC Lab Results  Component Value Date   WBC 6.0 07/03/2023   RBC 3.73 (L) 07/03/2023   HGB 13.5 07/03/2023   HCT 39.5 07/03/2023   MCV 105.9 (H) 07/03/2023   MCH 36.2 (H) 07/03/2023    PLT 106 (L) 07/03/2023   MCHC 34.2 07/03/2023   RDW 13.6 07/03/2023   LYMPHSABS 0.4 (L) 07/01/2023   MONOABS 0.4 07/01/2023  EOSABS 0.0 07/01/2023   BASOSABS 0.0 07/01/2023     Last metabolic panel Lab Results  Component Value Date   NA 133 (L) 07/03/2023   K 3.0 (L) 07/03/2023   CL 96 (L) 07/03/2023   CO2 25 07/03/2023   BUN 9 07/03/2023   CREATININE 0.34 (L) 07/03/2023   GLUCOSE 101 (H) 07/03/2023   GFRNONAA >60 07/03/2023   GFRAA 101 02/11/2020   CALCIUM  9.3 07/03/2023   PHOS 3.1 07/02/2023   PROT 6.7 07/03/2023   ALBUMIN 3.4 (L) 07/03/2023   BILITOT 1.7 (H) 07/03/2023   ALKPHOS 106 07/03/2023   AST 43 (H) 07/03/2023   ALT 25 07/03/2023   ANIONGAP 12 07/03/2023    CBG (last 3)  Recent Labs    07/03/23 0728  GLUCAP 115*      Coagulation Profile: Recent Labs  Lab 07/03/23 0514  INR 1.3*     Radiology Studies: I have personally reviewed the imaging studies  CT HEAD WO CONTRAST ( ) Result Date: 07/02/2023 CLINICAL DATA:  Mental status change, unknown cause Sudden onset of shaking/tremulous EXAM: CT HEAD WITHOUT CONTRAST TECHNIQUE: Contiguous axial images were obtained from the base of the skull through the vertex without intravenous contrast. RADIATION DOSE REDUCTION: This exam was performed according to the departmental dose-optimization program which includes automated exposure control, adjustment of the mA and/or kV according to patient size and/or use of iterative reconstruction technique. COMPARISON:  CT head 10/02/2022. FINDINGS: Brain: No evidence of acute infarction, hemorrhage, hydrocephalus, extra-axial collection or mass lesion/mass effect. Vascular: No hyperdense vessel.  Calcific atherosclerosis. Skull: No acute fracture. Sinuses/Orbits: Clear sinuses.  No acute orbital findings. Other: No mastoid effusions. IMPRESSION: No evidence of acute intracranial abnormality. Electronically Signed   By: Stevenson Elbe M.D.   On: 07/02/2023 22:21   US   Abdomen Limited RUQ (LIVER/GB) Result Date: 07/02/2023 CLINICAL DATA:  Elevated LFTs EXAM: ULTRASOUND ABDOMEN LIMITED RIGHT UPPER QUADRANT COMPARISON:  CT abdomen and pelvis September 17, 2021 FINDINGS: Gallbladder: Multiple gallstones correlate with chronic cholelithiasis Common bile duct: Diameter: 7.3 mm, normal for patient's age Liver: Echogenic heterogeneous slightly nodular appearing liver could correlate with hepatocellular disease portal vein is patent on color Doppler imaging with normal direction of blood flow towards the liver. Other: None. IMPRESSION: *Chronic cholelithiasis. *Heterogeneous slightly nodular appearing liver could correlate with hepatocellular disease. Electronically Signed   By: Fredrich Jefferson M.D.   On: 07/02/2023 08:54       Gillian Kluever M.D. [facility] 07/03/2023, 12:29 PM  Available via Epic secure chat 7am-7pm After 7 pm, please refer to night coverage provider listed on amion.

## 2023-07-03 NOTE — TOC Transition Note (Signed)
 Transition of Care [facility]) - Discharge Note   Patient Details  Name: Linda Glass MRN: [id number] Date of Birth: 1933-12-24  [facility] of [facility] CM/SW Contact:  Ruben Corolla, RN Phone Number: 07/03/2023, 1:04 PM   Clinical Narrative: [facility] accepted for HHPT.       Final next level of care: Home w Home Health Services Barriers to Discharge: Continued Medical Work up   Patient Goals and CMS Choice Patient states their goals for this hospitalization and ongoing recovery are:: Home CMS Medicare.gov Compare Post Acute Care list provided to:: Patient Represenative (must comment) (Annette(dtr)) Choice offered to / list presented to : Adult Children [place] ownership interest in [facility].provided to:: Adult Children    Discharge Placement                       Discharge Plan and Services Additional resources added to the After Visit Summary for     Discharge Planning Services: CM Consult Post Acute Care Choice: Home Health                    HH Arranged: PT HH Agency: [facility]        Social Drivers of Health (SDOH) Interventions SDOH Screenings   Food Insecurity: No Food Insecurity (07/01/2023)  Housing: Low Risk  (07/01/2023)  Transportation Needs: No Transportation Needs (07/01/2023)  Utilities: Not At Risk (07/01/2023)  Alcohol Screen: Low Risk  (08/21/2022)  Depression (PHQ2-9): Low Risk  (10/22/2022)  Financial Resource Strain: Low Risk  (08/21/2022)  Physical Activity: Insufficiently Active (08/21/2022)  Social Connections: Moderately Isolated (07/01/2023)  Stress: No Stress Concern Present (08/21/2022)  Tobacco Use: Low Risk  (07/01/2023)     Readmission Risk Interventions     No data to display

## 2023-07-04 DIAGNOSIS — F411 Generalized anxiety disorder: Secondary | ICD-10-CM | POA: Diagnosis not present

## 2023-07-04 DIAGNOSIS — E872 Acidosis, unspecified: Secondary | ICD-10-CM | POA: Diagnosis not present

## 2023-07-04 DIAGNOSIS — R251 Tremor, unspecified: Secondary | ICD-10-CM | POA: Diagnosis not present

## 2023-07-04 LAB — COMPREHENSIVE METABOLIC PANEL WITH GFR
ALT: 21 U/L (ref 0–44)
AST: 33 U/L (ref 15–41)
Albumin: 3.1 g/dL — ABNORMAL LOW (ref 3.5–5.0)
Alkaline Phosphatase: 95 U/L (ref 38–126)
Anion gap: 10 (ref 5–15)
BUN: 9 mg/dL (ref 8–23)
CO2: 26 mmol/L (ref 22–32)
Calcium: 9.1 mg/dL (ref 8.9–10.3)
Chloride: 97 mmol/L — ABNORMAL LOW (ref 98–111)
Creatinine, Ser: 0.43 mg/dL — ABNORMAL LOW (ref 0.44–1.00)
GFR, Estimated: >60 mL/min (ref >60)
Glucose, Bld: 99 mg/dL (ref 70–99)
Potassium: 3.3 mmol/L — ABNORMAL LOW (ref 3.5–5.1)
Sodium: 133 mmol/L — ABNORMAL LOW (ref 135–145)
Total Bilirubin: 2.1 mg/dL — ABNORMAL HIGH (ref 0.0–1.2)
Total Protein: 6.5 g/dL (ref 6.5–8.1)

## 2023-07-04 LAB — CBC
HCT: 38.4 % (ref 36.0–46.0)
Hemoglobin: 13.3 g/dL (ref 12.0–15.0)
MCH: 36.6 pg — ABNORMAL HIGH (ref 26.0–34.0)
MCHC: 34.6 g/dL (ref 30.0–36.0)
MCV: 105.8 fL — ABNORMAL HIGH (ref 80.0–100.0)
Platelets: 108 10*3/uL — ABNORMAL LOW (ref 150–400)
RBC: 3.63 MIL/uL — ABNORMAL LOW (ref 3.87–5.11)
RDW: 13.5 % (ref 11.5–15.5)
WBC: 6.1 10*3/uL (ref 4.0–10.5)
nRBC: 0 % (ref 0.0–0.2)

## 2023-07-04 MED ORDER — ENSURE ENLIVE PO LIQD
237.0000 mL | Freq: Two times a day (BID) | ORAL | Status: DC
  Administered 2023-07-04 – 2023-07-07 (×5): 237 mL via ORAL

## 2023-07-04 MED ORDER — POTASSIUM CHLORIDE CRYS ER 20 MEQ PO TBCR
40.0000 meq | EXTENDED_RELEASE_TABLET | Freq: Once | ORAL | Status: AC
  Administered 2023-07-04: 40 meq via ORAL
  Filled 2023-07-04: qty 2

## 2023-07-04 MED ORDER — CITALOPRAM HYDROBROMIDE 20 MG PO TABS
20.0000 mg | ORAL_TABLET | Freq: Every day | ORAL | Status: DC
  Administered 2023-07-04 – 2023-07-07 (×4): 20 mg via ORAL
  Filled 2023-07-04 (×4): qty 1

## 2023-07-04 MED ORDER — CITALOPRAM HYDROBROMIDE 20 MG PO TABS: 20.0000 mg | ORAL_TABLET | Freq: Every day | ORAL | Status: DC

## 2023-07-04 MED ORDER — LORAZEPAM 0.5 MG PO TABS
0.5000 mg | ORAL_TABLET | Freq: Every day | ORAL | Status: DC
  Administered 2023-07-04 – 2023-07-06 (×3): 0.5 mg via ORAL
  Filled 2023-07-04 (×3): qty 1
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
## 2023-07-04 NOTE — [place] FL2 (Signed)
 [place]  MEDICAID FL2 LEVEL OF CARE FORM     IDENTIFICATION  Patient Name: Linda Glass Birthdate: October 24, 1933 Sex: female Admission Date (Current Location): 07/01/2023  [facility] and [place] Number:  Producer, television/film/video and Address:  [facility],  [address], [place] [address]      Provider Number: [id number]  Attending Physician Name and Address:  Loma Rising, MD  Relative Name and Phone Number:  Murlean Armour [phone]    Current Level of Care: Hospital Recommended Level of Care: Skilled Nursing Facility Prior Approval Number:    Date Approved/Denied:   PASRR Number: [id number]  Discharge Plan: SNF    Current Diagnoses: Patient Active Problem List   Diagnosis Date Noted   Hypomagnesemia 07/02/2023   Elevated LFTs 07/02/2023   High alkaline phosphatase 07/02/2023   Tremor 07/01/2023   Chronic diastolic heart failure (HCC) 10/22/2022   Thrombocytopenia (HCC) 10/22/2022   UTI (urinary tract infection) 10/03/2022   Fall 10/02/2022   Syncope 10/02/2022   Acute UTI (urinary tract infection) 10/02/2022   Hypercoagulable state (HCC) 07/27/2022   Cerumen impaction 10/11/2021   Hearing loss 10/11/2021   AKI (acute kidney injury) (HCC) 09/17/2021   Diarrhea 09/17/2021   Hyponatremia 09/17/2021   Metabolic acidosis 09/17/2021   Traumatic compression fracture of T8 vertebra (HCC) 09/17/2021   Breast mass, right 09/17/2021   Ovarian cyst 09/17/2021   Colonic mass 03/12/2021   Atherosclerosis of aorta (HCC) 01/05/2021   Multiple gastric ulcers    Cecum mass    2019 novel coronavirus disease (COVID-19) 05/02/2020   Persistent atrial fibrillation (HCC) 05/01/2020   Hypertension, essential, benign 07/05/2019   Generalized anxiety disorder 07/05/2019   Insomnia disorder 08/21/2016   Class 1 obesity 08/21/2016   Hyperlipidemia, mixed 08/21/2016    Orientation RESPIRATION BLADDER Height & Weight     Self, Time, Situation,  Place  Normal Continent Weight: 71.2 kg Height:  5' (152.4 cm)  BEHAVIORAL SYMPTOMS/MOOD NEUROLOGICAL BOWEL NUTRITION STATUS      Continent    AMBULATORY STATUS COMMUNICATION OF NEEDS Skin   Limited Assist Verbally Normal                       Personal Care Assistance Level of Assistance  Bathing, Feeding, Dressing Bathing Assistance: Limited assistance Feeding assistance: Limited assistance Dressing Assistance: Limited assistance     Functional Limitations Info  Sight, Hearing, Speech Sight Info: Impaired (eyeglasses) Hearing Info: Impaired (R hearing aid) Speech Info: Adequate    SPECIAL CARE FACTORS FREQUENCY  PT (By licensed PT), OT (By licensed OT)     PT Frequency: 5x week OT Frequency: 5x week            Contractures Contractures Info: Not present    Additional Factors Info  Code Status, Allergies Code Status Info: DNR Allergies Info: Losartan            Current Medications (07/04/2023):  This is the current hospital active medication list Current Facility-Administered Medications  Medication Dose Route Frequency Provider Last Rate Last Admin   acetaminophen  (TYLENOL ) tablet 650 mg  650 mg Oral Q6H PRN Amponsah, Prosper M, MD   650 mg at 07/03/23 2136   Or   acetaminophen  (TYLENOL ) suppository 650 mg  650 mg Rectal Q6H PRN Vita Grip, MD       amLODipine  (NORVASC ) tablet 10 mg  10 mg Oral Daily Amponsah, Prosper M, MD   10 mg at 07/04/23 [phone]  apixaban  (ELIQUIS ) tablet 5 mg  5 mg Oral BID Amponsah, Prosper M, MD   5 mg at 07/04/23 0919   atorvastatin  (LIPITOR) tablet 40 mg  40 mg Oral Daily Amponsah, Prosper M, MD   40 mg at 07/04/23 0920   citalopram  (CELEXA ) tablet 20 mg  20 mg Oral Daily Rai, Ripudeep K, MD   20 mg at 07/04/23 1428   cyanocobalamin  (VITAMIN B12) tablet 500 mcg  500 mcg Oral Daily Amponsah, Prosper M, MD   500 mcg at 07/04/23 0920   feeding supplement (ENSURE ENLIVE / ENSURE PLUS) liquid 237 mL  237 mL Oral BID BM Rai,  Ripudeep K, MD   237 mL at 07/04/23 1429   ferrous gluconate  (FERGON) tablet 324 mg  324 mg Oral QODAY Amponsah, Prosper M, MD   324 mg at 07/04/23 6045   folic acid  (FOLVITE ) tablet 1 mg  1 mg Oral Daily Rai, Ripudeep K, MD   1 mg at 07/04/23 0920   furosemide  (LASIX ) tablet 80 mg  80 mg Oral Daily Amponsah, Prosper M, MD   80 mg at 07/04/23 0920   hydrALAZINE  (APRESOLINE ) injection 10 mg  10 mg Intravenous Q6H PRN Rai, Ripudeep K, MD   10 mg at 07/02/23 2240   loperamide  (IMODIUM ) capsule 2 mg  2 mg Oral Q8H PRN Rai, Ripudeep K, MD       LORazepam  (ATIVAN ) tablet 0.5 mg  0.5 mg Oral QHS Lord, Jamison Y, NP       magnesium  oxide (MAG-OX) tablet 400 mg  400 mg Oral Daily Vita Grip, MD   400 mg at 07/04/23 4098   metoprolol  succinate (TOPROL -XL) 24 hr tablet 25 mg  25 mg Oral Daily Amponsah, Prosper M, MD   25 mg at 07/04/23 0919   multivitamin with minerals tablet 1 tablet  1 tablet Oral Daily Vita Grip, MD   1 tablet at 07/04/23 0920   ondansetron  (ZOFRAN ) tablet 4 mg  4 mg Oral Q6H PRN Amponsah, Prosper M, MD       Or   ondansetron  (ZOFRAN ) injection 4 mg  4 mg Intravenous Q6H PRN Amponsah, Prosper M, MD       pantoprazole  (PROTONIX ) EC tablet 40 mg  40 mg Oral Daily Amponsah, Prosper M, MD   40 mg at 07/04/23 0919   senna-docusate (Senokot-S) tablet 1 tablet  1 tablet Oral QHS PRN Vita Grip, MD       thiamine  (VITAMIN B1) tablet 100 mg  100 mg Oral Daily Rai, Ripudeep K, MD   100 mg at 07/04/23 0919   Or   thiamine  (VITAMIN B1) injection 100 mg  100 mg Intravenous Daily Rai, Ripudeep K, MD         Discharge Medications: Please see discharge summary for a list of discharge medications.  Relevant Imaging Results:  Relevant Lab Results:   Additional Information ss[address][id number] [place], [place]

## 2023-07-04 NOTE — Plan of Care (Signed)

## 2023-07-04 NOTE — Discharge Instructions (Signed)
 [facility] Syed T. Arfeen, MD--psychiatrist  [address]  [phone]  [facility] [place]--therapist [address]  [phone]

## 2023-07-04 NOTE — Progress Notes (Signed)
 Physical Therapy Treatment Patient Details Name: Linda Glass MRN: [id number] DOB: Dec 29, 1933 Today's Date: 07/04/2023   History of Present Illness 89 year old female presenting to [facility] ED on 5/6 with tremors.  PMH: T8 and T12 fx,AKI, HLD, HTN, PAF, anemia,persistent A fib, anxiety.    PT Comments  PT - Cognition Comments: AxO x 3 very sharpe lady who lives home alone and was driving.  VERY HOH.  Impulsive.  HIGH anxiety/fear of falling Pt was OOB in recliner.  Son in [place] and [place] visiting.  Assisted with amb to bathroom was difficult.  General transfer comment: Impulsive. Cues for safety, hand placement and safety with turn completion.  Assisted to bathroom Pt required increased asisst from lower toilet height.  HIGH FALL RISK General Gait Details: VERY unsteady gait requiring VC's on proper walker to self distance and safety with turn completion/targeting as Pt tends to sit to quickly due to weakness/fatigue.   Trial attempted to amb without walker resulted in a near fall.  Present with increased gait instability, tremors and ataxia.  HIGH FALL RISK.  Pt lives home alone.  Consulted LPT Pt agreeing to go to SNF for severe gait instability and weakness.    If plan is discharge home, recommend the following: A little help with walking and/or transfers;A little help with bathing/dressing/bathroom;Assistance with cooking/housework;Assist for transportation;Help with stairs or ramp for entrance   Can travel by private vehicle     Yes  Equipment Recommendations  None recommended by PT    Recommendations for Other Services       Precautions / Restrictions Precautions Precautions: Fall Precaution/Restrictions Comments: high anxiety Restrictions Weight Bearing Restrictions Per Provider Order: No     Mobility  Bed Mobility               General bed mobility comments: OOB in recliner    Transfers Overall transfer level: Needs assistance Equipment used: Rolling  walker (2 wheels), None Transfers: Sit to/from Stand Sit to Stand: Contact guard assist   Step pivot transfers: Contact guard assist, Min assist       General transfer comment: Impulsive. Cues for safety, hand placement and safety with turn completion.  Assisted to bathroom Pt required increased asisst from lower toilet height.  HIGH FALL RISK    Ambulation/Gait Ambulation/Gait assistance: Contact guard assist, Min assist, Mod assist Gait Distance (Feet): 22 Feet Assistive device: Rolling walker (2 wheels) Gait Pattern/deviations: Step-through pattern, Decreased stride length, Wide base of support Gait velocity: decreased     General Gait Details: VERY unsteady gait requiring VC's on proper walker to self distance and safety with turn completion/targeting as Pt tends to sit to quickly due to weakness/fatigue.   Trial attempted to amb without walker resulted in a near fall.  Present with increased gait instability, tremors and ataxia.  HIGH FALL RISK.   Stairs             Wheelchair Mobility     Tilt Bed    Modified Rankin (Stroke Patients Only)       Balance   Sitting-balance support: No upper extremity supported, Feet supported Sitting balance-Leahy Scale: Fair     Standing balance support: During functional activity, Reliant on assistive device for balance Standing balance-Leahy Scale: Poor Standing balance comment: increased ataxia/tremors                 Standardized Balance Assessment Standardized Balance Assessment : Berg Balance Test Berg Balance Test Sit to Stand: Needs moderate or  maximal assist to stand Standing Unsupported: Needs several tries to stand 30 seconds unsupported Sitting with Back Unsupported but Feet Supported on Floor or Stool: Able to sit safely and securely 2 minutes Stand to Sit: Controls descent by using hands Transfers: Needs one person to assist Standing Unsupported with Eyes Closed: Unable to keep eyes closed 3 seconds  but stays steady Standing Ubsupported with Feet Together: Needs help to attain position and unable to hold for 15 seconds From Standing, Reach Forward with Outstretched Arm: Reaches forward but needs supervision From Standing Position, Pick up Object from Floor: Unable to pick up shoe, but reaches 2-5 cm (1-2") from shoe and balances independently From Standing Position, Turn to Look Behind Over each Shoulder: Needs supervision when turning Turn 360 Degrees: Needs close supervision or verbal cueing Standing Unsupported, Alternately Place Feet on Step/Stool: Needs assistance to keep from falling or unable to try Standing Unsupported, One Foot in Front: Loses balance while stepping or standing Standing on One Leg: Unable to try or needs assist to prevent fall Total Score: 15        Communication Communication Communication: Impaired Factors Affecting Communication: Hearing impaired  Cognition Arousal: Alert Behavior During Therapy: WFL for tasks assessed/performed   PT - Cognitive impairments: No apparent impairments                       PT - Cognition Comments: AxO x 3 very sharpe lady who lives home alone and was driving.  VERY HOH.  Impulsive.  HIGH anxiety/fear of falling Following commands: Intact      Cueing Cueing Techniques: Verbal cues  Exercises      General Comments        Pertinent Vitals/Pain Pain Assessment Pain Assessment: No/denies pain    Home Living                          Prior Function            PT Goals (current goals can now be found in the care plan section) Progress towards PT goals: Progressing toward goals    Frequency    Min 3X/week      PT Plan      Co-evaluation              AM-PAC PT "6 Clicks" Mobility   Outcome Measure  Help needed turning from your back to your side while in a flat bed without using bedrails?: A Little Help needed moving from lying on your back to sitting on the side of a flat  bed without using bedrails?: A Little Help needed moving to and from a bed to a chair (including a wheelchair)?: A Little Help needed standing up from a chair using your arms (e.g., wheelchair or bedside chair)?: A Lot Help needed to walk in hospital room?: A Lot Help needed climbing 3-5 steps with a railing? : Total 6 Click Score: 14    End of Session Equipment Utilized During Treatment: Gait belt Activity Tolerance: Patient limited by fatigue Patient left: in chair;with call bell/phone within reach;with chair alarm set;with family/visitor present Nurse Communication: Mobility status PT Visit Diagnosis: Unsteadiness on feet (R26.81);Muscle weakness (generalized) (M62.81);Difficulty in walking, not elsewhere classified (R26.2)     Time: 1610-9604 PT Time Calculation (min) (ACUTE ONLY): 25 min  Charges:    $Gait Training: 8-22 mins $Therapeutic Activity: 8-22 mins PT General Charges $$ ACUTE PT VISIT: 1 Visit  Bess Broody  PTA Acute  Rehabilitation Services Office M-F          [phone]

## 2023-07-04 NOTE — Progress Notes (Signed)
 [facility]                                                                              Linda Glass, is a 89 y.o. female, DOB - 1933/09/21, ZOX:[id number] Admit date - 07/01/2023    Outpatient Primary MD for the patient is [name], [name], MD  LOS - 0  days  Chief Complaint  Patient presents with   Tremors       Brief summary   Patient is a 89 year old female with anxiety, A-fib on Eliquis , chronic diastolic CHF, hyperlipidemia, hypertension, arthritis, who initially presented to [facility] ED on 5/6 for sudden shaking all over/tremors.  Patient reported that since a day before the admission, she had felt anxious and tremulous in upper and lower extremities.  Her tremors worsened on the morning of admission and she had trouble walking and her neighbor brought her to the ED.  Patient reported that her outpatient PCP was currently weaning her Celexa , she recently started alternating 20 mg and 10 mg of Celexa  daily for 2 weeks with plan to remain on 10 mg dose after 2 weeks.  She is also on Ativan  at night to help her sleep and reported taking Ativan  at night before prior to arrival to ED. Patient was unable to ambulate in the ED hence was admitted for further workup.  Assessment & Plan     Tremors/sudden onset of shaking - Elderly patient on Celexa  and Ativan  presenting with acute onset of bilateral hand and leg tremors, unclear cause of her tremors could be secondary to withdrawals versus worsening anxiety.  Per patient, she only drinks 3 to 4 glasses of wine in a week, not every day - Placed on CIWA protocol with Ativan , improving - Continue thiamine , folate, MVI - B12, folate, TSH within normal limits, B1 in process - Counseled strongly on alcohol cessation - It appears that patient had done well with Celexa  20 mg daily am and Ativan  0.5 mg at bedtime however was recently tapered from Celexa  with 20 mg every other day for 2 weeks then 10 mg daily.   - Given she  has significant anxiety issues, psych was consulted for recommendations with her medication regimen.  - Discussed with psychiatry in detail, apparently patient had increased Ativan  to 1 mg at bedtime and was drinking alcohol at the same time while decreasing Celexa , which likely caused the tremors, shaking and speech difficulties. - Per psychiatry, continue the same regimen that patient had been on for years which is Celexa  20 mg daily and Ativan  0.5 mg at bedtime.  Patient was counseled strongly to quit drinking alcohol     Persistent A-fib - Continue Toprol -XL, HR fairly controlled, overnight had some bradycardia -Continue Eliquis     Chronic diastolic HF - Euvolemic, continue home dose of Lasix , 80 mg daily - Continue Toprol -XL, amlodipine      Hypertension - Continue Toprol -XL, amlodipine , furosemide       Elevated alk phos,  Transaminitis, thrombocytopenia - Initial CMP showed AST/ALT 63/34 and alk phos 159, thrombocytopenia, suggesting alcoholic liver disease - LFTs improving, AST> ALT.  Patient reports drinking 3-4 glasses of wine weekly. - The  2:1 ratio of AST/ALT likely indicate EtOH use as the cause of her transaminitis - Right upper quadrant ultrasound shows a chronic cholelithiasis, heterogenous slightly nodular appearing liver could correlate with hepatocellular disease - INR 1.3.  Counseled again on alcohol cessation.    Anxiety - Continue Celexa  - Prior to admission, takes Ativan  0.5 mg daily at night for sleep -Appreciate psychiatry recommendations   HLD - Continue atorvastatin     GERD - Continue Protonix    Diarrhea -C. difficile panel negative, GI pathogen panel negative - Placed on Imodium  as needed -Improved  Obesity class I Estimated body mass index is 30.66 kg/m as calculated from the following:   Height as of this encounter: 5' (1.524 m).   Weight as of this encounter: 71.2 kg.  Code Status: DNR DVT Prophylaxis:   apixaban  (ELIQUIS ) tablet 5 mg    Level of Care: Level of care: Telemetry Family Communication: Updated patient Disposition Plan:      Remains inpatient appropriate: Will monitor today, likely DC home over the weekend.  Apparently patient lives alone and daughter is moving here to provide more support to her.   Procedures:    Consultants:   Psychiatry  Antimicrobials:   Anti-infectives (From admission, onward)    None          Medications  amLODipine   10 mg Oral Daily   apixaban   5 mg Oral BID   atorvastatin   40 mg Oral Daily   [START ON 07/05/2023] citalopram   20 mg Oral Daily   cyanocobalamin   500 mcg Oral Daily   feeding supplement  237 mL Oral BID BM   ferrous gluconate   324 mg Oral QODAY   folic acid   1 mg Oral Daily   furosemide   80 mg Oral Daily   LORazepam   0.5 mg Oral QHS   magnesium  oxide  400 mg Oral Daily   metoprolol  succinate  25 mg Oral Daily   multivitamin with minerals  1 tablet Oral Daily   pantoprazole   40 mg Oral Daily   thiamine   100 mg Oral Daily   Or   thiamine   100 mg Intravenous Daily      Subjective:   Linda Glass was seen and examined today.  Sitting in the chair, states hearing aid is not working.  Wants to go home.  States she feels a lot better now.  Alert and oriented, tremulousness has significantly improved  Objective:   Vitals:   07/03/23 0520 07/03/23 1455 07/03/23 2009 07/04/23 0525  BP: (!) 159/71 (!) 154/80 (!) 157/91 (!) 156/75  Pulse: 75 70 (!) 59 (!) 52  Resp: 16 18 16 16   Temp: 97.9 F (36.6 C) (!) 97.5 F (36.4 C) 98.2 F (36.8 C) 98 F (36.7 C)  TempSrc:  Oral Oral   SpO2: 97% 93% 94% 97%  Weight:      Height:        Intake/Output Summary (Last 24 hours) at 07/04/2023 1313 Last data filed at 07/03/2023 2136 Gross per 24 hour  Intake 240 ml  Output --  Net 240 ml     Wt Readings from Last 3 Encounters:  07/01/23 71.2 kg  10/22/22 72.6 kg  10/11/22 70.8 kg    Physical Exam General: Alert and oriented x 3,  NAD Cardiovascular: S1 S2 clear, RRR.  Respiratory: CTAB, no wheezing Gastrointestinal: Soft, nontender, nondistended, NBS Ext: no pedal edema bilaterally, tremulousness has been improving Neuro: no new deficits Psych: Normal affect     Data Reviewed:  I have personally reviewed following labs    CBC Lab Results  Component Value Date   WBC 6.1 07/04/2023   RBC 3.63 (L) 07/04/2023   HGB 13.3 07/04/2023   HCT 38.4 07/04/2023   MCV 105.8 (H) 07/04/2023   MCH 36.6 (H) 07/04/2023   PLT 108 (L) 07/04/2023   MCHC 34.6 07/04/2023   RDW 13.5 07/04/2023   LYMPHSABS 0.4 (L) 07/01/2023   MONOABS 0.4 07/01/2023   EOSABS 0.0 07/01/2023   BASOSABS 0.0 07/01/2023     Last metabolic panel Lab Results  Component Value Date   NA 133 (L) 07/04/2023   K 3.3 (L) 07/04/2023   CL 97 (L) 07/04/2023   CO2 26 07/04/2023   BUN 9 07/04/2023   CREATININE 0.43 (L) 07/04/2023   GLUCOSE 99 07/04/2023   GFRNONAA >60 07/04/2023   GFRAA 101 02/11/2020   CALCIUM  9.1 07/04/2023   PHOS 3.1 07/02/2023   PROT 6.5 07/04/2023   ALBUMIN 3.1 (L) 07/04/2023   BILITOT 2.1 (H) 07/04/2023   ALKPHOS 95 07/04/2023   AST 33 07/04/2023   ALT 21 07/04/2023   ANIONGAP 10 07/04/2023    CBG (last 3)  Recent Labs    07/03/23 0728  GLUCAP 115*      Coagulation Profile: Recent Labs  Lab 07/03/23 0514  INR 1.3*     Radiology Studies: I have personally reviewed the imaging studies  CT HEAD WO CONTRAST ( ) Result Date: 07/02/2023 CLINICAL DATA:  Mental status change, unknown cause Sudden onset of shaking/tremulous EXAM: CT HEAD WITHOUT CONTRAST TECHNIQUE: Contiguous axial images were obtained from the base of the skull through the vertex without intravenous contrast. RADIATION DOSE REDUCTION: This exam was performed according to the departmental dose-optimization program which includes automated exposure control, adjustment of the mA and/or kV according to patient size and/or use of iterative  reconstruction technique. COMPARISON:  CT head 10/02/2022. FINDINGS: Brain: No evidence of acute infarction, hemorrhage, hydrocephalus, extra-axial collection or mass lesion/mass effect. Vascular: No hyperdense vessel.  Calcific atherosclerosis. Skull: No acute fracture. Sinuses/Orbits: Clear sinuses.  No acute orbital findings. Other: No mastoid effusions. IMPRESSION: No evidence of acute intracranial abnormality. Electronically Signed   By: Stevenson Elbe M.D.   On: 07/02/2023 22:21       Caris Cerveny M.D. [facility] 07/04/2023, 1:13 PM  Available via Epic secure chat 7am-7pm After 7 pm, please refer to night coverage provider listed on amion.

## 2023-07-04 NOTE — TOC Progression Note (Addendum)
 Transition of Care [facility]) - Progression Note    Patient Details  Name: Linda Glass MRN: [id number] Date of Birth: 22-Nov-1933  [facility]) CM/SW Contact  Mahayla Haddaway, Thersia Flax, RN Phone Number: 07/04/2023, 3:09 PM  Clinical Narrative: Patient now agrees to ST SNF-faxed out prefers [facility] [address] has gone there in past. Await bed offers, prior auth.   -420p-pending [id number] await auth for [facility].To pull name up in [facility] 1st name is [name].    Expected Discharge Plan: Skilled Nursing Facility Barriers to Discharge: Continued Medical Work up  Expected Discharge Plan and Services   Discharge Planning Services: CM Consult Post Acute Care Choice: Home Health Living arrangements for the past 2 months: Single Family Home                           HH Arranged: PT HH Agency: [facility]         Social Determinants of Health (SDOH) Interventions SDOH Screenings   Food Insecurity: No Food Insecurity (07/01/2023)  Housing: Low Risk  (07/01/2023)  Transportation Needs: No Transportation Needs (07/01/2023)  Utilities: Not At Risk (07/01/2023)  Alcohol Screen: Low Risk  (08/21/2022)  Depression (PHQ2-9): Low Risk  (10/22/2022)  Financial Resource Strain: Low Risk  (08/21/2022)  Physical Activity: Insufficiently Active (08/21/2022)  Social Connections: Moderately Isolated (07/01/2023)  Stress: No Stress Concern Present (08/21/2022)  Tobacco Use: Low Risk  (07/01/2023)    Readmission Risk Interventions     No data to display

## 2023-07-05 DIAGNOSIS — E872 Acidosis, unspecified: Secondary | ICD-10-CM | POA: Diagnosis not present

## 2023-07-05 DIAGNOSIS — R251 Tremor, unspecified: Secondary | ICD-10-CM | POA: Diagnosis not present

## 2023-07-05 DIAGNOSIS — F419 Anxiety disorder, unspecified: Secondary | ICD-10-CM

## 2023-07-05 DIAGNOSIS — F411 Generalized anxiety disorder: Secondary | ICD-10-CM | POA: Diagnosis not present

## 2023-07-05 LAB — CBC
HCT: 41.7 % (ref 36.0–46.0)
Hemoglobin: 13.8 g/dL (ref 12.0–15.0)
MCH: 36.4 pg — ABNORMAL HIGH (ref 26.0–34.0)
MCHC: 33.1 g/dL (ref 30.0–36.0)
MCV: 110 fL — ABNORMAL HIGH (ref 80.0–100.0)
Platelets: 110 10*3/uL — ABNORMAL LOW (ref 150–400)
RBC: 3.79 MIL/uL — ABNORMAL LOW (ref 3.87–5.11)
RDW: 13.5 % (ref 11.5–15.5)
WBC: 6.9 10*3/uL (ref 4.0–10.5)
nRBC: 0 % (ref 0.0–0.2)

## 2023-07-05 LAB — COMPREHENSIVE METABOLIC PANEL WITH GFR
ALT: 20 U/L (ref 0–44)
AST: 29 U/L (ref 15–41)
Albumin: 3.4 g/dL — ABNORMAL LOW (ref 3.5–5.0)
Alkaline Phosphatase: 98 U/L (ref 38–126)
Anion gap: 13 (ref 5–15)
BUN: 11 mg/dL (ref 8–23)
CO2: 25 mmol/L (ref 22–32)
Calcium: 9.5 mg/dL (ref 8.9–10.3)
Chloride: 95 mmol/L — ABNORMAL LOW (ref 98–111)
Creatinine, Ser: 0.64 mg/dL (ref 0.44–1.00)
GFR, Estimated: >60 mL/min (ref >60)
Glucose, Bld: 115 mg/dL — ABNORMAL HIGH (ref 70–99)
Potassium: 3.6 mmol/L (ref 3.5–5.1)
Sodium: 133 mmol/L — ABNORMAL LOW (ref 135–145)
Total Bilirubin: 1.6 mg/dL — ABNORMAL HIGH (ref 0.0–1.2)
Total Protein: 7.1 g/dL (ref 6.5–8.1)

## 2023-07-05 LAB — VITAMIN B1: Vitamin B1 (Thiamine): 176.6 nmol/L (ref 66.5–200.0)

## 2023-07-05 MED ORDER — FOLIC ACID 1 MG PO TABS: 1.0000 mg | ORAL_TABLET | Freq: Every day | ORAL | 3 refills | Status: AC

## 2023-07-05 MED ORDER — VITAMIN B-1 100 MG PO TABS: 100.0000 mg | ORAL_TABLET | Freq: Every day | ORAL | 1 refills | Status: AC

## 2023-07-05 MED ORDER — CITALOPRAM HYDROBROMIDE 20 MG PO TABS: 20.0000 mg | ORAL_TABLET | Freq: Every day | ORAL | 0 refills | Status: DC

## 2023-07-05 MED ORDER — ONDANSETRON HCL 4 MG PO TABS: 4.0000 mg | ORAL_TABLET | Freq: Three times a day (TID) | ORAL | 0 refills | Status: DC | PRN

## 2023-07-05 MED ORDER — LORAZEPAM 0.5 MG PO TABS: 0.5000 mg | ORAL_TABLET | Freq: Every day | ORAL | 0 refills | Status: DC

## 2023-07-05 NOTE — Plan of Care (Signed)

## 2023-07-05 NOTE — TOC Progression Note (Addendum)
 Transition of Care [facility]) - Progression Note    Patient Details  Name: Linda Glass MRN: [id number] Date of Birth: 01-25-1934  [facility]) CM/SW Contact  Levie Ream, RN Phone Number: 07/05/2023, 12:15 PM  Clinical Narrative:    LVM for Daril Edge at [facility] to see if ins [name] has been received; awaiting return call.  -[id number]- ins auth shows pending-hold in [place]; spoke w/ Camilo Cella at [facility]; she says ins [name] has been approved but plan auth ID not yet generated; start date 07/04/23, end date 07/08/23, auth ID # [id number]; notified by Daril Edge at [facility] bed will be available for pt on Monday, but as soon as Sunday; she will notify TOC when bed available; Dr Thelma Fire notified via secure chat. Expected Discharge Plan: Skilled Nursing Facility Barriers to Discharge: Continued Medical Work up  Expected Discharge Plan and Services   Discharge Planning Services: CM Consult Post Acute Care Choice: Home Health Living arrangements for the past 2 months: Single Family Home                           HH Arranged: PT HH Agency: [facility]         Social Determinants of Health (SDOH) Interventions SDOH Screenings   Food Insecurity: No Food Insecurity (07/01/2023)  Housing: Low Risk  (07/01/2023)  Transportation Needs: No Transportation Needs (07/01/2023)  Utilities: Not At Risk (07/01/2023)  Alcohol Screen: Low Risk  (08/21/2022)  Depression (PHQ2-9): Low Risk  (10/22/2022)  Financial Resource Strain: Low Risk  (08/21/2022)  Physical Activity: Insufficiently Active (08/21/2022)  Social Connections: Moderately Isolated (07/01/2023)  Stress: No Stress Concern Present (08/21/2022)  Tobacco Use: Low Risk  (07/01/2023)    Readmission Risk Interventions     No data to display

## 2023-07-05 NOTE — Progress Notes (Signed)
 [facility]                                                                              Linda Glass, is a 89 y.o. female, DOB - August 17, 1933, HYQ:[id number] Admit date - 07/01/2023    Outpatient Primary MD for the patient is [name], [name], MD  LOS - 0  days  Chief Complaint  Patient presents with   Tremors       Brief summary   Patient is a 89 year old female with anxiety, A-fib on Eliquis , chronic diastolic CHF, hyperlipidemia, hypertension, arthritis, who initially presented to [facility] on 5/6 for sudden shaking all over/tremors.  Patient reported that since a day before the admission, she had felt anxious and tremulous in upper and lower extremities.  Her tremors worsened on the morning of admission and she had trouble walking and her neighbor brought her to the ED.  Patient reported that her outpatient PCP was currently weaning her Celexa , she recently started alternating 20 mg and 10 mg of Celexa  daily for 2 weeks with plan to remain on 10 mg dose after 2 weeks.  She is also on Ativan  at night to help her sleep and reported taking Ativan  at night before prior to arrival to ED. Patient was unable to ambulate in the ED hence was admitted for further workup.  Assessment & Plan     Tremors/sudden onset of shaking - Elderly patient on Celexa  and Ativan  presenting with acute onset of bilateral hand and leg tremors.  Reported that she drinks a glass of wine at bedtime. - Patient was placed on CIWA protocol with Ativan  - Continue thiamine , folate, MVI - B12, folate, TSH within normal limits, B1 in process - Counseled strongly on alcohol cessation - It appears that patient had done well with Celexa  20 mg daily am and Ativan  0.5 mg at bedtime however was recently tapered from Celexa  with 20 mg every other day for 2 weeks then 10 mg daily.   - Given she has significant anxiety issues, psych was consulted for recommendations with her medication regimen.  -  Discussed with psychiatry in detail, apparently patient had increased Ativan  to 1 mg at bedtime and was drinking alcohol at the same time while decreasing Celexa , which likely caused the tremors, shaking and speech difficulties. - Per psychiatry, continue the same regimen that patient had been on for years which is Celexa  20 mg daily and Ativan  0.5 mg at bedtime.  Patient was counseled strongly to quit drinking alcohol     Persistent A-fib - Continue Toprol -XL, HR fairly controlled, overnight had some bradycardia -Continue Eliquis     Chronic diastolic HF - Euvolemic, continue home dose of Lasix , 80 mg daily - Continue Toprol -XL, amlodipine      Hypertension - Continue Toprol -XL, amlodipine , furosemide       Elevated alk phos,  Transaminitis, thrombocytopenia - Initial CMP showed AST/ALT 63/34 and alk phos 159, thrombocytopenia, suggesting alcoholic liver disease - LFTs improving, AST> ALT.  Patient reports drinking 3-4 glasses of wine weekly. - The 2:1 ratio of AST/ALT likely indicate EtOH use as the cause of her transaminitis - Right upper quadrant ultrasound  shows a chronic cholelithiasis, heterogenous slightly nodular appearing liver could correlate with hepatocellular disease - INR 1.3.  Counseled again on alcohol cessation.    Anxiety - Continue Celexa  - Prior to admission, takes Ativan  0.5 mg daily at night for sleep -Appreciate psychiatry recommendations   HLD - Continue atorvastatin     GERD - Continue Protonix    Diarrhea -C. difficile panel negative, GI pathogen panel negative - Placed on Imodium  as needed -Improved  Obesity class I Estimated body mass index is 30.66 kg/m as calculated from the following:   Height as of this encounter: 5' (1.524 m).   Weight as of this encounter: 71.2 kg.  Code Status: DNR DVT Prophylaxis:   apixaban  (ELIQUIS ) tablet 5 mg   Level of Care: Level of care: Telemetry Family Communication: Updated patient Disposition Plan:       Remains inpatient appropriate: Awaiting SNF  Procedures:    Consultants:   Psychiatry  Antimicrobials:   Anti-infectives (From admission, onward)    None          Medications  amLODipine   10 mg Oral Daily   apixaban   5 mg Oral BID   atorvastatin   40 mg Oral Daily   citalopram   20 mg Oral Daily   cyanocobalamin   500 mcg Oral Daily   feeding supplement  237 mL Oral BID BM   ferrous gluconate   324 mg Oral QODAY   folic acid   1 mg Oral Daily   furosemide   80 mg Oral Daily   LORazepam   0.5 mg Oral QHS   magnesium  oxide  400 mg Oral Daily   metoprolol  succinate  25 mg Oral Daily   multivitamin with minerals  1 tablet Oral Daily   pantoprazole   40 mg Oral Daily   thiamine   100 mg Oral Daily   Or   thiamine   100 mg Intravenous Daily      Subjective:   Davis Rosebush was seen and examined today.  Sitting in the chair, feels better, states she would like to go to the rehab to get stronger.  Alert and oriented, tremulousness has improved.  Objective:   Vitals:   07/04/23 1321 07/04/23 2045 07/05/23 0442 07/05/23 1222  BP: (!) 151/68 (!) 156/85 (!) 154/75 (!) 146/81  Pulse: 80 63 (!) 57 70  Resp: 20 20 20 20   Temp: (!) 97.5 F (36.4 C) 98.5 F (36.9 C) 97.8 F (36.6 C) 97.8 F (36.6 C)  TempSrc: Oral Oral Oral Oral  SpO2: 90% 95% 97% 94%  Weight:      Height:        Intake/Output Summary (Last 24 hours) at 07/05/2023 1234 Last data filed at 07/04/2023 2131 Gross per 24 hour  Intake 240 ml  Output --  Net 240 ml     Wt Readings from Last 3 Encounters:  07/01/23 71.2 kg  10/22/22 72.6 kg  10/11/22 70.8 kg   Physical Exam General: Alert and oriented x 3, NAD, hearing deficit Cardiovascular: S1 S2 clear, RRR.  Respiratory: CTAB, no wheezing, rales or rhonchi Gastrointestinal: Soft, nontender, nondistended, NBS Ext: no pedal edema bilaterally Neuro: no new deficits, tremulousness has improved Psych: Normal affect       Data Reviewed:  I have  personally reviewed following labs    CBC Lab Results  Component Value Date   WBC 6.9 07/05/2023   RBC 3.79 (L) 07/05/2023   HGB 13.8 07/05/2023   HCT 41.7 07/05/2023   MCV 110.0 (H) 07/05/2023   MCH 36.4 (H)  07/05/2023   PLT 110 (L) 07/05/2023   MCHC 33.1 07/05/2023   RDW 13.5 07/05/2023   LYMPHSABS 0.4 (L) 07/01/2023   MONOABS 0.4 07/01/2023   EOSABS 0.0 07/01/2023   BASOSABS 0.0 07/01/2023     Last metabolic panel Lab Results  Component Value Date   NA 133 (L) 07/05/2023   K 3.6 07/05/2023   CL 95 (L) 07/05/2023   CO2 25 07/05/2023   BUN 11 07/05/2023   CREATININE 0.64 07/05/2023   GLUCOSE 115 (H) 07/05/2023   GFRNONAA >60 07/05/2023   GFRAA 101 02/11/2020   CALCIUM  9.5 07/05/2023   PHOS 3.1 07/02/2023   PROT 7.1 07/05/2023   ALBUMIN 3.4 (L) 07/05/2023   BILITOT 1.6 (H) 07/05/2023   ALKPHOS 98 07/05/2023   AST 29 07/05/2023   ALT 20 07/05/2023   ANIONGAP 13 07/05/2023    CBG (last 3)  Recent Labs    07/03/23 0728  GLUCAP 115*      Coagulation Profile: Recent Labs  Lab 07/03/23 0514  INR 1.3*     Radiology Studies: I have personally reviewed the imaging studies  No results found.      Bertram Brocks M.D. [facility] 07/05/2023, 12:34 PM  Available via Epic secure chat 7am-7pm After 7 pm, please refer to night coverage provider listed on amion.
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
## 2023-07-06 DIAGNOSIS — R251 Tremor, unspecified: Secondary | ICD-10-CM | POA: Diagnosis not present

## 2023-07-06 DIAGNOSIS — F419 Anxiety disorder, unspecified: Secondary | ICD-10-CM | POA: Diagnosis not present

## 2023-07-06 DIAGNOSIS — E872 Acidosis, unspecified: Secondary | ICD-10-CM | POA: Diagnosis not present

## 2023-07-06 MED ORDER — METOPROLOL SUCCINATE ER 25 MG PO TB24
12.5000 mg | ORAL_TABLET | Freq: Every day | ORAL | Status: DC
  Administered 2023-07-07: 12.5 mg via ORAL
  Filled 2023-07-06: qty 1

## 2023-07-06 NOTE — Progress Notes (Signed)
 [facility]                                                                              Linda Glass, is a 89 y.o. female, DOB - 1933/09/16, ZOX:[id number] Admit date - 07/01/2023    Outpatient Primary MD for the patient is [name], [name], MD  LOS - 0  days  Chief Complaint  Patient presents with   Tremors       Brief summary   Patient is a 89 year old female with anxiety, A-fib on Eliquis , chronic diastolic CHF, hyperlipidemia, hypertension, arthritis, who initially presented to [facility] on 5/6 for sudden shaking all over/tremors.  Patient reported that since a day before the admission, she had felt anxious and tremulous in upper and lower extremities.  Her tremors worsened on the morning of admission and she had trouble walking and her neighbor brought her to the ED.  Patient reported that her outpatient PCP was currently weaning her Celexa , she recently started alternating 20 mg and 10 mg of Celexa  daily for 2 weeks with plan to remain on 10 mg dose after 2 weeks.  She is also on Ativan  at night to help her sleep and reported taking Ativan  at night before prior to arrival to ED. Patient was unable to ambulate in the ED hence was admitted for further workup.  Assessment & Plan     Tremors/sudden onset of shaking - Elderly patient on Celexa  and Ativan  presenting with acute onset of bilateral hand and leg tremors.  Reported that she drinks a glass of wine at bedtime. - Patient was placed on CIWA protocol with Ativan  - Continue thiamine , folate, MVI - B12, folate, TSH within normal limits, B1 in process - Counseled strongly on alcohol cessation - It appears that patient had done well with Celexa  20 mg daily am and Ativan  0.5 mg at bedtime however was recently tapered from Celexa  with 20 mg every other day for 2 weeks then 10 mg daily.  Psychiatry was consulted - Discussed with psychiatry in detail, apparently patient had increased Ativan  to 1 mg at bedtime  and was drinking alcohol at the same time while decreasing Celexa , which likely caused the tremors, shaking and speech difficulties. - Per psychiatry, continue the same regimen that patient had been on for years which is Celexa  20 mg daily and Ativan  0.5 mg at bedtime.  Patient was counseled strongly to quit drinking alcohol - Sitting up in the chair, doing well, tremulousness has significantly improved     Persistent A-fib - Over night has bradycardia, decreased Toprol -XL to 12.5 mg daily -Continue Eliquis     Chronic diastolic HF - Euvolemic, continue home dose of Lasix , 80 mg daily - Continue Toprol -XL, amlodipine      Hypertension - Continue Toprol -XL, amlodipine , furosemide       Elevated alk phos,  Transaminitis, thrombocytopenia - Initial CMP showed AST/ALT 63/34 and alk phos 159, thrombocytopenia, suggesting alcoholic liver disease - LFTs improving, AST> ALT.  Patient reports drinking 3-4 glasses of wine weekly. - The 2:1 ratio of AST/ALT likely indicate EtOH use as the cause of her transaminitis - Right upper quadrant ultrasound shows a  chronic cholelithiasis, heterogenous slightly nodular appearing liver could correlate with hepatocellular disease - INR 1.3.  Counseled again on alcohol cessation.    Anxiety - Continue Celexa  - Prior to admission, takes Ativan  0.5 mg daily at night for sleep -Appreciate psychiatry recommendations   HLD - Continue atorvastatin     GERD - Continue Protonix    Diarrhea -C. difficile panel negative, GI pathogen panel negative - Placed on Imodium  as needed -Improved  Obesity class I Estimated body mass index is 30.66 kg/m as calculated from the following:   Height as of this encounter: 5' (1.524 m).   Weight as of this encounter: 71.2 kg.  Code Status: DNR DVT Prophylaxis:   apixaban  (ELIQUIS ) tablet 5 mg   Level of Care: Level of care: Telemetry Family Communication: Updated patient Disposition Plan:      Remains inpatient  appropriate: Awaiting SNF, possible DC to SNF in a.m., awaiting bed  Procedures:    Consultants:   Psychiatry  Antimicrobials:   Anti-infectives (From admission, onward)    None          Medications  amLODipine   10 mg Oral Daily   apixaban   5 mg Oral BID   atorvastatin   40 mg Oral Daily   citalopram   20 mg Oral Daily   cyanocobalamin   500 mcg Oral Daily   feeding supplement  237 mL Oral BID BM   ferrous gluconate   324 mg Oral QODAY   folic acid   1 mg Oral Daily   furosemide   80 mg Oral Daily   LORazepam   0.5 mg Oral QHS   magnesium  oxide  400 mg Oral Daily   metoprolol  succinate  25 mg Oral Daily   multivitamin with minerals  1 tablet Oral Daily   pantoprazole   40 mg Oral Daily   thiamine   100 mg Oral Daily   Or   thiamine   100 mg Intravenous Daily      Subjective:   Mackena Rieb was seen and examined today.  Sitting in the chair, feels back to her baseline, tremulousness has improved.    Objective:   Vitals:   07/05/23 0442 07/05/23 1222 07/05/23 1958 07/06/23 0423  BP: (!) 154/75 (!) 146/81 (!) 145/66 (!) 143/75  Pulse: (!) 57 70 (!) 56 (!) 59  Resp: 20 20 20 20   Temp: 97.8 F (36.6 C) 97.8 F (36.6 C) (!) 97.3 F (36.3 C) 97.8 F (36.6 C)  TempSrc: Oral Oral Oral Oral  SpO2: 97% 94% 97% 92%  Weight:      Height:        Intake/Output Summary (Last 24 hours) at 07/06/2023 1158 Last data filed at 07/05/2023 2042 Gross per 24 hour  Intake 480 ml  Output --  Net 480 ml     Wt Readings from Last 3 Encounters:  07/01/23 71.2 kg  10/22/22 72.6 kg  10/11/22 70.8 kg   Physical Exam General: Alert and oriented x 3, NAD, hearing deficit Cardiovascular: S1 S2 clear, RRR.  Respiratory: CTAB, no wheezing, rales or rhonchi Gastrointestinal: Soft, nontender, nondistended, NBS Ext: no pedal edema bilaterally Neuro: no new deficits, tremulousness improved Psych: Normal affect      Data Reviewed:  I have personally reviewed following labs     CBC Lab Results  Component Value Date   WBC 6.9 07/05/2023   RBC 3.79 (L) 07/05/2023   HGB 13.8 07/05/2023   HCT 41.7 07/05/2023   MCV 110.0 (H) 07/05/2023   MCH 36.4 (H) 07/05/2023   PLT 110 (  L) 07/05/2023   MCHC 33.1 07/05/2023   RDW 13.5 07/05/2023   LYMPHSABS 0.4 (L) 07/01/2023   MONOABS 0.4 07/01/2023   EOSABS 0.0 07/01/2023   BASOSABS 0.0 07/01/2023     Last metabolic panel Lab Results  Component Value Date   NA 133 (L) 07/05/2023   K 3.6 07/05/2023   CL 95 (L) 07/05/2023   CO2 25 07/05/2023   BUN 11 07/05/2023   CREATININE 0.64 07/05/2023   GLUCOSE 115 (H) 07/05/2023   GFRNONAA >60 07/05/2023   GFRAA 101 02/11/2020   CALCIUM  9.5 07/05/2023   PHOS 3.1 07/02/2023   PROT 7.1 07/05/2023   ALBUMIN 3.4 (L) 07/05/2023   BILITOT 1.6 (H) 07/05/2023   ALKPHOS 98 07/05/2023   AST 29 07/05/2023   ALT 20 07/05/2023   ANIONGAP 13 07/05/2023    CBG (last 3)  No results for input(s): "GLUCAP" in the last 72 hours.     Coagulation Profile: Recent Labs  Lab 07/03/23 0514  INR 1.3*     Radiology Studies: I have personally reviewed the imaging studies  No results found.      Bertram Brocks M.D. [facility] 07/06/2023, 11:58 AM  Available via Epic secure chat 7am-7pm After 7 pm, please refer to night coverage provider listed on amion.

## 2023-07-07 DIAGNOSIS — F419 Anxiety disorder, unspecified: Secondary | ICD-10-CM | POA: Diagnosis not present

## 2023-07-07 DIAGNOSIS — I10 Essential (primary) hypertension: Secondary | ICD-10-CM | POA: Diagnosis not present

## 2023-07-07 DIAGNOSIS — R251 Tremor, unspecified: Secondary | ICD-10-CM | POA: Diagnosis not present

## 2023-07-07 DIAGNOSIS — R000 Tachycardia, unspecified: Secondary | ICD-10-CM

## 2023-07-07 MED ORDER — METOPROLOL SUCCINATE ER 25 MG PO TB24: 12.5000 mg | ORAL_TABLET | Freq: Every day | ORAL | Status: DC

## 2023-07-07 NOTE — Discharge Summary (Signed)
 Physician Discharge Summary   Patient: Linda Glass MRN: [id number] DOB: 11/10/1933  Admit date:     07/01/2023  Discharge date: 07/07/23  Discharge Physician: Bertram Brocks, MD    PCP: [name], [name], MD   Recommendations at discharge:   Continue Celexa  20 mg daily a.m. Continue Ativan  0.5 mg daily at bedtime Toprol  decreased to 12.5 mg daily  Discharge Diagnoses:    Tremors   Generalized anxiety disorder  Persistent atrial fibrillation  History of alcohol use   Hypomagnesemia  Transaminitis  Chronic thrombocytopenia   High alkaline phosphatase  GERD  Hyperlipidemia  Obesity class I   Hospital Course: Patient is a 89 year old female with anxiety, A-fib on Eliquis , chronic diastolic CHF, hyperlipidemia, hypertension, arthritis, who initially presented to [facility] on 5/6 for sudden shaking all over/tremors.  Patient reported that since a day before the admission, she had felt anxious and tremulous in upper and lower extremities.  Her tremors worsened on the morning of admission and she had trouble walking and her neighbor brought her to the ED.  Patient reported that her outpatient PCP was currently weaning her Celexa , she recently started alternating 20 mg and 10 mg of Celexa  daily for 2 weeks with plan to remain on 10 mg dose after 2 weeks.  She is also on Ativan  at night to help her sleep and reported taking Ativan  at night before prior to arrival to ED. Patient was unable to ambulate in the ED hence was admitted for further workup.    Assessment and Plan:   Tremors/sudden onset of shaking - Elderly patient on Celexa  and Ativan  presenting with acute onset of bilateral hand and leg tremors.  Reported that she drinks a glass of wine at bedtime. - Patient was placed on CIWA protocol with Ativan . - Continue thiamine , folate, MVI - B12, folate, TSH within normal limits, B1 normal 176 - Counseled strongly on alcohol cessation - It appears that patient had  done well with Celexa  20 mg daily am and Ativan  0.5 mg at bedtime however was recently tapered from Celexa  with 20 mg every other day for 2 weeks then 10 mg daily.  Psychiatry was consulted - Per psychiatry, apparently patient had increased Ativan  to 1 mg at bedtime and was drinking alcohol at the same time while decreasing Celexa , which likely caused the tremors, shaking and speech difficulties. - Per psychiatry, continue the same regimen that patient had been on for years which is Celexa  20 mg daily and Ativan  0.5 mg at bedtime.  Patient was counseled strongly to quit drinking alcohol - Sitting up in the chair, doing well, tremulousness has significantly improved and patient feels back to baseline now      Persistent A-fib - Noted bradycardia, decreased Toprol -XL to 12.5 mg daily -Continue Eliquis     Chronic diastolic HF - Euvolemic, continue home dose of Lasix , 80 mg daily - Continue Toprol -XL, amlodipine       Hypertension - Continue Toprol -XL, amlodipine , furosemide        Elevated alk phos,  Transaminitis, thrombocytopenia - Initial CMP showed AST/ALT 63/34 and alk phos 159, thrombocytopenia, suggesting alcoholic liver disease - LFTs improving, AST> ALT.  Patient reports drinking 3-4 glasses of wine weekly. - The 2:1 ratio of AST/ALT likely indicate EtOH use as the cause of her transaminitis - Right upper quadrant ultrasound shows a chronic cholelithiasis, heterogenous slightly nodular appearing liver could correlate with hepatocellular disease - INR 1.3.  Counseled again on alcohol cessation.     Anxiety -  Continue Celexa  - Prior to admission, takes Ativan  0.5 mg daily at night for sleep -Appreciate psychiatry recommendations   HLD - Continue atorvastatin     GERD - Continue Protonix    Diarrhea -C. difficile panel negative, GI pathogen panel negative - Resolved   Obesity class I Estimated body mass index is 30.66 kg/m as calculated from the following:   Height as of  this encounter: 5' (1.524 m).   Weight as of this encounter: 71.2 kg.      Pain control - [place]  Controlled Substance Reporting System database was reviewed. and patient was instructed, not to drive, operate heavy machinery, perform activities at heights, swimming or participation in water activities or provide baby-sitting services while on Pain, Sleep and Anxiety Medications; until their outpatient Physician has advised to do so again. Also recommended to not to take more than prescribed Pain, Sleep and Anxiety Medications.  Consultants: Psychiatry Procedures performed: None Disposition: SNF Diet recommendation:  Discharge Diet Orders (From admission, onward)     Start     Ordered   07/07/23 0000  Diet - low sodium heart healthy        07/07/23 1035            DISCHARGE MEDICATION: Allergies as of 07/07/2023       Reactions   Losartan  Other (See Comments)   diarrhea        Medication List     TAKE these medications    acetaminophen  325 MG tablet Commonly known as: TYLENOL  Take 2 tablets (650 mg total) by mouth every 6 (six) hours as needed for mild pain (or Fever >/= 101).   amLODipine  10 MG tablet Commonly known as: NORVASC  TAKE ONE TABLET BY MOUTH DAILY   atorvastatin  40 MG tablet Commonly known as: LIPITOR TAKE 1 TABLET EACH DAY.   citalopram  20 MG tablet Commonly known as: CELEXA  Take 1 tablet (20 mg total) by mouth daily. What changed: how much to take   Eliquis  5 MG Tabs tablet Generic drug: apixaban  TAKE ONE TABLET BY MOUTH TWICE DAILY   Ferrous Gluconate  324 (37.5 Fe) MG Tabs Take 324 mg by mouth every other day.   folic acid  1 MG tablet Commonly known as: FOLVITE  Take 1 tablet (1 mg total) by mouth daily.   furosemide  80 MG tablet Commonly known as: LASIX  TAKE ONE TABLET BY MOUTH DAILY   LORazepam  0.5 MG tablet Commonly known as: ATIVAN  Take 1 tablet (0.5 mg total) by mouth at bedtime. What changed:  when to take  this reasons to take this   magnesium  oxide 400 (241.3 Mg) MG tablet Commonly known as: MAG-OX Take 0.5 tablets (200 mg total) by mouth daily. What changed: how much to take   metoprolol  succinate 25 MG 24 hr tablet Commonly known as: TOPROL -XL Take 0.5 tablets (12.5 mg total) by mouth daily. What changed: how much to take   multivitamin with minerals Tabs tablet Take 1 tablet by mouth daily.   ondansetron  4 MG tablet Commonly known as: ZOFRAN  Take 1 tablet (4 mg total) by mouth every 8 (eight) hours as needed for nausea or vomiting.   pantoprazole  40 MG tablet Commonly known as: PROTONIX  TAKE ONE TABLET BY MOUTH ONCE DAILY   thiamine  100 MG tablet Commonly known as: Vitamin B-1 Take 1 tablet (100 mg total) by mouth daily.   VITAMIN B 12 PO Take 500 mcg by mouth daily.        Follow-up Information     [name], Betty  G, MD. Schedule an appointment as soon as possible for a visit in 2 week(s).   Specialty: Family Medicine Why: for hospital follow-up Contact information: [address] [place] [facility] [place] [address] [phone]                Discharge Exam: Cleavon Curls Weights   07/01/23 1214  Weight: 71.2 kg   S: No acute complaints, sitting up in the chair, feels back to her baseline, looking forward to discharge to rehab today.  BP (!) 148/65 (BP Location: Right Arm)   Pulse (!) 49   Temp 97.6 F (36.4 C) (Oral)   Resp 17   Ht 5' (1.524 m)   Wt 71.2 kg   SpO2 97%   BMI 30.66 kg/m   Physical Exam General: Alert and oriented x 3, NAD Cardiovascular: S1 S2 clear, RRR.  Respiratory: CTAB Gastrointestinal: Soft, nontender, nondistended, NBS Ext: no pedal edema bilaterally Neuro: no new deficits Psych: Normal affect    Condition at discharge: fair  The results of significant diagnostics from this hospitalization (including imaging, microbiology, ancillary and laboratory) are listed below for reference.   Imaging Studies: CT HEAD WO CONTRAST  ( ) Result Date: 07/02/2023 CLINICAL DATA:  Mental status change, unknown cause Sudden onset of shaking/tremulous EXAM: CT HEAD WITHOUT CONTRAST TECHNIQUE: Contiguous axial images were obtained from the base of the skull through the vertex without intravenous contrast. RADIATION DOSE REDUCTION: This exam was performed according to the departmental dose-optimization program which includes automated exposure control, adjustment of the mA and/or kV according to patient size and/or use of iterative reconstruction technique. COMPARISON:  CT head 10/02/2022. FINDINGS: Brain: No evidence of acute infarction, hemorrhage, hydrocephalus, extra-axial collection or mass lesion/mass effect. Vascular: No hyperdense vessel.  Calcific atherosclerosis. Skull: No acute fracture. Sinuses/Orbits: Clear sinuses.  No acute orbital findings. Other: No mastoid effusions. IMPRESSION: No evidence of acute intracranial abnormality. Electronically Signed   By: Stevenson Elbe M.D.   On: 07/02/2023 22:21   US  Abdomen Limited RUQ (LIVER/GB) Result Date: 07/02/2023 CLINICAL DATA:  Elevated LFTs EXAM: ULTRASOUND ABDOMEN LIMITED RIGHT UPPER QUADRANT COMPARISON:  CT abdomen and pelvis September 17, 2021 FINDINGS: Gallbladder: Multiple gallstones correlate with chronic cholelithiasis Common bile duct: Diameter: 7.3 mm, normal for patient's age Liver: Echogenic heterogeneous slightly nodular appearing liver could correlate with hepatocellular disease portal vein is patent on color Doppler imaging with normal direction of blood flow towards the liver. Other: None. IMPRESSION: *Chronic cholelithiasis. *Heterogeneous slightly nodular appearing liver could correlate with hepatocellular disease. Electronically Signed   By: Fredrich Jefferson M.D.   On: 07/02/2023 08:54    Microbiology: Results for orders placed or performed during the hospital encounter of 07/01/23  C Difficile Quick Screen w PCR reflex     Status: None   Collection Time: 07/02/23  8:40  AM   Specimen: STOOL  Result Value Ref Range Status   C Diff antigen NEGATIVE NEGATIVE Final   C Diff toxin NEGATIVE NEGATIVE Final   C Diff interpretation No C. difficile detected.  Final    Comment: Performed at [facility], [address], [place], [place] [address]  Gastrointestinal Panel by PCR , Stool     Status: None   Collection Time: 07/02/23  8:40 AM   Specimen: Stool  Result Value Ref Range Status   Campylobacter species NOT DETECTED NOT DETECTED Final   Plesimonas shigelloides NOT DETECTED NOT DETECTED Final   Salmonella species NOT DETECTED NOT DETECTED Final   Yersinia enterocolitica  NOT DETECTED NOT DETECTED Final   Vibrio species NOT DETECTED NOT DETECTED Final   Vibrio cholerae NOT DETECTED NOT DETECTED Final   Enteroaggregative E coli (EAEC) NOT DETECTED NOT DETECTED Final   Enteropathogenic E coli (EPEC) NOT DETECTED NOT DETECTED Final   Enterotoxigenic E coli (ETEC) NOT DETECTED NOT DETECTED Final   Shiga like toxin producing E coli (STEC) NOT DETECTED NOT DETECTED Final   Shigella/Enteroinvasive E coli (EIEC) NOT DETECTED NOT DETECTED Final   Cryptosporidium NOT DETECTED NOT DETECTED Final   Cyclospora cayetanensis NOT DETECTED NOT DETECTED Final   Entamoeba histolytica NOT DETECTED NOT DETECTED Final   Giardia lamblia NOT DETECTED NOT DETECTED Final   Adenovirus F40/41 NOT DETECTED NOT DETECTED Final   Astrovirus NOT DETECTED NOT DETECTED Final   Norovirus GI/GII NOT DETECTED NOT DETECTED Final   Rotavirus A NOT DETECTED NOT DETECTED Final   Sapovirus (I, II, IV, and V) NOT DETECTED NOT DETECTED Final    Comment: Performed at [facility], [address] [place], [place] [address]    Labs: CBC: Recent Labs  Lab 07/01/23 1323 07/02/23 0528 07/03/23 0514 07/04/23 0515 07/05/23 0741  WBC 6.9 6.2 6.0 6.1 6.9  NEUTROABS 6.0  --   --   --   --   HGB 13.9 13.2 13.5 13.3 13.8  HCT 39.4 39.7 39.5 38.4 41.7  MCV 102.9* 107.9*  105.9* 105.8* 110.0*  PLT 132* 120* 106* 108* 110*   Basic Metabolic Panel: Recent Labs  Lab 07/01/23 1323 07/01/23 1616 07/02/23 0528 07/03/23 0514 07/04/23 0515 07/05/23 0741  NA 133* 132* 128* 133* 133* 133*  K 4.2 4.2 3.3* 3.0* 3.3* 3.6  CL 93* 94* 92* 96* 97* 95*  CO2 18* 23 22 25 26 25   GLUCOSE 145* 100* 89 101* 99 115*  BUN 18 17 11 9 9 11   CREATININE 0.81 0.68 0.44 0.34* 0.43* 0.64  CALCIUM  10.3 9.8 9.2 9.3 9.1 9.5  MG 1.3* 2.2 1.8  --   --   --   PHOS  --   --  3.1  --   --   --    Liver Function Tests: Recent Labs  Lab 07/01/23 1616 07/02/23 0528 07/03/23 0514 07/04/23 0515 07/05/23 0741  AST 56* 52* 43* 33 29  ALT 29 28 25 21 20   ALKPHOS 143* 116 106 95 98  BILITOT 1.1 2.1* 1.7* 2.1* 1.6*  PROT 6.6 6.8 6.7 6.5 7.1  ALBUMIN 4.1 3.5 3.4* 3.1* 3.4*   CBG: Recent Labs  Lab 07/03/23 0728  GLUCAP 115*    Discharge time spent: greater than 30 minutes.  Signed: Bertram Brocks, MD [facility] 07/07/2023

## 2023-07-07 NOTE — Plan of Care (Signed)
  Problem: Clinical Measurements: Goal: Ability to maintain clinical measurements within normal limits will improve Outcome: Progressing   Problem: Clinical Measurements: Goal: Will remain free from infection Outcome: Progressing   Problem: Clinical Measurements: Goal: Diagnostic test results will improve Outcome: Progressing   Problem: Clinical Measurements: Goal: Respiratory complications will improve Outcome: Progressing   Problem: Clinical Measurements: Goal: Cardiovascular complication will be avoided Outcome: Progressing   Problem: Activity: Goal: Risk for activity intolerance will decrease Outcome: Progressing   

## 2023-07-07 NOTE — Progress Notes (Signed)
 AVS reviewed w/ pt & daughter - both verbalized an understanding. Gold DNR, AVS & D/C summary placed in D/C envelope for [facility] - pt's daughter providing transportation. Pt dressed and ready for d/c. Eliquis  also due tonight. Pt to lobby via w/c

## 2023-07-07 NOTE — TOC Transition Note (Addendum)
 Transition of Care [facility]) - Discharge Note   Patient Details  Name: Linda Glass MRN: [id number] Date of Birth: 1933-10-21  [facility] of [facility]) CM/SW Contact:  Ruben Corolla, RN Phone Number: 07/07/2023, 10:49 AM   Clinical Narrative: [name] received.d/c today awaiting dc summary & [facility] rep Daril Edge to confirm bed available, rm#,report #.. Spoke to Richard(Son) about transport to [facility] will transport on own.  -12:33p-Going to [facility] rm#[id number],report#[phone]. Family to transport on own. DNR needs to be placed in packet. No further CM needs.     Final next level of care: Skilled Nursing Facility Barriers to Discharge: No Barriers Identified   Patient Goals and CMS Choice Patient states their goals for this hospitalization and ongoing recovery are:: Rehab CMS Medicare.gov Compare Post Acute Care list provided to:: Patient Represenative (must comment) Choice offered to / list presented to : Adult Children [place] ownership interest in [facility].provided to:: Adult Children    Discharge Placement                       Discharge Plan and Services Additional resources added to the After Visit Summary for     Discharge Planning Services: CM Consult Post Acute Care Choice: Home Health                    HH Arranged: PT HH Agency: [facility]        Social Drivers of Health (SDOH) Interventions SDOH Screenings   Food Insecurity: No Food Insecurity (07/01/2023)  Housing: Low Risk  (07/01/2023)  Transportation Needs: No Transportation Needs (07/01/2023)  Utilities: Not At Risk (07/01/2023)  Alcohol Screen: Low Risk  (08/21/2022)  Depression (PHQ2-9): Low Risk  (10/22/2022)  Financial Resource Strain: Low Risk  (08/21/2022)  Physical Activity: Insufficiently Active (08/21/2022)  Social Connections: Moderately Isolated (07/01/2023)  Stress: No Stress Concern Present (08/21/2022)  Tobacco Use: Low Risk   (07/01/2023)     Readmission Risk Interventions     No data to display

## 2023-07-07 NOTE — Progress Notes (Signed)
 Verbal report given to [facility], Facilities manager @ [place].

## 2023-07-15 ENCOUNTER — Encounter (HOSPITAL_COMMUNITY): Payer: Self-pay

## 2023-07-15 ENCOUNTER — Emergency Department (HOSPITAL_COMMUNITY)

## 2023-07-15 ENCOUNTER — Inpatient Hospital Stay (HOSPITAL_COMMUNITY)
Admission: EM | Admit: 2023-07-15 | Discharge: 2023-07-21 | DRG: 327 | Disposition: A | Discharge status: Home Health | Source: Skilled Nursing Facility | Attending: Family Medicine | Admitting: Family Medicine

## 2023-07-15 ENCOUNTER — Other Ambulatory Visit: Payer: Self-pay

## 2023-07-15 DIAGNOSIS — Z888 Allergy status to other drugs, medicaments and biological substances status: Secondary | ICD-10-CM

## 2023-07-15 DIAGNOSIS — E871 Hypo-osmolality and hyponatremia: Secondary | ICD-10-CM | POA: Diagnosis not present

## 2023-07-15 DIAGNOSIS — E876 Hypokalemia: Principal | ICD-10-CM | POA: Diagnosis present

## 2023-07-15 DIAGNOSIS — Z83438 Family history of other disorder of lipoprotein metabolism and other lipidemia: Secondary | ICD-10-CM

## 2023-07-15 DIAGNOSIS — Z7901 Long term (current) use of anticoagulants: Secondary | ICD-10-CM

## 2023-07-15 DIAGNOSIS — Z90710 Acquired absence of both cervix and uterus: Secondary | ICD-10-CM | POA: Diagnosis not present

## 2023-07-15 DIAGNOSIS — Z01818 Encounter for other preprocedural examination: Secondary | ICD-10-CM | POA: Diagnosis not present

## 2023-07-15 DIAGNOSIS — K3189 Other diseases of stomach and duodenum: Principal | ICD-10-CM | POA: Diagnosis present

## 2023-07-15 DIAGNOSIS — K259 Gastric ulcer, unspecified as acute or chronic, without hemorrhage or perforation: Secondary | ICD-10-CM | POA: Diagnosis present

## 2023-07-15 DIAGNOSIS — I340 Nonrheumatic mitral (valve) insufficiency: Secondary | ICD-10-CM | POA: Diagnosis present

## 2023-07-15 DIAGNOSIS — N9489 Other specified conditions associated with female genital organs and menstrual cycle: Secondary | ICD-10-CM | POA: Diagnosis present

## 2023-07-15 DIAGNOSIS — I10 Essential (primary) hypertension: Secondary | ICD-10-CM | POA: Diagnosis present

## 2023-07-15 DIAGNOSIS — K8020 Calculus of gallbladder without cholecystitis without obstruction: Secondary | ICD-10-CM | POA: Diagnosis present

## 2023-07-15 DIAGNOSIS — F411 Generalized anxiety disorder: Secondary | ICD-10-CM | POA: Diagnosis present

## 2023-07-15 DIAGNOSIS — R112 Nausea with vomiting, unspecified: Secondary | ICD-10-CM

## 2023-07-15 DIAGNOSIS — I110 Hypertensive heart disease with heart failure: Secondary | ICD-10-CM | POA: Diagnosis present

## 2023-07-15 DIAGNOSIS — H9190 Unspecified hearing loss, unspecified ear: Secondary | ICD-10-CM | POA: Diagnosis present

## 2023-07-15 DIAGNOSIS — I4821 Permanent atrial fibrillation: Secondary | ICD-10-CM | POA: Diagnosis present

## 2023-07-15 DIAGNOSIS — I4819 Other persistent atrial fibrillation: Secondary | ICD-10-CM | POA: Diagnosis not present

## 2023-07-15 DIAGNOSIS — Z79899 Other long term (current) drug therapy: Secondary | ICD-10-CM

## 2023-07-15 DIAGNOSIS — I4891 Unspecified atrial fibrillation: Secondary | ICD-10-CM | POA: Diagnosis not present

## 2023-07-15 DIAGNOSIS — I5032 Chronic diastolic (congestive) heart failure: Secondary | ICD-10-CM | POA: Diagnosis present

## 2023-07-15 DIAGNOSIS — Z66 Do not resuscitate: Secondary | ICD-10-CM | POA: Diagnosis present

## 2023-07-15 DIAGNOSIS — K660 Peritoneal adhesions (postprocedural) (postinfection): Secondary | ICD-10-CM | POA: Diagnosis present

## 2023-07-15 DIAGNOSIS — E785 Hyperlipidemia, unspecified: Secondary | ICD-10-CM | POA: Diagnosis present

## 2023-07-15 DIAGNOSIS — Z8711 Personal history of peptic ulcer disease: Secondary | ICD-10-CM

## 2023-07-15 DIAGNOSIS — I341 Nonrheumatic mitral (valve) prolapse: Secondary | ICD-10-CM | POA: Diagnosis present

## 2023-07-15 DIAGNOSIS — E66811 Obesity, class 1: Secondary | ICD-10-CM | POA: Diagnosis present

## 2023-07-15 DIAGNOSIS — Z8781 Personal history of (healed) traumatic fracture: Secondary | ICD-10-CM

## 2023-07-15 DIAGNOSIS — K440 Diaphragmatic hernia with obstruction, without gangrene: Secondary | ICD-10-CM | POA: Diagnosis present

## 2023-07-15 DIAGNOSIS — I482 Chronic atrial fibrillation, unspecified: Secondary | ICD-10-CM | POA: Insufficient documentation

## 2023-07-15 DIAGNOSIS — D6859 Other primary thrombophilia: Secondary | ICD-10-CM | POA: Diagnosis present

## 2023-07-15 DIAGNOSIS — Z8616 Personal history of COVID-19: Secondary | ICD-10-CM

## 2023-07-15 DIAGNOSIS — K219 Gastro-esophageal reflux disease without esophagitis: Secondary | ICD-10-CM | POA: Diagnosis present

## 2023-07-15 DIAGNOSIS — F419 Anxiety disorder, unspecified: Secondary | ICD-10-CM

## 2023-07-15 DIAGNOSIS — Z9181 History of falling: Secondary | ICD-10-CM

## 2023-07-15 DIAGNOSIS — Z87440 Personal history of urinary (tract) infections: Secondary | ICD-10-CM

## 2023-07-15 DIAGNOSIS — Z9989 Dependence on other enabling machines and devices: Secondary | ICD-10-CM

## 2023-07-15 DIAGNOSIS — Z8719 Personal history of other diseases of the digestive system: Secondary | ICD-10-CM

## 2023-07-15 LAB — COMPREHENSIVE METABOLIC PANEL WITH GFR
ALT: 23 U/L (ref 0–44)
AST: 32 U/L (ref 15–41)
Albumin: 3.8 g/dL (ref 3.5–5.0)
Alkaline Phosphatase: 120 U/L (ref 38–126)
Anion gap: 15 (ref 5–15)
BUN: 12 mg/dL (ref 8–23)
CO2: 28 mmol/L (ref 22–32)
Calcium: 9.7 mg/dL (ref 8.9–10.3)
Chloride: 93 mmol/L — ABNORMAL LOW (ref 98–111)
Creatinine, Ser: 0.53 mg/dL (ref 0.44–1.00)
GFR, Estimated: >60 mL/min (ref >60)
Glucose, Bld: 129 mg/dL — ABNORMAL HIGH (ref 70–99)
Potassium: 2.4 mmol/L — CL (ref 3.5–5.1)
Sodium: 136 mmol/L (ref 135–145)
Total Bilirubin: 1 mg/dL (ref 0.0–1.2)
Total Protein: 7.5 g/dL (ref 6.5–8.1)

## 2023-07-15 LAB — URINALYSIS, ROUTINE W REFLEX MICROSCOPIC
Bilirubin Urine: NEGATIVE
Glucose, UA: NEGATIVE mg/dL
Hgb urine dipstick: NEGATIVE
Ketones, ur: NEGATIVE mg/dL
Leukocytes,Ua: NEGATIVE
Nitrite: NEGATIVE
Protein, ur: NEGATIVE mg/dL
Specific Gravity, Urine: 1.016 (ref 1.005–1.030)
pH: 6 (ref 5.0–8.0)

## 2023-07-15 LAB — LIPASE, BLOOD: Lipase: 47 U/L (ref 11–51)

## 2023-07-15 LAB — CBC
HCT: 40.7 % (ref 36.0–46.0)
Hemoglobin: 14 g/dL (ref 12.0–15.0)
MCH: 35.4 pg — ABNORMAL HIGH (ref 26.0–34.0)
MCHC: 34.4 g/dL (ref 30.0–36.0)
MCV: 103 fL — ABNORMAL HIGH (ref 80.0–100.0)
Platelets: 233 10*3/uL (ref 150–400)
RBC: 3.95 MIL/uL (ref 3.87–5.11)
RDW: 12.7 % (ref 11.5–15.5)
WBC: 8.3 10*3/uL (ref 4.0–10.5)
nRBC: 0 % (ref 0.0–0.2)

## 2023-07-15 LAB — MAGNESIUM: Magnesium: 1.4 mg/dL — ABNORMAL LOW (ref 1.7–2.4)

## 2023-07-15 MED ORDER — LORAZEPAM 0.5 MG PO TABS
0.5000 mg | ORAL_TABLET | Freq: Four times a day (QID) | ORAL | Status: DC | PRN
Start: 2023-07-15 — End: 2023-07-16

## 2023-07-15 MED ORDER — FOLIC ACID 1 MG PO TABS: 1.0000 mg | ORAL_TABLET | Freq: Every day | ORAL | Status: DC

## 2023-07-15 MED ORDER — ACETAMINOPHEN 650 MG RE SUPP: 650.0000 mg | Freq: Four times a day (QID) | RECTAL | Status: DC | PRN

## 2023-07-15 MED ORDER — THIAMINE MONONITRATE 100 MG PO TABS
100.0000 mg | ORAL_TABLET | Freq: Every day | ORAL | Status: DC
  Administered 2023-07-18 – 2023-07-21 (×4): 100 mg via ORAL
  Filled 2023-07-15 (×5): qty 1

## 2023-07-15 MED ORDER — ADULT MULTIVITAMIN W/MINERALS CH
1.0000 | ORAL_TABLET | Freq: Every day | ORAL | Status: DC
  Administered 2023-07-18 – 2023-07-21 (×4): 1 via ORAL
  Filled 2023-07-15 (×5): qty 1

## 2023-07-15 MED ORDER — SODIUM CHLORIDE 0.9 % IV SOLN: INTRAVENOUS | Status: AC

## 2023-07-15 MED ORDER — ONDANSETRON HCL 4 MG/2ML IJ SOLN
4.0000 mg | Freq: Once | INTRAMUSCULAR | Status: AC | PRN
  Administered 2023-07-15: 4 mg via INTRAVENOUS
  Filled 2023-07-15: qty 2

## 2023-07-15 MED ORDER — MAGIC MOUTHWASH: 15.0000 mL | Freq: Four times a day (QID) | ORAL | Status: DC | PRN

## 2023-07-15 MED ORDER — PHENOL 1.4 % MT LIQD
2.0000 | OROMUCOSAL | Status: DC | PRN
Start: 2023-07-15 — End: 2023-07-21

## 2023-07-15 MED ORDER — DIPHENHYDRAMINE HCL 50 MG/ML IJ SOLN: 12.5000 mg | Freq: Four times a day (QID) | INTRAMUSCULAR | Status: DC | PRN

## 2023-07-15 MED ORDER — MAGNESIUM SULFATE 2 GM/50ML IV SOLN
2.0000 g | Freq: Once | INTRAVENOUS | Status: AC
  Administered 2023-07-15: 2 g via INTRAVENOUS
  Filled 2023-07-15: qty 50

## 2023-07-15 MED ORDER — THIAMINE HCL 100 MG/ML IJ SOLN
100.0000 mg | Freq: Every day | INTRAMUSCULAR | Status: DC
  Administered 2023-07-15 – 2023-07-16 (×2): 100 mg via INTRAVENOUS
  Filled 2023-07-15 (×3): qty 2

## 2023-07-15 MED ORDER — OXYCODONE HCL 5 MG PO TABS
5.0000 mg | ORAL_TABLET | ORAL | Status: DC | PRN
  Administered 2023-07-17 – 2023-07-19 (×3): 5 mg via ORAL
  Filled 2023-07-15 (×3): qty 1

## 2023-07-15 MED ORDER — ALUM & MAG HYDROXIDE-SIMETH 200-200-20 MG/5ML PO SUSP: 30.0000 mL | Freq: Four times a day (QID) | ORAL | Status: DC | PRN

## 2023-07-15 MED ORDER — HYDROMORPHONE HCL 1 MG/ML IJ SOLN
0.5000 mg | INTRAMUSCULAR | Status: DC | PRN
  Administered 2023-07-18: 1 mg via INTRAVENOUS
  Filled 2023-07-15: qty 1

## 2023-07-15 MED ORDER — POTASSIUM CHLORIDE 10 MEQ/100ML IV SOLN
10.0000 meq | INTRAVENOUS | Status: AC
  Administered 2023-07-15 (×4): 10 meq via INTRAVENOUS
  Filled 2023-07-15 (×3): qty 100

## 2023-07-15 MED ORDER — FOLIC ACID 1 MG PO TABS
1.0000 mg | ORAL_TABLET | Freq: Every day | ORAL | Status: DC
  Administered 2023-07-15 – 2023-07-21 (×5): 1 mg
  Filled 2023-07-15 (×6): qty 1

## 2023-07-15 MED ORDER — LEVALBUTEROL HCL 1.25 MG/0.5ML IN NEBU: 1.2500 mg | INHALATION_SOLUTION | Freq: Four times a day (QID) | RESPIRATORY_TRACT | Status: DC | PRN

## 2023-07-15 MED ORDER — POTASSIUM CHLORIDE 10 MEQ/100ML IV SOLN: 10.0000 meq | INTRAVENOUS | Status: DC

## 2023-07-15 MED ORDER — FAMOTIDINE IN NACL 20-0.9 MG/50ML-% IV SOLN
20.0000 mg | Freq: Once | INTRAVENOUS | Status: AC
  Administered 2023-07-15: 20 mg via INTRAVENOUS
  Filled 2023-07-15: qty 50

## 2023-07-15 MED ORDER — LORAZEPAM 2 MG/ML PO CONC: 0.5000 mg | Freq: Four times a day (QID) | ORAL | Status: DC | PRN

## 2023-07-15 MED ORDER — PROCHLORPERAZINE EDISYLATE 10 MG/2ML IJ SOLN
5.0000 mg | INTRAMUSCULAR | Status: DC | PRN
Start: 2023-07-15 — End: 2023-07-21
  Administered 2023-07-17: 10 mg via INTRAVENOUS
  Filled 2023-07-15: qty 2

## 2023-07-15 MED ORDER — METHOCARBAMOL 1000 MG/10ML IJ SOLN
500.0000 mg | Freq: Three times a day (TID) | INTRAMUSCULAR | Status: DC
  Administered 2023-07-15 – 2023-07-21 (×13): 500 mg via INTRAVENOUS
  Filled 2023-07-15 (×16): qty 10

## 2023-07-15 MED ORDER — HYDRALAZINE HCL 20 MG/ML IJ SOLN
5.0000 mg | Freq: Four times a day (QID) | INTRAMUSCULAR | Status: DC | PRN
  Administered 2023-07-21: 5 mg via INTRAVENOUS
  Filled 2023-07-15: qty 1

## 2023-07-15 MED ORDER — ACETAMINOPHEN 325 MG PO TABS: 650.0000 mg | ORAL_TABLET | Freq: Four times a day (QID) | ORAL | Status: DC | PRN

## 2023-07-15 MED ORDER — LACTATED RINGERS IV BOLUS
1000.0000 mL | Freq: Once | INTRAVENOUS | Status: AC
  Administered 2023-07-15: 1000 mL via INTRAVENOUS

## 2023-07-15 MED ORDER — SIMETHICONE 80 MG PO CHEW
80.0000 mg | CHEWABLE_TABLET | Freq: Four times a day (QID) | ORAL | Status: AC
  Administered 2023-07-17 – 2023-07-18 (×5): 80 mg via ORAL
  Filled 2023-07-15 (×7): qty 1

## 2023-07-15 MED ORDER — PANTOPRAZOLE SODIUM 40 MG IV SOLR
40.0000 mg | Freq: Two times a day (BID) | INTRAVENOUS | Status: DC
  Administered 2023-07-15 – 2023-07-21 (×11): 40 mg via INTRAVENOUS
  Filled 2023-07-15 (×11): qty 10

## 2023-07-15 MED ORDER — SALINE SPRAY 0.65 % NA SOLN: 1.0000 | Freq: Four times a day (QID) | NASAL | Status: DC | PRN

## 2023-07-15 MED ORDER — BISACODYL 10 MG RE SUPP: 10.0000 mg | Freq: Two times a day (BID) | RECTAL | Status: DC | PRN

## 2023-07-15 MED ORDER — LACTATED RINGERS IV BOLUS: 1000.0000 mL | Freq: Three times a day (TID) | INTRAVENOUS | Status: AC | PRN

## 2023-07-15 MED ORDER — IOHEXOL 300 MG/ML  SOLN
100.0000 mL | Freq: Once | INTRAMUSCULAR | Status: AC | PRN
  Administered 2023-07-15: 100 mL via INTRAVENOUS

## 2023-07-15 MED ORDER — NAPHAZOLINE-GLYCERIN 0.012-0.25 % OP SOLN: 1.0000 [drp] | Freq: Four times a day (QID) | OPHTHALMIC | Status: DC | PRN

## 2023-07-15 MED ORDER — ONDANSETRON HCL 4 MG/2ML IJ SOLN
4.0000 mg | Freq: Four times a day (QID) | INTRAMUSCULAR | Status: DC | PRN
  Filled 2023-07-15: qty 2

## 2023-07-15 MED ORDER — PANTOPRAZOLE SODIUM 40 MG IV SOLR: 40.0000 mg | INTRAVENOUS | Status: DC

## 2023-07-15 MED ORDER — POTASSIUM CHLORIDE 10 MEQ/100ML IV SOLN
10.0000 meq | INTRAVENOUS | Status: AC
  Administered 2023-07-15 (×3): 10 meq via INTRAVENOUS
  Filled 2023-07-15 (×3): qty 100

## 2023-07-15 MED ORDER — MENTHOL 3 MG MT LOZG
1.0000 | LOZENGE | OROMUCOSAL | Status: DC | PRN
Start: 2023-07-15 — End: 2023-07-21

## 2023-07-15 NOTE — ED Provider Notes (Signed)
 Patient signed out to me by Dr. Gordon Latus pending results of abdominal CT which showed a gastric virus.  Case discussed with general surgery.  Recommend NG tube placement and medical admission.  Patient informed   Lind Repine, MD 07/15/23 [id number]

## 2023-07-15 NOTE — ED Triage Notes (Signed)
 BIBA from [facility] for vomiting since last night,upper abdominal pain. 178/90 BP 85 HR 115 cbg

## 2023-07-15 NOTE — ED Provider Notes (Signed)
 [facility] EMERGENCY DEPARTMENT AT [facility] Provider Note   CSN: [id number] Arrival date & time: 07/15/23  1250     History  Chief Complaint  Patient presents with   Emesis    Linda Glass is a 89 y.o. female with a history of A-fib on Eliquis , heart failure, anxiety, chronic thrombocytopenia, elevated alk phos, hypomagnesemia, presenting to the ED with concern for nausea and vomiting.  Patient reports she was discharged to [facility] about a week ago, which I confirmed on the records on May 12.  She returns today with report of acute onset of epigastric abdominal pain, nausea and vomiting that began last night.  She has dark vomit.  She reports she had a loose stool earlier today but not persistent.  Per review of her record she was more recently hospitalized for generalized weakness, difficulty ambulating in the ED, tremors and episodes of general shaking, who symptoms improved in the ED.  She is on Eliquis  and decreased dose now of metoprolol  XL 12.5 mg daily for persistent A-fib.  She is on Lasix  80 mg daily for heart failure.  There was concern for alcoholic liver disease with basic CMP and minor elevated alk phos during her stay in the hospital.  She did have contracted gallbladder around gallstones noted on right upper quadrant ultrasound.  HPI     Home Medications Prior to Admission medications   Medication Sig Start Date End Date Taking? Authorizing Provider  acetaminophen  (TYLENOL ) 325 MG tablet Take 2 tablets (650 mg total) by mouth every 6 (six) hours as needed for mild pain (or Fever >/= 101). 09/21/21   Sheikh, Omair Latif, DO  amLODipine  (NORVASC ) 10 MG tablet TAKE ONE TABLET BY MOUTH DAILY 11/04/22   Maudine Sos, MD  apixaban  (ELIQUIS ) 5 MG TABS tablet TAKE ONE TABLET BY MOUTH TWICE DAILY 05/30/23   [name], [name], MD  atorvastatin  (LIPITOR) 40 MG tablet TAKE 1 TABLET EACH DAY. 05/12/23   [name], [name], MD  citalopram  (CELEXA ) 20 MG  tablet Take 1 tablet (20 mg total) by mouth daily. 07/05/23   Rai, Hurman Maiden, MD  Cyanocobalamin  (VITAMIN B 12 PO) Take 500 mcg by mouth daily.    [provider]  Ferrous Gluconate  324 (37.5 Fe) MG TABS Take 324 mg by mouth every other day.    [provider]  folic acid  (FOLVITE ) 1 MG tablet Take 1 tablet (1 mg total) by mouth daily. 07/06/23   Rai, Hurman Maiden, MD  furosemide  (LASIX ) 80 MG tablet TAKE ONE TABLET BY MOUTH DAILY 10/21/22   Maudine Sos, MD  LORazepam  (ATIVAN ) 0.5 MG tablet Take 1 tablet (0.5 mg total) by mouth at bedtime. 07/05/23   Rai, Ripudeep K, MD  magnesium  oxide (MAG-OX) 400 (241.3 Mg) MG tablet Take 0.5 tablets (200 mg total) by mouth daily. Patient taking differently: Take 400 mg by mouth daily. 05/12/20   [place], Rema Care, DO  metoprolol  succinate (TOPROL -XL) 25 MG 24 hr tablet Take 0.5 tablets (12.5 mg total) by mouth daily. 07/07/23   Rai, Hurman Maiden, MD  Multiple Vitamin (MULTIVITAMIN WITH MINERALS) TABS tablet Take 1 tablet by mouth daily.    [provider]  ondansetron  (ZOFRAN ) 4 MG tablet Take 1 tablet (4 mg total) by mouth every 8 (eight) hours as needed for nausea or vomiting. 07/05/23   Rai, Hurman Maiden, MD  pantoprazole  (PROTONIX ) 40 MG tablet TAKE ONE TABLET BY MOUTH ONCE DAILY 12/16/22   [name], [name], MD  thiamine  (VITAMIN B-1) 100 MG tablet Take 1 tablet (100 mg total) by mouth daily. 07/06/23   Rai, Hurman Maiden, MD      Allergies    Losartan     Review of Systems   Review of Systems  Physical Exam Updated Vital Signs BP (!) 161/87   Pulse 73   Temp 98 F (36.7 C) (Oral)   Resp 18   SpO2 98%  Physical Exam Constitutional:      General: She is not in acute distress. HENT:     Head: Normocephalic and atraumatic.  Eyes:     Conjunctiva/sclera: Conjunctivae normal.     Pupils: Pupils are equal, round, and reactive to light.  Cardiovascular:     Rate and Rhythm: Normal rate and regular rhythm.  Pulmonary:     Effort:  Pulmonary effort is normal. No respiratory distress.  Abdominal:     General: There is no distension.     Tenderness: There is abdominal tenderness in the left lower quadrant.  Skin:    General: Skin is warm and dry.  Neurological:     General: No focal deficit present.     Mental Status: She is alert. Mental status is at baseline.  Psychiatric:        Mood and Affect: Mood normal.        Behavior: Behavior normal.     ED Results / Procedures / Treatments   Labs (all labs ordered are listed, but only abnormal results are displayed) Labs Reviewed  COMPREHENSIVE METABOLIC PANEL WITH GFR - Abnormal; Notable for the following components:      Result Value   Potassium 2.4 (*)    Chloride 93 (*)    Glucose, Bld 129 (*)    All other components within normal limits  CBC - Abnormal; Notable for the following components:   MCV 103.0 (*)    MCH 35.4 (*)    All other components within normal limits  URINALYSIS, ROUTINE W REFLEX MICROSCOPIC - Abnormal; Notable for the following components:   Color, Urine STRAW (*)    All other components within normal limits  MAGNESIUM  - Abnormal; Notable for the following components:   Magnesium  1.4 (*)    All other components within normal limits  LIPASE, BLOOD    EKG EKG Interpretation Date/Time:  Tuesday Jul 15 2023 13:04:04 EDT Ventricular Rate:  73 PR Interval:    QRS Duration:  123 QT Interval:  558 QTC Calculation: 615 R Axis:   -3  Text Interpretation: Atrial fibrillation Right bundle branch block Confirmed by Jerald Molly [phone]) on 07/15/2023 1:07:42 PM  Radiology No results found.  Procedures .Critical Care  Performed by: Arvilla Birmingham, MD Authorized by: Arvilla Birmingham, MD   Critical care provider statement:    Critical care time (minutes):  45   Critical care time was exclusive of:  Separately billable procedures and treating other patients   Critical care was necessary to treat or prevent imminent or  life-threatening deterioration of the following conditions:  Metabolic crisis   Critical care was time spent personally by me on the following activities:  Ordering and performing treatments and interventions, ordering and review of laboratory studies, ordering and review of radiographic studies, pulse oximetry, review of old charts, examination of patient and evaluation of patient's response to treatment   Care discussed with: admitting provider   Comments:     Hypokalemia and hypomagnesemia treatment     Medications Ordered in ED Medications  potassium chloride   10 mEq in 100 mL IVPB (10 mEq Intravenous New Bag/Given 07/15/23 1430)  ondansetron  (ZOFRAN ) injection 4 mg (4 mg Intravenous Given 07/15/23 1306)  famotidine (PEPCID) IVPB 20 mg premix (0 mg Intravenous Stopped 07/15/23 1342)  magnesium  sulfate IVPB 2 g 50 mL (2 g Intravenous New Bag/Given 07/15/23 1431)  iohexol  (OMNIPAQUE ) 300 MG/ML solution 100 mL (100 mLs Intravenous Contrast Given 07/15/23 1415)    ED Course/ Medical Decision Making/ A&P Clinical Course as of 07/15/23 1531  Tue Jul 15, 2023  1402 Magnesium (!): 1.4 [MT]  1402 Potassium(!!): 2.4 [MT]  1529 Patient signed out to Dr. Lind Repine EDP pending likely admission after CT imaging  [MT]    Clinical Course User Index [MT] Revis Whalin, Janalyn Me, MD                                 Medical Decision Making Amount and/or Complexity of Data Reviewed Labs: ordered. Decision-making details documented in ED Course. Radiology: ordered.  Risk Prescription drug management.   This patient presents to the ED with concern for nausea and vomiting abdominal pain. This involves an extensive number of treatment options, and is a complaint that carries with it a high risk of complications and morbidity.  The differential diagnosis includes gastritis versus colitis versus pancreatitis versus biliary disease versus other  Co-morbidities that complicate the patient evaluation: History  of gallstone  Additional history obtained from EMS  External records from outside source obtained and reviewed including hospital discharge summary from earlier this month  I ordered and personally interpreted labs.  The pertinent results include: Hypokalemia and hypomagnesemia.  White blood cell count normal.  Hemoglobin normal.  I ordered imaging studies including CT of the abdomen and pelvis, which was pending at the time of signout  The patient was maintained on a cardiac monitor.  I personally viewed and interpreted the cardiac monitored which showed an underlying rhythm of: A Fib rate controlled  Per my interpretation the patient's ECG shows A fib rate controlled without acute ischemia but prolonged Qtc noted  I ordered medication including  IV K and IV mag and IV zofran  for electrolyte derangement and nausea  I have reviewed the patients home medicines and have made adjustments as needed  Test Considered: doubt acute PE   After the interventions noted above, I reevaluated the patient and found that they have: stayed the same          Final Clinical Impression(s) / ED Diagnoses Final diagnoses:  Hypokalemia  Hypomagnesemia  Nausea and vomiting, unspecified vomiting type    Rx / DC Orders ED Discharge Orders     None         Arvilla Birmingham, MD 07/15/23 [phone]

## 2023-07-15 NOTE — Consult Note (Addendum)
 Linda Glass 10-31-1933  [id number].    Requesting MD: Dr. Lind Repine Chief Complaint/Reason for Consult: gastric volvulus  HPI:  This is an 89 yo female with a history of HTN, HLD, small hiatal hernia, a fib on Eliquis  (LD 5/20 am), CHF, anxiety, and chronic thrombocytopenia, who was recently brought into the ED from [facility] last night. She was recently discharged after an admission for tremors and generalized weakness. She began having acute onset nausea and vomiting last night. Says the rehab facility gave her meds and it got a little better but then she started vomiting again this morning and was brought to the ED.  She is still having bowel movements. She reports drinking a glass of wine a few times per week at night. Denies tobacco use. Reports history of hysterectomy in the 80's. Says her last colonoscopy she had a large polyp and then had a lap assisted right colectomy 03/12/2021 by Dr. Marny Sires. States her daughter and son-in-law recently re-located to this area from [place] .  Says she has been walking with a walker at the rehab facility.   ROS: Review of Systems  All other systems reviewed and are negative.   Family History  Problem Relation Age of Onset   Hyperlipidemia Mother    Diabetes Neg Hx     Past Medical History:  Diagnosis Date   Anemia 04/2020   Anxiety    Arthritis    Chicken pox    COVID-19    Heart murmur    Hyperlipidemia    Hypertension     Past Surgical History:  Procedure Laterality Date   ABDOMINAL HYSTERECTOMY  1983   BIOPSY  05/05/2020   Procedure: BIOPSY;  Surgeon: Sergio Dandy, MD;  Location: [facility];  Service: Endoscopy;;  EGD and COLON   COLONOSCOPY WITH PROPOFOL  N/A 05/05/2020   Procedure: COLONOSCOPY WITH PROPOFOL ;  Surgeon: Sergio Dandy, MD;  Location: [facility];  Service: Endoscopy;  Laterality: N/A;   ESOPHAGOGASTRODUODENOSCOPY (EGD) WITH PROPOFOL  N/A 05/05/2020   Procedure:  ESOPHAGOGASTRODUODENOSCOPY (EGD) WITH PROPOFOL ;  Surgeon: Sergio Dandy, MD;  Location: [facility];  Service: Endoscopy;  Laterality: N/A;   FRACTURE SURGERY     wrist fracture 2013   LAPAROSCOPIC PARTIAL COLECTOMY Right 03/12/2021   Procedure: LAPAROSCOPIC HAND ASSISTED RIGHT COLECTOMY;  Surgeon: Junie Olds, MD;  Location: [facility];  Service: General;  Laterality: Right;    Social History:  reports that she has never smoked. She has never used smokeless tobacco. She reports that she does not currently use alcohol after a past usage of about 7.0 standard drinks of alcohol per week. She reports that she does not use drugs.  Allergies:  Allergies  Allergen Reactions   Losartan  Other (See Comments)    diarrhea    (Not in a hospital admission)    Physical Exam: Blood pressure (!) 150/90, pulse 60, temperature 98 F (36.7 C), temperature source Oral, resp. rate 19, SpO2 95%. General: Pleasant white female laying on hospital bed, appears stated age, NAD. HEENT: head -normocephalic, atraumatic; Eyes: PERRLA, no conjunctival injection; hard of hearing Neck- Trachea is midline, CV- RRR, normal S1/S2, no M/R/G, there is mild symmetrical lower extremity edema  Pulm- breathing is non-labored ORA Abd- soft, NT/ND, no masses, hernias, or organomegaly. Previous infraumbilical laparotomy scar GU- deferred  MSK- UE/LE symmetrical, no cyanosis, clubbing, or edema. Neuro- CN II-XII grossly in tact, no paresthesias. Psych- Alert and Oriented x3 with appropriate affect Skin: warm and  dry, no rashes or lesions   Results for orders placed or performed during the hospital encounter of 07/15/23 (from the past 48 hours)  Lipase, blood     Status: None   Collection Time: 07/15/23  1:08 PM  Result Value Ref Range   Lipase 47 11 - 51 U/L    Comment: Performed at [facility], [address], [place], [place] [address]  Comprehensive metabolic panel     Status: Abnormal    Collection Time: 07/15/23  1:08 PM  Result Value Ref Range   Sodium 136 135 - 145 mmol/L   Potassium 2.4 (LL) 3.5 - 5.1 mmol/L    Comment: CRITICAL RESULT CALLED TO, READ BACK BY AND VERIFIED WITH LEWIS,M. RN AT 1401 07/15/23 MULLINS,T    Chloride 93 (L) 98 - 111 mmol/L   CO2 28 22 - 32 mmol/L   Glucose, Bld 129 (H) 70 - 99 mg/dL    Comment: Glucose reference range applies only to samples taken after fasting for at least 8 hours.   BUN 12 8 - 23 mg/dL   Creatinine, Ser 6.04 0.44 - 1.00 mg/dL   Calcium  9.7 8.9 - 10.3 mg/dL   Total Protein 7.5 6.5 - 8.1 g/dL   Albumin 3.8 3.5 - 5.0 g/dL   AST 32 15 - 41 U/L   ALT 23 0 - 44 U/L   Alkaline Phosphatase 120 38 - 126 U/L   Total Bilirubin 1.0 0.0 - 1.2 mg/dL   GFR, Estimated >54 >09 mL/min    Comment: (NOTE) Calculated using the CKD-EPI Creatinine Equation (2021)    Anion gap 15 5 - 15    Comment: Performed at [facility], [address] [place], [place] [address]  CBC     Status: Abnormal   Collection Time: 07/15/23  1:08 PM  Result Value Ref Range   WBC 8.3 4.0 - 10.5 K/uL   RBC 3.95 3.87 - 5.11 MIL/uL   Hemoglobin 14.0 12.0 - 15.0 g/dL   HCT 47.8 29.5 - 62.1 %   MCV 103.0 (H) 80.0 - 100.0 fL   MCH 35.4 (H) 26.0 - 34.0 pg   MCHC 34.4 30.0 - 36.0 g/dL   RDW 30.8 65.7 - 84.6 %   Platelets 233 150 - 400 K/uL   nRBC 0.0 0.0 - 0.2 %    Comment: Performed at [facility], [address] [place], [place] [address]  Magnesium      Status: Abnormal   Collection Time: 07/15/23  1:08 PM  Result Value Ref Range   Magnesium  1.4 (L) 1.7 - 2.4 mg/dL    Comment: Performed at [facility], [address] [place], [place] [address]  Urinalysis, Routine w reflex microscopic -Urine, Clean Catch     Status: Abnormal   Collection Time: 07/15/23  2:40 PM  Result Value Ref Range   Color, Urine STRAW (A) YELLOW   APPearance CLEAR CLEAR   Specific Gravity, Urine 1.016 1.005 - 1.030    pH 6.0 5.0 - 8.0   Glucose, UA NEGATIVE NEGATIVE mg/dL   Hgb urine dipstick NEGATIVE NEGATIVE   Bilirubin Urine NEGATIVE NEGATIVE   Ketones, ur NEGATIVE NEGATIVE mg/dL   Protein, ur NEGATIVE NEGATIVE mg/dL   Nitrite NEGATIVE NEGATIVE   Leukocytes,Ua NEGATIVE NEGATIVE    Comment: Performed at [facility], [address] [place], [place] [address]   CT ABDOMEN PELVIS W CONTRAST Addendum Date: 07/15/2023 ADDENDUM REPORT: 07/15/2023 16:00 ADDENDUM:  These results were called by telephone at the time of interpretation on 07/15/2023 at 3:59 pm to provider Dr. Lind Repine, who verbally acknowledged these results. Electronically Signed   By: Reagan Camera M.D.   On: 07/15/2023 16:00   Result Date: 07/15/2023 CLINICAL DATA:  Left-sided upper abdominal pain, vomiting EXAM: CT ABDOMEN AND PELVIS WITH CONTRAST TECHNIQUE: Multidetector CT imaging of the abdomen and pelvis was performed using the standard protocol following bolus administration of intravenous contrast. RADIATION DOSE REDUCTION: This exam was performed according to the departmental dose-optimization program which includes automated exposure control, adjustment of the mA and/or kV according to patient size and/or use of iterative reconstruction technique. CONTRAST:  OMNIPAQUE  IOHEXOL  300 MG/ML  SOLN COMPARISON:  CT of the abdomen and pelvis performed September 17, 2021. Abdominal ultrasound performed Jul 02, 2023 FINDINGS: Lower chest: A moderate size hiatal hernia is present. The stomach is rotated in the gastric outlet is in the lower mediastinum. The second and third portions of the duodenum are in anatomic location. When compared to CT scan performed September 17, 2021, there has been an increase in the portion of the stomach which is in the lower chest as well as the degree of gastric rotation. There is enhancement of the gastric wall visualized throughout. The imaged portion of the distal esophagus is not dilated. Hepatobiliary:  Not significantly changed. Pancreas: Unchanged Spleen: Within normal limits. Adrenals/Urinary Tract: Motion related changes are present in both kidneys. Small left adrenal nodule, similar. A cyst is present in the lower pole left kidney which measures 1.3 cm. Stomach/Bowel: Hiatal hernia with rotated stomach is detailed above. The small bowel is nondilated. Colonic diverticular changes are present Vascular/Lymphatic: No significant vascular findings are present. No enlarged abdominal or pelvic lymph nodes. Reproductive: Status post hysterectomy. No adnexal masses. A cystic structure is present in the low pelvis which was seen on the previous exam. This measures approximately 6.0 by 4.3 cm, not significantly changed. Other: No abdominal wall hernia or abnormality. No abdominopelvic ascites. Musculoskeletal: Degenerative changes are present in the imaged osseous structures. Compression deformity of T12, similar when compared to the previous exam. IMPRESSION: 1. Abnormal appearance of the stomach with herniation into the lower chest and with imaging features of gastric rotation which can be observed in the setting of gastric volvulus. Based on imaging, organo-axial rotation is favored. There is diffuse symmetric enhancement of the stomach. 2. The previously observed cystic structure in the pelvis is not significantly changed. Outpatient follow-up ultrasound should be considered. 3. Diverticulosis without evidence of diverticulitis. Electronically Signed: By: Reagan Camera M.D. On: 07/15/2023 15:40      Assessment/Plan Gastric volvulus  - afebrile, WBC 8, hemodynamically stable, no peritonitis  - no emergent role for surgery. Recommend NG tube placement for decompression, and hold Eliquis . I have asked for a cardiology consult for perioperative risk stratification/optimization and they will see her tomorrow morning. She may benefit from surgery this admission, pending medical and cardiac evaluations and risk  stratification.   FEN - NPO, NGT tube LIWS, limited ice chips ok once NG is in VTE - SCD's, hold eliquis  ID - no abx needed Admit - TRH service for MMP and age   HTN HLD Diastolic heart failure - cardiology [place], MD afib Arthritis    I reviewed nursing notes, ED provider notes, hospitalist notes, last 24 h vitals and pain scores, last 48 h intake and output, last 24 h labs and trends, and last 24 h imaging results.  Michial Akin, [facility] 07/15/2023, 4:26 PM Please see Amion for pager number during day hours 7:00am-4:30pm or 7:00am -11:30am on weekends

## 2023-07-15 NOTE — H&P (Addendum)
 History and Physical  Linda Glass:[id number] DOB: 03-17-1933 DOA: 07/15/2023  PCP: [name], [name], MD   Chief Complaint: Nausea and vomiting  HPI: Linda Glass is a 89 y.o. female with medical history significant for hypertension, hyperlipidemia, hiatal hernia, atrial fibrillation on Eliquis  being admitted to the hospital with gastric volvulus.  She was recently admitted to the hospital earlier this month due to anxiety and tremors felt to be due to recently attempting to taper off her Celexa .  In any case, she states she has been doing well but had sudden onset of abdominal pain, nausea and vomiting last evening.  When it became persistent through the night, she was brought to the hospital from [facility] this morning.  Workup as detailed below shows evidence of gastric volvulus, NG tube has been placed and patient is without significant abdominal pain or nausea at the moment.  She has been seen by general surgery, who plans no urgent surgical intervention at this time.  Review of Systems: Please see HPI for pertinent positives and negatives. A complete 10 system review of systems are otherwise negative.  Past Medical History:  Diagnosis Date   Anemia 04/2020   Anxiety    Arthritis    Chicken pox    COVID-19    Heart murmur    Hyperlipidemia    Hypertension    Past Surgical History:  Procedure Laterality Date   ABDOMINAL HYSTERECTOMY  1983   BIOPSY  05/05/2020   Procedure: BIOPSY;  Surgeon: Sergio Dandy, MD;  Location: [facility];  Service: Endoscopy;;  EGD and COLON   COLONOSCOPY WITH PROPOFOL  N/A 05/05/2020   Procedure: COLONOSCOPY WITH PROPOFOL ;  Surgeon: Sergio Dandy, MD;  Location: [facility];  Service: Endoscopy;  Laterality: N/A;   ESOPHAGOGASTRODUODENOSCOPY (EGD) WITH PROPOFOL  N/A 05/05/2020   Procedure: ESOPHAGOGASTRODUODENOSCOPY (EGD) WITH PROPOFOL ;  Surgeon: Sergio Dandy, MD;  Location: [facility];  Service:  Endoscopy;  Laterality: N/A;   FRACTURE SURGERY     wrist fracture 2013   LAPAROSCOPIC PARTIAL COLECTOMY Right 03/12/2021   Procedure: LAPAROSCOPIC HAND ASSISTED RIGHT COLECTOMY;  Surgeon: Junie Olds, MD;  Location: [facility];  Service: General;  Laterality: Right;   Social History:  reports that she has never smoked. She has never used smokeless tobacco. She reports that she does not currently use alcohol after a past usage of about 7.0 standard drinks of alcohol per week. She reports that she does not use drugs.  Allergies  Allergen Reactions   Losartan  Other (See Comments)    diarrhea    Family History  Problem Relation Age of Onset   Hyperlipidemia Mother    Diabetes Neg Hx      Prior to Admission medications   Medication Sig Start Date End Date Taking? Authorizing Provider  acetaminophen  (TYLENOL ) 325 MG tablet Take 2 tablets (650 mg total) by mouth every 6 (six) hours as needed for mild pain (or Fever >/= 101). 09/21/21   Sheikh, Faith Latif, DO  amLODipine  (NORVASC ) 10 MG tablet TAKE ONE TABLET BY MOUTH DAILY 11/04/22   Maudine Sos, MD  apixaban  (ELIQUIS ) 5 MG TABS tablet TAKE ONE TABLET BY MOUTH TWICE DAILY 05/30/23   [name], [name], MD  atorvastatin  (LIPITOR) 40 MG tablet TAKE 1 TABLET EACH DAY. 05/12/23   [name], [name], MD  citalopram  (CELEXA ) 20 MG tablet Take 1 tablet (20 mg total) by mouth daily. 07/05/23   Rai, Ripudeep Linnell Richardson, MD  Cyanocobalamin  (VITAMIN B 12 PO) Take  500 mcg by mouth daily.    [provider]  Ferrous Gluconate  324 (37.5 Fe) MG TABS Take 324 mg by mouth every other day.    [provider]  folic acid  (FOLVITE ) 1 MG tablet Take 1 tablet (1 mg total) by mouth daily. 07/06/23   Rai, Hurman Maiden, MD  furosemide  (LASIX ) 80 MG tablet TAKE ONE TABLET BY MOUTH DAILY 10/21/22   Maudine Sos, MD  LORazepam  (ATIVAN ) 0.5 MG tablet Take 1 tablet (0.5 mg total) by mouth at bedtime. 07/05/23   Rai, Hurman Maiden, MD  magnesium  oxide (MAG-OX) 400  (241.3 Mg) MG tablet Take 0.5 tablets (200 mg total) by mouth daily. Patient taking differently: Take 400 mg by mouth daily. 05/12/20   [place], Rema Care, DO  metoprolol  succinate (TOPROL -XL) 25 MG 24 hr tablet Take 0.5 tablets (12.5 mg total) by mouth daily. 07/07/23   Rai, Hurman Maiden, MD  Multiple Vitamin (MULTIVITAMIN WITH MINERALS) TABS tablet Take 1 tablet by mouth daily.    [provider]  ondansetron  (ZOFRAN ) 4 MG tablet Take 1 tablet (4 mg total) by mouth every 8 (eight) hours as needed for nausea or vomiting. 07/05/23   Rai, Ripudeep K, MD  pantoprazole  (PROTONIX ) 40 MG tablet TAKE ONE TABLET BY MOUTH ONCE DAILY 12/16/22   [name], [name], MD  thiamine  (VITAMIN B-1) 100 MG tablet Take 1 tablet (100 mg total) by mouth daily. 07/06/23   Loma Rising, MD    Physical Exam: BP (!) 142/83   Pulse (!) 58   Temp 98 F (36.7 C) (Oral)   Resp 16   SpO2 92%  General:  Alert, hard of hearing, but well oriented, calm, in no acute distress.  NG tube in place draining dark green material Cardiovascular: RRR, no murmurs or rubs, no peripheral edema  Respiratory: clear to auscultation bilaterally, no wheezes, no crackles  Abdomen: soft, nontender, nondistended, normal bowel tones heard  Skin: dry, no rashes  Musculoskeletal: no joint effusions, normal range of motion           Labs on Admission:  Basic Metabolic Panel: Recent Labs  Lab 07/15/23 1308  NA 136  K 2.4*  CL 93*  CO2 28  GLUCOSE 129*  BUN 12  CREATININE 0.53  CALCIUM  9.7  MG 1.4*   Liver Function Tests: Recent Labs  Lab 07/15/23 1308  AST 32  ALT 23  ALKPHOS 120  BILITOT 1.0  PROT 7.5  ALBUMIN 3.8   Recent Labs  Lab 07/15/23 1308  LIPASE 47   No results for input(s): "AMMONIA" in the last 168 hours. CBC: Recent Labs  Lab 07/15/23 1308  WBC 8.3  HGB 14.0  HCT 40.7  MCV 103.0*  PLT 233   Cardiac Enzymes: No results for input(s): "CKTOTAL", "CKMB", "CKMBINDEX", "TROPONINI" in the last 168  hours. BNP (last 3 results) No results for input(s): "BNP" in the last 8760 hours.  ProBNP (last 3 results) No results for input(s): "PROBNP" in the last 8760 hours.  CBG: No results for input(s): "GLUCAP" in the last 168 hours.  Radiological Exams on Admission: CT ABDOMEN PELVIS W CONTRAST Addendum Date: 07/15/2023 ADDENDUM REPORT: 07/15/2023 16:00 ADDENDUM: These results were called by telephone at the time of interpretation on 07/15/2023 at 3:59 pm to provider Dr. Lind Repine, who verbally acknowledged these results. Electronically Signed   By: Reagan Camera M.D.   On: 07/15/2023 16:00   Result Date: 07/15/2023 CLINICAL DATA:  Left-sided upper abdominal pain, vomiting  EXAM: CT ABDOMEN AND PELVIS WITH CONTRAST TECHNIQUE: Multidetector CT imaging of the abdomen and pelvis was performed using the standard protocol following bolus administration of intravenous contrast. RADIATION DOSE REDUCTION: This exam was performed according to the departmental dose-optimization program which includes automated exposure control, adjustment of the mA and/or kV according to patient size and/or use of iterative reconstruction technique. CONTRAST:  100mL OMNIPAQUE  IOHEXOL  300 MG/ML  SOLN COMPARISON:  CT of the abdomen and pelvis performed September 17, 2021. Abdominal ultrasound performed Jul 02, 2023 FINDINGS: Lower chest: A moderate size hiatal hernia is present. The stomach is rotated in the gastric outlet is in the lower mediastinum. The second and third portions of the duodenum are in anatomic location. When compared to CT scan performed September 17, 2021, there has been an increase in the portion of the stomach which is in the lower chest as well as the degree of gastric rotation. There is enhancement of the gastric wall visualized throughout. The imaged portion of the distal esophagus is not dilated. Hepatobiliary: Not significantly changed. Pancreas: Unchanged Spleen: Within normal limits. Adrenals/Urinary Tract: Motion  related changes are present in both kidneys. Small left adrenal nodule, similar. A cyst is present in the lower pole left kidney which measures 1.3 cm. Stomach/Bowel: Hiatal hernia with rotated stomach is detailed above. The small bowel is nondilated. Colonic diverticular changes are present Vascular/Lymphatic: No significant vascular findings are present. No enlarged abdominal or pelvic lymph nodes. Reproductive: Status post hysterectomy. No adnexal masses. A cystic structure is present in the low pelvis which was seen on the previous exam. This measures approximately 6.0 by 4.3 cm, not significantly changed. Other: No abdominal wall hernia or abnormality. No abdominopelvic ascites. Musculoskeletal: Degenerative changes are present in the imaged osseous structures. Compression deformity of T12, similar when compared to the previous exam. IMPRESSION: 1. Abnormal appearance of the stomach with herniation into the lower chest and with imaging features of gastric rotation which can be observed in the setting of gastric volvulus. Based on imaging, organo-axial rotation is favored. There is diffuse symmetric enhancement of the stomach. 2. The previously observed cystic structure in the pelvis is not significantly changed. Outpatient follow-up ultrasound should be considered. 3. Diverticulosis without evidence of diverticulitis. Electronically Signed: By: Reagan Camera M.D. On: 07/15/2023 15:40   Assessment/Plan Linda Glass is a 89 y.o. female with medical history significant for hypertension, hyperlipidemia, hiatal hernia, atrial fibrillation on Eliquis  being admitted to the hospital with gastric volvulus.  Gastric volvulus-as seen on CT as above, has already been seen by general surgery and NG tube has been placed -Inpatient admission -N.p.o., with NG tube to low intermittent suction -Pain and nausea medication as needed -IV fluids -Surgical team has requested cardiology evaluation for  preoperative risk stratification  Atrial fibrillation-on Eliquis , with last dose taken this morning 5/20.  Due to possibility of surgical intervention, will discontinue Eliquis  at this time.  Per surgery team recommendation we will keep on SCDs only for the time being.  Hypokalemia and hypomagnesemia-due to GI losses, magnesium  and potassium are being replaced  Hypertension and hyperlipidemia-hold oral medications for now, IV hydralazine  as needed for SBP greater than 160  Anxiety-patient takes Celexa  20 mg p.o. daily, as well as 0.5 mg Ativan  p.o. nightly.  Will plan to resume her home oral medications soon as possible.  For the time being, we will treat her anxiety with small doses of Ativan  oral solution as needed.  DVT prophylaxis: SCDs    Code  Status: Limited: Do not attempt resuscitation (DNR) -DNR-LIMITED -Do Not Intubate/DNI , confirmed with the patient at the time of admission as well as during phone conversation with the patient's son-in-law Alvie Jolly.  Consults called: General Surgery  Admission status: The appropriate patient status for this patient is INPATIENT. Inpatient status is judged to be reasonable and necessary in order to provide the required intensity of service to ensure the patient's safety. The patient's presenting symptoms, physical exam findings, and initial radiographic and laboratory data in the context of their chronic comorbidities is felt to place them at high risk for further clinical deterioration. Furthermore, it is not anticipated that the patient will be medically stable for discharge from the hospital within 2 midnights of admission.    I certify that at the point of admission it is my clinical judgment that the patient will require inpatient hospital care spanning beyond 2 midnights from the point of admission due to high intensity of service, high risk for further deterioration and high frequency of surveillance required  Time spent: 56 minutes  Isabella Roemmich Rickey Charm MD [facility] Pager [phone]  If 7PM-7AM, please contact night-coverage [email or web address] Password [email or web address]  07/15/2023, 4:53 PM

## 2023-07-16 ENCOUNTER — Inpatient Hospital Stay (HOSPITAL_COMMUNITY)

## 2023-07-16 DIAGNOSIS — Z01818 Encounter for other preprocedural examination: Secondary | ICD-10-CM

## 2023-07-16 DIAGNOSIS — I4821 Permanent atrial fibrillation: Secondary | ICD-10-CM | POA: Diagnosis not present

## 2023-07-16 DIAGNOSIS — I341 Nonrheumatic mitral (valve) prolapse: Secondary | ICD-10-CM

## 2023-07-16 DIAGNOSIS — I5032 Chronic diastolic (congestive) heart failure: Secondary | ICD-10-CM | POA: Diagnosis not present

## 2023-07-16 DIAGNOSIS — E876 Hypokalemia: Secondary | ICD-10-CM

## 2023-07-16 DIAGNOSIS — K3189 Other diseases of stomach and duodenum: Secondary | ICD-10-CM | POA: Diagnosis not present

## 2023-07-16 LAB — CBC
HCT: 36.5 % (ref 36.0–46.0)
Hemoglobin: 12 g/dL (ref 12.0–15.0)
MCH: 35.6 pg — ABNORMAL HIGH (ref 26.0–34.0)
MCHC: 32.9 g/dL (ref 30.0–36.0)
MCV: 108.3 fL — ABNORMAL HIGH (ref 80.0–100.0)
Platelets: 166 10*3/uL (ref 150–400)
RBC: 3.37 MIL/uL — ABNORMAL LOW (ref 3.87–5.11)
RDW: 12.8 % (ref 11.5–15.5)
WBC: 6.3 10*3/uL (ref 4.0–10.5)
nRBC: 0 % (ref 0.0–0.2)

## 2023-07-16 LAB — ECHOCARDIOGRAM COMPLETE
AR max vel: 2.97 cm2
AV Area VTI: 2.59 cm2
AV Area mean vel: 2.79 cm2
AV Mean grad: 3 mmHg
AV Peak grad: 6.1 mmHg
Ao pk vel: 1.23 m/s
Area-P 1/2: 3.44 cm2
S' Lateral: 3.4 cm

## 2023-07-16 LAB — BASIC METABOLIC PANEL WITH GFR
Anion gap: 9 (ref 5–15)
BUN: 7 mg/dL — ABNORMAL LOW (ref 8–23)
CO2: 27 mmol/L (ref 22–32)
Calcium: 8.6 mg/dL — ABNORMAL LOW (ref 8.9–10.3)
Chloride: 101 mmol/L (ref 98–111)
Creatinine, Ser: 0.32 mg/dL — ABNORMAL LOW (ref 0.44–1.00)
GFR, Estimated: >60 mL/min (ref >60)
Glucose, Bld: 95 mg/dL (ref 70–99)
Potassium: 2.8 mmol/L — ABNORMAL LOW (ref 3.5–5.1)
Sodium: 137 mmol/L (ref 135–145)

## 2023-07-16 LAB — RETICULOCYTES
Immature Retic Fract: 13.4 % (ref 2.3–15.9)
RBC.: 3.37 MIL/uL — ABNORMAL LOW (ref 3.87–5.11)
Retic Count, Absolute: 37.7 10*3/uL (ref 19.0–186.0)
Retic Ct Pct: 1.1 % (ref 0.4–3.1)

## 2023-07-16 LAB — IRON AND TIBC
Iron: 77 ug/dL (ref 28–170)
Saturation Ratios: 24 % (ref 10.4–31.8)
TIBC: 321 ug/dL (ref 250–450)
UIBC: 244 ug/dL

## 2023-07-16 LAB — PREALBUMIN: Prealbumin: 21 mg/dL (ref 18–38)

## 2023-07-16 LAB — FERRITIN: Ferritin: 107 ng/mL (ref 11–307)

## 2023-07-16 LAB — PHOSPHORUS: Phosphorus: 3.1 mg/dL (ref 2.5–4.6)

## 2023-07-16 LAB — FOLATE: Folate: >40 ng/mL (ref >5.9)

## 2023-07-16 LAB — MAGNESIUM: Magnesium: 1.7 mg/dL (ref 1.7–2.4)

## 2023-07-16 LAB — VITAMIN B12: Vitamin B-12: 1408 pg/mL — ABNORMAL HIGH (ref 180–914)

## 2023-07-16 MED ORDER — MAGNESIUM SULFATE 2 GM/50ML IV SOLN
2.0000 g | Freq: Once | INTRAVENOUS | Status: AC
  Administered 2023-07-16: 2 g via INTRAVENOUS
  Filled 2023-07-16: qty 50

## 2023-07-16 MED ORDER — CHLORHEXIDINE GLUCONATE CLOTH 2 % EX PADS
6.0000 | MEDICATED_PAD | Freq: Once | CUTANEOUS | Status: AC
  Administered 2023-07-16: 6 via TOPICAL

## 2023-07-16 MED ORDER — HEPARIN (PORCINE) 25000 UT/250ML-% IV SOLN
850.0000 [IU]/h | INTRAVENOUS | Status: AC
  Administered 2023-07-16: 850 [IU]/h via INTRAVENOUS
  Filled 2023-07-16: qty 250

## 2023-07-16 MED ORDER — SODIUM CHLORIDE 0.9 % IV SOLN
INTRAVENOUS | Status: DC
Start: 2023-07-16 — End: 2023-07-17

## 2023-07-16 MED ORDER — HEPARIN BOLUS VIA INFUSION
3000.0000 [IU] | Freq: Once | INTRAVENOUS | Status: AC
  Administered 2023-07-16: 3000 [IU] via INTRAVENOUS
  Filled 2023-07-16: qty 3000

## 2023-07-16 MED ORDER — POTASSIUM CHLORIDE 10 MEQ/100ML IV SOLN
10.0000 meq | INTRAVENOUS | Status: AC
Start: 2023-07-16 — End: 2023-07-16
  Administered 2023-07-16 (×6): 10 meq via INTRAVENOUS
  Filled 2023-07-16 (×6): qty 100

## 2023-07-16 MED ORDER — CHLORHEXIDINE GLUCONATE CLOTH 2 % EX PADS
6.0000 | MEDICATED_PAD | Freq: Once | CUTANEOUS | Status: AC
  Administered 2023-07-17: 6 via TOPICAL

## 2023-07-16 MED ORDER — DEXAMETHASONE SODIUM PHOSPHATE 4 MG/ML IJ SOLN: 4.0000 mg | INTRAMUSCULAR | Status: DC

## 2023-07-16 MED ORDER — BUPIVACAINE LIPOSOME 1.3 % IJ SUSP
20.0000 mL | Freq: Once | INTRAMUSCULAR | Status: AC
  Administered 2023-07-17: 20 mL
  Administered 2023-07-17: 266 mg

## 2023-07-16 MED ORDER — SODIUM CHLORIDE 0.9 % IV SOLN
2.0000 g | INTRAVENOUS | Status: AC
  Administered 2023-07-17: 2 g via INTRAVENOUS
  Filled 2023-07-16: qty 20

## 2023-07-16 MED ORDER — LORAZEPAM 2 MG/ML PO CONC
0.5000 mg | Freq: Every evening | ORAL | Status: DC | PRN
Start: 2023-07-16 — End: 2023-07-21

## 2023-07-16 NOTE — Progress Notes (Signed)
 PHARMACY - ANTICOAGULATION CONSULT NOTE  Pharmacy Consult for heparin Indication: atrial fibrillation  Allergies  Allergen Reactions   Losartan  Other (See Comments)    diarrhea    Patient Measurements: Heparin dosing weight: 60 kg  Vital Signs: Temp: 98.4 F (36.9 C) (05/21 1024) Temp Source: Oral (05/21 0932) BP: 140/68 (05/21 1024) Pulse Rate: 62 (05/21 1024)  Labs: Recent Labs    07/15/23 1308 07/16/23 0511  HGB 14.0 12.0  HCT 40.7 36.5  PLT 233 166  CREATININE 0.53 0.32*    CrCl cannot be calculated (Unknown ideal weight.).   Medical History: Past Medical History:  Diagnosis Date   Anemia 04/2020   Anxiety    Arthritis    Chicken pox    COVID-19    Heart murmur    Hyperlipidemia    Hypertension     Medications:  Eliquis  5 mg BID - last dose 5/20 am  Assessment: 89 year old female with history of afib on Eliquis , last dose morning of 5/20. Patient presented with abdominal pain and vomiting. Found to have gastric volvulus, general surgery consulted. Plan for hiatal hernia repair 5/22, Eliquis  remains on hold. Pharmacy consulted to manage heparin infusion.  Goal of Therapy:  Heparin level 0.3-0.7 units/ml aPTT 66-102 seconds Monitor platelets by anticoagulation protocol: Yes   Plan:  -Heparin 3000 unit bolus (over 24 hours since last dose of Eliquis ) -Heparin infusion at 850 units/hr -Surgery planned for tomorrow morning. Heparin to stop 6 hours pre-op per discussion with General Surgery PA (procedure posted for 0715, stop time for heparin 0100) -By the time aPTT (8 hours after start of infusion) would be collected/resulted, heparin would be off or close to being turned off, so no role in checking level at this time -Will follow up plan for continued anticoagulation post op  Lolita Rise, PharmD, BCPS Clinical Pharmacist 07/16/2023 12:58 PM

## 2023-07-16 NOTE — Progress Notes (Signed)
   07/16/23 1225  TOC Brief Assessment  Insurance and Status Reviewed  Patient has primary care physician Yes  Home environment has been reviewed Home alone  Prior level of function: Modified independent  Prior/Current Home Services No current home services  Social Drivers of Health Review SDOH reviewed no interventions necessary  Readmission risk has been reviewed Yes  Transition of care needs no transition of care needs at this time

## 2023-07-16 NOTE — H&P (View-Only) (Signed)
 07/16/2023  Linda Glass [id number] 1933/03/19  CARE TEAM: PCP: [name], [name], MD  Outpatient Care Team: Patient Care Team: [name], [name], MD as PCP - General (Family Medicine) Sheryle Donning, MD as PCP - Cardiology (Cardiology) Alver Austin, [facility] (Inactive) as Pharmacist (Pharmacist) Maudine Sos, MD as Attending Physician (Cardiology) Sergio Dandy, MD as Consulting Physician (Gastroenterology)  Inpatient Treatment Team: Treatment Team:  Lonita Roach, MD Ccs, Md, MD Lbcardiology, Rounding, MD Lexine Redder, MD Janas Meadows, [place] Jari Merles, RN Delpha Fickle, [facility] Johnson, Femi Evander [place], RN   Problem List:   Principal Problem:   Mesenteroaxial gastric volvulus Active Problems:   Hyperlipidemia   Hypokalemia   Multiple gastric ulcers   Chronic diastolic heart failure (HCC)   Hypomagnesemia   Incarcerated hiatal hernia   Long term current use of anticoagulant therapy   Uses roller walker   Chronic atrial fibrillation (HCC)   Anxiety   History of COVID-19   Volvulus of stomach   * No surgery found *      Assessment [facility] Stay = 1 days)      Worsening hiatal hernia now incarcerated with mesentero-axial volvulus and obstruction.    Plan:  Continue nasogastric tube decompression.  Significant hypokalemia.  Continue to replete.  Double check magnesium  and phosphorus.  Awaiting cardiac clearance.  May need echocardiogram.  Will tentatively post for a minimally invasive robotic hiatal hernia reduction & repair.  Possible phasix/BioA mesh reinforcement given moderate size and her obesity.  Probable Toupet fundoplication.  G-tube seems unlikely.  Ideally tomorrow a.m. 5/22, 48 hours after holding Eliquis  anticoagulation, as long as medicine and cardiology teams do not feel patient is very high risk.  Patient wants to be aggressive if possible  The anatomy & physiology of the foregut  and anti-reflux mechanism was discussed.  The pathophysiology of hiatal herniation and GERD was discussed.  Natural history risks without surgery was discussed.   The patient's symptoms are not adequately controlled by medicines and other non-operative treatments.  I feel the risks of no intervention will lead to serious problems that outweigh the operative risks; therefore, I recommended surgery to reduce the hiatal hernia out of the chest and fundoplication to rebuild the anti-reflux valve and control reflux better.  Need for a thorough workup to rule out the differential diagnosis and plan treatment was explained.  I explained minimally invasive techniques with possible need for an open approach.  Risks such as bleeding, infection, abscess, leak,injury to other organs, need for repair of tissues / organs, need for further treatment, stroke, heart attack, death, and other risks were discussed.   I noted a good likelihood this will help address the problem.  Goals of post-operative recovery were discussed as well.  Possibility that this will not correct all symptoms was explained.  Post-operative dysphagia, need for short-term liquid & pureed diet, inability to vomit, possibility of reherniation, possible need for medicines to help control symptoms in addition to surgery were discussed.  We will work to minimize complications.   Educational handouts further explaining the pathology, treatment options, and dysphagia diet was given as well.  Questions were answered.  The patient expresses understanding & wishes to proceed with surgery.   -monitor electrolytes & replace as needed  Keep K>4, Mg>2, Phos>3  -VTE prophylaxis- SCDs.  Anticoagulation prophyllaxis SQ as appropriate  -mobilize as tolerated to help recovery.  Enlist therapies in moderate/high risk patients as appropriate  I updated the patient's status  to the patient and nurse  Recommendations were made.  Questions were answered.  They expressed  understanding & appreciation.  -Disposition: TBD.  Plan hiatal hernia repair 5/22 tomorrow a.m.     I reviewed nursing notes, Consultant cardiology notes, hospitalist notes, last 24 h vitals and pain scores, last 48 h intake and output, last 24 h labs and trends, and last 24 h imaging results.  I have reviewed this patient's available data, including medical history, events of note, test results, etc as part of my evaluation.   A significant portion of that time was spent in counseling. Care during the described time interval was provided by me.  This care required moderate level of medical decision making.  07/16/2023    Subjective: (Chief complaint)  NG tube in place.  Patient denies any nausea or vomiting.  No major events.  Wishes to be aggressive and proceed with surgical repair, especially since she got through her hand-assisted colectomy a couple years ago without many issues.  Son-in-law and family to come later today  Objective:  Vital signs:  Vitals:   07/15/23 1747 07/15/23 2127 07/16/23 0126 07/16/23 0546  BP: (!) 142/87 (!) 162/78 134/87 131/72  Pulse: (!) 55 63 64 65  Resp: 18 18 18 18   Temp: 97.7 F (36.5 C) (!) 97.4 F (36.3 C) 98 F (36.7 C) 98 F (36.7 C)  TempSrc: Oral Oral    SpO2: 99% 94% 94% 92%    Last BM Date : 07/12/23  Intake/Output   Yesterday:  05/20 0701 - 05/21 0700 In: 1625.2 [I.V.:1000; IV Piggyback:625.2] Out: 100 [Emesis/NG output:100] This shift:  No intake/output data recorded.  Bowel function:  Flatus: YES  BM:  No  NGT Drain: Bilious   Physical Exam:  General: Pt awake/alert in no acute distress Eyes: PERRL, normal EOM.  Sclera clear.  No icterus Neuro: CN II-XII intact w/o focal sensory/motor deficits. Lymph: No head/neck/groin lymphadenopathy Psych:  No delerium/psychosis/paranoia.  Oriented x 4 HENT: Normocephalic, Mucus membranes moist.  No thrush. HOH Neck: Supple, No tracheal deviation.  No obvious  thyromegaly Chest: No pain to chest wall compression.  Good respiratory excursion.  No audible wheezing CV:  Pulses intact.  Regular rhythm.  No major extremity edema MS: Normal AROM mjr joints.  No obvious deformity  Abdomen: Soft.  Nondistended.  Nontender.  No evidence of peritonitis.  No incarcerated hernias.  Ext:   No deformity.  No mjr edema.  No cyanosis Skin: No petechiae / purpurea.  No major sores.  Warm and dry    Results:   Cultures: Recent Results (from the past 720 hours)  C Difficile Quick Screen w PCR reflex     Status: None   Collection Time: 07/02/23  8:40 AM   Specimen: STOOL  Result Value Ref Range Status   C Diff antigen NEGATIVE NEGATIVE Final   C Diff toxin NEGATIVE NEGATIVE Final   C Diff interpretation No C. difficile detected.  Final    Comment: Performed at [facility], [address] [place], [place] [address]  Gastrointestinal Panel by PCR , Stool     Status: None   Collection Time: 07/02/23  8:40 AM   Specimen: Stool  Result Value Ref Range Status   Campylobacter species NOT DETECTED NOT DETECTED Final   Plesimonas shigelloides NOT DETECTED NOT DETECTED Final   Salmonella species NOT DETECTED NOT DETECTED Final   Yersinia enterocolitica NOT DETECTED NOT DETECTED Final   Vibrio species NOT DETECTED  NOT DETECTED Final   Vibrio cholerae NOT DETECTED NOT DETECTED Final   Enteroaggregative E coli (EAEC) NOT DETECTED NOT DETECTED Final   Enteropathogenic E coli (EPEC) NOT DETECTED NOT DETECTED Final   Enterotoxigenic E coli (ETEC) NOT DETECTED NOT DETECTED Final   Shiga like toxin producing E coli (STEC) NOT DETECTED NOT DETECTED Final   Shigella/Enteroinvasive E coli (EIEC) NOT DETECTED NOT DETECTED Final   Cryptosporidium NOT DETECTED NOT DETECTED Final   Cyclospora cayetanensis NOT DETECTED NOT DETECTED Final   Entamoeba histolytica NOT DETECTED NOT DETECTED Final   Giardia lamblia NOT DETECTED NOT DETECTED Final   Adenovirus  F40/41 NOT DETECTED NOT DETECTED Final   Astrovirus NOT DETECTED NOT DETECTED Final   Norovirus GI/GII NOT DETECTED NOT DETECTED Final   Rotavirus A NOT DETECTED NOT DETECTED Final   Sapovirus (I, II, IV, and V) NOT DETECTED NOT DETECTED Final    Comment: Performed at [facility], [address] [place], [place] [address]    Labs: Results for orders placed or performed during the hospital encounter of 07/15/23 (from the past 48 hours)  Lipase, blood     Status: None   Collection Time: 07/15/23  1:08 PM  Result Value Ref Range   Lipase 47 11 - 51 U/L    Comment: Performed at [facility], [address], [place], [place] [address]  Comprehensive metabolic panel     Status: Abnormal   Collection Time: 07/15/23  1:08 PM  Result Value Ref Range   Sodium 136 135 - 145 mmol/L   Potassium 2.4 (LL) 3.5 - 5.1 mmol/L    Comment: CRITICAL RESULT CALLED TO, READ BACK BY AND VERIFIED WITH LEWIS,M. RN AT 1401 07/15/23 MULLINS,T    Chloride 93 (L) 98 - 111 mmol/L   CO2 28 22 - 32 mmol/L   Glucose, Bld 129 (H) 70 - 99 mg/dL    Comment: Glucose reference range applies only to samples taken after fasting for at least 8 hours.   BUN 12 8 - 23 mg/dL   Creatinine, Ser 0.98 0.44 - 1.00 mg/dL   Calcium  9.7 8.9 - 10.3 mg/dL   Total Protein 7.5 6.5 - 8.1 g/dL   Albumin 3.8 3.5 - 5.0 g/dL   AST 32 15 - 41 U/L   ALT 23 0 - 44 U/L   Alkaline Phosphatase 120 38 - 126 U/L   Total Bilirubin 1.0 0.0 - 1.2 mg/dL   GFR, Estimated >11 >91 mL/min    Comment: (NOTE) Calculated using the CKD-EPI Creatinine Equation (2021)    Anion gap 15 5 - 15    Comment: Performed at [facility], [address] [place], [place] [address]  CBC     Status: Abnormal   Collection Time: 07/15/23  1:08 PM  Result Value Ref Range   WBC 8.3 4.0 - 10.5 K/uL   RBC 3.95 3.87 - 5.11 MIL/uL   Hemoglobin 14.0 12.0 - 15.0 g/dL   HCT 56.2 13.0 - 86.5 %   MCV 103.0 (H) 80.0 - 100.0 fL    MCH 35.4 (H) 26.0 - 34.0 pg   MCHC 34.4 30.0 - 36.0 g/dL   RDW 78.4 69.6 - 29.5 %   Platelets 233 150 - 400 K/uL   nRBC 0.0 0.0 - 0.2 %    Comment: Performed at [facility], [address] [place], [place] [address]  Magnesium      Status: Abnormal   Collection Time: 07/15/23  1:08 PM  Result Value Ref Range   Magnesium  1.4 (L) 1.7 - 2.4 mg/dL    Comment: Performed at [facility], [address], [place], [place] [address]  Urinalysis, Routine w reflex microscopic -Urine, Clean Catch     Status: Abnormal   Collection Time: 07/15/23  2:40 PM  Result Value Ref Range   Color, Urine STRAW (A) YELLOW   APPearance CLEAR CLEAR   Specific Gravity, Urine 1.016 1.005 - 1.030   pH 6.0 5.0 - 8.0   Glucose, UA NEGATIVE NEGATIVE mg/dL   Hgb urine dipstick NEGATIVE NEGATIVE   Bilirubin Urine NEGATIVE NEGATIVE   Ketones, ur NEGATIVE NEGATIVE mg/dL   Protein, ur NEGATIVE NEGATIVE mg/dL   Nitrite NEGATIVE NEGATIVE   Leukocytes,Ua NEGATIVE NEGATIVE    Comment: Performed at [facility], [address], [place], [place] [address]  CBC     Status: Abnormal   Collection Time: 07/16/23  5:11 AM  Result Value Ref Range   WBC 6.3 4.0 - 10.5 K/uL   RBC 3.37 (L) 3.87 - 5.11 MIL/uL   Hemoglobin 12.0 12.0 - 15.0 g/dL   HCT 29.5 62.1 - 30.8 %   MCV 108.3 (H) 80.0 - 100.0 fL   MCH 35.6 (H) 26.0 - 34.0 pg   MCHC 32.9 30.0 - 36.0 g/dL   RDW 65.7 84.6 - 96.2 %   Platelets 166 150 - 400 K/uL   nRBC 0.0 0.0 - 0.2 %    Comment: Performed at [facility], [address], [place], [place] [address]  Basic metabolic panel with GFR     Status: Abnormal   Collection Time: 07/16/23  5:11 AM  Result Value Ref Range   Sodium 137 135 - 145 mmol/L   Potassium 2.8 (L) 3.5 - 5.1 mmol/L   Chloride 101 98 - 111 mmol/L   CO2 27 22 - 32 mmol/L   Glucose, Bld 95 70 - 99 mg/dL    Comment: Glucose reference range applies only to samples taken  after fasting for at least 8 hours.   BUN 7 (L) 8 - 23 mg/dL   Creatinine, Ser 1.32 (L) 0.44 - 1.00 mg/dL   Calcium  8.6 (L) 8.9 - 10.3 mg/dL   GFR, Estimated >44 >01 mL/min    Comment: (NOTE) Calculated using the CKD-EPI Creatinine Equation (2021)    Anion gap 9 5 - 15    Comment: Performed at [facility], [address], [place], [place] [address]    Imaging / Studies: DG Abd Portable 1 View Result Date: 07/15/2023 CLINICAL DATA:  Nasogastric tube placement. EXAM: PORTABLE ABDOMEN - 1 VIEW COMPARISON:  None Available. FINDINGS: Nasogastric tube tip is seen in proximal stomach. Distal side hole is still in distal esophagus. IMPRESSION: Nasogastric tube tip is seen in proximal stomach with distal side hole in esophagus; advancement is recommended. Electronically Signed   By: Rosalene Colon M.D.   On: 07/15/2023 17:44   DG Chest Port 1 View Result Date: 07/15/2023 CLINICAL DATA:  Cough. EXAM: PORTABLE CHEST 1 VIEW COMPARISON:  October 02, 2022. FINDINGS: Stable cardiomegaly. Nasogastric tube tip seen in proximal stomach. Large hiatal hernia is noted. Minimal left basilar subsegmental atelectasis. Right lung is clear. IMPRESSION: Large hiatal hernia.  Minimal left basilar subsegmental atelectasis. Electronically Signed   By: Rosalene Colon M.D.   On: 07/15/2023 17:43   CT ABDOMEN PELVIS W CONTRAST Addendum Date: 07/15/2023 ADDENDUM REPORT: 07/15/2023 16:00 ADDENDUM: These  results were called by telephone at the time of interpretation on 07/15/2023 at 3:59 pm to provider Dr. Lind Repine, who verbally acknowledged these results. Electronically Signed   By: Reagan Camera M.D.   On: 07/15/2023 16:00   Result Date: 07/15/2023 CLINICAL DATA:  Left-sided upper abdominal pain, vomiting EXAM: CT ABDOMEN AND PELVIS WITH CONTRAST TECHNIQUE: Multidetector CT imaging of the abdomen and pelvis was performed using the standard protocol following bolus administration of intravenous contrast.  RADIATION DOSE REDUCTION: This exam was performed according to the departmental dose-optimization program which includes automated exposure control, adjustment of the mA and/or kV according to patient size and/or use of iterative reconstruction technique. CONTRAST:  OMNIPAQUE  IOHEXOL  300 MG/ML  SOLN COMPARISON:  CT of the abdomen and pelvis performed September 17, 2021. Abdominal ultrasound performed Jul 02, 2023 FINDINGS: Lower chest: A moderate size hiatal hernia is present. The stomach is rotated in the gastric outlet is in the lower mediastinum. The second and third portions of the duodenum are in anatomic location. When compared to CT scan performed September 17, 2021, there has been an increase in the portion of the stomach which is in the lower chest as well as the degree of gastric rotation. There is enhancement of the gastric wall visualized throughout. The imaged portion of the distal esophagus is not dilated. Hepatobiliary: Not significantly changed. Pancreas: Unchanged Spleen: Within normal limits. Adrenals/Urinary Tract: Motion related changes are present in both kidneys. Small left adrenal nodule, similar. A cyst is present in the lower pole left kidney which measures 1.3 cm. Stomach/Bowel: Hiatal hernia with rotated stomach is detailed above. The small bowel is nondilated. Colonic diverticular changes are present Vascular/Lymphatic: No significant vascular findings are present. No enlarged abdominal or pelvic lymph nodes. Reproductive: Status post hysterectomy. No adnexal masses. A cystic structure is present in the low pelvis which was seen on the previous exam. This measures approximately 6.0 by 4.3 cm, not significantly changed. Other: No abdominal wall hernia or abnormality. No abdominopelvic ascites. Musculoskeletal: Degenerative changes are present in the imaged osseous structures. Compression deformity of T12, similar when compared to the previous exam. IMPRESSION: 1. Abnormal appearance of the  stomach with herniation into the lower chest and with imaging features of gastric rotation which can be observed in the setting of gastric volvulus. Based on imaging, organo-axial rotation is favored. There is diffuse symmetric enhancement of the stomach. 2. The previously observed cystic structure in the pelvis is not significantly changed. Outpatient follow-up ultrasound should be considered. 3. Diverticulosis without evidence of diverticulitis. Electronically Signed: By: Reagan Camera M.D. On: 07/15/2023 15:40    Medications / Allergies: per chart  Antibiotics: Anti-infectives (From admission, onward)    None         Note: Portions of this report may have been transcribed using voice recognition software. Every effort was made to ensure accuracy; however, inadvertent computerized transcription errors may be present.   Any transcriptional errors that result from this process are unintentional.    Eddye Goodie, MD, FACS, MASCRS Esophageal, Gastrointestinal & Colorectal Surgery Robotic and Minimally Invasive Surgery  [facility] [address], [address] [place], [place] [address] [phone] Fax [phone] Main  CONTACT INFORMATION: Weekday (9AM-5PM): Call [facility] main office at [phone] Weeknight (5PM-9AM) or Weekend/Holiday: Check EPIC "Web Links" tab & use "AMION" (password " [email or web address]") for General Surgery [facility] coverage  Please, DO NOT use SecureChat  (it is not reliable communication to  reach operating surgeons & will lead to a delay in care).   Epic staff messaging available for outptient concerns needing 1-2 business day response.      07/16/2023  8:21 AM

## 2023-07-16 NOTE — Progress Notes (Signed)
 Mobility Specialist - Progress Note   07/16/23 0859  Mobility  Activity Ambulated with assistance in hallway  Level of Assistance Modified independent, requires aide device or extra time  Assistive Device Front wheel walker  Distance Ambulated (ft) 500 ft  Activity Response Tolerated well  Mobility Referral Yes  Mobility visit 1 Mobility  Mobility Specialist Start Time (ACUTE ONLY) 0847  Mobility Specialist Stop Time (ACUTE ONLY) 0858  Mobility Specialist Time Calculation (min) (ACUTE ONLY) 11 min   Pt received in bed and agreeable to mobility. No complaints during session. Upon returning to room, pt requested assistance to [facility]. Unable to void. Pt to recliner after session with all needs met.     [facility]

## 2023-07-16 NOTE — Progress Notes (Signed)
  Progress Note   Patient: Linda Glass VWU:[id number] DOB: 27-Jan-1934 DOA: 07/15/2023     1 DOS: the patient was seen and examined on 07/16/2023   Brief hospital course: 89 year old woman PMH including atrial fibrillation on apixaban  presented with abdominal pain nausea and vomiting.  Imaging showed gastric volvulus.  NG tube placed, seen by general surgery with plans for operative intervention.  Seen by cardiology for cardiac clearance.  Consultants General surgery Cardiology   Procedures/Events    Assessment and Plan: Gastric volvulus Seen on CT.  NG tube placed.  General surgery plans operative intervention likely tomorrow. Continue NG tube, IV fluids, antiemetics and pain control as needed. Seen by cardiology with recommendation for echocardiogram.   Permanent atrial fibrillation Holding apixaban  for surgery Telemetry   Hypokalemia  Hypomagnesemia Replete   Hypertension  Hyperlipidemia Monitor   Anxiety Resume Celexa  when able Ask Ativan  at night  Pelvic cystic structure Unchanged on CT.  Consider outpatient ultrasound follow-up.     Subjective:  Feels okay now.  No pain currently.  Physical Exam: Vitals:   07/16/23 0126 07/16/23 0546 07/16/23 0932 07/16/23 1024  BP: 134/87 131/72 132/82 (!) 140/68  Pulse: 64 65 75 62  Resp: 18 18 18 16   Temp: 98 F (36.7 C) 98 F (36.7 C) 98.3 F (36.8 C) 98.4 F (36.9 C)  TempSrc:   Oral   SpO2: 94% 92% 95% 94%   Physical Exam Vitals reviewed.  Constitutional:      General: She is not in acute distress.    Appearance: She is not ill-appearing or toxic-appearing.  Cardiovascular:     Rate and Rhythm: Normal rate and regular rhythm.     Heart sounds: No murmur heard. Pulmonary:     Effort: Pulmonary effort is normal. No respiratory distress.     Breath sounds: No wheezing, rhonchi or rales.  Neurological:     Mental Status: She is alert.  Psychiatric:        Mood and Affect: Mood normal.         Behavior: Behavior normal.     Data Reviewed: CBG stable Potassium 2.8 Magnesium  1.7, phosphorus 3.1 Hemoglobin stable 12.0 CT noted  Family Communication: none  Disposition: Status is: Inpatient Remains inpatient appropriate because: gastric volvulus      Time spent: 35 minutes  Author: Jerline Moon, MD 07/16/2023 3:29 PM  For on call review [email or web address].

## 2023-07-16 NOTE — Hospital Course (Addendum)
 89 year old woman PMH including atrial fibrillation on apixaban  presented with abdominal pain nausea and vomiting.  Imaging showed gastric volvulus.  NG tube placed, seen by general surgery with plans for operative intervention.  Seen by cardiology for cardiac clearance.  Consultants General surgery Cardiology   Procedures/Events 5/22 reduction paraesophageal hiatal hernia

## 2023-07-16 NOTE — Progress Notes (Addendum)
 07/16/2023  Linda Glass [id number] 1933/03/19  CARE TEAM: PCP: [name], [name], MD  Outpatient Care Team: Patient Care Team: [name], [name], MD as PCP - General (Family Medicine) Sheryle Donning, MD as PCP - Cardiology (Cardiology) Alver Austin, [facility] (Inactive) as Pharmacist (Pharmacist) Maudine Sos, MD as Attending Physician (Cardiology) Sergio Dandy, MD as Consulting Physician (Gastroenterology)  Inpatient Treatment Team: Treatment Team:  Lonita Roach, MD Ccs, Md, MD [facility], Rounding, MD Lexine Redder, MD Janas Meadows, [place] Jari Merles, RN Delpha Fickle, [facility] Johnson, Femi Evander [place], RN   Problem List:   Principal Problem:   Mesenteroaxial gastric volvulus Active Problems:   Hyperlipidemia   Hypokalemia   Multiple gastric ulcers   Chronic diastolic heart failure (HCC)   Hypomagnesemia   Incarcerated hiatal hernia   Long term current use of anticoagulant therapy   Uses roller walker   Chronic atrial fibrillation (HCC)   Anxiety   History of COVID-19   Volvulus of stomach   * No surgery found *      Assessment [facility] Stay = 1 days)      Worsening hiatal hernia now incarcerated with mesentero-axial volvulus and obstruction.    Plan:  Continue nasogastric tube decompression.  Significant hypokalemia.  Continue to replete.  Double check magnesium  and phosphorus.  Awaiting cardiac clearance.  May need echocardiogram.  Will tentatively post for a minimally invasive robotic hiatal hernia reduction & repair.  Possible phasix/BioA mesh reinforcement given moderate size and her obesity.  Probable Toupet fundoplication.  G-tube seems unlikely.  Ideally tomorrow a.m. 5/22, 48 hours after holding Eliquis  anticoagulation, as long as medicine and cardiology teams do not feel patient is very high risk.  Patient wants to be aggressive if possible  The anatomy & physiology of the foregut  and anti-reflux mechanism was discussed.  The pathophysiology of hiatal herniation and GERD was discussed.  Natural history risks without surgery was discussed.   The patient's symptoms are not adequately controlled by medicines and other non-operative treatments.  I feel the risks of no intervention will lead to serious problems that outweigh the operative risks; therefore, I recommended surgery to reduce the hiatal hernia out of the chest and fundoplication to rebuild the anti-reflux valve and control reflux better.  Need for a thorough workup to rule out the differential diagnosis and plan treatment was explained.  I explained minimally invasive techniques with possible need for an open approach.  Risks such as bleeding, infection, abscess, leak,injury to other organs, need for repair of tissues / organs, need for further treatment, stroke, heart attack, death, and other risks were discussed.   I noted a good likelihood this will help address the problem.  Goals of post-operative recovery were discussed as well.  Possibility that this will not correct all symptoms was explained.  Post-operative dysphagia, need for short-term liquid & pureed diet, inability to vomit, possibility of reherniation, possible need for medicines to help control symptoms in addition to surgery were discussed.  We will work to minimize complications.   Educational handouts further explaining the pathology, treatment options, and dysphagia diet was given as well.  Questions were answered.  The patient expresses understanding & wishes to proceed with surgery.   -monitor electrolytes & replace as needed  Keep K>4, Mg>2, Phos>3  -VTE prophylaxis- SCDs.  Anticoagulation prophyllaxis SQ as appropriate  -mobilize as tolerated to help recovery.  Enlist therapies in moderate/high risk patients as appropriate  I updated the patient's status  to the patient and nurse  Recommendations were made.  Questions were answered.  They expressed  understanding & appreciation.  -Disposition: TBD.  Plan hiatal hernia repair 5/22 tomorrow a.m.     I reviewed nursing notes, Consultant cardiology notes, hospitalist notes, last 24 h vitals and pain scores, last 48 h intake and output, last 24 h labs and trends, and last 24 h imaging results.  I have reviewed this patient's available data, including medical history, events of note, test results, etc as part of my evaluation.   A significant portion of that time was spent in counseling. Care during the described time interval was provided by me.  This care required moderate level of medical decision making.  07/16/2023    Subjective: (Chief complaint)  NG tube in place.  Patient denies any nausea or vomiting.  No major events.  Wishes to be aggressive and proceed with surgical repair, especially since she got through her hand-assisted colectomy a couple years ago without many issues.  Son-in-law and family to come later today  Objective:  Vital signs:  Vitals:   07/15/23 1747 07/15/23 2127 07/16/23 0126 07/16/23 0546  BP: (!) 142/87 (!) 162/78 134/87 131/72  Pulse: (!) 55 63 64 65  Resp: 18 18 18 18   Temp: 97.7 F (36.5 C) (!) 97.4 F (36.3 C) 98 F (36.7 C) 98 F (36.7 C)  TempSrc: Oral Oral    SpO2: 99% 94% 94% 92%    Last BM Date : 07/12/23  Intake/Output   Yesterday:  05/20 0701 - 05/21 0700 In: 1625.2 [I.V.:1000; IV Piggyback:625.2] Out: 100 [Emesis/NG output:100] This shift:  No intake/output data recorded.  Bowel function:  Flatus: YES  BM:  No  NGT Drain: Bilious   Physical Exam:  General: Pt awake/alert in no acute distress Eyes: PERRL, normal EOM.  Sclera clear.  No icterus Neuro: CN II-XII intact w/o focal sensory/motor deficits. Lymph: No head/neck/groin lymphadenopathy Psych:  No delerium/psychosis/paranoia.  Oriented x 4 HENT: Normocephalic, Mucus membranes moist.  No thrush. HOH Neck: Supple, No tracheal deviation.  No obvious  thyromegaly Chest: No pain to chest wall compression.  Good respiratory excursion.  No audible wheezing CV:  Pulses intact.  Regular rhythm.  No major extremity edema MS: Normal AROM mjr joints.  No obvious deformity  Abdomen: Soft.  Nondistended.  Nontender.  No evidence of peritonitis.  No incarcerated hernias.  Ext:   No deformity.  No mjr edema.  No cyanosis Skin: No petechiae / purpurea.  No major sores.  Warm and dry    Results:   Cultures: Recent Results (from the past 720 hours)  C Difficile Quick Screen w PCR reflex     Status: None   Collection Time: 07/02/23  8:40 AM   Specimen: STOOL  Result Value Ref Range Status   C Diff antigen NEGATIVE NEGATIVE Final   C Diff toxin NEGATIVE NEGATIVE Final   C Diff interpretation No C. difficile detected.  Final    Comment: Performed at [facility], [address] [place], [place] [address]  Gastrointestinal Panel by PCR , Stool     Status: None   Collection Time: 07/02/23  8:40 AM   Specimen: Stool  Result Value Ref Range Status   Campylobacter species NOT DETECTED NOT DETECTED Final   Plesimonas shigelloides NOT DETECTED NOT DETECTED Final   Salmonella species NOT DETECTED NOT DETECTED Final   Yersinia enterocolitica NOT DETECTED NOT DETECTED Final   Vibrio species NOT DETECTED  NOT DETECTED Final   Vibrio cholerae NOT DETECTED NOT DETECTED Final   Enteroaggregative E coli (EAEC) NOT DETECTED NOT DETECTED Final   Enteropathogenic E coli (EPEC) NOT DETECTED NOT DETECTED Final   Enterotoxigenic E coli (ETEC) NOT DETECTED NOT DETECTED Final   Shiga like toxin producing E coli (STEC) NOT DETECTED NOT DETECTED Final   Shigella/Enteroinvasive E coli (EIEC) NOT DETECTED NOT DETECTED Final   Cryptosporidium NOT DETECTED NOT DETECTED Final   Cyclospora cayetanensis NOT DETECTED NOT DETECTED Final   Entamoeba histolytica NOT DETECTED NOT DETECTED Final   Giardia lamblia NOT DETECTED NOT DETECTED Final   Adenovirus  F40/41 NOT DETECTED NOT DETECTED Final   Astrovirus NOT DETECTED NOT DETECTED Final   Norovirus GI/GII NOT DETECTED NOT DETECTED Final   Rotavirus A NOT DETECTED NOT DETECTED Final   Sapovirus (I, II, IV, and V) NOT DETECTED NOT DETECTED Final    Comment: Performed at [facility], [address] [place], [place] [address]    Labs: Results for orders placed or performed during the hospital encounter of 07/15/23 (from the past 48 hours)  Lipase, blood     Status: None   Collection Time: 07/15/23  1:08 PM  Result Value Ref Range   Lipase 47 11 - 51 U/L    Comment: Performed at [facility], [address], [place], [place] [address]  Comprehensive metabolic panel     Status: Abnormal   Collection Time: 07/15/23  1:08 PM  Result Value Ref Range   Sodium 136 135 - 145 mmol/L   Potassium 2.4 (LL) 3.5 - 5.1 mmol/L    Comment: CRITICAL RESULT CALLED TO, READ BACK BY AND VERIFIED WITH LEWIS,M. RN AT 1401 07/15/23 MULLINS,T    Chloride 93 (L) 98 - 111 mmol/L   CO2 28 22 - 32 mmol/L   Glucose, Bld 129 (H) 70 - 99 mg/dL    Comment: Glucose reference range applies only to samples taken after fasting for at least 8 hours.   BUN 12 8 - 23 mg/dL   Creatinine, Ser 0.98 0.44 - 1.00 mg/dL   Calcium  9.7 8.9 - 10.3 mg/dL   Total Protein 7.5 6.5 - 8.1 g/dL   Albumin 3.8 3.5 - 5.0 g/dL   AST 32 15 - 41 U/L   ALT 23 0 - 44 U/L   Alkaline Phosphatase 120 38 - 126 U/L   Total Bilirubin 1.0 0.0 - 1.2 mg/dL   GFR, Estimated >11 >91 mL/min    Comment: (NOTE) Calculated using the CKD-EPI Creatinine Equation (2021)    Anion gap 15 5 - 15    Comment: Performed at [facility], [address] [place], [place] [address]  CBC     Status: Abnormal   Collection Time: 07/15/23  1:08 PM  Result Value Ref Range   WBC 8.3 4.0 - 10.5 K/uL   RBC 3.95 3.87 - 5.11 MIL/uL   Hemoglobin 14.0 12.0 - 15.0 g/dL   HCT 56.2 13.0 - 86.5 %   MCV 103.0 (H) 80.0 - 100.0 fL    MCH 35.4 (H) 26.0 - 34.0 pg   MCHC 34.4 30.0 - 36.0 g/dL   RDW 78.4 69.6 - 29.5 %   Platelets 233 150 - 400 K/uL   nRBC 0.0 0.0 - 0.2 %    Comment: Performed at [facility], [address] [place], [place] [address]  Magnesium      Status: Abnormal   Collection Time: 07/15/23  1:08 PM  Result Value Ref Range   Magnesium  1.4 (L) 1.7 - 2.4 mg/dL    Comment: Performed at [facility], [address], [place], [place] [address]  Urinalysis, Routine w reflex microscopic -Urine, Clean Catch     Status: Abnormal   Collection Time: 07/15/23  2:40 PM  Result Value Ref Range   Color, Urine STRAW (A) YELLOW   APPearance CLEAR CLEAR   Specific Gravity, Urine 1.016 1.005 - 1.030   pH 6.0 5.0 - 8.0   Glucose, UA NEGATIVE NEGATIVE mg/dL   Hgb urine dipstick NEGATIVE NEGATIVE   Bilirubin Urine NEGATIVE NEGATIVE   Ketones, ur NEGATIVE NEGATIVE mg/dL   Protein, ur NEGATIVE NEGATIVE mg/dL   Nitrite NEGATIVE NEGATIVE   Leukocytes,Ua NEGATIVE NEGATIVE    Comment: Performed at [facility], [address], [place], [place] [address]  CBC     Status: Abnormal   Collection Time: 07/16/23  5:11 AM  Result Value Ref Range   WBC 6.3 4.0 - 10.5 K/uL   RBC 3.37 (L) 3.87 - 5.11 MIL/uL   Hemoglobin 12.0 12.0 - 15.0 g/dL   HCT 29.5 62.1 - 30.8 %   MCV 108.3 (H) 80.0 - 100.0 fL   MCH 35.6 (H) 26.0 - 34.0 pg   MCHC 32.9 30.0 - 36.0 g/dL   RDW 65.7 84.6 - 96.2 %   Platelets 166 150 - 400 K/uL   nRBC 0.0 0.0 - 0.2 %    Comment: Performed at [facility], [address], [place], [place] [address]  Basic metabolic panel with GFR     Status: Abnormal   Collection Time: 07/16/23  5:11 AM  Result Value Ref Range   Sodium 137 135 - 145 mmol/L   Potassium 2.8 (L) 3.5 - 5.1 mmol/L   Chloride 101 98 - 111 mmol/L   CO2 27 22 - 32 mmol/L   Glucose, Bld 95 70 - 99 mg/dL    Comment: Glucose reference range applies only to samples taken  after fasting for at least 8 hours.   BUN 7 (L) 8 - 23 mg/dL   Creatinine, Ser 1.32 (L) 0.44 - 1.00 mg/dL   Calcium  8.6 (L) 8.9 - 10.3 mg/dL   GFR, Estimated >44 >01 mL/min    Comment: (NOTE) Calculated using the CKD-EPI Creatinine Equation (2021)    Anion gap 9 5 - 15    Comment: Performed at [facility], [address], [place], [place] [address]    Imaging / Studies: DG Abd Portable 1 View Result Date: 07/15/2023 CLINICAL DATA:  Nasogastric tube placement. EXAM: PORTABLE ABDOMEN - 1 VIEW COMPARISON:  None Available. FINDINGS: Nasogastric tube tip is seen in proximal stomach. Distal side hole is still in distal esophagus. IMPRESSION: Nasogastric tube tip is seen in proximal stomach with distal side hole in esophagus; advancement is recommended. Electronically Signed   By: Rosalene Colon M.D.   On: 07/15/2023 17:44   DG Chest Port 1 View Result Date: 07/15/2023 CLINICAL DATA:  Cough. EXAM: PORTABLE CHEST 1 VIEW COMPARISON:  October 02, 2022. FINDINGS: Stable cardiomegaly. Nasogastric tube tip seen in proximal stomach. Large hiatal hernia is noted. Minimal left basilar subsegmental atelectasis. Right lung is clear. IMPRESSION: Large hiatal hernia.  Minimal left basilar subsegmental atelectasis. Electronically Signed   By: Rosalene Colon M.D.   On: 07/15/2023 17:43   CT ABDOMEN PELVIS W CONTRAST Addendum Date: 07/15/2023 ADDENDUM REPORT: 07/15/2023 16:00 ADDENDUM: These  results were called by telephone at the time of interpretation on 07/15/2023 at 3:59 pm to provider Dr. Lind Repine, who verbally acknowledged these results. Electronically Signed   By: Reagan Camera M.D.   On: 07/15/2023 16:00   Result Date: 07/15/2023 CLINICAL DATA:  Left-sided upper abdominal pain, vomiting EXAM: CT ABDOMEN AND PELVIS WITH CONTRAST TECHNIQUE: Multidetector CT imaging of the abdomen and pelvis was performed using the standard protocol following bolus administration of intravenous contrast.  RADIATION DOSE REDUCTION: This exam was performed according to the departmental dose-optimization program which includes automated exposure control, adjustment of the mA and/or kV according to patient size and/or use of iterative reconstruction technique. CONTRAST:  OMNIPAQUE  IOHEXOL  300 MG/ML  SOLN COMPARISON:  CT of the abdomen and pelvis performed September 17, 2021. Abdominal ultrasound performed Jul 02, 2023 FINDINGS: Lower chest: A moderate size hiatal hernia is present. The stomach is rotated in the gastric outlet is in the lower mediastinum. The second and third portions of the duodenum are in anatomic location. When compared to CT scan performed September 17, 2021, there has been an increase in the portion of the stomach which is in the lower chest as well as the degree of gastric rotation. There is enhancement of the gastric wall visualized throughout. The imaged portion of the distal esophagus is not dilated. Hepatobiliary: Not significantly changed. Pancreas: Unchanged Spleen: Within normal limits. Adrenals/Urinary Tract: Motion related changes are present in both kidneys. Small left adrenal nodule, similar. A cyst is present in the lower pole left kidney which measures 1.3 cm. Stomach/Bowel: Hiatal hernia with rotated stomach is detailed above. The small bowel is nondilated. Colonic diverticular changes are present Vascular/Lymphatic: No significant vascular findings are present. No enlarged abdominal or pelvic lymph nodes. Reproductive: Status post hysterectomy. No adnexal masses. A cystic structure is present in the low pelvis which was seen on the previous exam. This measures approximately 6.0 by 4.3 cm, not significantly changed. Other: No abdominal wall hernia or abnormality. No abdominopelvic ascites. Musculoskeletal: Degenerative changes are present in the imaged osseous structures. Compression deformity of T12, similar when compared to the previous exam. IMPRESSION: 1. Abnormal appearance of the  stomach with herniation into the lower chest and with imaging features of gastric rotation which can be observed in the setting of gastric volvulus. Based on imaging, organo-axial rotation is favored. There is diffuse symmetric enhancement of the stomach. 2. The previously observed cystic structure in the pelvis is not significantly changed. Outpatient follow-up ultrasound should be considered. 3. Diverticulosis without evidence of diverticulitis. Electronically Signed: By: Reagan Camera M.D. On: 07/15/2023 15:40    Medications / Allergies: per chart  Antibiotics: Anti-infectives (From admission, onward)    None         Note: Portions of this report may have been transcribed using voice recognition software. Every effort was made to ensure accuracy; however, inadvertent computerized transcription errors may be present.   Any transcriptional errors that result from this process are unintentional.    Eddye Goodie, MD, FACS, MASCRS Esophageal, Gastrointestinal & Colorectal Surgery Robotic and Minimally Invasive Surgery  [facility] [address], [address] [place], [place] [address] [phone] Fax [phone] Main  CONTACT INFORMATION: Weekday (9AM-5PM): Call [facility] main office at [phone] Weeknight (5PM-9AM) or Weekend/Holiday: Check EPIC "Web Links" tab & use "AMION" (password " [email or web address]") for General Surgery [facility] coverage  Please, DO NOT use SecureChat  (it is not reliable communication to  reach operating surgeons & will lead to a delay in care).   Epic staff messaging available for outptient concerns needing 1-2 business day response.      07/16/2023  8:21 AM

## 2023-07-16 NOTE — Consult Note (Addendum)
 Cardiology Consultation   Patient ID: Linda Glass MRN: [id number]; DOB: [birth year]  Admit date: 07/15/2023 Date of Consult: 07/16/2023  PCP:  [name], [name], MD   [facility] Providers Cardiologist:  Sheryle Donning, MD   {  Patient Profile:   Linda Glass is a 89 y.o. female with a hx of permanent atrial fibrillation, mitral valve regurgitation/prolapse hypertension, hyperlipidemia, chronic HFpEF who is being seen 07/16/2023 for the evaluation of preoperative evaluation at the request of surgery.  History of Present Illness:   Linda Glass has previous hospital admission in March 2022 when she had severe GI bleed secondary to gastric ulcers and tubular adenoma.  No evidence of malignancy on biopsy.  She had new onset atrial fibrillation during that time but has since been deemed permanent.  Additionally lastly year 09/2022 she was admitted for concerns of syncope however this was in the setting of UTI with no further episodes.  Heart monitor was ordered however I do not see this was ever completed.  Echocardiogram showed preserved biventricular function with mild to moderate mitral regurgitation with prolapse.  Currently patient being evaluated for severe nausea and vomiting secondary to gastric volvulus with severe electrolyte derangements.  Potassium is low was 2.4 and magnesium  1.4 on arrival, being repleted however still low.  Potassium 2.8 today.  Cardiology asked to see to preoperatively evaluate.  Eliquis  has been held in possible anticipation of going to the OR.  Patient is originally from [place] but has lived in [place]  for over 60 years, used to live alone since her husband had passed away in 08/18/2009.  Now she lives with her daughter who is teaching here now.  She reports from a cardiac perspective she does very well, ambulates with a walker but able to go throughout the grocery store without any symptoms of chest pain, shortness  of breath.  Reports being very active.  She does not notice her atrial fibrillation, has no palpitations, dizziness, recent falls.  She has had no recurrence of episodes of syncope since her admission last year.  Chest x-ray with large hiatal hernia, no acute pathology.  Potassium today 2.8.  Creatinine 0.3.  Hemoglobin 12.  UA negative.   Past Medical History:  Diagnosis Date   Anemia 04/2020   Anxiety    Arthritis    Chicken pox    COVID-19    Heart murmur    Hyperlipidemia    Hypertension     Past Surgical History:  Procedure Laterality Date   ABDOMINAL HYSTERECTOMY  1983   BIOPSY  05/05/2020   Procedure: BIOPSY;  Surgeon: Sergio Dandy, MD;  Location: [facility];  Service: Endoscopy;;  EGD and COLON   COLONOSCOPY WITH PROPOFOL  N/A 05/05/2020   Procedure: COLONOSCOPY WITH PROPOFOL ;  Surgeon: Sergio Dandy, MD;  Location: [facility];  Service: Endoscopy;  Laterality: N/A;   ESOPHAGOGASTRODUODENOSCOPY (EGD) WITH PROPOFOL  N/A 05/05/2020   Procedure: ESOPHAGOGASTRODUODENOSCOPY (EGD) WITH PROPOFOL ;  Surgeon: Sergio Dandy, MD;  Location: [facility];  Service: Endoscopy;  Laterality: N/A;   FRACTURE SURGERY     wrist fracture 08/19/11   LAPAROSCOPIC PARTIAL COLECTOMY Right 03/12/2021   Procedure: LAPAROSCOPIC HAND ASSISTED RIGHT COLECTOMY;  Surgeon: Junie Olds, MD;  Location: [facility];  Service: General;  Laterality: Right;     Inpatient Medications: Scheduled Meds:  folic acid   1 mg Per Tube Daily   methocarbamol  (ROBAXIN ) injection  500 mg Intravenous Q8H   multivitamin with minerals  1 tablet  Oral Daily   pantoprazole  (PROTONIX ) IV  40 mg Intravenous Q12H   simethicone   80 mg Oral QID   thiamine   100 mg Oral Daily   Or   thiamine   100 mg Intravenous Daily   Continuous Infusions:  sodium chloride  125 mL/hr at 07/16/23 0417   lactated ringers      PRN Meds: acetaminophen  **OR** acetaminophen , alum & mag hydroxide-simeth, bisacodyl, diphenhydrAMINE ,  hydrALAZINE , HYDROmorphone  (DILAUDID ) injection, lactated ringers , levalbuterol, LORazepam , magic mouthwash, menthol-cetylpyridinium, naphazoline-glycerin, ondansetron  (ZOFRAN ) IV, oxyCODONE , phenol, prochlorperazine , sodium chloride   Allergies:    Allergies  Allergen Reactions   Losartan  Other (See Comments)    diarrhea    Social History:   Social History   Socioeconomic History   Marital status: Widowed    Spouse name: Not on file   Number of children: 2   Years of education: Not on file   Highest education level: Not on file  Occupational History   Not on file  Tobacco Use   Smoking status: Never   Smokeless tobacco: Never  Vaping Use   Vaping status: Never Used  Substance and Sexual Activity   Alcohol use: Not Currently    Alcohol/week: 7.0 standard drinks of alcohol    Types: 7 Glasses of wine per week    Comment: Occasional   Drug use: No   Sexual activity: Not Currently  Other Topics Concern   Not on file  Social History Narrative   Not on file   Social Drivers of Health   Financial Resource Strain: Low Risk  (08/21/2022)   Overall Financial Resource Strain (CARDIA)    Difficulty of Paying Living Expenses: Not hard at all  Food Insecurity: No Food Insecurity (07/15/2023)   Hunger Vital Sign    Worried About Running Out of Food in the Last Year: Never true    Ran Out of Food in the Last Year: Never true  Transportation Needs: No Transportation Needs (07/15/2023)   PRAPARE - Administrator, Civil Service (Medical): No    Lack of Transportation (Non-Medical): No  Physical Activity: Insufficiently Active (08/21/2022)   Exercise Vital Sign    Days of Exercise per Week: 2 days    Minutes of Exercise per Session: 20 min  Stress: No Stress Concern Present (08/21/2022)   [employer] of Occupational Health - Occupational Stress Questionnaire    Feeling of Stress : Not at all  Social Connections: Moderately Isolated (07/15/2023)   Social Connection and  Isolation Panel [NHANES]    Frequency of Communication with Friends and Family: Three times a week    Frequency of Social Gatherings with Friends and Family: Three times a week    Attends Religious Services: More than 4 times per year    Active Member of Clubs or Organizations: No    Attends Banker Meetings: Patient declined    Marital Status: Widowed  Intimate Partner Violence: Not At Risk (07/15/2023)   Humiliation, Afraid, Rape, and Kick questionnaire    Fear of Current or Ex-Partner: No    Emotionally Abused: No    Physically Abused: No    Sexually Abused: No    Family History:   Family History  Problem Relation Age of Onset   Hyperlipidemia Mother    Diabetes Neg Hx      ROS:  Please see the history of present illness.  All other ROS reviewed and negative.     Physical Exam/Data:   Vitals:   07/15/23 1747 07/15/23 2127 07/16/23  0126 07/16/23 0546  BP: (!) 142/87 (!) 162/78 134/87 131/72  Pulse: (!) 55 63 64 65  Resp: 18 18 18 18   Temp: 97.7 F (36.5 C) (!) 97.4 F (36.3 C) 98 F (36.7 C) 98 F (36.7 C)  TempSrc: Oral Oral    SpO2: 99% 94% 94% 92%    Intake/Output Summary (Last 24 hours) at 07/16/2023 0821 Last data filed at 07/16/2023 0500 Gross per 24 hour  Intake 1625.18 ml  Output 100 ml  Net 1525.18 ml      07/01/2023   12:14 PM 10/22/2022   10:17 AM 10/11/2022   11:37 AM  Last 3 Weights  Weight (lbs) 157 lb 160 lb 2 oz 156 lb  Weight (kg) 71.215 kg 72.632 kg 70.761 kg     There is no height or weight on file to calculate BMI.  General:  Well nourished, well developed, in no acute distress.  NG tube in place HEENT: normal Neck: no JVD Vascular: No carotid bruits; Distal pulses 2+ bilaterally Cardiac:  normal S1, S2; RRR; 3 out of 6 murmur at apex Lungs:  clear to auscultation bilaterally, no wheezing, rhonchi or rales, slightly diminished Abd: soft, nontender, no hepatomegaly  Ext: Very trace edema Musculoskeletal:  No deformities, BUE  and BLE strength normal and equal Skin: warm and dry  Neuro:  CNs 2-12 intact, no focal abnormalities noted Psych:  Normal affect   EKG:  The EKG was personally reviewed and demonstrates: Atrial fibrillation heart rates in the 70s.  Right bundle branch block.  QTc 615. Telemetry:  Telemetry was personally reviewed and demonstrates: She is not on telemetry, have messaged primary team  Relevant CV Studies: Echocardiogram 10/03/2022  1. Left ventricular ejection fraction, by estimation, is 65 to 70%. The  left ventricle has normal function. The left ventricle has no regional  wall motion abnormalities. There is mild asymmetric left ventricular  hypertrophy of the basal-septal segment.  Left ventricular diastolic parameters are indeterminate.   2. Right ventricular systolic function is normal. The right ventricular  size is mildly enlarged.   3. Left atrial size was severely dilated.   4. Right atrial size was severely dilated.   5. The mitral valve is degenerative. Mild to moderate mitral valve  regurgitation. Mitral valve regurgitation is anteriorly directed and  eccentric, suspect posterior leaflet prolapse and primary mitral valve  regurgitation.   6. Tricuspid valve regurgitation is moderate.   7. The aortic valve is tricuspid. There is mild calcification of the  aortic valve. There is mild thickening of the aortic valve. Aortic valve  regurgitation is not visualized. Aortic valve sclerosis/calcification is  present, without any evidence of  aortic stenosis.   8. IVC not visualized.   Laboratory Data:  High Sensitivity Troponin:  No results for input(s): "TROPONINIHS" in the last 720 hours.   Chemistry Recent Labs  Lab 07/15/23 1308 07/16/23 0511  NA 136 137  K 2.4* 2.8*  CL 93* 101  CO2 28 27  GLUCOSE 129* 95  BUN 12 7*  CREATININE 0.53 0.32*  CALCIUM  9.7 8.6*  MG 1.4*  --   GFRNONAA >60 >60  ANIONGAP 15 9    Recent Labs  Lab 07/15/23 1308  PROT 7.5  ALBUMIN 3.8   AST 32  ALT 23  ALKPHOS 120  BILITOT 1.0   Lipids No results for input(s): "CHOL", "TRIG", "HDL", "LABVLDL", "LDLCALC", "CHOLHDL" in the last 168 hours.  Hematology Recent Labs  Lab 07/15/23 1308 07/16/23 9528  WBC 8.3 6.3  RBC 3.95 3.37*  HGB 14.0 12.0  HCT 40.7 36.5  MCV 103.0* 108.3*  MCH 35.4* 35.6*  MCHC 34.4 32.9  RDW 12.7 12.8  PLT 233 166   Thyroid  No results for input(s): "TSH", "FREET4" in the last 168 hours.  BNPNo results for input(s): "BNP", "PROBNP" in the last 168 hours.  DDimer No results for input(s): "DDIMER" in the last 168 hours.   Radiology/Studies:  DG Abd Portable 1 View Result Date: 07/15/2023 CLINICAL DATA:  Nasogastric tube placement. EXAM: PORTABLE ABDOMEN - 1 VIEW COMPARISON:  None Available. FINDINGS: Nasogastric tube tip is seen in proximal stomach. Distal side hole is still in distal esophagus. IMPRESSION: Nasogastric tube tip is seen in proximal stomach with distal side hole in esophagus; advancement is recommended. Electronically Signed   By: Rosalene Colon M.D.   On: 07/15/2023 17:44   DG Chest Port 1 View Result Date: 07/15/2023 CLINICAL DATA:  Cough. EXAM: PORTABLE CHEST 1 VIEW COMPARISON:  October 02, 2022. FINDINGS: Stable cardiomegaly. Nasogastric tube tip seen in proximal stomach. Large hiatal hernia is noted. Minimal left basilar subsegmental atelectasis. Right lung is clear. IMPRESSION: Large hiatal hernia.  Minimal left basilar subsegmental atelectasis. Electronically Signed   By: Rosalene Colon M.D.   On: 07/15/2023 17:43   CT ABDOMEN PELVIS W CONTRAST Addendum Date: 07/15/2023 ADDENDUM REPORT: 07/15/2023 16:00 ADDENDUM: These results were called by telephone at the time of interpretation on 07/15/2023 at 3:59 pm to provider Dr. Lind Repine, who verbally acknowledged these results. Electronically Signed   By: Reagan Camera M.D.   On: 07/15/2023 16:00   Result Date: 07/15/2023 CLINICAL DATA:  Left-sided upper abdominal pain, vomiting  EXAM: CT ABDOMEN AND PELVIS WITH CONTRAST TECHNIQUE: Multidetector CT imaging of the abdomen and pelvis was performed using the standard protocol following bolus administration of intravenous contrast. RADIATION DOSE REDUCTION: This exam was performed according to the departmental dose-optimization program which includes automated exposure control, adjustment of the mA and/or kV according to patient size and/or use of iterative reconstruction technique. CONTRAST:  OMNIPAQUE  IOHEXOL  300 MG/ML  SOLN COMPARISON:  CT of the abdomen and pelvis performed September 17, 2021. Abdominal ultrasound performed Jul 02, 2023 FINDINGS: Lower chest: A moderate size hiatal hernia is present. The stomach is rotated in the gastric outlet is in the lower mediastinum. The second and third portions of the duodenum are in anatomic location. When compared to CT scan performed September 17, 2021, there has been an increase in the portion of the stomach which is in the lower chest as well as the degree of gastric rotation. There is enhancement of the gastric wall visualized throughout. The imaged portion of the distal esophagus is not dilated. Hepatobiliary: Not significantly changed. Pancreas: Unchanged Spleen: Within normal limits. Adrenals/Urinary Tract: Motion related changes are present in both kidneys. Small left adrenal nodule, similar. A cyst is present in the lower pole left kidney which measures 1.3 cm. Stomach/Bowel: Hiatal hernia with rotated stomach is detailed above. The small bowel is nondilated. Colonic diverticular changes are present Vascular/Lymphatic: No significant vascular findings are present. No enlarged abdominal or pelvic lymph nodes. Reproductive: Status post hysterectomy. No adnexal masses. A cystic structure is present in the low pelvis which was seen on the previous exam. This measures approximately 6.0 by 4.3 cm, not significantly changed. Other: No abdominal wall hernia or abnormality. No abdominopelvic ascites.  Musculoskeletal: Degenerative changes are present in the imaged osseous structures. Compression deformity of T12,  similar when compared to the previous exam. IMPRESSION: 1. Abnormal appearance of the stomach with herniation into the lower chest and with imaging features of gastric rotation which can be observed in the setting of gastric volvulus. Based on imaging, organo-axial rotation is favored. There is diffuse symmetric enhancement of the stomach. 2. The previously observed cystic structure in the pelvis is not significantly changed. Outpatient follow-up ultrasound should be considered. 3. Diverticulosis without evidence of diverticulitis. Electronically Signed: By: Reagan Camera M.D. On: 07/15/2023 15:40     Assessment and Plan:   Preoperative evaluation for gastric volvulus Patient with history of asymptomatic permanent atrial fibrillation that is rate controlled.  Has no evidence or symptoms that would suggest obstructive CAD.  She has chronic HFpEF but overall appears to be very well compensated, she cannot complete greater than 4 METS and is dependent on a walker however very ambulatory with this.  RCRI indicates 6% risk of MACE.  She has history of mild to moderate MR secondary to MV prolapse, but given lack of severity of disease likely not prohibitive for surgery if they want to pursue minimally invasive laparoscopic versus robotic approach.  Would correct the following issues below before proceeding. She does have severe electrolyte derangements with hypokalemia and hypomagnesia and a QTc of 615 that likely is not 100% accurate in the setting of atrial fibrillation but nonetheless still prolonged.  Repeat EKG today shows QTc improved, now 457 Would aggressively replete her electrolytes, I am repeating an EKG.  Would recommend transferring her to a telemetry bed. Will update echocardiogram.  Otherwise no further cardiac workup recommended  Permanent atrial fibrillation Asymptomatic, rate  controlled with heart rates in the 60s to 70s. Eliquis  is currently being held for surgery, resume when safe to do so.  Can start heparin drip in the meantime.  She is on appropriate oral anticoagulation dose. Okay to continue to hold beta-blocker with heart rates in the 60s.  Chronic HFpEF Looks euvolemic, functioning well compensated.  Closely monitor volume status and IV fluids.  Mitral valve regurgitation Likely secondary to prolapse of the posterior leaflet with primary MR.  She has mild to moderate MR.  Last echocardiogram 09/2022.  Will review with MD if he wants to get echocardiogram before procedure.  History of syncope This was in the setting of UTI with no recurrences.  Heart monitor ordered but never completed.  With clear underlying cause do not feel this requires further workup at this time.   Risk Assessment/Risk Scores:    New York  Heart Association (NYHA) Functional Class NYHA Class I  CHA2DS2-VASc Score = 5  This indicates a 7.2% annual risk of stroke. The patient's score is based upon: CHF History: 1 HTN History: 1 Diabetes History: 0 Stroke History: 0 Vascular Disease History: 0 Age Score: 2 Gender Score: 1    For questions or updates, please contact [facility] Please consult [email or web address] for contact info under    Signed, Burnetta Cart, PA-C  07/16/2023 8:21 AM   Patient seen and examined.  Agree with above documentation.  Linda Glass is an 89 year old female with history of permanent atrial fibrillation, chronic diastolic heart failure who are consulted by Dr. Hershell Lose for preop evaluation.  She was initially diagnosed with atrial fibrillation in 2022.  Now felt to have permanent A-fib.  She underwent echocardiogram 09/2022 which showed normal biventricular function, mild to moderate mitral regurgitation, moderate tricuspid regurgitation.  She was admitted with nausea/vomiting due to gastric volvulus.  Found to  have significant electrolyte  abnormalities, with potassium down to 2.4 and magnesium  1.4.  Eliquis  was held with plans for possible OR.  Cardiology consulted for preop evaluation.  On exam, patient is alert and oriented, irregular rhythm, normal rate, 2 out of 6 systolic murmur, lungs CTAB, no LE edema or JVD.  For preop evaluation, she denies any anginal symptoms.  Activity is limited due to orthopedic issues but she reports as recently as March she walked up flight of stairs without any chest pain or dyspnea.  Will update echocardiogram, otherwise no further workup recommended prior to surgery.  EKG showed prolonged QT, but after electrolyte repletion repeat EKG showed resolution.  Wendie Hamburg, MD

## 2023-07-17 ENCOUNTER — Inpatient Hospital Stay (HOSPITAL_COMMUNITY): Admitting: Certified Registered"

## 2023-07-17 ENCOUNTER — Other Ambulatory Visit: Payer: Self-pay

## 2023-07-17 ENCOUNTER — Encounter (HOSPITAL_COMMUNITY)
Admission: EM | Disposition: A | Discharge status: Home Health | Payer: Self-pay | Source: Skilled Nursing Facility | Attending: Family Medicine

## 2023-07-17 ENCOUNTER — Encounter (HOSPITAL_COMMUNITY): Payer: Self-pay | Admitting: Internal Medicine

## 2023-07-17 DIAGNOSIS — I4821 Permanent atrial fibrillation: Secondary | ICD-10-CM | POA: Diagnosis not present

## 2023-07-17 DIAGNOSIS — I4891 Unspecified atrial fibrillation: Secondary | ICD-10-CM

## 2023-07-17 DIAGNOSIS — E785 Hyperlipidemia, unspecified: Secondary | ICD-10-CM | POA: Diagnosis not present

## 2023-07-17 DIAGNOSIS — K440 Diaphragmatic hernia with obstruction, without gangrene: Secondary | ICD-10-CM

## 2023-07-17 DIAGNOSIS — I4819 Other persistent atrial fibrillation: Secondary | ICD-10-CM | POA: Diagnosis not present

## 2023-07-17 DIAGNOSIS — K3189 Other diseases of stomach and duodenum: Secondary | ICD-10-CM | POA: Diagnosis not present

## 2023-07-17 LAB — CBC
HCT: 38.9 % (ref 36.0–46.0)
Hemoglobin: 12.3 g/dL (ref 12.0–15.0)
MCH: 35.5 pg — ABNORMAL HIGH (ref 26.0–34.0)
MCHC: 31.6 g/dL (ref 30.0–36.0)
MCV: 112.4 fL — ABNORMAL HIGH (ref 80.0–100.0)
Platelets: 170 10*3/uL (ref 150–400)
RBC: 3.46 MIL/uL — ABNORMAL LOW (ref 3.87–5.11)
RDW: 12.8 % (ref 11.5–15.5)
WBC: 8.2 10*3/uL (ref 4.0–10.5)
nRBC: 0 % (ref 0.0–0.2)

## 2023-07-17 LAB — BASIC METABOLIC PANEL WITH GFR
Anion gap: 12 (ref 5–15)
BUN: 9 mg/dL (ref 8–23)
CO2: 20 mmol/L — ABNORMAL LOW (ref 22–32)
Calcium: 8.8 mg/dL — ABNORMAL LOW (ref 8.9–10.3)
Chloride: 104 mmol/L (ref 98–111)
Creatinine, Ser: 0.49 mg/dL (ref 0.44–1.00)
GFR, Estimated: >60 mL/min (ref >60)
Glucose, Bld: 63 mg/dL — ABNORMAL LOW (ref 70–99)
Potassium: 3.4 mmol/L — ABNORMAL LOW (ref 3.5–5.1)
Sodium: 136 mmol/L (ref 135–145)

## 2023-07-17 LAB — MAGNESIUM: Magnesium: 1.8 mg/dL (ref 1.7–2.4)

## 2023-07-17 SURGERY — REPAIR, HERNIA, PARAESOPHAGEAL, ROBOT-ASSISTED
Anesthesia: General

## 2023-07-17 MED ORDER — PROPOFOL 10 MG/ML IV BOLUS
INTRAVENOUS | Status: DC | PRN
  Administered 2023-07-17: 70 mg via INTRAVENOUS

## 2023-07-17 MED ORDER — AMISULPRIDE (ANTIEMETIC) 5 MG/2ML IV SOLN
10.0000 mg | Freq: Once | INTRAVENOUS | Status: AC | PRN
  Administered 2023-07-17: 10 mg via INTRAVENOUS

## 2023-07-17 MED ORDER — FENTANYL CITRATE PF 50 MCG/ML IJ SOSY
PREFILLED_SYRINGE | INTRAMUSCULAR | Status: AC
  Filled 2023-07-17: qty 1

## 2023-07-17 MED ORDER — BUPIVACAINE-EPINEPHRINE 0.25% -1:200000 IJ SOLN
INTRAMUSCULAR | Status: DC | PRN
  Administered 2023-07-17: 30 mL

## 2023-07-17 MED ORDER — AMISULPRIDE (ANTIEMETIC) 5 MG/2ML IV SOLN
INTRAVENOUS | Status: AC
  Filled 2023-07-17: qty 4

## 2023-07-17 MED ORDER — APIXABAN 5 MG PO TABS
5.0000 mg | ORAL_TABLET | Freq: Two times a day (BID) | ORAL | Status: DC
  Administered 2023-07-18 – 2023-07-21 (×7): 5 mg via ORAL
  Filled 2023-07-17 (×7): qty 1

## 2023-07-17 MED ORDER — SODIUM CHLORIDE 0.9% FLUSH
3.0000 mL | Freq: Two times a day (BID) | INTRAVENOUS | Status: DC
  Administered 2023-07-17 – 2023-07-21 (×6): 3 mL via INTRAVENOUS

## 2023-07-17 MED ORDER — ORAL CARE MOUTH RINSE: 15.0000 mL | Freq: Once | OROMUCOSAL | Status: AC

## 2023-07-17 MED ORDER — LACTATED RINGERS IV BOLUS: 1000.0000 mL | Freq: Three times a day (TID) | INTRAVENOUS | Status: AC | PRN

## 2023-07-17 MED ORDER — MIDAZOLAM HCL 2 MG/2ML IJ SOLN
INTRAMUSCULAR | Status: AC
  Filled 2023-07-17: qty 2

## 2023-07-17 MED ORDER — CHLORHEXIDINE GLUCONATE 0.12 % MT SOLN
15.0000 mL | Freq: Once | OROMUCOSAL | Status: AC
  Administered 2023-07-17: 15 mL via OROMUCOSAL

## 2023-07-17 MED ORDER — ACETAMINOPHEN 10 MG/ML IV SOLN
INTRAVENOUS | Status: DC | PRN
  Administered 2023-07-17: 1000 mg via INTRAVENOUS

## 2023-07-17 MED ORDER — 0.9 % SODIUM CHLORIDE (POUR BTL) OPTIME
TOPICAL | Status: DC | PRN
  Administered 2023-07-17: 1000 mL

## 2023-07-17 MED ORDER — SODIUM CHLORIDE 0.9 % IV SOLN
INTRAVENOUS | Status: AC
  Filled 2023-07-17: qty 20

## 2023-07-17 MED ORDER — PHENYLEPHRINE HCL-NACL 20-0.9 MG/250ML-% IV SOLN
INTRAVENOUS | Status: AC
  Filled 2023-07-17: qty 250

## 2023-07-17 MED ORDER — DEXAMETHASONE SODIUM PHOSPHATE 10 MG/ML IJ SOLN
INTRAMUSCULAR | Status: DC | PRN
  Administered 2023-07-17: 10 mg via INTRAVENOUS

## 2023-07-17 MED ORDER — OXYCODONE HCL 5 MG/5ML PO SOLN: 5.0000 mg | Freq: Once | ORAL | Status: DC | PRN

## 2023-07-17 MED ORDER — PHENYLEPHRINE HCL-NACL 20-0.9 MG/250ML-% IV SOLN
INTRAVENOUS | Status: DC | PRN
  Administered 2023-07-17: 50 ug/min via INTRAVENOUS

## 2023-07-17 MED ORDER — BUPIVACAINE-EPINEPHRINE (PF) 0.25% -1:200000 IJ SOLN
INTRAMUSCULAR | Status: AC
  Filled 2023-07-17: qty 60

## 2023-07-17 MED ORDER — OXYCODONE HCL 5 MG PO TABS: 5.0000 mg | ORAL_TABLET | Freq: Once | ORAL | Status: DC | PRN

## 2023-07-17 MED ORDER — ONDANSETRON HCL 4 MG/2ML IJ SOLN
INTRAMUSCULAR | Status: DC | PRN
  Administered 2023-07-17: 4 mg via INTRAVENOUS

## 2023-07-17 MED ORDER — ROCURONIUM BROMIDE 10 MG/ML (PF) SYRINGE
PREFILLED_SYRINGE | INTRAVENOUS | Status: DC | PRN
  Administered 2023-07-17: 10 mg via INTRAVENOUS
  Administered 2023-07-17: 90 mg via INTRAVENOUS

## 2023-07-17 MED ORDER — ACETAMINOPHEN 500 MG PO TABS
1000.0000 mg | ORAL_TABLET | Freq: Four times a day (QID) | ORAL | Status: DC
  Administered 2023-07-17: 1000 mg via ORAL
  Filled 2023-07-17: qty 2

## 2023-07-17 MED ORDER — METHOCARBAMOL 1000 MG/10ML IJ SOLN: 1000.0000 mg | Freq: Four times a day (QID) | INTRAMUSCULAR | Status: DC | PRN

## 2023-07-17 MED ORDER — ACETAMINOPHEN 10 MG/ML IV SOLN
INTRAVENOUS | Status: AC
  Filled 2023-07-17: qty 100

## 2023-07-17 MED ORDER — LIDOCAINE 2% (20 MG/ML) 5 ML SYRINGE
INTRAMUSCULAR | Status: DC | PRN
  Administered 2023-07-17: 60 mg via INTRAVENOUS

## 2023-07-17 MED ORDER — SODIUM CHLORIDE 0.9 % IV SOLN: INTRAVENOUS | Status: AC

## 2023-07-17 MED ORDER — SUCCINYLCHOLINE CHLORIDE 200 MG/10ML IV SOSY
PREFILLED_SYRINGE | INTRAVENOUS | Status: DC | PRN
  Administered 2023-07-17: 140 mg via INTRAVENOUS

## 2023-07-17 MED ORDER — SODIUM CHLORIDE 0.9% FLUSH
3.0000 mL | INTRAVENOUS | Status: DC | PRN
Start: 2023-07-17 — End: 2023-07-21
  Administered 2023-07-17: 3 mL via INTRAVENOUS

## 2023-07-17 MED ORDER — FENTANYL CITRATE (PF) 250 MCG/5ML IJ SOLN
INTRAMUSCULAR | Status: AC
  Filled 2023-07-17: qty 5

## 2023-07-17 MED ORDER — FENTANYL CITRATE (PF) 250 MCG/5ML IJ SOLN
INTRAMUSCULAR | Status: DC | PRN
  Administered 2023-07-17 (×4): 50 ug via INTRAVENOUS

## 2023-07-17 MED ORDER — LACTATED RINGERS IV SOLN: INTRAVENOUS | Status: DC

## 2023-07-17 MED ORDER — ACETAMINOPHEN 500 MG PO TABS
1000.0000 mg | ORAL_TABLET | Freq: Once | ORAL | Status: DC
  Filled 2023-07-17: qty 2

## 2023-07-17 MED ORDER — PROPOFOL 10 MG/ML IV BOLUS
INTRAVENOUS | Status: AC
  Filled 2023-07-17: qty 20

## 2023-07-17 MED ORDER — STERILE WATER FOR IRRIGATION IR SOLN
Status: DC | PRN
  Administered 2023-07-17: 1000 mL

## 2023-07-17 MED ORDER — SODIUM CHLORIDE 0.9 % IV SOLN: 250.0000 mL | INTRAVENOUS | Status: DC | PRN

## 2023-07-17 MED ORDER — FENTANYL CITRATE PF 50 MCG/ML IJ SOSY
25.0000 ug | PREFILLED_SYRINGE | INTRAMUSCULAR | Status: DC | PRN
Start: 2023-07-17 — End: 2023-07-17
  Administered 2023-07-17: 50 ug via INTRAVENOUS

## 2023-07-17 MED ORDER — PHENYLEPHRINE 80 MCG/ML (10ML) SYRINGE FOR IV PUSH (FOR BLOOD PRESSURE SUPPORT)
PREFILLED_SYRINGE | INTRAVENOUS | Status: DC | PRN
  Administered 2023-07-17 (×4): 160 ug via INTRAVENOUS

## 2023-07-17 MED ORDER — BUPIVACAINE LIPOSOME 1.3 % IJ SUSP
INTRAMUSCULAR | Status: AC
  Filled 2023-07-17: qty 20

## 2023-07-17 SURGICAL SUPPLY — 61 items
APPLICATOR COTTON TIP 6 STRL (MISCELLANEOUS) ×1 IMPLANT
APPLICATOR COTTON TIP 6IN STRL (MISCELLANEOUS) ×1 IMPLANT
BAG COUNTER SPONGE SURGICOUNT (BAG) ×1 IMPLANT
BLADE SURG SZ11 CARB STEEL (BLADE) ×1 IMPLANT
CHLORAPREP W/TINT 26 (MISCELLANEOUS) ×1 IMPLANT
CLIP APPLIE 5 13 M/L LIGAMAX5 (MISCELLANEOUS) IMPLANT
COVER SURGICAL LIGHT HANDLE (MISCELLANEOUS) ×1 IMPLANT
COVER TIP SHEARS 8 DVNC (MISCELLANEOUS) IMPLANT
DRAIN CHANNEL 19F RND (DRAIN) IMPLANT
DRAIN PENROSE 0.5X18 (DRAIN) IMPLANT
DRAPE ARM DVNC X/XI (DISPOSABLE) ×4 IMPLANT
DRAPE COLUMN DVNC XI (DISPOSABLE) ×1 IMPLANT
DRAPE WARM FLUID 44X44 (DRAPES) ×1 IMPLANT
DRIVER NDL LRG 8 DVNC XI (INSTRUMENTS) ×2 IMPLANT
DRIVER NDL MEGA SUTCUT DVNCXI (INSTRUMENTS) ×1 IMPLANT
DRIVER NDLE LRG 8 DVNC XI (INSTRUMENTS) ×2 IMPLANT
DRIVER NDLE MEGA SUTCUT DVNCXI (INSTRUMENTS) ×1 IMPLANT
DRSG TEGADERM 2-3/8X2-3/4 SM (GAUZE/BANDAGES/DRESSINGS) ×5 IMPLANT
ELECT REM PT RETURN 15FT ADLT (MISCELLANEOUS) ×1 IMPLANT
ENDOLOOP SUT PDS II 0 18 (SUTURE) IMPLANT
EVACUATOR DRAINAGE 10X20 100CC (DRAIN) IMPLANT
EVACUATOR SILICONE 100CC (DRAIN) IMPLANT
FELT TEFLON 4 X1 (Mesh General) ×1 IMPLANT
FORCEPS PROGRASP DVNC XI (FORCEP) ×1 IMPLANT
GAUZE SPONGE 2X2 8PLY STRL LF (GAUZE/BANDAGES/DRESSINGS) ×1 IMPLANT
GLOVE ECLIPSE 8.0 STRL XLNG CF (GLOVE) ×2 IMPLANT
GLOVE INDICATOR 8.0 STRL GRN (GLOVE) ×2 IMPLANT
GOWN STRL REUS W/ TWL XL LVL3 (GOWN DISPOSABLE) ×2 IMPLANT
GRASPER SUT TROCAR 14GX15 (MISCELLANEOUS) IMPLANT
GRASPER TIP-UP FEN DVNC XI (INSTRUMENTS) ×1 IMPLANT
IRRIGATION SUCT STRKRFLW 2 WTP (MISCELLANEOUS) ×1 IMPLANT
KIT BASIN OR (CUSTOM PROCEDURE TRAY) ×1 IMPLANT
KIT TURNOVER KIT A (KITS) IMPLANT
MESH BIO-A 7X10 SYN MAT (Mesh General) IMPLANT
NDL HYPO 22X1.5 SAFETY MO (MISCELLANEOUS) ×1 IMPLANT
NEEDLE HYPO 22X1.5 SAFETY MO (MISCELLANEOUS) ×1 IMPLANT
PACK CARDIOVASCULAR III (CUSTOM PROCEDURE TRAY) ×1 IMPLANT
PAD POSITIONING PINK XL (MISCELLANEOUS) ×1 IMPLANT
PENCIL SMOKE EVACUATOR (MISCELLANEOUS) IMPLANT
SCISSORS LAP 5X45 EPIX DISP (ENDOMECHANICALS) IMPLANT
SCISSORS MNPLR CVD DVNC XI (INSTRUMENTS) ×1 IMPLANT
SEAL UNIV 5-12 XI (MISCELLANEOUS) ×3 IMPLANT
SEALER VESSEL EXT DVNC XI (MISCELLANEOUS) ×1 IMPLANT
SOLUTION ELECTROSURG ANTI STCK (MISCELLANEOUS) ×1 IMPLANT
SPIKE FLUID TRANSFER (MISCELLANEOUS) ×1 IMPLANT
STOPCOCK 4 WAY LG BORE MALE ST (IV SETS) ×2 IMPLANT
SUT ETHIBOND 0 36 GRN (SUTURE) ×2 IMPLANT
SUT ETHIBOND NAB CT1 #1 30IN (SUTURE) ×3 IMPLANT
SUT MNCRL AB 4-0 PS2 18 (SUTURE) ×1 IMPLANT
SUT PROLENE 2 0 SH DA (SUTURE) IMPLANT
SUT STRATA PDS 2-0 23 CT-1 (SUTURE) IMPLANT
SUT VICRYL 0 UR6 27IN ABS (SUTURE) IMPLANT
SUT VLOC 180 0 9IN GS21 (SUTURE) IMPLANT
SUTURE V-LC BRB 180 2/0GR6GS22 (SUTURE) IMPLANT
SYR 10ML LL (SYRINGE) ×1 IMPLANT
SYR 20ML LL LF (SYRINGE) ×1 IMPLANT
TOWEL OR 17X26 10 PK STRL BLUE (TOWEL DISPOSABLE) ×1 IMPLANT
TRAY FOLEY MTR SLVR 14FR STAT (SET/KITS/TRAYS/PACK) IMPLANT
TRAY FOLEY MTR SLVR 16FR STAT (SET/KITS/TRAYS/PACK) IMPLANT
TROCAR ADV FIXATION 5X100MM (TROCAR) ×1 IMPLANT
TUBING INSUFFLATION 10FT LAP (TUBING) ×1 IMPLANT

## 2023-07-17 NOTE — Op Note (Signed)
 07/17/2023  10:50 AM  PATIENT:  Linda Glass  89 y.o. female  Patient Care Team: [name], [name], MD as PCP - General (Family Medicine) Sheryle Donning, MD as PCP - Cardiology (Cardiology) Alver Austin, [facility] (Inactive) as Pharmacist (Pharmacist) Maudine Sos, MD as Attending Physician (Cardiology) Sergio Dandy, MD as Consulting Physician (Gastroenterology) Candyce Champagne, MD as Consulting Physician (General Surgery)  PRE-OPERATIVE DIAGNOSIS:  Paraesophageal hiatal hernia incarcerated with volvulus and obstruction  POST-OPERATIVE DIAGNOSIS:  Paraesophageal hiatal hernia incarcerated with volvulus and obstruction  PROCEDURE:   1. ROBOTIC reduction of paraesophageal hiatal hernia 2. Type II mediastinal dissection. 3. Bilateral crural release with component separation 4. Primary repair of hiatal hernia over pledgets.  5.  Mesh reinforcement with absorbable mesh (BioA) 6.  Anterior & posterior gastropexy. 7.  Toupet (270 degree partial posterior x 4cm)  fundoplication  SURGEON:  Eddye Goodie, MD  ASSISTANT:  (n/a)   ANESTHESIA:  General endotracheal intubation anesthesia (GETA) and Local & regional field block at incision(s) for perioperative & postoperative pain control provided with liposomal bupivacaine  (Experel) 20mL mixed with 30mL of bupivicaine 0.25% with epinephrine   Estimated Blood Loss (EBL):   Total I/O In: 1000 [I.V.:1000] Out: 400 [Urine:400].   (See anesthesia record)  Delay start of Pharmacological VTE agent (>24hrs) due to concerns of significant anemia, surgical blood loss, or risk of bleeding?:  no  DRAINS: (None) and 19 Fr Blake drain with tip resting in the mediastinum  SPECIMEN:  Hernia sac (not sent)  DISPOSITION OF SPECIMEN:  (not applicable)  COUNTS:  Sponge, needle, & instrument counts CORRECT at the conclusion of the case.      PLAN OF CARE: Admit to inpatient   PATIENT DISPOSITION:  PACU - hemodynamically  stable.  INDICATION:   Patient with relatively asymptomatic hiatal hernia with nausea and vomiting and found to have regression with entire stomach flipped and mesenteric axial volvulus and obstruction.  NG tube resuscitation.  Anticoagulation held.  Had medical and cardiac clearance.  Recommended patient undergo reduction of hiatal hernia and possible fundoplication.  Patient wished to be aggressive and proceed with surgery.    The anatomy & physiology of the foregut and anti-reflux mechanism was discussed.  The pathophysiology of hiatal herniation and GERD was discussed.  Natural history risks without surgery was discussed.   The patient's symptoms are not adequately controlled by medicines and other non-operative treatments.  I feel the risks of no intervention will lead to serious problems that outweigh the operative risks; therefore, I recommended surgery to reduce the hiatal hernia out of the chest and fundoplication to rebuild the anti-reflux valve and control reflux better.  Need for a thorough workup to rule out the differential diagnosis and plan treatment was explained.  I explained laparoscopic techniques with possible need for an open approach.  Risks such as bleeding, infection, abscess, leak, need for further treatment, heart attack, death, and other risks were discussed.   I noted a good likelihood this will help address the problem.  Goals of post-operative recovery were discussed as well.  Possibility that this will not correct all symptoms was explained.  Post-operative dysphagia, need for short-term liquid & pureed diet, inability to vomit, possibility of reherniation, possible need for medicines to help control symptoms in addition to surgery were discussed.  We will work to minimize complications.   Educational handouts further explaining the pathology, treatment options, and dysphagia diet was given as well.  Questions were answered.  The patient expresses  understanding & wishes to  proceed with surgery.  OR FINDINGS:   Large paraesophageal hiatal hernia with 80% of the stomach in the mediastinum.  There was a 10 x 10 cm hiatal defect.  It is a primary repair over pledgets.  Mesh reinforcement was used with GORE BIO-A mesh, a biosynthetic web scaffold made of 67% polyglycolic acid (PGA): 33% trimethylene carbonate (TMC).  The patient has a Toupet (270 degree partial posterior x 4cm)  fundoplication.  The patient has had anterior and posterior gastropexy.  DESCRIPTION:   Informed consent was confirmed.  The patient received IV antibiotics prior to incision.  The underwent general anesthesia without difficulty.  A Foley catheter sterilely placed.  The patient was positioned in split leg with arms tucked. The abdomen was prepped and draped in the sterile fashion.  Surgical time-out confirmed our plan.  I placed a 5 mm port in the left subcostal region using Varess entry technique with the patient in steep reverse Trendelenburg and left side up.  Entry was clean.  We induced carbon dioxide insufflation.  Camera inspection revealed no injury.  Under direct visualization, I placed extra ports.  I also placed a 5 mm port in the left subxiphoid region under direct visualization.  I removed that and placed an Omega-shaped rigid Nathanson liver retractor to lift the left lateral sector of the liver anteriorly to expose the esophageal hiatus.  This was secured to the bed using the iron  man system.  The Xi robot was carefully docked and instruments placed and advanced under direct visualization.  We focused on dissection.  We grasped the anterior mediastinal sac at the apex of the crus.  I scored through that and got into the anterior mediastinum.  I was able to free the mediastinal sac from its attachments to the pericardium and bilateral pleura using primarily focused gentle blunt dissection as well as focused vessel sealer dissection.  I transected phrenoesophageal attachments to the  inner right crus, preserving a two centimeter cuff of mediastinal sac until I found the base of the crura.  I then came around anteriorly on the left side and freed up the phrenoesophageal attachments of the mediastinal sac on the medial part of the left crus on the superior half.  I did careful mediastinal dissection to free the mediastinal sac.  With that, we could relieve the suction cup affect of the hernia sac and help reduce the stomach back down into the abdomen, flipped back appropriately from a chronic mesentero-axial twisting/volvulus.  Stomach looked viable.  I ligated the short gastrics along the lesser curvature of the stomach about a third the way down and then came up proximally over the fundus.  We released the attachments of the stomach to the retroperitoneum until we were able to connect with the prior dissection on the left crus.  We completed the release of phrenoesophageal attachments to the medial part of the left crus down to its base.  With this, we had circumferential mobilization.    We placed the stomach and esophagus on axial tension.  I then did a Type II mediastinal dissection where I freed the esophagus from its attachments to the aorta, spine, pleura, and pericardium using primarily gentle blunt as well as focused vessel sealer dissection.  We saw the anterior & posterior vagus nerves intact.  There was dense adhesions and hernia sac to both pleura.  Ended up opening the right pleura and left pleura broadly to get better decompression and visualization.  Anesthesia updated.  Lowered carbon oxide insufflation.  Focused to preserve the vagus nerves at all times.  I procedded to dissect about 20 cm proximally into the mediastinum.  With that I could straighten out the esophagus and get 4-5 cm of intra-abdominal length of the esophagus at a best estimation.  I freed the anterior mediastinal sac off the esophagus & stomach.  We saw the anterior vagus nerve and freed the sac off of  the vagus.  I dissected out & removed the fatty  epiphrenic pads at the esophagogastric junction. With that, I could better define the esophagogastric junction.  I confirmed the the patient had 5 cm of intra-abdominal esophageal length off tension.  I brought the fundus of the stomach posterior to the esophagus over to the right side.  The wrap was mobile with the classic shoe shine maneuver.  Wrap became together gently.  The crura had significant hiatal hernia.  I ended up coming through the fascia of the right mid to anterior crura a centimeter medial to the IVC and containing cephalad, anterior to the phrenic vein, to get 2-3 cm crural release.  Came through the left diaphragm 6 cm lateral to the left crus also in a sagittal plane to get relaxation and several centimeters of relaxation.  With that the crura could come together much better without tension.  We reflected the stomach left laterally and closed the esophageal hiatus using #1 Ethibond stitch using horizontal mattress stitches with pledgets on both sides.  I did that x2 stitches.  I then did a third #1 Ethibond horizontal mattress suture to close the hiatal hernia at the most superior junction without pledgets.  The crura had good substance and they came together well without any tension.  Because of the larger defect requiring bilateral crural releases and partial component separation of the diaphragm, I reinforced the repair using a Bio-A 10x7 cm biosynthetic precut mesh. We brought the mesh in and laid it over the crural repair, tails anterior over the crura.  I tucked the broader "U" tail of the mesh between the left diaphragm and the spleen, the narrower "U" tail over the right crus.  Used the most cephalad Ethibond crural closure to come in and out the base of the you have the mesh to centrally tack the mesh.  I secured to the left lateral and left superior sides of the broader "U" tail to the left diaphragm band with 2-0 V lock running  suture.  Secured the narrow towel to the right crus using a running 2-0 V-lock suture as well  I brought the fundus of the stomach behind the esophagus and cardia to set up a fundoplication wrap.  I placed a stitch on the inner right crus, superior part of the wrap, superior lateral esophagus and tied that down for a right anterior gastropexy.  I did a similar stitch on the left anterior side as well.  That way the stomach covered the mesh and protected it from any esophageal exposure.  With the anterior and posterior gastropexies, stomach laid well for a fundoplication wrap.  I then did a classic 4cm Toupet fundoplication on the true esophagus above the cardia using 0 Ethibond stitch in the left superior side of the wrap, apex of the left inner crus then left anterior esophagus and tied that down to do a left anterior gastropexy.  Did a mirror-image stitch on the right side to do a right anterior gastropexy.  I then did 2 more distal pairs of suture  between the inner part of the wrap and the anterolateral esophagus.  That way there were 3 total pairs of fundoplication sutures offering a posterior 270 degree wrap.  Measured at 4 cm.  Classic Toupet fundoplication.    The wrap was soft and floppy.  I placed a drain as noted above.  I did irrigation and ensured hemostasis.  I saw no evidence of any leak or perforation or other abnormality.  Hernia sac epiploic appendages and needles/suture removed.  Count correct.  Robot was undocked.  I removed the [facility] liver retractor under direct visualization.  I evacuated carbon dioxide and removed the ports.  The skin was closed with Monocryl and sterile dressings applied.  The patient is being extubated and brought back to the recovery room.  I discussed postop care in detail with the patient on the floor yesterday and in the holding area this morning.  I discussed operative findings, updated the patient's status, discussed probable steps to recovery, and gave  postoperative recommendations to the patient's son-in-law, Linda Glass, per the patient's request.  Recommendations were made.  Questions were answered.  He expressed understanding & appreciation.   Eddye Goodie, M.D., F.A.C.S. Gastrointestinal and Minimally Invasive Surgery [facility] [address] [place], [place] [address] [phone] Main / Paging

## 2023-07-17 NOTE — Discharge Instructions (Signed)
 EATING AFTER YOUR ESOPHAGEAL SURGERY (Stomach Fundoplication, Hiatal Hernia repair, Achalasia surgery, etc)  ######################################################################  EAT Start with a pureed / full liquid diet (see below) Gradually transition to a high fiber diet with a fiber supplement over the next month after discharge.    WALK Walk an hour a day.  Control your pain to do that.    CONTROL PAIN Control pain so that you can walk, sleep, tolerate sneezing/coughing, go up/down stairs.  HAVE A BOWEL MOVEMENT DAILY Keep your bowels regular to avoid problems.  OK to try a laxative to override constipation.  OK to use an antidairrheal to slow down diarrhea.  Call if not better after 2 tries  CALL IF YOU HAVE PROBLEMS/CONCERNS Call if you are still struggling despite following these instructions. Call if you have concerns not answered by these instructions  ######################################################################   After your esophageal surgery, expect some sticking with swallowing over the next 1-2 months.    If food sticks when you eat, it is called "dysphagia".  This is due to swelling around your esophagus at the wrap & hiatal diaphragm repair.  It will gradually ease off over the next few months.  To help you through this temporary phase, we start you out on a pureed (blenderized) diet.  Your first meal in the hospital was thin liquids.  You should have been given a pureed diet by the time you left the hospital.  We ask patients to stay on a pureed diet for the first 2-3 weeks to avoid anything getting "stuck" near your recent surgery.  Don't be alarmed if your ability to swallow doesn't progress according to this plan.  Everyone is different and some diets can advance more or less quickly.    It is often helpful to crush your medications or split them as they can sometimes stick, especially the first week or so.   Some BASIC RULES to follow are: Maintain  an upright position whenever eating or drinking. Take small bites - just a teaspoon size bite at a time. Eat slowly.  It may also help to eat only one food at a time. Consider nibbling through smaller, more frequent meals & avoid the urge to eat BIG meals Do not push through feelings of fullness, nausea, or bloatedness Do not mix solid foods and liquids in the same mouthful Try not to "wash foods down" with large gulps of liquids. Avoid carbonated (bubbly/fizzy) drinks.   Avoid foods that make you feel gassy or bloated.  Start with bland foods first.  Wait on trying greasy, fried, or spicy meals until you are tolerating more bland solids well. Understand that it will be hard to burp and belch at first.  This gradually improves with time.  Expect to be more gassy/flatulent/bloated initially.  Walking will help your body manage it better. Consider using medications for bloating that contain simethicone  such as  Maalox or Gas-X  Consider crushing her medications, especially smaller pills.  The ability to swallow pills should get easier after a few weeks Eat in a relaxed atmosphere & minimize distractions. Avoid talking while eating.   Do not use straws. Following each meal, sit in an upright position (90 degree angle) for 60 to 90 minutes.  Going for a short walk can help as well If food does stick, don't panic.  Try to relax and let the food pass on its own.  Sipping WARM LIQUID such as strong hot black tea can also help slide it down.  Be gradual in changes & use common sense:  -If you easily tolerating a certain "level" of foods, advance to the next level gradually -If you are having trouble swallowing a particular food, then avoid it.   -If food is sticking when you advance your diet, go back to thinner previous diet (the lower LEVEL) for 1-2 days.  LEVEL 1 = PUREED DIET  Do for the first 2 WEEKS AFTER SURGERY  -Foods in this group are pureed or blenderized to a smooth, mashed  potato-like consistency.  -If necessary, the pureed foods can keep their shape with the addition of a thickening agent.   -Meat should be pureed to a smooth, pasty consistency.  Hot broth or gravy may be added to the pureed meat, approximately 1 oz. of liquid per 3 oz. serving of meat. -CAUTION:  If any foods do not puree into a smooth consistency, swallowing will be more difficult.  (For example, nuts or seeds sometimes do not blend well.)  Hot Foods Cold Foods  Pureed scrambled eggs and cheese Pureed cottage cheese  Baby cereals Thickened juices and nectars  Thinned cooked cereals (no lumps) Thickened milk or eggnog  Pureed [place] toast or pancakes Ensure  Mashed potatoes Ice cream  Pureed parsley, au gratin, scalloped potatoes, candied sweet potatoes Fruit or [place] ice, sherbet  Pureed buttered or alfredo noodles Plain yogurt  Pureed vegetables (no corn or peas) Instant breakfast  Pureed soups and creamed soups Smooth pudding, mousse, custard  Pureed scalloped apples Whipped gelatin  Gravies Sugar, syrup, honey, jelly  Sauces, cheese, tomato, barbecue, white, creamed Cream  Any baby food Creamer  Alcohol in moderation (not beer or champagne) Margarine  Coffee or tea Mayonnaise   Ketchup, mustard   Apple sauce   SAMPLE MENU:  PUREED DIET Breakfast Lunch Dinner  Orange juice, 1/2 cup Cream of wheat, 1/2 cup Pineapple juice, 1/2 cup Pureed [place], barley soup, 3/4 cup Pureed Hawaiian chicken, 3 oz  Scrambled eggs, mashed or blended with cheese, 1/2 cup Tea or coffee, 1 cup  Whole milk, 1 cup  Non-dairy creamer, 2 Tbsp. Mashed potatoes, 1/2 cup Pureed cooled broccoli, 1/2 cup Apple sauce, 1/2 cup Coffee or tea Mashed potatoes, 1/2 cup Pureed spinach, 1/2 cup Frozen yogurt, 1/2 cup Tea or coffee      LEVEL 2 = SOFT DIET  After your first 2 weeks, you can advance to a soft diet.   Keep on this diet until everything goes down easily.  Hot Foods Cold Foods  White fish  Cottage cheese  Stuffed fish Junior baby fruit  Baby food meals Semi thickened juices  Minced soft cooked, scrambled, poached eggs nectars  Souffle & omelets Ripe mashed bananas  Cooked cereals Canned fruit, pineapple sauce, milk  potatoes Milkshake  Buttered or Alfredo noodles Custard  Cooked cooled vegetable Puddings, including tapioca  Sherbet Yogurt  Vegetable soup or alphabet soup Fruit ice, [place] ice  Gravies Whipped gelatin  Sugar, syrup, honey, jelly Junior baby desserts  Sauces:  Cheese, creamed, barbecue, tomato, white Cream  Coffee or tea Margarine   SAMPLE MENU:  LEVEL 2 Breakfast Lunch Dinner  Orange juice, 1/2 cup Oatmeal, 1/2 cup Scrambled eggs with cheese, 1/2 cup Decaffeinated tea, 1 cup Whole milk, 1 cup Non-dairy creamer, 2 Tbsp Pineapple juice, 1/2 cup Minced beef, 3 oz Gravy, 2 Tbsp Mashed potatoes, 1/2 cup Minced fresh broccoli, 1/2 cup Applesauce, 1/2 cup Coffee, 1 cup [place], barley soup, 3/4 cup Minced Hawaiian chicken, 3 oz  Mashed potatoes, 1/2 cup Cooked spinach, 1/2 cup Frozen yogurt, 1/2 cup Non-dairy creamer, 2 Tbsp      LEVEL 3 = CHOPPED DIET  -After all the foods in level 2 (soft diet) are passing through well you should advance up to more chopped foods.  -It is still important to cut these foods into small pieces and eat slowly.  Hot Foods Cold Foods  Poultry Cottage cheese  Chopped Swedish meatballs Yogurt  Meat salads (ground or flaked meat) Milk  Flaked fish (tuna) Milkshakes  Poached or scrambled eggs Soft, cold, dry cereal  Souffles and omelets Fruit juices or nectars  Cooked cereals Chopped canned fruit  Chopped [place] toast or pancakes Canned fruit cocktail  Noodles or pasta (no rice) Pudding, mousse, custard  Cooked vegetables (no frozen peas, corn, or mixed vegetables) Green salad  Canned small sweet peas Ice cream  Creamed soup or vegetable soup Fruit ice, [place] ice  Pureed vegetable soup or alphabet soup  Non-dairy creamer  Ground scalloped apples Margarine  Gravies Mayonnaise  Sauces:  Cheese, creamed, barbecue, tomato, white Ketchup  Coffee or tea Mustard   SAMPLE MENU:  LEVEL 3 Breakfast Lunch Dinner  Orange juice, 1/2 cup Oatmeal, 1/2 cup Scrambled eggs with cheese, 1/2 cup Decaffeinated tea, 1 cup Whole milk, 1 cup Non-dairy creamer, 2 Tbsp Ketchup, 1 Tbsp Margarine, 1 tsp Salt, 1/4 tsp Sugar, 2 tsp Pineapple juice, 1/2 cup Ground beef, 3 oz Gravy, 2 Tbsp Mashed potatoes, 1/2 cup Cooked spinach, 1/2 cup Applesauce, 1/2 cup Decaffeinated coffee Whole milk Non-dairy creamer, 2 Tbsp Margarine, 1 tsp Salt, 1/4 tsp Pureed turkey, barley soup, 3/4 cup Barbecue chicken, 3 oz Mashed potatoes, 1/2 cup Ground fresh broccoli, 1/2 cup Frozen yogurt, 1/2 cup Decaffeinated tea, 1 cup Non-dairy creamer, 2 Tbsp Margarine, 1 tsp Salt, 1/4 tsp Sugar, 1 tsp    LEVEL 4:  REGULAR FOODS  -Foods in this group are soft, moist, regularly textured foods.   -This level includes meat and breads, which tend to be the hardest things to swallow.   -Eat very slowly, chew well and continue to avoid carbonated drinks. -most people are at this level in 4-6 weeks  Hot Foods Cold Foods  Baked fish or skinned Soft cheeses - cottage cheese  Souffles and omelets Cream cheese  Eggs Yogurt  Stuffed shells Milk  Spaghetti with meat sauce Milkshakes  Cooked cereal Cold dry cereals (no nuts, dried fruit, coconut)  [place] toast or pancakes Crackers  Buttered toast Fruit juices or nectars  Noodles or pasta (no rice) Canned fruit  Potatoes (all types) Ripe bananas  Soft, cooked vegetables (no corn, lima, or baked beans) Peeled, ripe, fresh fruit  Creamed soups or vegetable soup Cakes (no nuts, dried fruit, coconut)  Canned chicken noodle soup Plain doughnuts  Gravies Ice cream  Bacon dressing Pudding, mousse, custard  Sauces:  Cheese, creamed, barbecue, tomato, white Fruit ice, [place] ice, sherbet   Decaffeinated tea or coffee Whipped gelatin  Pork chops Regular gelatin   Canned fruited gelatin molds   Sugar, syrup, honey, jam, jelly   Cream   Non-dairy   Margarine   Oil   Mayonnaise   Ketchup   Mustard   TROUBLESHOOTING IRREGULAR BOWELS  1) Avoid extremes of bowel movements (no bad constipation/diarrhea)  2) Miralax 17gm mixed in 8oz. water or juice-daily. May use BID as needed.  3) Gas-x,Phazyme, etc. as needed for gas & bloating.  4) Soft,bland diet. No spicy,greasy,fried foods.  5) Prilosec over-the-counter  as needed  6) May hold gluten/wheat products from diet to see if symptoms improve.  7) May try probiotics (Align, Activa, etc) to help calm the bowels down  7) If symptoms become worse call back immediately.    If you have any questions please call our office at [facility]: [phone].    ################################################################  LAPAROSCOPIC SURGERY: POST OP INSTRUCTIONS  ######################################################################  EAT Gradually transition to a high fiber diet with a fiber supplement over the next few weeks after discharge.  Start with a pureed / full liquid diet (see below)  WALK Walk an hour a day.  Control your pain to do that.    CONTROL PAIN Control pain so that you can walk, sleep, tolerate sneezing/coughing, go up/down stairs.  HAVE A BOWEL MOVEMENT DAILY Keep your bowels regular to avoid problems.  OK to try a laxative to override constipation.  OK to use an antidairrheal to slow down diarrhea.  Call if not better after 2 tries  CALL IF YOU HAVE PROBLEMS/CONCERNS Call if you are still struggling despite following these instructions. Call if you have concerns not answered by these instructions  ######################################################################    DIET: Follow a light bland diet & liquids the first 24 hours after arrival home, such as soup, liquids,  starches, etc.  Be sure to drink plenty of fluids.  Quickly advance to a usual solid diet within a few days.  Avoid fast food or heavy meals as your are more likely to get nauseated or have irregular bowels.  A low-fat, high-fiber diet for the rest of your life is ideal.  Take your usually prescribed home medications unless otherwise directed. Blood thinners:  You can restart any strong blood thinners after the second postoperative day  for example: COUMADIN (warfarin), XERELTO (rivaroxaban), ELIQUIS  (apixaban ), PLAVIX (clopidigrel), BRILINTA (ticagrelor), EFFIENT (prasugrel), PRADAXA (dabigatran), etc  Continue aspirin before & after surgery..     Some oozing/bleeding the first 1-2 weeks is common but should taper down & be small volume.    If you are passing many large clots or having uncontrolling bleeding, call your surgeon  PAIN CONTROL: Pain is best controlled by a usual combination of three different methods TOGETHER: Ice/Heat Over the counter pain medication Prescription pain medication Most patients will experience some swelling and bruising around the incisions.  Ice packs or heating pads (30-60 minutes up to 6 times a day) will help. Use ice for the first few days to help decrease swelling and bruising, then switch to heat to help relax tight/sore spots and speed recovery.  Some people prefer to use ice alone, heat alone, alternating between ice & heat.  Experiment to what works for you.  Swelling and bruising can take several weeks to resolve.   It is helpful to take an over-the-counter pain medication regularly for the first few weeks.  Choose one of the following that works best for you: Naproxen (Aleve, etc)  Two 220mg  tabs twice a day Ibuprofen (Advil, etc) Three 200mg  tabs four times a day (every meal & bedtime) Acetaminophen  (Tylenol , etc) 500-650mg  four times a day (every meal & bedtime) A  prescription for pain medication (such as oxycodone , hydrocodone , tramadol ,  gabapentin, methocarbamol , etc) should be given to you upon discharge.  Take your pain medication as prescribed.  If you are having problems/concerns with the prescription medicine (does not control pain, nausea, vomiting, rash, itching, etc), please call us  [phone] to see if we need to switch you to a different pain  medicine that will work better for you and/or control your side effect better. If you need a refill on your pain medication, please give us  48 hour notice.  contact your pharmacy.  They will contact our office to request authorization. Prescriptions will not be filled after 5 pm or on week-ends  AVOID GETTING CONSTIPATED.   a.  Between the surgery and the pain medications, it is common to experience some constipation.  b.  Drink plenty of liquids c   ake a fiber supplement 2 times day (such as Metamucil, Citrucel, FiberCon, MiraLax, etc) to have a bowel movement every day. d.  If you have not had a BM by 2 days after surgery: -drink liquids only until you have a bowel movement - take MiraLAX 2 doses every 2 hours until you have a bowel movement   Watch out for diarrhea.   If you have many loose bowel movements, simplify your diet to bland foods & liquids for a few days.   Stop any stool softeners and decrease your fiber supplement.   Switching to mild anti-diarrheal medications (Kayopectate, Pepto Bismol) can help.   If this worsens or does not improve, please call us .  Wash / shower every day.  You may shower over the dressings as they are waterproof.  Continue to shower over incision(s) after the dressing is off.  It is good for closed incisions and even open wounds to be washed every day.  Shower every day.  Short baths are fine.  Wash the incisions and wounds clean with soap & water.    You may leave closed incisions open to air if it is dry.   You may cover the incision with clean gauze & replace it after your daily shower for comfort.  TEGADERM:  You have clear gauze  band-aid dressings over your closed incision(s).  Remove the dressings 2 days after surgery = 5/24.    ACTIVITIES as tolerated:   You may resume regular (light) daily activities beginning the next day--such as daily self-care, walking, climbing stairs--gradually increasing activities as tolerated.  If you can walk 30 minutes without difficulty, it is safe to try more intense activity such as jogging, treadmill, bicycling, low-impact aerobics, swimming, etc. Save the most intensive and strenuous activity for last such as sit-ups, heavy lifting, contact sports, etc  Refrain from any heavy lifting or straining until you are off narcotics for pain control.   DO NOT PUSH THROUGH PAIN.  Let pain be your guide: If it hurts to do something, don't do it.  Pain is your body warning you to avoid that activity for another week until the pain goes down. You may drive when you are no longer taking prescription pain medication, you can comfortably wear a seatbelt, and you can safely maneuver your car and apply brakes. You may have sexual intercourse when it is comfortable.  FOLLOW UP in our office Please call [facility] at [phone] to set up an appointment to see your surgeon in the office for a follow-up appointment approximately 2-3 weeks after your surgery. Make sure that you call for this appointment the day you arrive home to insure a convenient appointment time.  10. IF YOU HAVE DISABILITY OR FAMILY LEAVE FORMS, BRING THEM TO THE OFFICE FOR PROCESSING.  DO NOT GIVE THEM TO YOUR DOCTOR.   WHEN TO CALL US  [phone]: Poor pain control Reactions / problems with new medications (rash/itching, nausea, etc)  Fever over 101.5 F (38.5 C) Inability to urinate  Nausea and/or vomiting Worsening swelling or bruising Continued bleeding from incision. Increased pain, redness, or drainage from the incision   The clinic staff is available to answer your questions during regular business hours (8:30am-5pm).   Please don't hesitate to call and ask to speak to one of our nurses for clinical concerns.   If you have a medical emergency, go to the nearest emergency room or call 911.  A surgeon from [facility] is always on call at the [facility], [place] [address], [address], [place], [place]  [address] ? MAIN: [phone] ? TOLL FREE: [phone] ?  FAX [phone] [email or web address]  ##############################################################

## 2023-07-17 NOTE — Anesthesia Preprocedure Evaluation (Addendum)
 Anesthesia Evaluation  Patient identified by MRN, date of birth, ID band Patient awake    Reviewed: Allergy & Precautions, NPO status , Patient's Chart, lab work & pertinent test results, reviewed documented beta blocker date and time   Airway Mallampati: III  TM Distance: >3 FB Neck ROM: Full    Dental  (+) Missing, Chipped, Poor Dentition   Pulmonary neg pulmonary ROS   Pulmonary exam normal breath sounds clear to auscultation       Cardiovascular hypertension, Pt. on medications and Pt. on home beta blockers Normal cardiovascular exam+ dysrhythmias Atrial Fibrillation  Rhythm:Regular Rate:Normal  TTE 06/2023 1. Left ventricular ejection fraction, by estimation, is 60 to 65%. The  left ventricle has normal function. The left ventricle has no regional  wall motion abnormalities. Left ventricular diastolic function could not  be evaluated. Elevated left  ventricular end-diastolic pressure.   2. Right ventricular systolic function is normal. The right ventricular  size is mildly enlarged. There is moderately elevated pulmonary artery  systolic pressure.   3. Left atrial size was severely dilated.   4. The mitral valve is normal in structure. No evidence of mitral valve  regurgitation. No evidence of mitral stenosis. There is mild prolapse of  posterior leaflet of the mitral valve.   5. The aortic valve is tricuspid. Aortic valve regurgitation is not  visualized. No aortic stenosis is present.   6. The inferior vena cava is dilated in size with >50% respiratory  variability, suggesting right atrial pressure of 8 mmHg.     Neuro/Psych  PSYCHIATRIC DISORDERS Anxiety     negative neurological ROS     GI/Hepatic Neg liver ROS, hiatal hernia, PUD,,,  Endo/Other  negative endocrine ROS    Renal/GU negative Renal ROS  negative genitourinary   Musculoskeletal  (+) Arthritis ,    Abdominal   Peds  Hematology  (+) Blood  dyscrasia (eliquis )   Anesthesia Other Findings   Reproductive/Obstetrics                             Anesthesia Physical Anesthesia Plan  ASA: 3  Anesthesia Plan: General   Post-op Pain Management: Ofirmev  IV (intra-op)*   Induction: Intravenous and Rapid sequence  PONV Risk Score and Plan: Dexamethasone , Ondansetron  and Treatment may vary due to age or medical condition  Airway Management Planned: Oral ETT  Additional Equipment:   Intra-op Plan:   Post-operative Plan: Extubation in OR  Informed Consent: I have reviewed the patients History and Physical, chart, labs and discussed the procedure including the risks, benefits and alternatives for the proposed anesthesia with the patient or authorized representative who has indicated his/her understanding and acceptance.   Patient has DNR.  Discussed DNR with patient and Suspend DNR.   Dental advisory given  Plan Discussed with: CRNA  Anesthesia Plan Comments:        Anesthesia Quick Evaluation

## 2023-07-17 NOTE — Plan of Care (Signed)
  Problem: Health Behavior/Discharge Planning: Goal: Ability to manage health-related needs will improve Outcome: Progressing   Problem: Clinical Measurements: Goal: Ability to maintain clinical measurements within normal limits will improve Outcome: Progressing Goal: Diagnostic test results will improve Outcome: Progressing Goal: Respiratory complications will improve Outcome: Progressing   Problem: Nutrition: Goal: Adequate nutrition will be maintained Outcome: Progressing   Problem: Elimination: Goal: Will not experience complications related to urinary retention Outcome: Progressing   Problem: Pain Managment: Goal: General experience of comfort will improve and/or be controlled Outcome: Progressing   Problem: Skin Integrity: Goal: Risk for impaired skin integrity will decrease Outcome: Progressing

## 2023-07-17 NOTE — Anesthesia Postprocedure Evaluation (Signed)
 Anesthesia Post Note  Patient: Linda Glass  Procedure(s) Performed: REPAIR, HERNIA, PARAESOPHAGEAL, ROBOT-ASSISTED Type II mediastinal dissection. Bilateral crural release with component separation Primary repair of hiatal hernia over pledgets.  Mesh reinforcement with absorbable mesh (BioA) Anterior & posterior gastropexy, fundoplication     Patient location during evaluation: PACU Anesthesia Type: General Level of consciousness: awake and alert Pain management: pain level controlled Vital Signs Assessment: post-procedure vital signs reviewed and stable Respiratory status: spontaneous breathing, nonlabored ventilation, respiratory function stable and patient connected to nasal cannula oxygen Cardiovascular status: blood pressure returned to baseline and stable Postop Assessment: no apparent nausea or vomiting Anesthetic complications: no  No notable events documented.  Last Vitals:  Vitals:   07/17/23 1145 07/17/23 1218  BP: (!) 148/60 (!) 131/56  Pulse: 78 83  Resp: (!) 22 15  Temp:  36.4 C  SpO2: 94% 94%    Last Pain:  Vitals:   07/17/23 1218  TempSrc: Axillary  PainSc:                  [name]

## 2023-07-17 NOTE — Interval H&P Note (Signed)
 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  The various methods of treatment have been discussed with the patient and family. After consideration of risks, benefits and other options for treatment, the patient has consented to  Procedure(s): REPAIR, HERNIA, PARAESOPHAGEAL, ROBOT-ASSISTED (N/A) as a surgical intervention.  The patient's history has been reviewed, patient examined, no change in status, stable for surgery.  I have reviewed the patient's chart and labs.  Questions were answered to the patient's satisfaction.    I have re-reviewed the the patient's records, history, medications, and allergies.  I have re-examined the patient.  I again discussed intraoperative plans and goals of post-operative recovery.  The patient agrees to proceed.  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   Incarcerated hiatal hernia 07/15/2023   Uses roller walker 07/15/2023   Chronic atrial fibrillation (HCC) 07/15/2023   History of COVID-19 07/15/2023   Volvulus of stomach 07/15/2023   Anxiety    Hypomagnesemia 07/02/2023   Elevated LFTs 07/02/2023   High alkaline phosphatase 07/02/2023   Tremor 07/01/2023   Chronic diastolic heart failure (HCC) 10/22/2022    Thrombocytopenia (HCC) 10/22/2022   UTI (urinary tract infection) 10/03/2022   Fall 10/02/2022   Syncope 10/02/2022   Acute UTI (urinary tract infection) 10/02/2022   Hypercoagulable state (HCC) 07/27/2022   Cerumen impaction 10/11/2021   HOH (hard of hearing) 10/11/2021   Difficulty walking 09/21/2021   Long term current use of anticoagulant therapy 09/21/2021   Osteoarthritis 09/21/2021   AKI (acute kidney injury) (HCC) 09/17/2021   Diarrhea 09/17/2021   Hyponatremia 09/17/2021   Metabolic acidosis 09/17/2021   Traumatic compression fracture of T8 vertebra (HCC) 09/17/2021   Breast mass, right 09/17/2021   Ovarian cyst 09/17/2021   Atherosclerosis of aorta (HCC) 01/05/2021   Multiple gastric ulcers    Persistent atrial fibrillation (HCC) 05/01/2020   Hypokalemia 05/01/2020   Hypertension, essential, benign 07/05/2019   Generalized anxiety disorder 07/05/2019   Insomnia disorder 08/21/2016   Class 1 obesity 08/21/2016   Hyperlipidemia 08/21/2016    Past Medical History:  Diagnosis Date   Anemia 04/2020   Anxiety    Arthritis    Chicken pox    COVID-19    Heart murmur    Hyperlipidemia    Hypertension     Past Surgical History:  Procedure Laterality Date   ABDOMINAL HYSTERECTOMY  1983   BIOPSY  05/05/2020   Procedure: BIOPSY;  Surgeon: Sergio Dandy, MD;  Location: [facility];  Service: Endoscopy;;  EGD and COLON   COLONOSCOPY WITH PROPOFOL  N/A 05/05/2020   Procedure: COLONOSCOPY WITH PROPOFOL ;  Surgeon: Sergio Dandy, MD;  Location: [facility];  Service: Endoscopy;  Laterality: N/A;   ESOPHAGOGASTRODUODENOSCOPY (EGD) WITH PROPOFOL  N/A 05/05/2020   Procedure: ESOPHAGOGASTRODUODENOSCOPY (EGD) WITH PROPOFOL ;  Surgeon: Sergio Dandy, MD;  Location: [facility];  Service: Endoscopy;  Laterality: N/A;   FRACTURE SURGERY  wrist fracture 2013   LAPAROSCOPIC PARTIAL COLECTOMY Right 03/12/2021   Procedure: LAPAROSCOPIC HAND ASSISTED RIGHT COLECTOMY;   Surgeon: Junie Olds, MD;  Location: [facility];  Service: General;  Laterality: Right;    Social History   Socioeconomic History   Marital status: Widowed    Spouse name: Not on file   Number of children: 2   Years of education: Not on file   Highest education level: Not on file  Occupational History   Not on file  Tobacco Use   Smoking status: Never   Smokeless tobacco: Never  Vaping Use   Vaping status: Never Used  Substance and Sexual Activity   Alcohol use: Not Currently    Alcohol/week: 7.0 standard drinks of alcohol    Types: 7 Glasses of wine per week    Comment: Occasional   Drug use: No   Sexual activity: Not Currently  Other Topics Concern   Not on file  Social History Narrative   Not on file   Social Drivers of Health   Financial Resource Strain: Low Risk  (08/21/2022)   Overall Financial Resource Strain (CARDIA)    Difficulty of Paying Living Expenses: Not hard at all  Food Insecurity: No Food Insecurity (07/15/2023)   Hunger Vital Sign    Worried About Running Out of Food in the Last Year: Never true    Ran Out of Food in the Last Year: Never true  Transportation Needs: No Transportation Needs (07/15/2023)   PRAPARE - Administrator, Civil Service (Medical): No    Lack of Transportation (Non-Medical): No  Physical Activity: Insufficiently Active (08/21/2022)   Exercise Vital Sign    Days of Exercise per Week: 2 days    Minutes of Exercise per Session: 20 min  Stress: No Stress Concern Present (08/21/2022)   [employer] of Occupational Health - Occupational Stress Questionnaire    Feeling of Stress : Not at all  Social Connections: Moderately Isolated (07/15/2023)   Social Connection and Isolation Panel [NHANES]    Frequency of Communication with Friends and Family: Three times a week    Frequency of Social Gatherings with Friends and Family: Three times a week    Attends Religious Services: More than 4 times per year    Active  Member of Clubs or Organizations: No    Attends Banker Meetings: Patient declined    Marital Status: Widowed  Intimate Partner Violence: Not At Risk (07/15/2023)   Humiliation, Afraid, Rape, and Kick questionnaire    Fear of Current or Ex-Partner: No    Emotionally Abused: No    Physically Abused: No    Sexually Abused: No    Family History  Problem Relation Age of Onset   Hyperlipidemia Mother    Diabetes Neg Hx     Medications Prior to Admission  Medication Sig Dispense Refill Last Dose/Taking   acetaminophen  (TYLENOL ) 325 MG tablet Take 2 tablets (650 mg total) by mouth every 6 (six) hours as needed for mild pain (or Fever >/= 101).   07/09/2023   amLODipine  (NORVASC ) 10 MG tablet TAKE ONE TABLET BY MOUTH DAILY 90 tablet 3 07/15/2023   apixaban  (ELIQUIS ) 5 MG TABS tablet TAKE ONE TABLET BY MOUTH TWICE DAILY 180 tablet 2 07/15/2023 at  8:54 AM   atorvastatin  (LIPITOR) 40 MG tablet TAKE 1 TABLET EACH DAY. (Patient taking differently: Take 40 mg by mouth at bedtime.) 90 tablet 2 07/14/2023   citalopram  (CELEXA ) 20 MG tablet Take  1 tablet (20 mg total) by mouth daily. 30 tablet 0 07/15/2023   Cyanocobalamin  (VITAMIN B 12 PO) Take 500 mcg by mouth daily.   07/15/2023   Ferrous Gluconate  324 (37.5 Fe) MG TABS Take 324 mg by mouth every other day.   Unknown   folic acid  (FOLVITE ) 1 MG tablet Take 1 tablet (1 mg total) by mouth daily. 30 tablet 3 07/15/2023   furosemide  (LASIX ) 80 MG tablet TAKE ONE TABLET BY MOUTH DAILY 90 tablet 3 07/15/2023   LORazepam  (ATIVAN ) 0.5 MG tablet Take 1 tablet (0.5 mg total) by mouth at bedtime. 30 tablet 0 07/14/2023   Magnesium  200 MG TABS Take 1 tablet by mouth daily.   07/15/2023   metoprolol  succinate (TOPROL -XL) 25 MG 24 hr tablet Take 0.5 tablets (12.5 mg total) by mouth daily.   07/15/2023   Multiple Vitamin (MULTIVITAMIN WITH MINERALS) TABS tablet Take 1 tablet by mouth daily.   07/15/2023   ondansetron  (ZOFRAN ) 4 MG tablet Take 1 tablet (4 mg  total) by mouth every 8 (eight) hours as needed for nausea or vomiting. 20 tablet 0 Unknown   pantoprazole  (PROTONIX ) 40 MG tablet TAKE ONE TABLET BY MOUTH ONCE DAILY 90 tablet 2 07/15/2023   thiamine  (VITAMIN B-1) 100 MG tablet Take 1 tablet (100 mg total) by mouth daily. 30 tablet 1 07/15/2023    Current Facility-Administered Medications  Medication Dose Route Frequency Provider Last Rate Last Admin   0.9 %  sodium chloride  infusion   Intravenous Continuous Lonita Roach, MD 150 mL/hr at 07/16/23 2129 New Bag at 07/16/23 2129   [MAR Hold] acetaminophen  (TYLENOL ) tablet 650 mg  650 mg Oral Q6H PRN Lonita Roach, MD       Or   Evette Hoes Hold] acetaminophen  (TYLENOL ) suppository 650 mg  650 mg Rectal Q6H PRN Lonita Roach, MD       acetaminophen  (TYLENOL ) tablet 1,000 mg  1,000 mg Oral Once Woodrum, Chelsey L, MD       [MAR Hold] alum & mag hydroxide-simeth (MAALOX/MYLANTA) 200-200-20 MG/5ML suspension 30 mL  30 mL Oral Q6H PRN Lonita Roach, MD       [facility] Hold] bisacodyl (DULCOLAX) suppository 10 mg  10 mg Rectal Q12H PRN Lonita Roach, MD       bupivacaine  liposome (EXPAREL ) 1.3 % injection 266 mg  20 mL Infiltration Once Candyce Champagne, MD       cefTRIAXone  (ROCEPHIN ) 2 g in sodium chloride  0.9 % 100 mL IVPB  2 g Intravenous On Call to OR Candyce Champagne, MD       chlorhexidine  (PERIDEX ) 0.12 % solution 15 mL  15 mL Mouth/Throat Once Woodrum, Chelsey L, MD       Or   Oral care mouth rinse  15 mL Mouth Rinse Once Woodrum, Chelsey L, MD       dexamethasone  (DECADRON ) injection 4 mg  4 mg Intravenous On Call to OR Candyce Champagne, MD       [facility] Hold] diphenhydrAMINE  (BENADRYL ) injection 12.5-25 mg  12.5-25 mg Intravenous Q6H PRN Lonita Roach, MD       [facility] Hold] folic acid  (FOLVITE ) tablet 1 mg  1 mg Per Tube Daily Lonita Roach, MD   1 mg at 07/15/23 2107   [facility] Hold] hydrALAZINE  (APRESOLINE ) injection 5 mg  5 mg Intravenous Q6H PRN Lonita Roach, MD       [facility]  Hold] HYDROmorphone  (DILAUDID ) injection 0.5-1 mg  0.5-1 mg Intravenous Q2H PRN  Lonita Roach, MD       [facility] Hold] lactated ringers  bolus 1,000 mL  1,000 mL Intravenous Q8H PRN Lonita Roach, MD       lactated ringers  infusion   Intravenous Continuous Grace Laura, MD       [MAR Hold] levalbuterol (XOPENEX) nebulizer solution 1.25 mg  1.25 mg Nebulization Q6H PRN Lonita Roach, MD       [MAR Hold] LORazepam  (ATIVAN ) 2 MG/ML concentrated solution 0.5 mg  0.5 mg Sublingual QHS PRN Goodrich, Daniel P, MD       [MAR Hold] magic mouthwash  15 mL Oral QID PRN Lonita Roach, MD       [facility] Hold] menthol-cetylpyridinium (CEPACOL) lozenge 3 mg  1 lozenge Oral PRN Lonita Roach, MD       [facility] Hold] methocarbamol  (ROBAXIN ) injection 500 mg  500 mg Intravenous Q8H Lonita Roach, MD   500 mg at 07/17/23 0518   [MAR Hold] multivitamin with minerals tablet 1 tablet  1 tablet Oral Daily Lonita Roach, MD       [facility] Hold] naphazoline-glycerin (CLEAR EYES REDNESS) ophth solution 1-2 drop  1-2 drop Both Eyes QID PRN Lonita Roach, MD       [facility] Hold] ondansetron  (ZOFRAN ) injection 4 mg  4 mg Intravenous Q6H PRN Lonita Roach, MD       [MAR Hold] oxyCODONE  (Oxy IR/ROXICODONE ) immediate release tablet 5 mg  5 mg Oral Q4H PRN Lonita Roach, MD       [facility] Hold] pantoprazole  (PROTONIX ) injection 40 mg  40 mg Intravenous Q12H Lonita Roach, MD   40 mg at 07/16/23 2117   [facility] Hold] phenol (CHLORASEPTIC) mouth spray 2 spray  2 spray Mouth/Throat PRN Lonita Roach, MD       [facility] Hold] prochlorperazine  (COMPAZINE ) injection 5-10 mg  5-10 mg Intravenous Q4H PRN Lonita Roach, MD       [facility] Hold] simethicone  (MYLICON) chewable tablet 80 mg  80 mg Oral QID Lonita Roach, MD       [MAR Hold] sodium chloride  (OCEAN) 0.65 % nasal spray 1-2 spray  1-2 spray Each Nare Q6H PRN Lonita Roach, MD       [facility] Hold] thiamine  (VITAMIN B1) tablet 100 mg  100 mg  Oral Daily Lonita Roach, MD       Or   Evette Hoes Hold] thiamine  (VITAMIN B1) injection 100 mg  100 mg Intravenous Daily Lonita Roach, MD   100 mg at 07/16/23 1610     Allergies  Allergen Reactions   Losartan  Other (See Comments)    diarrhea    BP (!) 146/69 (BP Location: Left Arm)   Pulse 68   Temp 97.9 F (36.6 C) (Oral)   Resp 20   SpO2 97%   Labs: Results for orders placed or performed during the hospital encounter of 07/15/23 (from the past 48 hours)  Lipase, blood     Status: None   Collection Time: 07/15/23  1:08 PM  Result Value Ref Range   Lipase 47 11 - 51 U/L    Comment: Performed at [facility], [address], [place], [place] [address]  Comprehensive metabolic panel     Status: Abnormal   Collection Time: 07/15/23  1:08 PM  Result Value Ref Range   Sodium 136 135 - 145 mmol/L   Potassium 2.4 (LL) 3.5 - 5.1 mmol/L    Comment: CRITICAL RESULT  CALLED TO, READ BACK BY AND VERIFIED WITH LEWIS,M. RN AT 1401 07/15/23 MULLINS,T    Chloride 93 (L) 98 - 111 mmol/L   CO2 28 22 - 32 mmol/L   Glucose, Bld 129 (H) 70 - 99 mg/dL    Comment: Glucose reference range applies only to samples taken after fasting for at least 8 hours.   BUN 12 8 - 23 mg/dL   Creatinine, Ser 1.61 0.44 - 1.00 mg/dL   Calcium  9.7 8.9 - 10.3 mg/dL   Total Protein 7.5 6.5 - 8.1 g/dL   Albumin 3.8 3.5 - 5.0 g/dL   AST 32 15 - 41 U/L   ALT 23 0 - 44 U/L   Alkaline Phosphatase 120 38 - 126 U/L   Total Bilirubin 1.0 0.0 - 1.2 mg/dL   GFR, Estimated >09 >60 mL/min    Comment: (NOTE) Calculated using the CKD-EPI Creatinine Equation (2021)    Anion gap 15 5 - 15    Comment: Performed at [facility], [address], [place], [place] [address]  CBC     Status: Abnormal   Collection Time: 07/15/23  1:08 PM  Result Value Ref Range   WBC 8.3 4.0 - 10.5 K/uL   RBC 3.95 3.87 - 5.11 MIL/uL   Hemoglobin 14.0 12.0 - 15.0 g/dL   HCT 81.1 91.4 - 78.2 %   MCV  103.0 (H) 80.0 - 100.0 fL   MCH 35.4 (H) 26.0 - 34.0 pg   MCHC 34.4 30.0 - 36.0 g/dL   RDW 95.6 21.3 - 08.6 %   Platelets 233 150 - 400 K/uL   nRBC 0.0 0.0 - 0.2 %    Comment: Performed at [facility], [address] [place], [place] [address]  Magnesium      Status: Abnormal   Collection Time: 07/15/23  1:08 PM  Result Value Ref Range   Magnesium  1.4 (L) 1.7 - 2.4 mg/dL    Comment: Performed at [facility], [address], [place], [place] [address]  Urinalysis, Routine w reflex microscopic -Urine, Clean Catch     Status: Abnormal   Collection Time: 07/15/23  2:40 PM  Result Value Ref Range   Color, Urine STRAW (A) YELLOW   APPearance CLEAR CLEAR   Specific Gravity, Urine 1.016 1.005 - 1.030   pH 6.0 5.0 - 8.0   Glucose, UA NEGATIVE NEGATIVE mg/dL   Hgb urine dipstick NEGATIVE NEGATIVE   Bilirubin Urine NEGATIVE NEGATIVE   Ketones, ur NEGATIVE NEGATIVE mg/dL   Protein, ur NEGATIVE NEGATIVE mg/dL   Nitrite NEGATIVE NEGATIVE   Leukocytes,Ua NEGATIVE NEGATIVE    Comment: Performed at [facility], [address], [place], [place] [address]  Prealbumin     Status: None   Collection Time: 07/16/23  5:00 AM  Result Value Ref Range   Prealbumin 21 18 - 38 mg/dL    Comment: Performed at [facility], [address], [place], [place] [address]  Vitamin B12     Status: Abnormal   Collection Time: 07/16/23  5:00 AM  Result Value Ref Range   Vitamin B-12 1,408 (H) 180 - 914 pg/mL    Comment: (NOTE) This assay is not validated for testing neonatal or myeloproliferative syndrome specimens for Vitamin B12 levels. Performed at [facility], [address] [place], [place] [address]   Folate     Status: None   Collection Time: 07/16/23  5:00 AM  Result Value Ref  Range   Folate >40.0 >5.9 ng/mL    Comment: RESULT CONFIRMED BY MANUAL DILUTION Performed at [facility], [address] [place], [place] [address]   Iron  and TIBC     Status: None   Collection Time: 07/16/23  5:00 AM  Result Value Ref Range   Iron  77 28 - 170 ug/dL   TIBC 846 962 - 952 ug/dL   Saturation Ratios 24 10.4 - 31.8 %   UIBC 244 ug/dL    Comment: Performed at [facility] [address] [place], [place] [address]  Ferritin     Status: None   Collection Time: 07/16/23  5:00 AM  Result Value Ref Range   Ferritin 107 11 - 307 ng/mL    Comment: Performed at [facility], [address], [place], [place] [address]  Reticulocytes     Status: Abnormal   Collection Time: 07/16/23  5:09 AM  Result Value Ref Range   Retic Ct Pct 1.1 0.4 - 3.1 %   RBC. 3.37 (L) 3.87 - 5.11 MIL/uL   Retic Count, Absolute 37.7 19.0 - 186.0 K/uL   Immature Retic Fract 13.4 2.3 - 15.9 %    Comment: Performed at [facility], [address], [place], [place] [address]  CBC     Status: Abnormal   Collection Time: 07/16/23  5:11 AM  Result Value Ref Range   WBC 6.3 4.0 - 10.5 K/uL   RBC 3.37 (L) 3.87 - 5.11 MIL/uL   Hemoglobin 12.0 12.0 - 15.0 g/dL   HCT 66.4 40.3 - 47.4 %   MCV 108.3 (H) 80.0 - 100.0 fL   MCH 35.6 (H) 26.0 - 34.0 pg   MCHC 32.9 30.0 - 36.0 g/dL   RDW 25.9 56.3 - 87.5 %   Platelets 166 150 - 400 K/uL   nRBC 0.0 0.0 - 0.2 %    Comment: Performed at [facility], [address], [place], [place] [address]  Basic metabolic panel with GFR     Status: Abnormal   Collection Time: 07/16/23  5:11 AM  Result Value Ref Range   Sodium 137 135 - 145 mmol/L   Potassium 2.8 (L) 3.5 - 5.1 mmol/L   Chloride 101 98 - 111 mmol/L   CO2 27 22 - 32 mmol/L   Glucose, Bld 95 70 - 99 mg/dL    Comment: Glucose reference range applies only to samples taken after fasting for at least 8 hours.   BUN 7 (L) 8 - 23 mg/dL   Creatinine, Ser 9.51 (L) 0.44 - 1.00 mg/dL   Calcium  8.6 (L) 8.9 - 10.3 mg/dL   GFR, Estimated >88 >41 mL/min    Comment:  (NOTE) Calculated using the CKD-EPI Creatinine Equation (2021)    Anion gap 9 5 - 15    Comment: Performed at [facility], [address] [place], [place] [address]  Magnesium      Status: None   Collection Time: 07/16/23  5:27 AM  Result Value Ref Range   Magnesium  1.7 1.7 - 2.4 mg/dL    Comment: Performed at [facility], [address] [place], [place] [address]  Phosphorus     Status: None   Collection Time: 07/16/23  5:27 AM  Result Value Ref Range   Phosphorus 3.1 2.5 - 4.6 mg/dL    Comment: Performed at [facility], [address] [place], [place] [address]  CBC  Status: Abnormal   Collection Time: 07/17/23  5:43 AM  Result Value Ref Range   WBC 8.2 4.0 - 10.5 K/uL   RBC 3.46 (L) 3.87 - 5.11 MIL/uL   Hemoglobin 12.3 12.0 - 15.0 g/dL   HCT 54.0 98.1 - 19.1 %   MCV 112.4 (H) 80.0 - 100.0 fL   MCH 35.5 (H) 26.0 - 34.0 pg   MCHC 31.6 30.0 - 36.0 g/dL   RDW 47.8 29.5 - 62.1 %   Platelets 170 150 - 400 K/uL   nRBC 0.0 0.0 - 0.2 %    Comment: Performed at [facility], [address], [place], [place] [address]    Imaging / Studies: ECHOCARDIOGRAM COMPLETE Result Date: 07/16/2023    ECHOCARDIOGRAM REPORT   Patient Name:   ATALIE OROS Garraway Date of Exam: 07/16/2023 Medical Rec #:  [id number]                   Height:       60.0 in Accession #:    [id number]                  Weight:       157.0 lb Date of Birth:  April 13, 1933                   BSA:          1.684 m Patient Age:    89 years                    BP:           140/68 mmHg Patient Gender: F                           HR:           77 bpm. Exam Location:  Inpatient Procedure: 2D Echo, Cardiac Doppler and Color Doppler (Both Spectral and Color            Flow Doppler were utilized during procedure). Indications:    Mitral Valve Disorder I05.9  History:        Patient has prior history of Echocardiogram examinations, most                  recent 10/03/2022. Signs/Symptoms:Murmur; Risk                 Factors:Hypertension.  Sonographer:    Astrid Blamer Referring Phys: [id number] SHENG L HALEY IMPRESSIONS  1. Left ventricular ejection fraction, by estimation, is 60 to 65%. The left ventricle has normal function. The left ventricle has no regional wall motion abnormalities. Left ventricular diastolic function could not be evaluated. Elevated left ventricular end-diastolic pressure.  2. Right ventricular systolic function is normal. The right ventricular size is mildly enlarged. There is moderately elevated pulmonary artery systolic pressure.  3. Left atrial size was severely dilated.  4. The mitral valve is normal in structure. No evidence of mitral valve regurgitation. No evidence of mitral stenosis. There is mild prolapse of posterior leaflet of the mitral valve.  5. The aortic valve is tricuspid. Aortic valve regurgitation is not visualized. No aortic stenosis is present.  6. The inferior vena cava is dilated in size with >50% respiratory variability, suggesting right atrial pressure of 8 mmHg. FINDINGS  Left Ventricle: Left ventricular ejection fraction, by estimation, is 60 to 65%. The left ventricle has normal function. The left ventricle has no  regional wall motion abnormalities. The left ventricular internal cavity size was normal in size. There is  no left ventricular hypertrophy. Left ventricular diastolic function could not be evaluated due to atrial fibrillation. Left ventricular diastolic function could not be evaluated. Elevated left ventricular end-diastolic pressure. Right Ventricle: The right ventricular size is mildly enlarged. No increase in right ventricular wall thickness. Right ventricular systolic function is normal. There is moderately elevated pulmonary artery systolic pressure. The tricuspid regurgitant velocity is 3.11 m/s, and with an assumed right atrial pressure of 8 mmHg, the estimated right ventricular systolic pressure is  46.7 mmHg. Left Atrium: Left atrial size was severely dilated. Right Atrium: Right atrial size was normal in size. Pericardium: There is no evidence of pericardial effusion. Mitral Valve: The mitral valve is normal in structure. There is mild prolapse of posterior leaflet of the mitral valve. Mild mitral annular calcification. No evidence of mitral valve regurgitation. No evidence of mitral valve stenosis. Tricuspid Valve: The tricuspid valve is normal in structure. Tricuspid valve regurgitation is mild . No evidence of tricuspid stenosis. Aortic Valve: The aortic valve is tricuspid. Aortic valve regurgitation is not visualized. No aortic stenosis is present. Aortic valve mean gradient measures 3.0 mmHg. Aortic valve peak gradient measures 6.1 mmHg. Aortic valve area, by VTI measures 2.59 cm. Pulmonic Valve: The pulmonic valve was normal in structure. Pulmonic valve regurgitation is not visualized. No evidence of pulmonic stenosis. Aorta: The aortic root is normal in size and structure. Venous: The inferior vena cava is dilated in size with greater than 50% respiratory variability, suggesting right atrial pressure of 8 mmHg. IAS/Shunts: No atrial level shunt detected by color flow Doppler.  LEFT VENTRICLE PLAX 2D LVIDd:         5.00 cm   Diastology LVIDs:         3.40 cm   LV e' medial:    6.64 cm/s LV PW:         1.00 cm   LV E/e' medial:  23.0 LV IVS:        1.20 cm   LV e' lateral:   10.20 cm/s LVOT diam:     1.90 cm   LV E/e' lateral: 15.0 LV SV:         73 LV SV Index:   43 LVOT Area:     2.84 cm  RIGHT VENTRICLE             IVC RV Basal diam:  4.90 cm     IVC diam: 2.20 cm RV Mid diam:    3.60 cm RV S prime:     12.30 cm/s TAPSE (M-mode): 2.1 cm LEFT ATRIUM              Index        RIGHT ATRIUM           Index LA Vol (A2C):   84.3 ml  50.06 ml/m  RA Area:     27.00 cm LA Vol (A4C):   106.0 ml 62.94 ml/m  RA Volume:   90.90 ml  53.97 ml/m LA Biplane Vol: 96.8 ml  57.48 ml/m  AORTIC VALVE AV Area (Vmax):     2.97 cm AV Area (Vmean):   2.79 cm AV Area (VTI):     2.59 cm AV Vmax:           123.00 cm/s AV Vmean:          86.900 cm/s AV VTI:  0.281 m AV Peak Grad:      6.1 mmHg AV Mean Grad:      3.0 mmHg LVOT Vmax:         129.00 cm/s LVOT Vmean:        85.500 cm/s LVOT VTI:          0.257 m LVOT/AV VTI ratio: 0.91  AORTA Ao Root diam: 2.30 cm MITRAL VALVE                TRICUSPID VALVE MV Area (PHT): 3.44 cm     TR Peak grad:   38.7 mmHg MV E velocity: 153.00 cm/s  TR Vmax:        311.00 cm/s                              SHUNTS                             Systemic VTI:  0.26 m                             Systemic Diam: 1.90 cm Maudine Sos MD Electronically signed by Maudine Sos MD Signature Date/Time: 07/16/2023/4:16:31 PM    Final    DG Abd Portable 1 View Result Date: 07/15/2023 CLINICAL DATA:  Nasogastric tube placement. EXAM: PORTABLE ABDOMEN - 1 VIEW COMPARISON:  None Available. FINDINGS: Nasogastric tube tip is seen in proximal stomach. Distal side hole is still in distal esophagus. IMPRESSION: Nasogastric tube tip is seen in proximal stomach with distal side hole in esophagus; advancement is recommended. Electronically Signed   By: Rosalene Colon M.D.   On: 07/15/2023 17:44   DG Chest Port 1 View Result Date: 07/15/2023 CLINICAL DATA:  Cough. EXAM: PORTABLE CHEST 1 VIEW COMPARISON:  October 02, 2022. FINDINGS: Stable cardiomegaly. Nasogastric tube tip seen in proximal stomach. Large hiatal hernia is noted. Minimal left basilar subsegmental atelectasis. Right lung is clear. IMPRESSION: Large hiatal hernia.  Minimal left basilar subsegmental atelectasis. Electronically Signed   By: Rosalene Colon M.D.   On: 07/15/2023 17:43   CT ABDOMEN PELVIS W CONTRAST Addendum Date: 07/15/2023 ADDENDUM REPORT: 07/15/2023 16:00 ADDENDUM: These results were called by telephone at the time of interpretation on 07/15/2023 at 3:59 pm to provider Dr. Lind Repine, who verbally acknowledged these  results. Electronically Signed   By: Reagan Camera M.D.   On: 07/15/2023 16:00   Result Date: 07/15/2023 CLINICAL DATA:  Left-sided upper abdominal pain, vomiting EXAM: CT ABDOMEN AND PELVIS WITH CONTRAST TECHNIQUE: Multidetector CT imaging of the abdomen and pelvis was performed using the standard protocol following bolus administration of intravenous contrast. RADIATION DOSE REDUCTION: This exam was performed according to the departmental dose-optimization program which includes automated exposure control, adjustment of the mA and/or kV according to patient size and/or use of iterative reconstruction technique. CONTRAST:  OMNIPAQUE  IOHEXOL  300 MG/ML  SOLN COMPARISON:  CT of the abdomen and pelvis performed September 17, 2021. Abdominal ultrasound performed Jul 02, 2023 FINDINGS: Lower chest: A moderate size hiatal hernia is present. The stomach is rotated in the gastric outlet is in the lower mediastinum. The second and third portions of the duodenum are in anatomic location. When compared to CT scan performed September 17, 2021, there has been an increase in the portion of the stomach which is in the  lower chest as well as the degree of gastric rotation. There is enhancement of the gastric wall visualized throughout. The imaged portion of the distal esophagus is not dilated. Hepatobiliary: Not significantly changed. Pancreas: Unchanged Spleen: Within normal limits. Adrenals/Urinary Tract: Motion related changes are present in both kidneys. Small left adrenal nodule, similar. A cyst is present in the lower pole left kidney which measures 1.3 cm. Stomach/Bowel: Hiatal hernia with rotated stomach is detailed above. The small bowel is nondilated. Colonic diverticular changes are present Vascular/Lymphatic: No significant vascular findings are present. No enlarged abdominal or pelvic lymph nodes. Reproductive: Status post hysterectomy. No adnexal masses. A cystic structure is present in the low pelvis which was seen on  the previous exam. This measures approximately 6.0 by 4.3 cm, not significantly changed. Other: No abdominal wall hernia or abnormality. No abdominopelvic ascites. Musculoskeletal: Degenerative changes are present in the imaged osseous structures. Compression deformity of T12, similar when compared to the previous exam. IMPRESSION: 1. Abnormal appearance of the stomach with herniation into the lower chest and with imaging features of gastric rotation which can be observed in the setting of gastric volvulus. Based on imaging, organo-axial rotation is favored. There is diffuse symmetric enhancement of the stomach. 2. The previously observed cystic structure in the pelvis is not significantly changed. Outpatient follow-up ultrasound should be considered. 3. Diverticulosis without evidence of diverticulitis. Electronically Signed: By: Reagan Camera M.D. On: 07/15/2023 15:40   CT HEAD WO CONTRAST ( ) Result Date: 07/02/2023 CLINICAL DATA:  Mental status change, unknown cause Sudden onset of shaking/tremulous EXAM: CT HEAD WITHOUT CONTRAST TECHNIQUE: Contiguous axial images were obtained from the base of the skull through the vertex without intravenous contrast. RADIATION DOSE REDUCTION: This exam was performed according to the departmental dose-optimization program which includes automated exposure control, adjustment of the mA and/or kV according to patient size and/or use of iterative reconstruction technique. COMPARISON:  CT head 10/02/2022. FINDINGS: Brain: No evidence of acute infarction, hemorrhage, hydrocephalus, extra-axial collection or mass lesion/mass effect. Vascular: No hyperdense vessel.  Calcific atherosclerosis. Skull: No acute fracture. Sinuses/Orbits: Clear sinuses.  No acute orbital findings. Other: No mastoid effusions. IMPRESSION: No evidence of acute intracranial abnormality. Electronically Signed   By: Stevenson Elbe M.D.   On: 07/02/2023 22:21   US  Abdomen Limited RUQ (LIVER/GB) Result  Date: 07/02/2023 CLINICAL DATA:  Elevated LFTs EXAM: ULTRASOUND ABDOMEN LIMITED RIGHT UPPER QUADRANT COMPARISON:  CT abdomen and pelvis September 17, 2021 FINDINGS: Gallbladder: Multiple gallstones correlate with chronic cholelithiasis Common bile duct: Diameter: 7.3 mm, normal for patient's age Liver: Echogenic heterogeneous slightly nodular appearing liver could correlate with hepatocellular disease portal vein is patent on color Doppler imaging with normal direction of blood flow towards the liver. Other: None. IMPRESSION: *Chronic cholelithiasis. *Heterogeneous slightly nodular appearing liver could correlate with hepatocellular disease. Electronically Signed   By: Fredrich Jefferson M.D.   On: 07/02/2023 08:54     .Eddye Goodie, M.D., F.A.C.S. Gastrointestinal and Minimally Invasive Surgery [facility] [address], [address] [place], [place] [address] [phone] Main / Paging  07/17/2023 6:57 AM    Eddye Goodie

## 2023-07-17 NOTE — Anesthesia Procedure Notes (Signed)
 Procedure Name: Intubation Date/Time: 07/17/2023 7:58 AM  Performed by: Delona Ferron, CRNAPre-anesthesia Checklist: Patient identified, Emergency Drugs available, Suction available, Patient being monitored and Timeout performed Patient Re-evaluated:Patient Re-evaluated prior to induction Oxygen Delivery Method: Circle System Utilized Preoxygenation: Pre-oxygenation with 100% oxygen Induction Type: IV induction, Rapid sequence and Cricoid Pressure applied Laryngoscope Size: Miller and 3 Grade View: Grade II Tube type: Oral Tube size: 7.0 mm Number of attempts: 1 Airway Equipment and Method: Stylet and Oral airway Placement Confirmation: ETT inserted through vocal cords under direct vision, positive ETCO2 and breath sounds checked- equal and bilateral Secured at: 21 cm Tube secured with: Tape Dental Injury: Teeth and Oropharynx as per pre-operative assessment

## 2023-07-17 NOTE — Transfer of Care (Signed)
 Immediate Anesthesia Transfer of Care Note  Patient: Linda Glass  Procedure(s) Performed: REPAIR, HERNIA, PARAESOPHAGEAL, ROBOT-ASSISTED  Patient Location: PACU  Anesthesia Type:General  Level of Consciousness: drowsy and patient cooperative  Airway & Oxygen Therapy: Patient Spontanous Breathing  Post-op Assessment: Report given to RN and Post -op Vital signs reviewed and stable  Post vital signs: Reviewed and stable  Last Vitals:  Vitals Value Taken Time  BP 127/75 07/17/23 1054  Temp    Pulse 76 07/17/23 1057  Resp 19 07/17/23 1057  SpO2 94 % 07/17/23 1057  Vitals shown include unfiled device data.  Last Pain:  Vitals:   07/17/23 0702  TempSrc: Oral  PainSc: 0-No pain         Complications: No notable events documented.

## 2023-07-17 NOTE — Progress Notes (Signed)
  Progress Note   Patient: Linda Glass NFA:[id number] DOB: 1933/04/21 DOA: 07/15/2023     2 DOS: the patient was seen and examined on 07/17/2023   Brief hospital course: 89 year old woman PMH including atrial fibrillation on apixaban  presented with abdominal pain nausea and vomiting.  Imaging showed gastric volvulus.  NG tube placed, seen by general surgery with plans for operative intervention.  Seen by cardiology for cardiac clearance.  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   Status post surgery 5/22.  Management per general surgery.   Permanent atrial fibrillation Can resume apixaban  tomorrow per general surgery Continue telemetry, if stable, can likely discontinue tomorrow   Hypokalemia  Replete   Hypertension  Hyperlipidemia Monitor   Anxiety Resume Celexa  when able Ativan  at night   Pelvic cystic structure Unchanged on CT.  Consider outpatient ultrasound follow-up.       Subjective:  Feels ok post-op  Physical Exam: Vitals:   07/17/23 1145 07/17/23 1218 07/17/23 1423 07/17/23 1537  BP: (!) 148/60 (!) 131/56 (!) 127/58 (!) 134/51  Pulse: 78 83 69 77  Resp: (!) 22 15 18    Temp:  97.6 F (36.4 C) (!) 97.5 F (36.4 C) 97.6 F (36.4 C)  TempSrc:  Axillary Oral Oral  SpO2: 94% 94% 94% 93%  Weight:      Height:       Physical Exam Vitals reviewed.  Constitutional:      General: She is not in acute distress.    Appearance: She is not ill-appearing or toxic-appearing.  Cardiovascular:     Rate and Rhythm: Normal rate and regular rhythm.     Heart sounds: No murmur heard. Pulmonary:     Effort: Pulmonary effort is normal. No respiratory distress.     Breath sounds: No wheezing, rhonchi or rales.  Neurological:     Mental Status: She is alert.  Psychiatric:        Mood and Affect: Mood normal.        Behavior: Behavior  normal.     Data Reviewed: Potassium 3.4, remainder BMP unremarkable CBC stable, glucose modestly low at 63  Family Communication: none  Disposition: Status is: Inpatient Remains inpatient appropriate because: s/p surgery     Time spent: 20 minutes  Author: Jerline Moon, MD 07/17/2023 5:56 PM  For on call review [email or web address].

## 2023-07-17 NOTE — Progress Notes (Signed)
 PHARMACY - ANTICOAGULATION CONSULT NOTE  Pharmacy Consult for heparin Indication: atrial fibrillation  Allergies  Allergen Reactions   Losartan  Other (See Comments)    diarrhea    Patient Measurements: Heparin dosing weight: 60 kg  Vital Signs: Temp: 97.6 F (36.4 C) (05/22 1218) Temp Source: Axillary (05/22 1218) BP: 131/56 (05/22 1218) Pulse Rate: 83 (05/22 1218)  Labs: Recent Labs    07/15/23 1308 07/16/23 0511 07/17/23 0543  HGB 14.0 12.0 12.3  HCT 40.7 36.5 38.9  PLT 233 166 170  CREATININE 0.53 0.32* 0.49    Estimated Creatinine Clearance: 43.6 mL/min (by C-G formula based on SCr of 0.49 mg/dL).   Medical History: Past Medical History:  Diagnosis Date   Anemia 04/2020   Anxiety    Arthritis    Chicken pox    COVID-19    Heart murmur    Hyperlipidemia    Hypertension     Medications:  Eliquis  5 mg BID - last dose 5/20 am  Assessment: 89 year old female with history of afib on Eliquis , last dose morning of 5/20. Patient presented with abdominal pain and vomiting. Found to have gastric volvulus, general surgery consulted. Eliquis  remains on hold. Pharmacy consulted to manage heparin infusion.  S/p hiatal hernia repair today.  Goal of Therapy:  Heparin level 0.3-0.7 units/ml aPTT 66-102 seconds Monitor platelets by anticoagulation protocol: Yes   Plan:  -Per consult from surgeon, ok to resume heparin vs Eliquis  tomorrow morning 5/23 -Will discontinue heparin consult at this time -Plan to follow up with primary team tomorrow morning regarding anticoagulation  Lolita Rise, PharmD, BCPS Clinical Pharmacist 07/17/2023 12:44 PM

## 2023-07-17 NOTE — Plan of Care (Signed)

## 2023-07-18 ENCOUNTER — Inpatient Hospital Stay (HOSPITAL_COMMUNITY)

## 2023-07-18 ENCOUNTER — Encounter (HOSPITAL_COMMUNITY): Payer: Self-pay | Admitting: Surgery

## 2023-07-18 DIAGNOSIS — Z7901 Long term (current) use of anticoagulants: Secondary | ICD-10-CM

## 2023-07-18 DIAGNOSIS — I5032 Chronic diastolic (congestive) heart failure: Secondary | ICD-10-CM | POA: Diagnosis not present

## 2023-07-18 DIAGNOSIS — K3189 Other diseases of stomach and duodenum: Secondary | ICD-10-CM | POA: Diagnosis not present

## 2023-07-18 DIAGNOSIS — I4821 Permanent atrial fibrillation: Secondary | ICD-10-CM | POA: Diagnosis not present

## 2023-07-18 LAB — PHOSPHORUS: Phosphorus: 3.8 mg/dL (ref 2.5–4.6)

## 2023-07-18 LAB — CBC
HCT: 33.5 % — ABNORMAL LOW (ref 36.0–46.0)
Hemoglobin: 11 g/dL — ABNORMAL LOW (ref 12.0–15.0)
MCH: 35.9 pg — ABNORMAL HIGH (ref 26.0–34.0)
MCHC: 32.8 g/dL (ref 30.0–36.0)
MCV: 109.5 fL — ABNORMAL HIGH (ref 80.0–100.0)
Platelets: 169 10*3/uL (ref 150–400)
RBC: 3.06 MIL/uL — ABNORMAL LOW (ref 3.87–5.11)
RDW: 12.6 % (ref 11.5–15.5)
WBC: 12.7 10*3/uL — ABNORMAL HIGH (ref 4.0–10.5)
nRBC: 0 % (ref 0.0–0.2)

## 2023-07-18 LAB — AMYLASE: Amylase: 388 U/L — ABNORMAL HIGH (ref 28–100)

## 2023-07-18 LAB — BASIC METABOLIC PANEL WITH GFR
Anion gap: 8 (ref 5–15)
BUN: 10 mg/dL (ref 8–23)
CO2: 25 mmol/L (ref 22–32)
Calcium: 9.2 mg/dL (ref 8.9–10.3)
Chloride: 102 mmol/L (ref 98–111)
Creatinine, Ser: 0.57 mg/dL (ref 0.44–1.00)
GFR, Estimated: >60 mL/min (ref >60)
Glucose, Bld: 121 mg/dL — ABNORMAL HIGH (ref 70–99)
Potassium: 3.7 mmol/L (ref 3.5–5.1)
Sodium: 135 mmol/L (ref 135–145)

## 2023-07-18 LAB — MAGNESIUM: Magnesium: 1.6 mg/dL — ABNORMAL LOW (ref 1.7–2.4)

## 2023-07-18 MED ORDER — FUROSEMIDE 10 MG/ML IJ SOLN
40.0000 mg | Freq: Once | INTRAMUSCULAR | Status: AC
  Administered 2023-07-18: 40 mg via INTRAVENOUS
  Filled 2023-07-18: qty 4

## 2023-07-18 MED ORDER — MAGNESIUM SULFATE 2 GM/50ML IV SOLN
2.0000 g | Freq: Once | INTRAVENOUS | Status: AC
  Administered 2023-07-18: 2 g via INTRAVENOUS
  Filled 2023-07-18: qty 50

## 2023-07-18 MED ORDER — IOHEXOL 300 MG/ML  SOLN
100.0000 mL | Freq: Once | INTRAMUSCULAR | Status: AC | PRN
  Administered 2023-07-18: 100 mL via ORAL

## 2023-07-18 MED ORDER — SODIUM CHLORIDE 0.9% FLUSH: 3.0000 mL | INTRAVENOUS | Status: DC | PRN

## 2023-07-18 MED ORDER — DEXAMETHASONE SODIUM PHOSPHATE 10 MG/ML IJ SOLN
8.0000 mg | Freq: Two times a day (BID) | INTRAMUSCULAR | Status: AC
  Administered 2023-07-18 – 2023-07-20 (×6): 8 mg via INTRAVENOUS
  Filled 2023-07-18 (×6): qty 1

## 2023-07-18 MED ORDER — METOPROLOL SUCCINATE ER 25 MG PO TB24
12.5000 mg | ORAL_TABLET | Freq: Every day | ORAL | Status: DC
  Administered 2023-07-18 – 2023-07-20 (×3): 12.5 mg via ORAL
  Filled 2023-07-18 (×4): qty 1

## 2023-07-18 MED ORDER — SODIUM CHLORIDE 0.9 % IV SOLN: 250.0000 mL | INTRAVENOUS | Status: DC | PRN

## 2023-07-18 MED ORDER — SODIUM CHLORIDE 0.9% FLUSH
3.0000 mL | Freq: Two times a day (BID) | INTRAVENOUS | Status: DC
  Administered 2023-07-18 – 2023-07-21 (×7): 3 mL via INTRAVENOUS

## 2023-07-18 MED ORDER — ACETAMINOPHEN 160 MG/5ML PO SOLN
960.0000 mg | Freq: Four times a day (QID) | ORAL | Status: DC
Start: 2023-07-18 — End: 2023-07-21
  Administered 2023-07-18 – 2023-07-21 (×11): 960 mg via ORAL
  Filled 2023-07-18 (×11): qty 40.6

## 2023-07-18 MED ORDER — CITALOPRAM HYDROBROMIDE 20 MG PO TABS
20.0000 mg | ORAL_TABLET | Freq: Every day | ORAL | Status: DC
Start: 2023-07-18 — End: 2023-07-21
  Administered 2023-07-18 – 2023-07-21 (×4): 20 mg via ORAL
  Filled 2023-07-18 (×4): qty 1

## 2023-07-18 MED ORDER — BOOST / RESOURCE BREEZE PO LIQD CUSTOM
1.0000 | Freq: Three times a day (TID) | ORAL | Status: DC
  Administered 2023-07-18 – 2023-07-19 (×2): 1 via ORAL

## 2023-07-18 NOTE — Plan of Care (Signed)

## 2023-07-18 NOTE — Progress Notes (Signed)
 Progress Note  1 Day Post-Op  Subjective: Patient reports overall feeling well but would prefer to remain in hospital today for monitoring post-op.   Objective: Vital signs in last 24 hours: Temp:  [97.5 F (36.4 C)-98.4 F (36.9 C)] 97.5 F (36.4 C) (05/23 0520) Pulse Rate:  [66-83] 72 (05/23 0520) Resp:  [15-22] 18 (05/23 0520) BP: (127-155)/(51-86) 155/86 (05/23 0520) SpO2:  [90 %-95 %] 90 % (05/23 0520) Last BM Date :  (unable to assess/ pt unable to answer)  Intake/Output from previous day: 05/22 0701 - 05/23 0700 In: 2536 [P.O.:836; I.V.:1700] Out: 980 [Urine:610; Drains:370] Intake/Output this shift: Total I/O In: 240 [P.O.:240] Out: 60 [Drains:60]  PE: General: pleasant, WD, elderly female who is laying in bed in NAD Lungs:  Respiratory effort nonlabored Abd: soft, appropriately ttp, mild distention, incisions C/D/I, drain with SS fluid Psych: A&Ox3 with an appropriate affect.    Lab Results:  Recent Labs    07/17/23 0543 07/18/23 0513  WBC 8.2 12.7*  HGB 12.3 11.0*  HCT 38.9 33.5*  PLT 170 169   BMET Recent Labs    07/17/23 0543 07/18/23 0513  NA 136 135  K 3.4* 3.7  CL 104 102  CO2 20* 25  GLUCOSE 63* 121*  BUN 9 10  CREATININE 0.49 0.57  CALCIUM  8.8* 9.2   PT/INR No results for input(s): "LABPROT", "INR" in the last 72 hours. CMP     Component Value Date/Time   NA 135 07/18/2023 0513   NA 138 10/15/2021 1243   K 3.7 07/18/2023 0513   CL 102 07/18/2023 0513   CO2 25 07/18/2023 0513   GLUCOSE 121 (H) 07/18/2023 0513   BUN 10 07/18/2023 0513   BUN 15 10/15/2021 1243   CREATININE 0.57 07/18/2023 0513   CREATININE 0.53 (L) 02/11/2020 1103   CALCIUM  9.2 07/18/2023 0513   PROT 7.5 07/15/2023 1308   ALBUMIN 3.8 07/15/2023 1308   AST 32 07/15/2023 1308   ALT 23 07/15/2023 1308   ALKPHOS 120 07/15/2023 1308   BILITOT 1.0 07/15/2023 1308   GFRNONAA >60 07/18/2023 0513   GFRNONAA 87 02/11/2020 1103   GFRAA 101 02/11/2020 1103    Lipase     Component Value Date/Time   LIPASE 47 07/15/2023 1308       Studies/Results: DG ESOPHAGUS W SINGLE CM (SOL OR THIN BA) Result Date: 07/18/2023 CLINICAL DATA:  Provided history: Status post laparoscopic fundoplication. Additional history obtained from electronic MEDICAL RECORD NUMBERStatus post robotic reduction of paraesophageal hiatal hernia, mediastinal dissection, crural release with component separation, repair of hiatal hernia over pledgets, mesh reinforcement, anterior and posterior gastropexy, Toupet fundoplication. EXAM: ESOPHOGRAM/BARIUM SWALLOW TECHNIQUE: A single contrast examination was performed using water soluble contrast. FLUOROSCOPY: Radiation Exposure Index (as provided by the fluoroscopic device): 32.90 mGy Kerma COMPARISON:  CT abdomen/pelvis 07/15/2023. FINDINGS: A problem-oriented water-soluble contrast esophagram was performed to assess for post-operative leak or obstruction. Narrowed appearance of the distal esophagus/gastroesophageal junction consistent with the provided history of fundoplication. Mildly delayed passage of contrast from the distal esophagus into the stomach, possibly due to post-operative edema. No extraluminal contrast demonstrated to suggest a post-operative leak. IMPRESSION: 1. No evidence of a post-operative leak. 2. Mildly delayed passage of contrast from the distal esophagus into the stomach, possibly due to post-operative edema. Electronically Signed   By: Bascom Lily D.O.   On: 07/18/2023 09:27   ECHOCARDIOGRAM COMPLETE Result Date: 07/16/2023    ECHOCARDIOGRAM REPORT   Patient Name:   Linda  SCHILLER Glass Date of Exam: 07/16/2023 Medical Rec #:  [id number]                   Height:       60.0 in Accession #:    [id number]                  Weight:       157.0 lb Date of Birth:  1933/10/11                   BSA:          1.684 m Patient Age:    89 years                    BP:           140/68 mmHg Patient Gender: F                            HR:           77 bpm. Exam Location:  Inpatient Procedure: 2D Echo, Cardiac Doppler and Color Doppler (Both Spectral and Color            Flow Doppler were utilized during procedure). Indications:    Mitral Valve Disorder I05.9  History:        Patient has prior history of Echocardiogram examinations, most                 recent 10/03/2022. Signs/Symptoms:Murmur; Risk                 Factors:Hypertension.  Sonographer:    Astrid Blamer Referring Phys: [id number] SHENG L HALEY IMPRESSIONS  1. Left ventricular ejection fraction, by estimation, is 60 to 65%. The left ventricle has normal function. The left ventricle has no regional wall motion abnormalities. Left ventricular diastolic function could not be evaluated. Elevated left ventricular end-diastolic pressure.  2. Right ventricular systolic function is normal. The right ventricular size is mildly enlarged. There is moderately elevated pulmonary artery systolic pressure.  3. Left atrial size was severely dilated.  4. The mitral valve is normal in structure. No evidence of mitral valve regurgitation. No evidence of mitral stenosis. There is mild prolapse of posterior leaflet of the mitral valve.  5. The aortic valve is tricuspid. Aortic valve regurgitation is not visualized. No aortic stenosis is present.  6. The inferior vena cava is dilated in size with >50% respiratory variability, suggesting right atrial pressure of 8 mmHg. FINDINGS  Left Ventricle: Left ventricular ejection fraction, by estimation, is 60 to 65%. The left ventricle has normal function. The left ventricle has no regional wall motion abnormalities. The left ventricular internal cavity size was normal in size. There is  no left ventricular hypertrophy. Left ventricular diastolic function could not be evaluated due to atrial fibrillation. Left ventricular diastolic function could not be evaluated. Elevated left ventricular end-diastolic pressure. Right Ventricle: The right ventricular size is mildly  enlarged. No increase in right ventricular wall thickness. Right ventricular systolic function is normal. There is moderately elevated pulmonary artery systolic pressure. The tricuspid regurgitant velocity is 3.11 m/s, and with an assumed right atrial pressure of 8 mmHg, the estimated right ventricular systolic pressure is 46.7 mmHg. Left Atrium: Left atrial size was severely dilated. Right Atrium: Right atrial size was normal in size. Pericardium: There is no evidence of pericardial effusion. Mitral Valve: The mitral valve is normal in structure. There  is mild prolapse of posterior leaflet of the mitral valve. Mild mitral annular calcification. No evidence of mitral valve regurgitation. No evidence of mitral valve stenosis. Tricuspid Valve: The tricuspid valve is normal in structure. Tricuspid valve regurgitation is mild . No evidence of tricuspid stenosis. Aortic Valve: The aortic valve is tricuspid. Aortic valve regurgitation is not visualized. No aortic stenosis is present. Aortic valve mean gradient measures 3.0 mmHg. Aortic valve peak gradient measures 6.1 mmHg. Aortic valve area, by VTI measures 2.59 cm. Pulmonic Valve: The pulmonic valve was normal in structure. Pulmonic valve regurgitation is not visualized. No evidence of pulmonic stenosis. Aorta: The aortic root is normal in size and structure. Venous: The inferior vena cava is dilated in size with greater than 50% respiratory variability, suggesting right atrial pressure of 8 mmHg. IAS/Shunts: No atrial level shunt detected by color flow Doppler.  LEFT VENTRICLE PLAX 2D LVIDd:         5.00 cm   Diastology LVIDs:         3.40 cm   LV e' medial:    6.64 cm/s LV PW:         1.00 cm   LV E/e' medial:  23.0 LV IVS:        1.20 cm   LV e' lateral:   10.20 cm/s LVOT diam:     1.90 cm   LV E/e' lateral: 15.0 LV SV:         73 LV SV Index:   43 LVOT Area:     2.84 cm  RIGHT VENTRICLE             IVC RV Basal diam:  4.90 cm     IVC diam: 2.20 cm RV Mid diam:     3.60 cm RV S prime:     12.30 cm/s TAPSE (M-mode): 2.1 cm LEFT ATRIUM              Index        RIGHT ATRIUM           Index LA Vol (A2C):   84.3 ml  50.06 ml/m  RA Area:     27.00 cm LA Vol (A4C):   106.0 ml 62.94 ml/m  RA Volume:   90.90 ml  53.97 ml/m LA Biplane Vol: 96.8 ml  57.48 ml/m  AORTIC VALVE AV Area (Vmax):    2.97 cm AV Area (Vmean):   2.79 cm AV Area (VTI):     2.59 cm AV Vmax:           123.00 cm/s AV Vmean:          86.900 cm/s AV VTI:            0.281 m AV Peak Grad:      6.1 mmHg AV Mean Grad:      3.0 mmHg LVOT Vmax:         129.00 cm/s LVOT Vmean:        85.500 cm/s LVOT VTI:          0.257 m LVOT/AV VTI ratio: 0.91  AORTA Ao Root diam: 2.30 cm MITRAL VALVE                TRICUSPID VALVE MV Area (PHT): 3.44 cm     TR Peak grad:   38.7 mmHg MV E velocity: 153.00 cm/s  TR Vmax:        311.00 cm/s  SHUNTS                             Systemic VTI:  0.26 m                             Systemic Diam: 1.90 cm Maudine Sos MD Electronically signed by Maudine Sos MD Signature Date/Time: 07/16/2023/4:16:31 PM    Final     Anti-infectives: Anti-infectives (From admission, onward)    Start     Dose/Rate Route Frequency Ordered Stop   07/17/23 0740  sodium chloride  0.9 % with cefTRIAXone  (ROCEPHIN ) ADS Med       Note to Pharmacy: [name]: cabinet override      07/17/23 0740 07/17/23 0846   07/17/23 0600  cefTRIAXone  (ROCEPHIN ) 2 g in sodium chloride  0.9 % 100 mL IVPB        2 g 200 mL/hr over 30 Minutes Intravenous On call to O.R. 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 Hershell Lose - UGI with passage of contrast to stomach - will give some steroids while here but does not need to continue on DC - ok to advance to D1 diet, dietitian consult for education on this prior to DC - continue drain today but likely can remove prior to DC -  reasonable to monitor today but discharge over the weekend if doing well, will get follow up and instructions in AVS  FEN: D1 diet, SLIV VTE: Eliquis  resumed today ID: rocephin  pre-op  LOS: 3 days     Linda Glass, [facility] 07/18/2023, 11:24 AM Please see Amion for pager number during day hours 7:00am-4:30pm

## 2023-07-18 NOTE — Progress Notes (Signed)
 Rounding Note    Patient Name: Linda Glass Date of Encounter: 07/18/2023  [facility] Cardiologist: Sheryle Donning, MD   Subjective   BP 155/86. Cr 0.57, K 3.7, Mag 1.6. Hgb 11.0.  Denies any chest pain or dyspnea  Inpatient Medications    Scheduled Meds:  acetaminophen  (TYLENOL ) oral liquid 160 mg/5 mL  960 mg Oral Q6H   apixaban   5 mg Oral BID   folic acid   1 mg Per Tube Daily   methocarbamol  (ROBAXIN ) injection  500 mg Intravenous Q8H   multivitamin with minerals  1 tablet Oral Daily   pantoprazole  (PROTONIX ) IV  40 mg Intravenous Q12H   simethicone   80 mg Oral QID   sodium chloride  flush  3 mL Intravenous Q12H   thiamine   100 mg Oral Daily   Or   thiamine   100 mg Intravenous Daily   Continuous Infusions:  sodium chloride      lactated ringers      PRN Meds: sodium chloride , alum & mag hydroxide-simeth, bisacodyl, diphenhydrAMINE , hydrALAZINE , HYDROmorphone  (DILAUDID ) injection, lactated ringers , levalbuterol, LORazepam , magic mouthwash, menthol-cetylpyridinium, methocarbamol  (ROBAXIN ) injection, naphazoline-glycerin, ondansetron  (ZOFRAN ) IV, oxyCODONE , phenol, prochlorperazine , sodium chloride , sodium chloride  flush   Vital Signs    Vitals:   07/17/23 1423 07/17/23 1537 07/17/23 1926 07/18/23 0520  BP: (!) 127/58 (!) 134/51 (!) 127/54 (!) 155/86  Pulse: 69 77 66 72  Resp: 18  18 18   Temp: (!) 97.5 F (36.4 C) 97.6 F (36.4 C) 98.2 F (36.8 C) (!) 97.5 F (36.4 C)  TempSrc: Oral Oral    SpO2: 94% 93% 94% 90%  Weight:      Height:        Intake/Output Summary (Last 24 hours) at 07/18/2023 1115 Last data filed at 07/18/2023 1011 Gross per 24 hour  Intake 1076 ml  Output 610 ml  Net 466 ml      07/17/2023    7:02 AM 07/01/2023   12:14 PM 10/22/2022   10:17 AM  Last 3 Weights  Weight (lbs) 157 lb 157 lb 160 lb 2 oz  Weight (kg) 71.215 kg 71.215 kg 72.632 kg      Telemetry    Afib, rate controlled - Personally  Reviewed  ECG    No new ECG - Personally Reviewed  Physical Exam   GEN: No acute distress.   Neck: No JVD Cardiac: Irregular, normal rate, no murmurs, rubs, or gallops.  Respiratory: Clear to auscultation bilaterally. GI: Soft, nontender, non-distended  MS: No edema; No deformity. Neuro:  Nonfocal  Psych: Normal affect   Labs    High Sensitivity Troponin:  No results for input(s): "TROPONINIHS" in the last 720 hours.   Chemistry Recent Labs  Lab 07/15/23 1308 07/16/23 0511 07/16/23 0527 07/17/23 0543 07/18/23 0513  NA 136 137  --  136 135  K 2.4* 2.8*  --  3.4* 3.7  CL 93* 101  --  104 102  CO2 28 27  --  20* 25  GLUCOSE 129* 95  --  63* 121*  BUN 12 7*  --  9 10  CREATININE 0.53 0.32*  --  0.49 0.57  CALCIUM  9.7 8.6*  --  8.8* 9.2  MG 1.4*  --  1.7 1.8 1.6*  PROT 7.5  --   --   --   --   ALBUMIN 3.8  --   --   --   --   AST 32  --   --   --   --  ALT 23  --   --   --   --   ALKPHOS 120  --   --   --   --   BILITOT 1.0  --   --   --   --   GFRNONAA >60 >60  --  >60 >60  ANIONGAP 15 9  --  12 8    Lipids No results for input(s): "CHOL", "TRIG", "HDL", "LABVLDL", "LDLCALC", "CHOLHDL" in the last 168 hours.  Hematology Recent Labs  Lab 07/16/23 0511 07/17/23 0543 07/18/23 0513  WBC 6.3 8.2 12.7*  RBC 3.37* 3.46* 3.06*  HGB 12.0 12.3 11.0*  HCT 36.5 38.9 33.5*  MCV 108.3* 112.4* 109.5*  MCH 35.6* 35.5* 35.9*  MCHC 32.9 31.6 32.8  RDW 12.8 12.8 12.6  PLT 166 170 169   Thyroid  No results for input(s): "TSH", "FREET4" in the last 168 hours.  BNPNo results for input(s): "BNP", "PROBNP" in the last 168 hours.  DDimer No results for input(s): "DDIMER" in the last 168 hours.   Radiology    DG ESOPHAGUS W SINGLE CM (SOL OR THIN BA) Result Date: 07/18/2023 CLINICAL DATA:  Provided history: Status post laparoscopic fundoplication. Additional history obtained from electronic MEDICAL RECORD NUMBERStatus post robotic reduction of paraesophageal hiatal hernia,  mediastinal dissection, crural release with component separation, repair of hiatal hernia over pledgets, mesh reinforcement, anterior and posterior gastropexy, Toupet fundoplication. EXAM: ESOPHOGRAM/BARIUM SWALLOW TECHNIQUE: A single contrast examination was performed using water soluble contrast. FLUOROSCOPY: Radiation Exposure Index (as provided by the fluoroscopic device): 32.90 mGy Kerma COMPARISON:  CT abdomen/pelvis 07/15/2023. FINDINGS: A problem-oriented water-soluble contrast esophagram was performed to assess for post-operative leak or obstruction. Narrowed appearance of the distal esophagus/gastroesophageal junction consistent with the provided history of fundoplication. Mildly delayed passage of contrast from the distal esophagus into the stomach, possibly due to post-operative edema. No extraluminal contrast demonstrated to suggest a post-operative leak. IMPRESSION: 1. No evidence of a post-operative leak. 2. Mildly delayed passage of contrast from the distal esophagus into the stomach, possibly due to post-operative edema. Electronically Signed   By: Bascom Lily D.O.   On: 07/18/2023 09:27   ECHOCARDIOGRAM COMPLETE Result Date: 07/16/2023    ECHOCARDIOGRAM REPORT   Patient Name:   Linda Glass Date of Exam: 07/16/2023 Medical Rec #:  [id number]                   Height:       60.0 in Accession #:    [id number]                  Weight:       157.0 lb Date of Birth:  [birth year]                   BSA:          1.684 m Patient Age:    89 years                    BP:           140/68 mmHg Patient Gender: F                           HR:           77 bpm. Exam Location:  Inpatient Procedure: 2D Echo, Cardiac Doppler and Color Doppler (Both Spectral and Color            Flow  Doppler were utilized during procedure). Indications:    Mitral Valve Disorder I05.9  History:        Patient has prior history of Echocardiogram examinations, most                 recent 10/03/2022. Signs/Symptoms:Murmur;  Risk                 Factors:Hypertension.  Sonographer:    Astrid Blamer Referring Phys: [id number] SHENG L HALEY IMPRESSIONS  1. Left ventricular ejection fraction, by estimation, is 60 to 65%. The left ventricle has normal function. The left ventricle has no regional wall motion abnormalities. Left ventricular diastolic function could not be evaluated. Elevated left ventricular end-diastolic pressure.  2. Right ventricular systolic function is normal. The right ventricular size is mildly enlarged. There is moderately elevated pulmonary artery systolic pressure.  3. Left atrial size was severely dilated.  4. The mitral valve is normal in structure. No evidence of mitral valve regurgitation. No evidence of mitral stenosis. There is mild prolapse of posterior leaflet of the mitral valve.  5. The aortic valve is tricuspid. Aortic valve regurgitation is not visualized. No aortic stenosis is present.  6. The inferior vena cava is dilated in size with >50% respiratory variability, suggesting right atrial pressure of 8 mmHg. FINDINGS  Left Ventricle: Left ventricular ejection fraction, by estimation, is 60 to 65%. The left ventricle has normal function. The left ventricle has no regional wall motion abnormalities. The left ventricular internal cavity size was normal in size. There is  no left ventricular hypertrophy. Left ventricular diastolic function could not be evaluated due to atrial fibrillation. Left ventricular diastolic function could not be evaluated. Elevated left ventricular end-diastolic pressure. Right Ventricle: The right ventricular size is mildly enlarged. No increase in right ventricular wall thickness. Right ventricular systolic function is normal. There is moderately elevated pulmonary artery systolic pressure. The tricuspid regurgitant velocity is 3.11 m/s, and with an assumed right atrial pressure of 8 mmHg, the estimated right ventricular systolic pressure is 46.7 mmHg. Left Atrium: Left atrial size  was severely dilated. Right Atrium: Right atrial size was normal in size. Pericardium: There is no evidence of pericardial effusion. Mitral Valve: The mitral valve is normal in structure. There is mild prolapse of posterior leaflet of the mitral valve. Mild mitral annular calcification. No evidence of mitral valve regurgitation. No evidence of mitral valve stenosis. Tricuspid Valve: The tricuspid valve is normal in structure. Tricuspid valve regurgitation is mild . No evidence of tricuspid stenosis. Aortic Valve: The aortic valve is tricuspid. Aortic valve regurgitation is not visualized. No aortic stenosis is present. Aortic valve mean gradient measures 3.0 mmHg. Aortic valve peak gradient measures 6.1 mmHg. Aortic valve area, by VTI measures 2.59 cm. Pulmonic Valve: The pulmonic valve was normal in structure. Pulmonic valve regurgitation is not visualized. No evidence of pulmonic stenosis. Aorta: The aortic root is normal in size and structure. Venous: The inferior vena cava is dilated in size with greater than 50% respiratory variability, suggesting right atrial pressure of 8 mmHg. IAS/Shunts: No atrial level shunt detected by color flow Doppler.  LEFT VENTRICLE PLAX 2D LVIDd:         5.00 cm   Diastology LVIDs:         3.40 cm   LV e' medial:    6.64 cm/s LV PW:         1.00 cm   LV E/e' medial:  23.0 LV IVS:  1.20 cm   LV e' lateral:   10.20 cm/s LVOT diam:     1.90 cm   LV E/e' lateral: 15.0 LV SV:         73 LV SV Index:   43 LVOT Area:     2.84 cm  RIGHT VENTRICLE             IVC RV Basal diam:  4.90 cm     IVC diam: 2.20 cm RV Mid diam:    3.60 cm RV S prime:     12.30 cm/s TAPSE (M-mode): 2.1 cm LEFT ATRIUM              Index        RIGHT ATRIUM           Index LA Vol (A2C):   84.3 ml  50.06 ml/m  RA Area:     27.00 cm LA Vol (A4C):   106.0 ml 62.94 ml/m  RA Volume:   90.90 ml  53.97 ml/m LA Biplane Vol: 96.8 ml  57.48 ml/m  AORTIC VALVE AV Area (Vmax):    2.97 cm AV Area (Vmean):   2.79 cm  AV Area (VTI):     2.59 cm AV Vmax:           123.00 cm/s AV Vmean:          86.900 cm/s AV VTI:            0.281 m AV Peak Grad:      6.1 mmHg AV Mean Grad:      3.0 mmHg LVOT Vmax:         129.00 cm/s LVOT Vmean:        85.500 cm/s LVOT VTI:          0.257 m LVOT/AV VTI ratio: 0.91  AORTA Ao Root diam: 2.30 cm MITRAL VALVE                TRICUSPID VALVE MV Area (PHT): 3.44 cm     TR Peak grad:   38.7 mmHg MV E velocity: 153.00 cm/s  TR Vmax:        311.00 cm/s                              SHUNTS                             Systemic VTI:  0.26 m                             Systemic Diam: 1.90 cm Maudine Sos MD Electronically signed by Maudine Sos MD Signature Date/Time: 07/16/2023/4:16:31 PM    Final     Cardiac Studies     Patient Profile     89 y.o. female with a hx of permanent atrial fibrillation, mitral valve regurgitation/prolapse hypertension, hyperlipidemia, chronic HFpEF who is being seen 07/16/2023 for the evaluation of preoperative evaluation   Assessment & Plan      Permanent atrial fibrillation Asymptomatic, rate controlled with heart rates in the 60s to 70s. Okay to restart Eliquis  today per surgery Okay to continue to hold beta-blocker, heart rates have been controlled off beta-blocker   Chronic HFpEF Looks euvolemic, appears well compensated.  Echocardiogram this admission with EF 60 to 65%, severe left atrial enlargement, mild mitral regurgitation.  Closely monitor  volume status and IV fluids.   Mitral valve regurgitation Likely secondary to prolapse of the posterior leaflet with primary MR.  She has mild to moderate MR.  Last echocardiogram 09/2022.  Echocardiogram this admission shows mild mitral digitation  History of syncope This was in the setting of UTI with no recurrences.  Heart monitor ordered but never completed.  With clear underlying cause do not feel this requires further workup at this time.  [facility] will sign off.   Medication  Recommendations:  No changes Other recommendations (labs, testing, etc): None Follow up as an outpatient: Will schedule follow-up   For questions or updates, please contact [facility] Please consult [email or web address] for contact info under        Signed, Wendie Hamburg, MD  07/18/2023, 11:15 AM

## 2023-07-18 NOTE — Progress Notes (Signed)
  Progress Note   Patient: Linda Glass WUJ:[id number] DOB: Jan 30, 1934 DOA: 07/15/2023     3 DOS: the patient was seen and examined on 07/18/2023   Brief hospital course: 89 year old woman PMH including atrial fibrillation on apixaban  presented with abdominal pain nausea and vomiting.  Imaging showed gastric volvulus.  NG tube placed, seen by general surgery with plans for operative intervention.  Seen by cardiology for cardiac clearance.  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   Status post surgery 5/22.  Management per general surgery.   Permanent atrial fibrillation Can resume apixaban  per general surgery Discontinue telemetry   Hypokalemia  Repleted   Hypertension  Hyperlipidemia Resume carvedilol   Anxiety Resume Celexa   Ativan  at night   Pelvic cystic structure Unchanged on CT.  Consider outpatient ultrasound follow-up.      Subjective:  Feels ok  Physical Exam: Vitals:   07/17/23 1423 07/17/23 1537 07/17/23 1926 07/18/23 0520  BP: (!) 127/58 (!) 134/51 (!) 127/54 (!) 155/86  Pulse: 69 77 66 72  Resp: 18  18 18   Temp: (!) 97.5 F (36.4 C) 97.6 F (36.4 C) 98.2 F (36.8 C) (!) 97.5 F (36.4 C)  TempSrc: Oral Oral    SpO2: 94% 93% 94% 90%  Weight:      Height:       Physical Exam Vitals reviewed.  Constitutional:      General: She is not in acute distress.    Appearance: She is not ill-appearing or toxic-appearing.  Cardiovascular:     Rate and Rhythm: Normal rate and regular rhythm.     Heart sounds: No murmur heard. Pulmonary:     Effort: Pulmonary effort is normal. No respiratory distress.     Breath sounds: No wheezing, rhonchi or rales.  Neurological:     Mental Status: She is alert.  Psychiatric:        Mood and Affect: Mood normal.        Behavior: Behavior normal.     Data Reviewed: BMP noted Mg2+  1.6 CBC noted  Family Communication: none  Disposition: Status is: Inpatient Remains inpatient appropriate because: s/p surgery     Time spent: 20 minutes  Author: Jerline Moon, MD 07/18/2023 8:46 AM  For on call review [email or web address].

## 2023-07-18 NOTE — Progress Notes (Signed)
 Nutrition Note  RD consulted for nutrition education regarding diet instructions s/p robotic hiatal hernia repair. Surgery recommending pureed diet for 2-3 weeks post-op.   RD provided "IDDSI Level 4 Pureed Marrie Sizer) Nutrition Therapy" handout from the [employer] of [employer]. Reviewed patient's dietary recall. Provided examples on ways to modify foods on pureed diet. Encouraged pt to blend foods using a blender or food processor and strain to obtain desired consistency (no lumps). Also encouraged use of commercial pureed foods and oral nutritional supplements such as Ensure to provide adequate calories and protein. Discussed how to modify recipes to add calories and protein.    RD discussed why it is important for patient to adhere to diet recommendations and discussed possibility of diet advancement upon MD follow-up. Teach back method used.   Expect good compliance. Pt quite HOH during education but stated she understood the diet. Pt agreeable to protein shakes as well.   Body mass index is 32.78 kg/m. Pt meets criteria for obesity based on current BMI.   Current diet order is clear liquids. Labs and medications reviewed. No further nutrition interventions warranted at this time.  If additional nutrition issues arise, please re-consult RD.

## 2023-07-19 DIAGNOSIS — E66811 Obesity, class 1: Secondary | ICD-10-CM

## 2023-07-19 DIAGNOSIS — E876 Hypokalemia: Secondary | ICD-10-CM | POA: Diagnosis not present

## 2023-07-19 DIAGNOSIS — I10 Essential (primary) hypertension: Secondary | ICD-10-CM

## 2023-07-19 DIAGNOSIS — K440 Diaphragmatic hernia with obstruction, without gangrene: Secondary | ICD-10-CM

## 2023-07-19 DIAGNOSIS — K3189 Other diseases of stomach and duodenum: Secondary | ICD-10-CM | POA: Diagnosis not present

## 2023-07-19 DIAGNOSIS — I482 Chronic atrial fibrillation, unspecified: Secondary | ICD-10-CM | POA: Diagnosis not present

## 2023-07-19 LAB — CBC
HCT: 34.8 % — ABNORMAL LOW (ref 36.0–46.0)
Hemoglobin: 11.6 g/dL — ABNORMAL LOW (ref 12.0–15.0)
MCH: 35.7 pg — ABNORMAL HIGH (ref 26.0–34.0)
MCHC: 33.3 g/dL (ref 30.0–36.0)
MCV: 107.1 fL — ABNORMAL HIGH (ref 80.0–100.0)
Platelets: 177 10*3/uL (ref 150–400)
RBC: 3.25 MIL/uL — ABNORMAL LOW (ref 3.87–5.11)
RDW: 12.6 % (ref 11.5–15.5)
WBC: 9.4 10*3/uL (ref 4.0–10.5)
nRBC: 0 % (ref 0.0–0.2)

## 2023-07-19 LAB — BASIC METABOLIC PANEL WITH GFR
Anion gap: 8 (ref 5–15)
BUN: 10 mg/dL (ref 8–23)
CO2: 24 mmol/L (ref 22–32)
Calcium: 9.4 mg/dL (ref 8.9–10.3)
Chloride: 101 mmol/L (ref 98–111)
Creatinine, Ser: 0.54 mg/dL (ref 0.44–1.00)
GFR, Estimated: >60 mL/min (ref >60)
Glucose, Bld: 142 mg/dL — ABNORMAL HIGH (ref 70–99)
Potassium: 3.7 mmol/L (ref 3.5–5.1)
Sodium: 133 mmol/L — ABNORMAL LOW (ref 135–145)

## 2023-07-19 LAB — MAGNESIUM: Magnesium: 1.6 mg/dL — ABNORMAL LOW (ref 1.7–2.4)

## 2023-07-19 MED ORDER — AMLODIPINE BESYLATE 10 MG PO TABS
10.0000 mg | ORAL_TABLET | Freq: Every day | ORAL | Status: DC
  Administered 2023-07-19 – 2023-07-21 (×3): 10 mg via ORAL
  Filled 2023-07-19 (×3): qty 1

## 2023-07-19 MED ORDER — POLYETHYLENE GLYCOL 3350 17 GM/SCOOP PO POWD
17.0000 g | Freq: Two times a day (BID) | ORAL | 1 refills | Status: DC | PRN
Start: 2023-07-19 — End: 2024-01-08

## 2023-07-19 NOTE — Plan of Care (Signed)
  Problem: Clinical Measurements: Goal: Ability to maintain clinical measurements within normal limits will improve Outcome: Progressing Goal: Diagnostic test results will improve Outcome: Progressing Goal: Respiratory complications will improve Outcome: Progressing Goal: Cardiovascular complication will be avoided Outcome: Progressing   Problem: Activity: Goal: Risk for activity intolerance will decrease Outcome: Progressing   Problem: Coping: Goal: Level of anxiety will decrease Outcome: Progressing   Problem: Pain Managment: Goal: General experience of comfort will improve and/or be controlled Outcome: Progressing   Problem: Safety: Goal: Ability to remain free from injury will improve Outcome: Progressing   Problem: Skin Integrity: Goal: Risk for impaired skin integrity will decrease Outcome: Progressing

## 2023-07-19 NOTE — Plan of Care (Signed)

## 2023-07-19 NOTE — Progress Notes (Signed)
 Mobility Specialist - Progress Note   07/19/23 1139  Mobility  Activity Ambulated with assistance in hallway  Level of Assistance Standby assist, set-up cues, supervision of patient - no hands on  Assistive Device Front wheel walker  Distance Ambulated (ft) 275 ft  Activity Response Tolerated well  Mobility Referral Yes  Mobility visit 1 Mobility  Mobility Specialist Start Time (ACUTE ONLY) 1131  Mobility Specialist Stop Time (ACUTE ONLY) 1139  Mobility Specialist Time Calculation (min) (ACUTE ONLY) 8 min   Pt received in recliner and agreeable to mobility. No complaints during session. Pt to recliner after session with all needs met.     [facility]

## 2023-07-19 NOTE — Progress Notes (Signed)
 2 Days Post-Op   Subjective/Chief Complaint: Doing well Tolerating diet    Objective: Vital signs in last 24 hours: Temp:  [97.5 F (36.4 C)-98 F (36.7 C)] 98 F (36.7 C) (05/24 0517) Pulse Rate:  [66-75] 75 (05/24 0517) Resp:  [15-18] 18 (05/24 0517) BP: (138-149)/(75-78) 138/78 (05/24 0517) SpO2:  [94 %-98 %] 98 % (05/24 0517) Last BM Date : 07/18/23  Intake/Output from previous day: 05/23 0701 - 05/24 0700 In: 940 [P.O.:840; I.V.:100] Out: 460 [Urine:200; Drains:260] Intake/Output this shift: No intake/output data recorded.  Port sites CDI sore abdomen   Lab Results:  Recent Labs    07/18/23 0513 07/19/23 0645  WBC 12.7* 9.4  HGB 11.0* 11.6*  HCT 33.5* 34.8*  PLT 169 177   BMET Recent Labs    07/18/23 0513 07/19/23 0645  NA 135 133*  K 3.7 3.7  CL 102 101  CO2 25 24  GLUCOSE 121* 142*  BUN 10 10  CREATININE 0.57 0.54  CALCIUM  9.2 9.4   PT/INR No results for input(s): "LABPROT", "INR" in the last 72 hours. ABG No results for input(s): "PHART", "HCO3" in the last 72 hours.  Invalid input(s): "PCO2", "PO2"  Studies/Results: DG ESOPHAGUS W SINGLE CM (SOL OR THIN BA) Result Date: 07/18/2023 CLINICAL DATA:  Provided history: Status post laparoscopic fundoplication. Additional history obtained from electronic MEDICAL RECORD NUMBERStatus post robotic reduction of paraesophageal hiatal hernia, mediastinal dissection, crural release with component separation, repair of hiatal hernia over pledgets, mesh reinforcement, anterior and posterior gastropexy, Toupet fundoplication. EXAM: ESOPHOGRAM/BARIUM SWALLOW TECHNIQUE: A single contrast examination was performed using water soluble contrast. FLUOROSCOPY: Radiation Exposure Index (as provided by the fluoroscopic device): 32.90 mGy Kerma COMPARISON:  CT abdomen/pelvis 07/15/2023. FINDINGS: A problem-oriented water-soluble contrast esophagram was performed to assess for post-operative leak or obstruction. Narrowed  appearance of the distal esophagus/gastroesophageal junction consistent with the provided history of fundoplication. Mildly delayed passage of contrast from the distal esophagus into the stomach, possibly due to post-operative edema. No extraluminal contrast demonstrated to suggest a post-operative leak. IMPRESSION: 1. No evidence of a post-operative leak. 2. Mildly delayed passage of contrast from the distal esophagus into the stomach, possibly due to post-operative edema. Electronically Signed   By: Bascom Lily D.O.   On: 07/18/2023 09:27    Anti-infectives: Anti-infectives (From admission, onward)    Start     Dose/Rate Route Frequency Ordered Stop   07/17/23 0740  sodium chloride  0.9 % with cefTRIAXone  (ROCEPHIN ) ADS Med       Note to Pharmacy: [name]: cabinet override      07/17/23 0740 07/17/23 0846   07/17/23 0600  cefTRIAXone  (ROCEPHIN ) 2 g in sodium chloride  0.9 % 100 mL IVPB        2 g 200 mL/hr over 30 Minutes Intravenous On call to O.R. 07/16/23 0901 07/17/23 1223       Assessment/Plan: s/p Procedure(s): REPAIR, HERNIA, PARAESOPHAGEAL, ROBOT-ASSISTED Type II mediastinal dissection. Bilateral crural release with component separation Primary repair of hiatal hernia over pledgets.  Mesh reinforcement with absorbable mesh (BioA) Anterior & posterior gastropexy, fundoplication (N/A) Can be d/c when medically ready Pureed diet for 6 weeks  Follow up instructions in the chart   LOS: 4 days    Andy Bannister A Aletha Allebach 07/19/2023

## 2023-07-19 NOTE — Progress Notes (Addendum)
 Spoke with son-in-law  about patient being possibly discharged tomorrow.. he has concerns about possibly arranging home health for this pt, due to pt not being able to do adls by herself. He is wanting clarification on how the JP drain will be managed and taken care of at home. They also have concerns about bruising in her abdominal area where the incision is, 80ml of serosang out in 12 hour shift.

## 2023-07-19 NOTE — Progress Notes (Signed)
  Progress Note   Patient: Linda Glass ZOX:[id number] DOB: May 15, 1933 DOA: 07/15/2023     4 DOS: the patient was seen and examined on 07/19/2023   Brief hospital course: 89 year old woman PMH including atrial fibrillation on apixaban  presented with abdominal pain nausea and vomiting.  Imaging showed gastric volvulus.  NG tube placed, seen by general surgery with plans for operative intervention.  Seen by cardiology for cardiac clearance.  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   Status post surgery 5/22.  Has JP drain.  Management per general surgery.   Permanent atrial fibrillation: Rate controlled Can resume apixaban  per general surgery Optimize electrolytes   Hypertension: BP slightly elevated Continue home Toprol -XL Resume home amlodipine  Continue holding Lasix   Hyperlipidemia  Continue home statin  Anxiety: Anxious to go home.  Lives alone. Continue home Celexa  Ativan  at night   Pelvic cystic structure Unchanged on CT.   Consider outpatient ultrasound follow-up.  Hypokalemia  Repleted  Hyponatremia: Mild Continue monitoring    Subjective:  Feels okay.  She is very anxious to go home and told her family is in town to help.  She lives alone at home.  Denies pain, nausea, vomiting, chest pain or shortness of breath.  Physical Exam: Vitals:   07/18/23 1200 07/18/23 1910 07/19/23 0517 07/19/23 1348  BP: 138/75 (!) 149/77 138/78 (!) 150/80  Pulse: 66 71 75 (!) 58  Resp: 18 15 18 19   Temp: 98 F (36.7 C) (!) 97.5 F (36.4 C) 98 F (36.7 C) 97.7 F (36.5 C)  TempSrc:   Oral Oral  SpO2: 94% 96% 98% 97%  Weight:      Height:       GENERAL: No apparent distress.  Nontoxic. HEENT: MMM.  Vision and hearing grossly intact.  NECK: Supple.  No apparent JVD.  RESP:  No IWOB.  Fair aeration bilaterally. CVS: Irregular rhythm.   Normal rate.  Heart sounds normal.  ABD/GI/GU: BS+. Abd soft, NTND.  JP drain with serosanguineous fluid MSK/EXT:   No apparent deformity. Moves extremities. No edema.  SKIN: no apparent skin lesion or wound NEURO: Awake and alert. Oriented appropriately.  No apparent focal neuro deficit. PSYCH: Calm. Normal affect.   Data Reviewed: BMP, CBC, vitals and medications  Family Communication: Updated son-in-law later  Disposition: Status is: Inpatient Remains inpatient appropriate because: s/p surgery     Time spent: 35 minutes  Author: Theadore Finger, MD 07/19/2023 3:43 PM  For on call review [email or web address].

## 2023-07-20 DIAGNOSIS — I5032 Chronic diastolic (congestive) heart failure: Secondary | ICD-10-CM

## 2023-07-20 DIAGNOSIS — K3189 Other diseases of stomach and duodenum: Secondary | ICD-10-CM | POA: Diagnosis not present

## 2023-07-20 DIAGNOSIS — I4821 Permanent atrial fibrillation: Secondary | ICD-10-CM | POA: Diagnosis not present

## 2023-07-20 DIAGNOSIS — I482 Chronic atrial fibrillation, unspecified: Secondary | ICD-10-CM | POA: Diagnosis not present

## 2023-07-20 LAB — RENAL FUNCTION PANEL
Albumin: 2.8 g/dL — ABNORMAL LOW (ref 3.5–5.0)
Anion gap: 9 (ref 5–15)
BUN: 11 mg/dL (ref 8–23)
CO2: 24 mmol/L (ref 22–32)
Calcium: 9.3 mg/dL (ref 8.9–10.3)
Chloride: 101 mmol/L (ref 98–111)
Creatinine, Ser: 0.39 mg/dL — ABNORMAL LOW (ref 0.44–1.00)
GFR, Estimated: >60 mL/min (ref >60)
Glucose, Bld: 129 mg/dL — ABNORMAL HIGH (ref 70–99)
Phosphorus: 2.6 mg/dL (ref 2.5–4.6)
Potassium: 3.6 mmol/L (ref 3.5–5.1)
Sodium: 134 mmol/L — ABNORMAL LOW (ref 135–145)

## 2023-07-20 LAB — CBC
HCT: 35.4 % — ABNORMAL LOW (ref 36.0–46.0)
Hemoglobin: 12.1 g/dL (ref 12.0–15.0)
MCH: 36.2 pg — ABNORMAL HIGH (ref 26.0–34.0)
MCHC: 34.2 g/dL (ref 30.0–36.0)
MCV: 106 fL — ABNORMAL HIGH (ref 80.0–100.0)
Platelets: 167 10*3/uL (ref 150–400)
RBC: 3.34 MIL/uL — ABNORMAL LOW (ref 3.87–5.11)
RDW: 12.5 % (ref 11.5–15.5)
WBC: 7.3 10*3/uL (ref 4.0–10.5)
nRBC: 0 % (ref 0.0–0.2)

## 2023-07-20 LAB — MAGNESIUM: Magnesium: 1.6 mg/dL — ABNORMAL LOW (ref 1.7–2.4)

## 2023-07-20 MED ORDER — MAGNESIUM SULFATE 2 GM/50ML IV SOLN
2.0000 g | Freq: Once | INTRAVENOUS | Status: AC
  Administered 2023-07-20: 2 g via INTRAVENOUS
  Filled 2023-07-20: qty 50

## 2023-07-20 NOTE — Plan of Care (Signed)
  Problem: Education: Goal: Knowledge of General Education information will improve Description: Including pain rating scale, medication(s)/side effects and non-pharmacologic comfort measures 07/20/2023 0803 by Kerwin Peels, RN Outcome: Adequate for Discharge 07/20/2023 0753 by Kerwin Peels, RN Outcome: Progressing   Problem: Health Behavior/Discharge Planning: Goal: Ability to manage health-related needs will improve 07/20/2023 0803 by Kerwin Peels, RN Outcome: Adequate for Discharge 07/20/2023 0753 by Kerwin Peels, RN Outcome: Progressing   Problem: Clinical Measurements: Goal: Ability to maintain clinical measurements within normal limits will improve 07/20/2023 0803 by Kerwin Peels, RN Outcome: Adequate for Discharge 07/20/2023 0753 by Kerwin Peels, RN Outcome: Progressing Goal: Will remain free from infection 07/20/2023 0803 by Kerwin Peels, RN Outcome: Adequate for Discharge 07/20/2023 0753 by Kerwin Peels, RN Outcome: Progressing Goal: Diagnostic test results will improve Outcome: Adequate for Discharge Goal: Respiratory complications will improve Outcome: Adequate for Discharge Goal: Cardiovascular complication will be avoided Outcome: Adequate for Discharge   Problem: Activity: Goal: Risk for activity intolerance will decrease Outcome: Adequate for Discharge   Problem: Nutrition: Goal: Adequate nutrition will be maintained Outcome: Adequate for Discharge   Problem: Coping: Goal: Level of anxiety will decrease Outcome: Adequate for Discharge   Problem: Elimination: Goal: Will not experience complications related to bowel motility Outcome: Adequate for Discharge Goal: Will not experience complications related to urinary retention Outcome: Adequate for Discharge   Problem: Pain Managment: Goal: General experience of comfort will improve and/or be controlled Outcome: Adequate for Discharge    Problem: Safety: Goal: Ability to remain free from injury will improve Outcome: Adequate for Discharge   Problem: Skin Integrity: Goal: Risk for impaired skin integrity will decrease Outcome: Adequate for Discharge

## 2023-07-20 NOTE — Progress Notes (Signed)
  Progress Note   Patient: Linda Glass ZOX:[id number] DOB: 1933/04/11 DOA: 07/15/2023     5 DOS: the patient was seen and examined on 07/20/2023   Brief hospital course: 89 year old woman PMH including atrial fibrillation on apixaban  presented with abdominal pain nausea and vomiting.  Imaging showed gastric volvulus.  NG tube placed, seen by general surgery with plans for operative intervention.  Seen by cardiology for cardiac clearance.  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   Status post surgery 5/22.  Management per general surgery. Pureed diet for 6 weeks  JP stays in- instructions in the chart Dressings can be removed in 24 hours    Permanent atrial fibrillation Mild mitral valve regurgitation Continue apixaban   Echocardiogram showed LVEF 60 to 65%, severe left atrial enlargement   Hypokalemia  Repleted   Hypertension  Hyperlipidemia Continue carvedilol   Anxiety Continue Celexa   Ativan  at night   Pelvic cystic structure Unchanged on CT.  Consider outpatient ultrasound follow-up.  Overall improved.  PT and OT assessments.  Anticipate discharge home likely tomorrow.    Subjective:  Feels ok  Physical Exam: Vitals:   07/19/23 0517 07/19/23 1348 07/19/23 1938 07/20/23 0630  BP: 138/78 (!) 150/80 (!) 154/84 (!) 153/83  Pulse: 75 (!) 58 (!) 59 64  Resp: 18 19 15 16   Temp: 98 F (36.7 C) 97.7 F (36.5 C) 98.1 F (36.7 C) 98.2 F (36.8 C)  TempSrc: Oral Oral  Oral  SpO2: 98% 97% 92% 99%  Weight:      Height:       Physical Exam Vitals reviewed.  Constitutional:      General: She is not in acute distress.    Appearance: She is not ill-appearing or toxic-appearing.  Cardiovascular:     Rate and Rhythm: Normal rate and regular rhythm.     Heart sounds: No murmur heard. Pulmonary:     Effort: Pulmonary effort is normal.  No respiratory distress.     Breath sounds: No wheezing, rhonchi or rales.  Neurological:     Mental Status: She is alert.  Psychiatric:        Mood and Affect: Mood normal.        Behavior: Behavior normal.     Data Reviewed: Sodium 134, remainder BMP noted. Magnesium  1.6, replete. CBC noted.  Family Communication:   Disposition: Status is: Inpatient Remains inpatient appropriate because: s/p surgery     Time spent: 20 minutes  Author: Jerline Moon, MD 07/20/2023 10:51 AM  For on call review [email or web address].

## 2023-07-20 NOTE — Progress Notes (Signed)
 3 Days Post-Op   Subjective/Chief Complaint: Tolerating pureed diet  Minimal pain   Objective: Vital signs in last 24 hours: Temp:  [97.7 F (36.5 C)-98.2 F (36.8 C)] 98.2 F (36.8 C) (05/25 0630) Pulse Rate:  [58-64] 64 (05/25 0630) Resp:  [15-19] 16 (05/25 0630) BP: (150-154)/(80-84) 153/83 (05/25 0630) SpO2:  [92 %-99 %] 99 % (05/25 0630) Last BM Date : 07/19/23  Intake/Output from previous day: 05/24 0701 - 05/25 0700 In: -  Out: 100 [Drains:100] Intake/Output this shift: No intake/output data recorded.  Incision/Wound: port sites intact  Needs to shower  JP site intact  Serous  drainage 100 cc recorded    Lab Results:  Recent Labs    07/19/23 0645 07/20/23 0613  WBC 9.4 7.3  HGB 11.6* 12.1  HCT 34.8* 35.4*  PLT 177 167   BMET Recent Labs    07/19/23 0645 07/20/23 0613  NA 133* 134*  K 3.7 3.6  CL 101 101  CO2 24 24  GLUCOSE 142* 129*  BUN 10 11  CREATININE 0.54 0.39*  CALCIUM  9.4 9.3   PT/INR No results for input(s): "LABPROT", "INR" in the last 72 hours. ABG No results for input(s): "PHART", "HCO3" in the last 72 hours.  Invalid input(s): "PCO2", "PO2"  Studies/Results: DG ESOPHAGUS W SINGLE CM (SOL OR THIN BA) Result Date: 07/18/2023 CLINICAL DATA:  Provided history: Status post laparoscopic fundoplication. Additional history obtained from electronic MEDICAL RECORD NUMBERStatus post robotic reduction of paraesophageal hiatal hernia, mediastinal dissection, crural release with component separation, repair of hiatal hernia over pledgets, mesh reinforcement, anterior and posterior gastropexy, Toupet fundoplication. EXAM: ESOPHOGRAM/BARIUM SWALLOW TECHNIQUE: A single contrast examination was performed using water soluble contrast. FLUOROSCOPY: Radiation Exposure Index (as provided by the fluoroscopic device): 32.90 mGy Kerma COMPARISON:  CT abdomen/pelvis 07/15/2023. FINDINGS: A problem-oriented water-soluble contrast esophagram was performed to assess  for post-operative leak or obstruction. Narrowed appearance of the distal esophagus/gastroesophageal junction consistent with the provided history of fundoplication. Mildly delayed passage of contrast from the distal esophagus into the stomach, possibly due to post-operative edema. No extraluminal contrast demonstrated to suggest a post-operative leak. IMPRESSION: 1. No evidence of a post-operative leak. 2. Mildly delayed passage of contrast from the distal esophagus into the stomach, possibly due to post-operative edema. Electronically Signed   By: Bascom Lily D.O.   On: 07/18/2023 09:27    Anti-infectives: Anti-infectives (From admission, onward)    Start     Dose/Rate Route Frequency Ordered Stop   07/17/23 0740  sodium chloride  0.9 % with cefTRIAXone  (ROCEPHIN ) ADS Med       Note to Pharmacy: [name]: cabinet override      07/17/23 0740 07/17/23 0846   07/17/23 0600  cefTRIAXone  (ROCEPHIN ) 2 g in sodium chloride  0.9 % 100 mL IVPB        2 g 200 mL/hr over 30 Minutes Intravenous On call to O.R. 07/16/23 0901 07/17/23 1223       Assessment/Plan: s/p Procedure(s): REPAIR, HERNIA, PARAESOPHAGEAL, ROBOT-ASSISTED Type II mediastinal dissection. Bilateral crural release with component separation Primary repair of hiatal hernia over pledgets.  Mesh reinforcement with absorbable mesh (BioA) Anterior & posterior gastropexy, fundoplication (N/A) Doing well Needs to shower  Minimal bruising - not unexpected  JP stays in- instructions in the chart Follow up in the chart Post op instructions and diet instructions in the chart  Dressings an be removed in 24 hours  Ok for discharge from surgery standpoint   LOS: 5 days  Andy Bannister A Cillian Gwinner 07/20/2023

## 2023-07-20 NOTE — Plan of Care (Signed)

## 2023-07-20 NOTE — Plan of Care (Signed)
  Problem: Clinical Measurements: Goal: Ability to maintain clinical measurements within normal limits will improve Outcome: Progressing Goal: Will remain free from infection Outcome: Progressing Goal: Diagnostic test results will improve Outcome: Progressing Goal: Respiratory complications will improve Outcome: Progressing Goal: Cardiovascular complication will be avoided Outcome: Progressing   Problem: Coping: Goal: Level of anxiety will decrease Outcome: Progressing   Problem: Pain Managment: Goal: General experience of comfort will improve and/or be controlled Outcome: Progressing   Problem: Safety: Goal: Ability to remain free from injury will improve Outcome: Progressing

## 2023-07-20 NOTE — Evaluation (Signed)
 Occupational Therapy Evaluation Patient Details Name: Linda Glass MRN: [id number] DOB: 1933/06/11 Today's Date: 07/20/2023   History of Present Illness   patient  is an 89 year old female presenting to [facility] ED on 5/20 with gastric volvulus.paraesophageal hernia repair on 5/22.  Patient underwent PMH: recent hospitalization 5/6. T8 and T12 fx,AKI, HLD, HTN, PAF, anemia,persistent A fib, anxiety.     Clinical Impressions Patient is a 89 year old female who was admitted for above. Patient was living at home alone prior to hospitalization in beginning of month. Patient has been at [facility] for short term rehab since this last hospitalization d/c. Patient is currently supervision for ADLs in room with RW with cues and education on slowing down and ECT.  Patients son in law is here now and plans to higher caregiver support at home for ADLs and Jp drain management.      If plan is discharge home, recommend the following:   A little help with walking and/or transfers;A little help with bathing/dressing/bathroom;Assistance with cooking/housework;Direct supervision/assist for medications management;Assist for transportation;Help with stairs or ramp for entrance;Direct supervision/assist for financial management     Functional Status Assessment   Patient has had a recent decline in their functional status and demonstrates the ability to make significant improvements in function in a reasonable and predictable amount of time.     Equipment Recommendations   None recommended by OT      Precautions/Restrictions   Precautions Precautions: Fall Precaution/Restrictions Comments: Jp drain, hears best in R ear Restrictions Weight Bearing Restrictions Per Provider Order: No     Mobility Bed Mobility Overal bed mobility: Needs Assistance Bed Mobility: Supine to Sit     Supine to sit: Supervision     General bed mobility comments: patients son in law reporting that patient has  high bed at home. patient and son in law were educated on compensatory strategies with both reporting they will work on it in next level of care.       Balance Overall balance assessment: Needs assistance Sitting-balance support: No upper extremity supported, Feet supported Sitting balance-Leahy Scale: Fair     Standing balance support: During functional activity, Reliant on assistive device for balance Standing balance-Leahy Scale: Fair       ADL either performed or assessed with clinical judgement   ADL Overall ADL's : Needs assistance/impaired Eating/Feeding: Modified independent   Grooming: Sitting;Set up   Upper Body Bathing: Sitting;Set up   Lower Body Bathing: Sitting/lateral leans;Set up;Supervison/ safety   Upper Body Dressing : Sitting;Set up   Lower Body Dressing: Sitting/lateral leans;Supervision/safety;Set up Lower Body Dressing Details (indicate cue type and reason): patient requested to don adult absorbent undergarments with patient able to navigate them around her drain sight with no issues. patient has good awareness of drain location. Toilet Transfer: Rolling walker (2 wheels);Set up;Supervision/safety;Ambulation Toilet Transfer Details (indicate cue type and reason): in hallway with cues to slow down. Toileting- Clothing Manipulation and Hygiene: Set up;Supervision/safety;Sit to/from stand Toileting - Clothing Manipulation Details (indicate cue type and reason): patient reported being fearful of eating as it causes her to have loose bowel movements. nurse made aware. nurse reporting being aware already.     Functional mobility during ADLs: Supervision/safety;Rolling walker (2 wheels) General ADL Comments: son in law present in room during session. patient and son in law were educated on importance of taking it at medium speed at home and leaving things taht were not the necessities until feeling better. patient and son in  law were educated on [facility] services and  patient agreeable at this time. son in law  reporting that they plan to higher caregiver to come check on patient during the day and help with ADLs as needed (supervision as patient will not allow son in law to be present during these tasks.) . son in law asked when she will be discharged to be able to align caregiver support. MD sent message via secure chat about this. nurse to communicate d/c plan with son in law. nurse made aware son in law or caregiver will need to be trained in jp drain management. nurse verbalized understanding.     Vision Baseline Vision/History: 1 Wears glasses Vision Assessment?: Wears glasses for reading            Pertinent Vitals/Pain Pain Assessment Pain Assessment: Faces Faces Pain Scale: Hurts a little bit Pain Location: drain site Pain Descriptors / Indicators: Grimacing Pain Intervention(s): Limited activity within patient's tolerance, Monitored during session     Extremity/Trunk Assessment Upper Extremity Assessment Upper Extremity Assessment: Overall WFL for tasks assessed       Cervical / Trunk Assessment Cervical / Trunk Assessment: Normal   Communication Communication Factors Affecting Communication: Hearing impaired   Cognition Arousal: Alert Behavior During Therapy: WFL for tasks assessed/performed Cognition: No apparent impairments             OT - Cognition Comments: impulsive with movements. HOH hears best on R ear                 Following commands: Intact       Cueing  General Comments   Cueing Techniques: Verbal cues              Home Living Family/patient expects to be discharged to:: Private residence Living Arrangements: Alone Available Help at Discharge: Available PRN/intermittently;Family (son in law) Type of Home: House Home Access: Level entry     Home Layout: Able to live on main level with bedroom/bathroom;Two level               Home Equipment: Agricultural consultant (2 wheels);BSC/3in1    Additional Comments: son in law lives near by and daughter plans to move when scool ends on [address] reported that he is highering caregiver to help/ check in on patient during the day.      Prior Functioning/Environment Prior Level of Function : Independent/Modified Independent             Mobility Comments: uses a RW for ambulation. ADLs Comments: Pt was independent with ADLs and driving; she had hired assist for cleaning.    OT Problem List: Impaired balance (sitting and/or standing);Decreased knowledge of precautions;Decreased safety awareness;Decreased activity tolerance;Decreased knowledge of use of DME or AE   OT Treatment/Interventions: Self-care/ADL training;DME and/or AE instruction;Balance training;Therapeutic activities;Therapeutic exercise;Energy conservation;Patient/family education      OT Goals(Current goals can be found in the care plan section)   Acute Rehab OT Goals Patient Stated Goal: to go home and stay home OT Goal Formulation: With patient/family Time For Goal Achievement: 08/03/23 Potential to Achieve Goals: Fair   OT Frequency:  Min 2X/week    Co-evaluation PT/OT/SLP Co-Evaluation/Treatment: Yes Reason for Co-Treatment: To address functional/ADL transfers PT goals addressed during session: Mobility/safety with mobility OT goals addressed during session: ADL's and self-care      AM-PAC OT "6 Clicks" Daily Activity     Outcome Measure Help from another person eating meals?: None Help from another person taking care of  personal grooming?: A Little Help from another person toileting, which includes using toliet, bedpan, or urinal?: A Little Help from another person bathing (including washing, rinsing, drying)?: A Little Help from another person to put on and taking off regular upper body clothing?: A Little Help from another person to put on and taking off regular lower body clothing?: A Little 6 Click Score: 19   End of Session Equipment  Utilized During Treatment: Rolling walker (2 wheels);Gait belt Nurse Communication: Other (comment) (son in law concerns about d/c home)  Activity Tolerance: Patient tolerated treatment well Patient left: in chair;with call bell/phone within reach;with chair alarm set;with family/visitor present  OT Visit Diagnosis: Unsteadiness on feet (R26.81);Other abnormalities of gait and mobility (R26.89);History of falling (Z91.81)                Time: 1610-9604 OT Time Calculation (min): 31 min Charges:  OT General Charges $OT Visit: 1 Visit OT Evaluation $OT Eval Low Complexity: 1 Low  Jolina Symonds OTR/L, MS Acute Rehabilitation Department Office# [phone]   Jame Maze 07/20/2023, 3:28 PM

## 2023-07-20 NOTE — Evaluation (Signed)
 Physical Therapy Evaluation Patient Details Name: Linda Glass MRN: [id number] DOB: 08/14/1933 Today's Date: 07/20/2023  History of Present Illness  patient  is an 89 year old female presenting to [facility] ED on 5/20 with gastric volvulus.paraesophageal hernia repair on 5/22.  Patient underwent PMH: recent hospitalization 5/6. T8 and T12 fx,AKI, HLD, HTN, PAF, anemia,persistent A fib, anxiety.  Clinical Impression   Patient is a 89 year old female who was admitted for above. Patient was living at home alone prior to hospitalization in beginning of month. Patient has been at [facility] for short term rehab since this last hospitalization d/c. Patient is currently CGA/supervision for basic mobility tasks with cues and education on slowing down for safety. Patients son in law is here now and plans to higher caregiver support at home for ADLs and Jp drain management.       If plan is discharge home, recommend the following: A little help with walking and/or transfers;A little help with bathing/dressing/bathroom;Assistance with cooking/housework;Assist for transportation;Help with stairs or ramp for entrance   Can travel by private vehicle   Yes    Equipment Recommendations None recommended by PT  Recommendations for Other Services       Functional Status Assessment Patient has had a recent decline in their functional status and demonstrates the ability to make significant improvements in function in a reasonable and predictable amount of time.     Precautions / Restrictions Precautions Precautions: Fall Recall of Precautions/Restrictions: Intact Precaution/Restrictions Comments: Jp drain, hears best in R ear Restrictions Weight Bearing Restrictions Per Provider Order: No      Mobility  Bed Mobility Overal bed mobility: Needs Assistance Bed Mobility: Supine to Sit     Supine to sit: Supervision     General bed mobility comments: patients son in law reporting that patient has  high bed at home. patient and son in law were educated on compensatory strategies with both reporting they will work on it in next level of care.    Transfers Overall transfer level: Needs assistance Equipment used: Rolling walker (2 wheels) Transfers: Sit to/from Stand Sit to Stand: Contact guard assist, Supervision           General transfer comment: cues for safety and use of UEs to self assist    Ambulation/Gait Ambulation/Gait assistance: Contact guard assist Gait Distance (Feet): 300 Feet Assistive device: Rolling walker (2 wheels) Gait Pattern/deviations: Step-through pattern, Decreased step length - right, Decreased step length - left, Shuffle, Trunk flexed       General Gait Details: cues for posture, position from RW and to slow pace for safety  Stairs            Wheelchair Mobility     Tilt Bed    Modified Rankin (Stroke Patients Only)       Balance Overall balance assessment: Needs assistance Sitting-balance support: No upper extremity supported, Feet supported Sitting balance-Leahy Scale: Fair     Standing balance support: During functional activity, Reliant on assistive device for balance Standing balance-Leahy Scale: Fair                               Pertinent Vitals/Pain Pain Assessment Pain Assessment: Faces Faces Pain Scale: Hurts a little bit Pain Location: drain site Pain Descriptors / Indicators: Grimacing Pain Intervention(s): Limited activity within patient's tolerance, Monitored during session    Home Living Family/patient expects to be discharged to:: Private residence Living Arrangements: Alone  Available Help at Discharge: Available PRN/intermittently;Family Type of Home: House Home Access: Level entry       Home Layout: Able to live on main level with bedroom/bathroom;Two level Home Equipment: Agricultural consultant (2 wheels);BSC/3in1 Additional Comments: son in law lives near by and daughter plans to move when  scool ends on [address] reported that he is highering caregiver to help/ check in on patient during the day.    Prior Function Prior Level of Function : Independent/Modified Independent             Mobility Comments: uses a RW for ambulation. ADLs Comments: Pt was independent with ADLs and driving; she had hired assist for cleaning.     Extremity/Trunk Assessment   Upper Extremity Assessment Upper Extremity Assessment: Overall WFL for tasks assessed    Lower Extremity Assessment Lower Extremity Assessment: Generalized weakness    Cervical / Trunk Assessment Cervical / Trunk Assessment: Normal  Communication   Communication Communication: Impaired Factors Affecting Communication: Hearing impaired    Cognition Arousal: Alert Behavior During Therapy: WFL for tasks assessed/performed   PT - Cognitive impairments: No apparent impairments                       PT - Cognition Comments: AxO x 3 very sharpe lady who lives home alone and was driving.  VERY HOH.  Impulsive.  HIGH anxiety/fear of falling Following commands: Intact       Cueing Cueing Techniques: Verbal cues     General Comments      Exercises     Assessment/Plan    PT Assessment Patient needs continued PT services  PT Problem List Decreased strength;Decreased range of motion;Decreased activity tolerance;Decreased balance;Decreased mobility;Decreased knowledge of use of DME;Decreased safety awareness       PT Treatment Interventions DME instruction;Gait training;Functional mobility training;Therapeutic activities;Therapeutic exercise;Patient/family education;Balance training;Stair training    PT Goals (Current goals can be found in the Care Plan section)  Acute Rehab PT Goals Patient Stated Goal: home! PT Goal Formulation: With patient Time For Goal Achievement: 08/03/23 Potential to Achieve Goals: Good    Frequency Min 3X/week     Co-evaluation PT/OT/SLP Co-Evaluation/Treatment:  Yes Reason for Co-Treatment: To address functional/ADL transfers PT goals addressed during session: Mobility/safety with mobility OT goals addressed during session: ADL's and self-care       AM-PAC PT "6 Clicks" Mobility  Outcome Measure Help needed turning from your back to your side while in a flat bed without using bedrails?: A Little Help needed moving from lying on your back to sitting on the side of a flat bed without using bedrails?: A Little Help needed moving to and from a bed to a chair (including a wheelchair)?: A Little Help needed standing up from a chair using your arms (e.g., wheelchair or bedside chair)?: A Little Help needed to walk in hospital room?: A Little Help needed climbing 3-5 steps with a railing? : A Lot 6 Click Score: 17    End of Session Equipment Utilized During Treatment: Gait belt Activity Tolerance: Patient tolerated treatment well Patient left: in chair;with call bell/phone within reach;with chair alarm set;with family/visitor present Nurse Communication: Mobility status PT Visit Diagnosis: Unsteadiness on feet (R26.81);Muscle weakness (generalized) (M62.81);Difficulty in walking, not elsewhere classified (R26.2)    Time: 2841-3244 PT Time Calculation (min) (ACUTE ONLY): 29 min   Charges:   PT Evaluation $PT Eval Low Complexity: 1 Low   PT General Charges $$ ACUTE PT VISIT: 1 Visit  Thedora Finlay PT Acute Rehabilitation Services Pager [phone] Office [phone]   [facility] 07/20/2023, 5:14 PM

## 2023-07-21 DIAGNOSIS — K3189 Other diseases of stomach and duodenum: Secondary | ICD-10-CM | POA: Diagnosis not present

## 2023-07-21 DIAGNOSIS — I4821 Permanent atrial fibrillation: Secondary | ICD-10-CM | POA: Diagnosis not present

## 2023-07-21 NOTE — Progress Notes (Signed)
 AVS reviewed w/ pt  & son who verbalized an understanding . PIV removed as noted - JP teaching completed- specimen container and safety pin supplied to pt- Pt dressing for d/c to home. No other questions at this time.

## 2023-07-21 NOTE — Care Management Important Message (Signed)
 Important Message  Patient Details  Name: Linda Glass MRN: [id number] Date of Birth: [birth year]   Important Message Given:  Yes - Medicare IM (reviewed letter with Ms. Rabadan at [phone])     Neila Bally 07/21/2023, 11:51 AM

## 2023-07-21 NOTE — Progress Notes (Signed)
 Mobility Specialist - Progress Note   07/21/23 0953  Mobility  Activity Ambulated with assistance in hallway  Level of Assistance Modified independent, requires aide device or extra time  Assistive Device Front wheel walker  Distance Ambulated (ft) 275 ft  Activity Response Tolerated well  Mobility Referral Yes  Mobility visit 1 Mobility  Mobility Specialist Start Time (ACUTE ONLY) 0944  Mobility Specialist Stop Time (ACUTE ONLY) [id number]  Mobility Specialist Time Calculation (min) (ACUTE ONLY) 8 min   Pt received in bed and agreeable to mobility. SOB with session. Unable to get accurate SpO2 reading. Pt to recliner after session with all needs met.    [facility]

## 2023-07-21 NOTE — Plan of Care (Signed)
  Problem: Clinical Measurements: Goal: Ability to maintain clinical measurements within normal limits will improve Reactivated Goal: Diagnostic test results will improve Reactivated

## 2023-07-21 NOTE — Progress Notes (Signed)
 4 Days Post-Op   Subjective/Chief Complaint: No complaints    Objective: Vital signs in last 24 hours: Temp:  [97.5 F (36.4 C)-97.6 F (36.4 C)] 97.6 F (36.4 C) (05/26 0450) Pulse Rate:  [52-60] 60 (05/26 0700) Resp:  [18] 18 (05/26 0450) BP: (158-172)/(68-91) 168/91 (05/26 0700) SpO2:  [95 %-99 %] 99 % (05/26 0700) Last BM Date : 07/20/23  Intake/Output from previous day: 05/25 0701 - 05/26 0700 In: -  Out: 1840 [Urine:1800; Drains:40] Intake/Output this shift: No intake/output data recorded.  Abd: CDI soft   Lab Results:  Recent Labs    07/19/23 0645 07/20/23 0613  WBC 9.4 7.3  HGB 11.6* 12.1  HCT 34.8* 35.4*  PLT 177 167   BMET Recent Labs    07/19/23 0645 07/20/23 0613  NA 133* 134*  K 3.7 3.6  CL 101 101  CO2 24 24  GLUCOSE 142* 129*  BUN 10 11  CREATININE 0.54 0.39*  CALCIUM  9.4 9.3   PT/INR No results for input(s): "LABPROT", "INR" in the last 72 hours. ABG No results for input(s): "PHART", "HCO3" in the last 72 hours.  Invalid input(s): "PCO2", "PO2"  Studies/Results: No results found.  Anti-infectives: Anti-infectives (From admission, onward)    Start     Dose/Rate Route Frequency Ordered Stop   07/17/23 0740  sodium chloride  0.9 % with cefTRIAXone  (ROCEPHIN ) ADS Med       Note to Pharmacy: Linda Glass: cabinet override      07/17/23 0740 07/17/23 0846   07/17/23 0600  cefTRIAXone  (ROCEPHIN ) 2 g in sodium chloride  0.9 % 100 mL IVPB        2 g 200 mL/hr over 30 Minutes Intravenous On call to O.R. 07/16/23 0901 07/17/23 1223       Assessment/Plan: s/p Procedure(s): REPAIR, HERNIA, PARAESOPHAGEAL, ROBOT-ASSISTED Type II mediastinal dissection. Bilateral crural release with component separation Primary repair of hiatal hernia over pledgets.  Mesh reinforcement with absorbable mesh (BioA) Anterior & posterior gastropexy, fundoplication (N/A)   Instructions in the chart Office will call tomorrow to bring her in for drain  check on Friday Pt to be D/C with drain    LOS: 6 days    Linda Glass Linda Glass 07/21/2023

## 2023-07-21 NOTE — TOC Transition Note (Signed)
 Transition of Care [facility]) - Discharge Note   Patient Details  Name: Linda Glass MRN: [id number] Date of Birth: 03-21-1933  [facility] CM/SW Contact:  Delilah Fend, LCSW Phone Number: 07/21/2023, 10:43 AM   Clinical Narrative:     MD placed orders for dc and HHRN/PT/OT.  Met with pt who is aware and agreeable with [facility] services and has NO preference for [facility] agencies.  Able to secure coverage with [facility] and agency info added to AVS.   No further TOC needs.   Final next level of care: Home w Home Health Services Barriers to Discharge: Barriers Resolved   Patient Goals and CMS Choice Patient states their goals for this hospitalization and ongoing recovery are:: return home          Discharge Placement                       Discharge Plan and Services Additional resources added to the After Visit Summary for                  DME Arranged: N/A DME Agency: NA       HH Arranged: RN, PT, OT HH Agency: [facility] Date [facility] Agency Contacted: 07/21/23 Time HH Agency Contacted: 1043 Representative spoke with at [facility] Agency: [facility]  Social Drivers of Health (SDOH) Interventions SDOH Screenings   Food Insecurity: No Food Insecurity (07/15/2023)  Housing: Low Risk  (07/15/2023)  Transportation Needs: No Transportation Needs (07/15/2023)  Utilities: Not At Risk (07/15/2023)  Alcohol Screen: Low Risk  (08/21/2022)  Depression (PHQ2-9): Low Risk  (10/22/2022)  Financial Resource Strain: Low Risk  (08/21/2022)  Physical Activity: Insufficiently Active (08/21/2022)  Social Connections: Moderately Isolated (07/15/2023)  Stress: No Stress Concern Present (08/21/2022)  Tobacco Use: Low Risk  (07/17/2023)     Readmission Risk Interventions    07/16/2023   12:25 PM  Readmission Risk Prevention Plan  Transportation Screening Complete  PCP or Specialist Appt within 3-5 Days Complete  HRI or Home Care Consult Complete  Social Work  Consult for Recovery Care Planning/Counseling Complete  Palliative Care Screening Not Applicable  Medication Review Oceanographer) Complete

## 2023-07-21 NOTE — Plan of Care (Signed)
  Problem: Clinical Measurements: Goal: Ability to maintain clinical measurements within normal limits will improve Outcome: Adequate for Discharge Goal: Diagnostic test results will improve Outcome: Adequate for Discharge   

## 2023-07-21 NOTE — Discharge Summary (Signed)
 Physician Discharge Summary   Patient: Linda Glass MRN: [id number] DOB: [birth year]  Admit date:     07/15/2023  Discharge date: 07/21/23  Discharge Physician: Jerline Moon   PCP: [name], [name], MD   Recommendations at discharge:   Gastric volvulus Pureed diet for 6 weeks  JP stays in- instructions in the chart Dressings can be removed in 24 hours    Pelvic cystic structure Unchanged on CT.  Consider outpatient ultrasound follow-up.  Discharge Diagnoses: Principal Problem:   Mesenteroaxial gastric volvulus Active Problems:   Class 1 obesity   Hyperlipidemia   Hypertension, essential, benign   Generalized anxiety disorder   Persistent atrial fibrillation (HCC)   Hypokalemia   HOH (hard of hearing)   Chronic diastolic heart failure (HCC)   Hypomagnesemia   Incarcerated hiatal hernia   Long term current use of anticoagulant therapy   Uses roller walker   Chronic atrial fibrillation (HCC)   Anxiety   Volvulus of stomach   Preop examination   Permanent atrial fibrillation (HCC)  Resolved Problems:   * No resolved hospital problems. *  Hospital Course: 89 year old woman PMH including atrial fibrillation on apixaban  presented with abdominal pain nausea and vomiting.  Imaging showed gastric volvulus.  NG tube placed, seen by general surgery with plans for operative intervention.  Seen by cardiology for cardiac clearance.  Underwent surgical intervention without significant postoperative complications.  Diet slowly advanced, doing well overall.  Cleared for discharge by general surgery.  Consultants General surgery Cardiology   Procedures/Events 5/22 reduction paraesophageal hiatal hernia  Gastric volvulus Seen on CT.   Status post surgery 5/22.  Management per general surgery. Pureed diet for 6 weeks  JP stays in- instructions in the chart Dressings can be removed in 24 hours    Permanent atrial fibrillation Mild mitral valve  regurgitation Continue apixaban   Echocardiogram showed LVEF 60 to 65%, severe left atrial enlargement   Hypokalemia  Repleted   Hypertension  Hyperlipidemia Continue carvedilol   Anxiety Continue Celexa   Ativan  at night   Pelvic cystic structure Unchanged on CT.  Consider outpatient ultrasound follow-up.  Disposition: Home health Diet recommendation:  Discharge Diet Orders (From admission, onward)     Start     Ordered   07/19/23 0000  Diet full liquid       Comments: Pureed (blenderized) diet. Expect some sticking with swallowing over the next 1-2 months.   This is due to swelling around your esophagus at the wrap & hiatal diaphragm repair.  It will gradually ease off over the next few months.   07/19/23 0851   07/18/23 0000  Diet general       Comments: SEE ESOPHAGEAL SURGERY DIET INSTRUCTIONS  We using usually start you out on a pureed (blenderized) diet. Expect some sticking with swallowing over the next 1-2 months.   This is due to swelling around your esophagus at the wrap & hiatal diaphragm repair.  It will gradually ease off over the next few months.   07/18/23 1126           Pureed diet   DISCHARGE MEDICATION: Allergies as of 07/21/2023       Reactions   Losartan  Other (See Comments)   diarrhea        Medication List     PAUSE taking these medications    furosemide  80 MG tablet Wait to take this until: Jul 26, 2023 Commonly known as: LASIX  TAKE ONE TABLET BY MOUTH DAILY  STOP taking these medications    Magnesium  200 MG Tabs       TAKE these medications    acetaminophen  325 MG tablet Commonly known as: TYLENOL  Take 2 tablets (650 mg total) by mouth every 6 (six) hours as needed for mild pain (or Fever >/= 101).   amLODipine  10 MG tablet Commonly known as: NORVASC  TAKE ONE TABLET BY MOUTH DAILY   atorvastatin  40 MG tablet Commonly known as: LIPITOR TAKE 1 TABLET EACH DAY. What changed: See the new instructions.    citalopram  20 MG tablet Commonly known as: CELEXA  Take 1 tablet (20 mg total) by mouth daily.   Eliquis  5 MG Tabs tablet Generic drug: apixaban  TAKE ONE TABLET BY MOUTH TWICE DAILY   Ferrous Gluconate  324 (37.5 Fe) MG Tabs Take 324 mg by mouth every other day.   folic acid  1 MG tablet Commonly known as: FOLVITE  Take 1 tablet (1 mg total) by mouth daily.   LORazepam  0.5 MG tablet Commonly known as: ATIVAN  Take 1 tablet (0.5 mg total) by mouth at bedtime.   metoprolol  succinate 25 MG 24 hr tablet Commonly known as: TOPROL -XL Take 0.5 tablets (12.5 mg total) by mouth daily.   multivitamin with minerals Tabs tablet Take 1 tablet by mouth daily.   ondansetron  4 MG tablet Commonly known as: ZOFRAN  Take 1 tablet (4 mg total) by mouth every 8 (eight) hours as needed for nausea or vomiting.   pantoprazole  40 MG tablet Commonly known as: PROTONIX  TAKE ONE TABLET BY MOUTH ONCE DAILY   polyethylene glycol powder 17 GM/SCOOP powder Commonly known as: MiraLax Take 17 g by mouth 2 (two) times daily as needed for mild constipation.   thiamine  100 MG tablet Commonly known as: Vitamin B-1 Take 1 tablet (100 mg total) by mouth daily.   VITAMIN B 12 PO Take 500 mcg by mouth daily.        Follow-up Information     [facility] Emergency Department at [facility]. Go to .   Specialty: Emergency Medicine Why: If symptoms worsen Contact information: [address] [place]  [address] [phone]        Candyce Champagne, MD. Go on 08/04/2023.   Specialties: General Surgery, Colon and Rectal Surgery Why: 3:45 PM, please arrive by 3:30 PM to check in Contact information: [address] [place] [address] [phone]         [name], [name], MD. Schedule an appointment as soon as possible for a visit in 1 week(s).   Specialty: Family Medicine Contact information: [address] [place] [address] [phone]          [facility] Follow up.   Specialty: Home Health Services Why: to provide home nursing and therapy visits Contact information: [address] [place] [address] [phone]                Discharge Exam: Linda Glass   07/17/23 0702  Weight: 71.2 kg   Physical Exam Vitals reviewed.  Constitutional:      General: She is not in acute distress.    Appearance: She is not ill-appearing or toxic-appearing.  Cardiovascular:     Rate and Rhythm: Normal rate and regular rhythm.     Heart sounds: No murmur heard. Pulmonary:     Effort: Pulmonary effort is normal. No respiratory distress.     Breath sounds: No wheezing, rhonchi or rales.  Neurological:  Mental Status: She is alert.  Psychiatric:        Mood and Affect: Mood normal.        Behavior: Behavior normal.      Condition at discharge: good  The results of significant diagnostics from this hospitalization (including imaging, microbiology, ancillary and laboratory) are listed below for reference.   Imaging Studies: DG ESOPHAGUS W SINGLE CM (SOL OR THIN BA) Result Date: 07/18/2023 CLINICAL DATA:  Provided history: Status post laparoscopic fundoplication. Additional history obtained from electronic MEDICAL RECORD NUMBERStatus post robotic reduction of paraesophageal hiatal hernia, mediastinal dissection, crural release with component separation, repair of hiatal hernia over pledgets, mesh reinforcement, anterior and posterior gastropexy, Toupet fundoplication. EXAM: ESOPHOGRAM/BARIUM SWALLOW TECHNIQUE: A single contrast examination was performed using water soluble contrast. FLUOROSCOPY: Radiation Exposure Index (as provided by the fluoroscopic device): 32.90 mGy Kerma COMPARISON:  CT abdomen/pelvis 07/15/2023. FINDINGS: A problem-oriented water-soluble contrast esophagram was performed to assess for post-operative leak or obstruction. Narrowed appearance of the distal esophagus/gastroesophageal  junction consistent with the provided history of fundoplication. Mildly delayed passage of contrast from the distal esophagus into the stomach, possibly due to post-operative edema. No extraluminal contrast demonstrated to suggest a post-operative leak. IMPRESSION: 1. No evidence of a post-operative leak. 2. Mildly delayed passage of contrast from the distal esophagus into the stomach, possibly due to post-operative edema. Electronically Signed   By: Bascom Lily D.O.   On: 07/18/2023 09:27   ECHOCARDIOGRAM COMPLETE Result Date: 07/16/2023    ECHOCARDIOGRAM REPORT   Patient Name:   Linda Glass Date of Exam: 07/16/2023 Medical Rec #:  [id number]                   Height:       60.0 in Accession #:    [id number]                  Weight:       157.0 lb Date of Birth:  September 08, 1933                   BSA:          1.684 m Patient Age:    89 years                    BP:           140/68 mmHg Patient Gender: F                           HR:           77 bpm. Exam Location:  Inpatient Procedure: 2D Echo, Cardiac Doppler and Color Doppler (Both Spectral and Color            Flow Doppler were utilized during procedure). Indications:    Mitral Valve Disorder I05.9  History:        Patient has prior history of Echocardiogram examinations, most                 recent 10/03/2022. Signs/Symptoms:Murmur; Risk                 Factors:Hypertension.  Sonographer:    Astrid Blamer Referring Phys: [id number] SHENG L HALEY IMPRESSIONS  1. Left ventricular ejection fraction, by estimation, is 60 to 65%. The left ventricle has normal function. The left ventricle has no regional wall motion abnormalities. Left ventricular diastolic function could not be evaluated. Elevated left ventricular end-diastolic  pressure.  2. Right ventricular systolic function is normal. The right ventricular size is mildly enlarged. There is moderately elevated pulmonary artery systolic pressure.  3. Left atrial size was severely dilated.  4. The mitral  valve is normal in structure. No evidence of mitral valve regurgitation. No evidence of mitral stenosis. There is mild prolapse of posterior leaflet of the mitral valve.  5. The aortic valve is tricuspid. Aortic valve regurgitation is not visualized. No aortic stenosis is present.  6. The inferior vena cava is dilated in size with >50% respiratory variability, suggesting right atrial pressure of 8 mmHg. FINDINGS  Left Ventricle: Left ventricular ejection fraction, by estimation, is 60 to 65%. The left ventricle has normal function. The left ventricle has no regional wall motion abnormalities. The left ventricular internal cavity size was normal in size. There is  no left ventricular hypertrophy. Left ventricular diastolic function could not be evaluated due to atrial fibrillation. Left ventricular diastolic function could not be evaluated. Elevated left ventricular end-diastolic pressure. Right Ventricle: The right ventricular size is mildly enlarged. No increase in right ventricular wall thickness. Right ventricular systolic function is normal. There is moderately elevated pulmonary artery systolic pressure. The tricuspid regurgitant velocity is 3.11 m/s, and with an assumed right atrial pressure of 8 mmHg, the estimated right ventricular systolic pressure is 46.7 mmHg. Left Atrium: Left atrial size was severely dilated. Right Atrium: Right atrial size was normal in size. Pericardium: There is no evidence of pericardial effusion. Mitral Valve: The mitral valve is normal in structure. There is mild prolapse of posterior leaflet of the mitral valve. Mild mitral annular calcification. No evidence of mitral valve regurgitation. No evidence of mitral valve stenosis. Tricuspid Valve: The tricuspid valve is normal in structure. Tricuspid valve regurgitation is mild . No evidence of tricuspid stenosis. Aortic Valve: The aortic valve is tricuspid. Aortic valve regurgitation is not visualized. No aortic stenosis is present.  Aortic valve mean gradient measures 3.0 mmHg. Aortic valve peak gradient measures 6.1 mmHg. Aortic valve area, by VTI measures 2.59 cm. Pulmonic Valve: The pulmonic valve was normal in structure. Pulmonic valve regurgitation is not visualized. No evidence of pulmonic stenosis. Aorta: The aortic root is normal in size and structure. Venous: The inferior vena cava is dilated in size with greater than 50% respiratory variability, suggesting right atrial pressure of 8 mmHg. IAS/Shunts: No atrial level shunt detected by color flow Doppler.  LEFT VENTRICLE PLAX 2D LVIDd:         5.00 cm   Diastology LVIDs:         3.40 cm   LV e' medial:    6.64 cm/s LV PW:         1.00 cm   LV E/e' medial:  23.0 LV IVS:        1.20 cm   LV e' lateral:   10.20 cm/s LVOT diam:     1.90 cm   LV E/e' lateral: 15.0 LV SV:         73 LV SV Index:   43 LVOT Area:     2.84 cm  RIGHT VENTRICLE             IVC RV Basal diam:  4.90 cm     IVC diam: 2.20 cm RV Mid diam:    3.60 cm RV S prime:     12.30 cm/s TAPSE (M-mode): 2.1 cm LEFT ATRIUM              Index  RIGHT ATRIUM           Index LA Vol (A2C):   84.3 ml  50.06 ml/m  RA Area:     27.00 cm LA Vol (A4C):   106.0 ml 62.94 ml/m  RA Volume:   90.90 ml  53.97 ml/m LA Biplane Vol: 96.8 ml  57.48 ml/m  AORTIC VALVE AV Area (Vmax):    2.97 cm AV Area (Vmean):   2.79 cm AV Area (VTI):     2.59 cm AV Vmax:           123.00 cm/s AV Vmean:          86.900 cm/s AV VTI:            0.281 m AV Peak Grad:      6.1 mmHg AV Mean Grad:      3.0 mmHg LVOT Vmax:         129.00 cm/s LVOT Vmean:        85.500 cm/s LVOT VTI:          0.257 m LVOT/AV VTI ratio: 0.91  AORTA Ao Root diam: 2.30 cm MITRAL VALVE                TRICUSPID VALVE MV Area (PHT): 3.44 cm     TR Peak grad:   38.7 mmHg MV E velocity: 153.00 cm/s  TR Vmax:        311.00 cm/s                              SHUNTS                             Systemic VTI:  0.26 m                             Systemic Diam: 1.90 cm Maudine Sos MD  Electronically signed by Maudine Sos MD Signature Date/Time: 07/16/2023/4:16:31 PM    Final    DG Abd Portable 1 View Result Date: 07/15/2023 CLINICAL DATA:  Nasogastric tube placement. EXAM: PORTABLE ABDOMEN - 1 VIEW COMPARISON:  None Available. FINDINGS: Nasogastric tube tip is seen in proximal stomach. Distal side hole is still in distal esophagus. IMPRESSION: Nasogastric tube tip is seen in proximal stomach with distal side hole in esophagus; advancement is recommended. Electronically Signed   By: Rosalene Colon M.D.   On: 07/15/2023 17:44   DG Chest Port 1 View Result Date: 07/15/2023 CLINICAL DATA:  Cough. EXAM: PORTABLE CHEST 1 VIEW COMPARISON:  October 02, 2022. FINDINGS: Stable cardiomegaly. Nasogastric tube tip seen in proximal stomach. Large hiatal hernia is noted. Minimal left basilar subsegmental atelectasis. Right lung is clear. IMPRESSION: Large hiatal hernia.  Minimal left basilar subsegmental atelectasis. Electronically Signed   By: Rosalene Colon M.D.   On: 07/15/2023 17:43   CT ABDOMEN PELVIS W CONTRAST Addendum Date: 07/15/2023 ADDENDUM REPORT: 07/15/2023 16:00 ADDENDUM: These results were called by telephone at the time of interpretation on 07/15/2023 at 3:59 pm to provider Dr. Lind Repine, who verbally acknowledged these results. Electronically Signed   By: Reagan Camera M.D.   On: 07/15/2023 16:00   Result Date: 07/15/2023 CLINICAL DATA:  Left-sided upper abdominal pain, vomiting EXAM: CT ABDOMEN AND PELVIS WITH CONTRAST TECHNIQUE: Multidetector CT imaging of the abdomen and pelvis was performed using the standard protocol  following bolus administration of intravenous contrast. RADIATION DOSE REDUCTION: This exam was performed according to the departmental dose-optimization program which includes automated exposure control, adjustment of the mA and/or kV according to patient size and/or use of iterative reconstruction technique. CONTRAST:  OMNIPAQUE  IOHEXOL  300 MG/ML  SOLN  COMPARISON:  CT of the abdomen and pelvis performed September 17, 2021. Abdominal ultrasound performed Jul 02, 2023 FINDINGS: Lower chest: A moderate size hiatal hernia is present. The stomach is rotated in the gastric outlet is in the lower mediastinum. The second and third portions of the duodenum are in anatomic location. When compared to CT scan performed September 17, 2021, there has been an increase in the portion of the stomach which is in the lower chest as well as the degree of gastric rotation. There is enhancement of the gastric wall visualized throughout. The imaged portion of the distal esophagus is not dilated. Hepatobiliary: Not significantly changed. Pancreas: Unchanged Spleen: Within normal limits. Adrenals/Urinary Tract: Motion related changes are present in both kidneys. Small left adrenal nodule, similar. A cyst is present in the lower pole left kidney which measures 1.3 cm. Stomach/Bowel: Hiatal hernia with rotated stomach is detailed above. The small bowel is nondilated. Colonic diverticular changes are present Vascular/Lymphatic: No significant vascular findings are present. No enlarged abdominal or pelvic lymph nodes. Reproductive: Status post hysterectomy. No adnexal masses. A cystic structure is present in the low pelvis which was seen on the previous exam. This measures approximately 6.0 by 4.3 cm, not significantly changed. Other: No abdominal wall hernia or abnormality. No abdominopelvic ascites. Musculoskeletal: Degenerative changes are present in the imaged osseous structures. Compression deformity of T12, similar when compared to the previous exam. IMPRESSION: 1. Abnormal appearance of the stomach with herniation into the lower chest and with imaging features of gastric rotation which can be observed in the setting of gastric volvulus. Based on imaging, organo-axial rotation is favored. There is diffuse symmetric enhancement of the stomach. 2. The previously observed cystic structure in the  pelvis is not significantly changed. Outpatient follow-up ultrasound should be considered. 3. Diverticulosis without evidence of diverticulitis. Electronically Signed: By: Reagan Camera M.D. On: 07/15/2023 15:40   CT HEAD WO CONTRAST ( ) Result Date: 07/02/2023 CLINICAL DATA:  Mental status change, unknown cause Sudden onset of shaking/tremulous EXAM: CT HEAD WITHOUT CONTRAST TECHNIQUE: Contiguous axial images were obtained from the base of the skull through the vertex without intravenous contrast. RADIATION DOSE REDUCTION: This exam was performed according to the departmental dose-optimization program which includes automated exposure control, adjustment of the mA and/or kV according to patient size and/or use of iterative reconstruction technique. COMPARISON:  CT head 10/02/2022. FINDINGS: Brain: No evidence of acute infarction, hemorrhage, hydrocephalus, extra-axial collection or mass lesion/mass effect. Vascular: No hyperdense vessel.  Calcific atherosclerosis. Skull: No acute fracture. Sinuses/Orbits: Clear sinuses.  No acute orbital findings. Other: No mastoid effusions. IMPRESSION: No evidence of acute intracranial abnormality. Electronically Signed   By: Stevenson Elbe M.D.   On: 07/02/2023 22:21   US  Abdomen Limited RUQ (LIVER/GB) Result Date: 07/02/2023 CLINICAL DATA:  Elevated LFTs EXAM: ULTRASOUND ABDOMEN LIMITED RIGHT UPPER QUADRANT COMPARISON:  CT abdomen and pelvis September 17, 2021 FINDINGS: Gallbladder: Multiple gallstones correlate with chronic cholelithiasis Common bile duct: Diameter: 7.3 mm, normal for patient's age Liver: Echogenic heterogeneous slightly nodular appearing liver could correlate with hepatocellular disease portal vein is patent on color Doppler imaging with normal direction of blood flow towards the liver. Other: None. IMPRESSION: *Chronic cholelithiasis. *Heterogeneous  slightly nodular appearing liver could correlate with hepatocellular disease. Electronically Signed   By:  Fredrich Jefferson M.D.   On: 07/02/2023 08:54    Microbiology: Results for orders placed or performed during the hospital encounter of 07/01/23  C Difficile Quick Screen w PCR reflex     Status: None   Collection Time: 07/02/23  8:40 AM   Specimen: STOOL  Result Value Ref Range Status   C Diff antigen NEGATIVE NEGATIVE Final   C Diff toxin NEGATIVE NEGATIVE Final   C Diff interpretation No C. difficile detected.  Final    Comment: Performed at [facility], [address], [place], [place] [address]  Gastrointestinal Panel by PCR , Stool     Status: None   Collection Time: 07/02/23  8:40 AM   Specimen: Stool  Result Value Ref Range Status   Campylobacter species NOT DETECTED NOT DETECTED Final   Plesimonas shigelloides NOT DETECTED NOT DETECTED Final   Salmonella species NOT DETECTED NOT DETECTED Final   Yersinia enterocolitica NOT DETECTED NOT DETECTED Final   Vibrio species NOT DETECTED NOT DETECTED Final   Vibrio cholerae NOT DETECTED NOT DETECTED Final   Enteroaggregative E coli (EAEC) NOT DETECTED NOT DETECTED Final   Enteropathogenic E coli (EPEC) NOT DETECTED NOT DETECTED Final   Enterotoxigenic E coli (ETEC) NOT DETECTED NOT DETECTED Final   Shiga like toxin producing E coli (STEC) NOT DETECTED NOT DETECTED Final   Shigella/Enteroinvasive E coli (EIEC) NOT DETECTED NOT DETECTED Final   Cryptosporidium NOT DETECTED NOT DETECTED Final   Cyclospora cayetanensis NOT DETECTED NOT DETECTED Final   Entamoeba histolytica NOT DETECTED NOT DETECTED Final   Giardia lamblia NOT DETECTED NOT DETECTED Final   Adenovirus F40/41 NOT DETECTED NOT DETECTED Final   Astrovirus NOT DETECTED NOT DETECTED Final   Norovirus GI/GII NOT DETECTED NOT DETECTED Final   Rotavirus A NOT DETECTED NOT DETECTED Final   Sapovirus (I, II, IV, and V) NOT DETECTED NOT DETECTED Final    Comment: Performed at [facility], [address] [place], [place] [address]     Labs: CBC: Recent Labs  Lab 07/16/23 0511 07/17/23 0543 07/18/23 0513 07/19/23 0645 07/20/23 0613  WBC 6.3 8.2 12.7* 9.4 7.3  HGB 12.0 12.3 11.0* 11.6* 12.1  HCT 36.5 38.9 33.5* 34.8* 35.4*  MCV 108.3* 112.4* 109.5* 107.1* 106.0*  PLT 166 170 169 177 167   Basic Metabolic Panel: Recent Labs  Lab 07/16/23 0511 07/16/23 0527 07/17/23 0543 07/18/23 0513 07/19/23 0645 07/20/23 0613  NA 137  --  136 135 133* 134*  K 2.8*  --  3.4* 3.7 3.7 3.6  CL 101  --  104 102 101 101  CO2 27  --  20* 25 24 24   GLUCOSE 95  --  63* 121* 142* 129*  BUN 7*  --  9 10 10 11   CREATININE 0.32*  --  0.49 0.57 0.54 0.39*  CALCIUM  8.6*  --  8.8* 9.2 9.4 9.3  MG  --  1.7 1.8 1.6* 1.6* 1.6*  PHOS  --  3.1  --  3.8  --  2.6   Liver Function Tests: Recent Labs  Lab 07/15/23 1308 07/20/23 0613  AST 32  --   ALT 23  --   ALKPHOS 120  --   BILITOT 1.0  --   PROT 7.5  --   ALBUMIN 3.8 2.8*   CBG: No results for input(s): "GLUCAP" in the last 168 hours.  Discharge  time spent: less than 30 minutes.  Signed: Jerline Moon, MD [facility] 07/21/2023

## 2023-07-22 ENCOUNTER — Telehealth: Payer: Self-pay

## 2023-07-22 NOTE — Transitions of Care (Post Inpatient/ED Visit) (Signed)
 07/22/2023  Name: Linda Glass MRN: [id number] DOB: 1933/11/14  Today's TOC FU Call Status: Today's TOC FU Call Status:: Successful TOC FU Call Completed TOC FU Call Complete Date: 07/22/23 Patient's Name and Date of Birth confirmed.  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 How have you been since you were released from the hospital?: Better (Reported some diarrhea,  eating and drinking well.) Any questions or concerns?: No  Items Reviewed: Did you receive and understand the discharge instructions provided?: Yes Medications obtained,verified, and reconciled?: Yes (Medications Reviewed) Any new allergies since your discharge?: No Dietary orders reviewed?: Yes Type of Diet Ordered:: pureed  ( blenderized) Do you have support at home?: Yes People in Home [RPT]: child(ren), adult Name of Support/Comfort Primary Source: Annette  Medications Reviewed Today: Medications Reviewed Today     Reviewed by Vanetta Generous, RN (Registered Nurse) on 07/22/23 at [id number]  Med List Status: <None>   Medication Order Taking? Sig Documenting Provider Last Dose Status Informant  acetaminophen  (TYLENOL ) 325 MG tablet [id number] Yes Take 2 tablets (650 mg total) by mouth every 6 (six) hours as needed for mild pain (or Fever >/= 101). Eveline Hipps, DO Taking Active Nursing Home Medication Administration Guide (MAG)           Med Note Mliss Anderson Jewelene Morton   Tue Jul 15, 2023  6:21 PM)    amLODipine  (NORVASC ) 10 MG tablet [id number] Yes TAKE ONE TABLET BY MOUTH DAILY Maudine Sos, MD Taking Active Nursing Home Medication Administration Guide (MAG)  apixaban  (ELIQUIS ) 5 MG TABS tablet [id number] Yes TAKE ONE TABLET BY MOUTH TWICE DAILY [name], [name], MD Taking Active Nursing Home Medication  Administration Guide (MAG)  atorvastatin  (LIPITOR) 40 MG tablet [id number] Yes TAKE 1 TABLET EACH DAY.  Patient taking differently: Take 40 mg by mouth at bedtime.   [name], [name], MD Taking Active Nursing Home Medication Administration Guide (MAG)  citalopram  (CELEXA ) 20 MG tablet [id number] Yes Take 1 tablet (20 mg total) by mouth daily. Loma Rising, MD Taking Active Nursing Home Medication Administration Guide (MAG)  Cyanocobalamin  (VITAMIN B 12 PO) [id number] Yes Take 500 mcg by mouth daily. [provider] Taking Active Nursing Home Medication Administration Guide (MAG)  Ferrous Gluconate  324 (37.5 Fe) MG TABS [id number] Yes Take 324 mg by mouth every other day. [provider] Taking Active Nursing Home Medication Administration Guide (MAG)           Med Note Mliss Anderson PIRJJOACZ, [place] A   Tue Jul 15, 2023  6:22 PM) Last dose not listed in [facility]  folic acid  (FOLVITE ) 1 MG tablet [id number] Yes Take 1 tablet (1 mg total) by mouth daily. Loma Rising, MD Taking Active Nursing Home Medication Administration Guide (MAG)  furosemide  (LASIX ) 80 MG tablet [id number] No TAKE ONE TABLET BY MOUTH DAILY  Patient not taking: Reported on 07/22/2023   Maudine Sos, MD Not Taking Active Nursing Home Medication Administration Guide (MAG)  LORazepam  (ATIVAN ) 0.5 MG tablet [id number] Yes Take 1 tablet (0.5 mg total) by mouth at bedtime. Loma Rising, MD Taking Active Nursing Home Medication Administration Guide (MAG)  metoprolol  succinate (TOPROL -XL) 25 MG 24 hr tablet [id number] Yes Take 0.5 tablets (12.5 mg total) by mouth daily. Loma Rising, MD Taking Active Nursing Home Medication Administration Guide (MAG)  Multiple Vitamin (MULTIVITAMIN WITH MINERALS) TABS tablet  [id number] Yes Take 1 tablet by mouth daily. [provider] Taking Active Nursing Home Medication Administration Guide (MAG)  ondansetron  (ZOFRAN ) 4 MG tablet [id number] No Take 1 tablet (4 mg total) by mouth  every 8 (eight) hours as needed for nausea or vomiting.  Patient not taking: Reported on 07/22/2023   Loma Rising, MD Not Taking Active Nursing Home Medication Administration Guide (MAG)           Med Note Mliss Anderson Haskel Link A   Tue Jul 15, 2023  6:24 PM) Last dose not listed in [facility]  pantoprazole  (PROTONIX ) 40 MG tablet [id number] Yes TAKE ONE TABLET BY MOUTH ONCE DAILY [name], [name], MD Taking Active Nursing Home Medication Administration Guide (MAG)           Med Note Mliss Anderson Haskel Link A   Tue Jul 15, 2023  6:24 PM)    polyethylene glycol powder (MIRALAX) 17 GM/SCOOP powder [id number]  Take 17 g by mouth 2 (two) times daily as needed for mild constipation. Gonfa, Taye T, MD  Active   thiamine  (VITAMIN B-1) 100 MG tablet [id number] Yes Take 1 tablet (100 mg total) by mouth daily. Loma Rising, MD Taking Active Nursing Home Medication Administration Guide (MAG)  Med List Note Mliss Anderson Gioia Laity 07/15/23 1732): [place] [phone]            Home Care and Equipment/Supplies: Were Home Health Services Ordered?: Yes [facility] Has Agency set up a time to come to your home?: No Any new equipment or medical supplies ordered?: No  Functional Questionnaire: Do you need assistance with bathing/showering or dressing?: Yes Do you need assistance with meal preparation?: Yes Do you need assistance with eating?: No Do you have difficulty maintaining continence: No Do you need assistance with getting out of bed/getting out of a chair/moving?: No Do you have difficulty managing or taking your medications?: No  Follow up appointments reviewed: PCP Follow-up appointment confirmed?: No (Daughter to call today) MD Provider Line Number:[phone] Given: No Specialist Hospital Follow-up appointment confirmed?: Yes Date of Specialist follow-up appointment?: 08/04/23 Follow-Up Specialty Provider:: Dr. Hershell Lose Do you need  transportation to your follow-up appointment?: No Do you understand care options if your condition(s) worsen?: Yes-patient verbalized understanding  SDOH Interventions Today    Flowsheet Row Most Recent Value  SDOH Interventions   Food Insecurity Interventions Intervention Not Indicated  Housing Interventions Intervention Not Indicated  Transportation Interventions Intervention Not Indicated  Utilities Interventions Intervention Not Indicated     Phone call with daughter Murlean Armour ( on [place]).  Daughter is in from [place] to assist her mother with care.  Reviewed all discharge instructions with daughter.    Reviewed when to call MD for complications.  Home health has been ordered but has not arrived yet as patient has been home less than 24 hours.  Reviewed home health contact information if daughter does not hear from the agency in the next 24 hours.  Also provided my contact information for daughter to call me for assistance if needed.   Reviewed and offered 30 day TOC program and daughter declined.   Orpha Blade, RN, BSN, CEN [employer]- Transition of Care Team.  [facility] [phone]

## 2023-07-29 ENCOUNTER — Telehealth: Payer: Self-pay

## 2023-07-29 ENCOUNTER — Ambulatory Visit: Payer: Self-pay | Admitting: Family Medicine

## 2023-07-29 ENCOUNTER — Ambulatory Visit: Admitting: Family Medicine

## 2023-07-29 ENCOUNTER — Encounter: Payer: Self-pay | Admitting: Family Medicine

## 2023-07-29 DIAGNOSIS — K3189 Other diseases of stomach and duodenum: Secondary | ICD-10-CM

## 2023-07-29 DIAGNOSIS — R7309 Other abnormal glucose: Secondary | ICD-10-CM

## 2023-07-29 DIAGNOSIS — I10 Essential (primary) hypertension: Secondary | ICD-10-CM | POA: Diagnosis not present

## 2023-07-29 DIAGNOSIS — E876 Hypokalemia: Secondary | ICD-10-CM

## 2023-07-29 DIAGNOSIS — I4819 Other persistent atrial fibrillation: Secondary | ICD-10-CM

## 2023-07-29 DIAGNOSIS — F411 Generalized anxiety disorder: Secondary | ICD-10-CM

## 2023-07-29 LAB — BASIC METABOLIC PANEL WITH GFR
BUN: 5 mg/dL — ABNORMAL LOW (ref 6–23)
CO2: 25 meq/L (ref 19–32)
Calcium: 8.9 mg/dL (ref 8.4–10.5)
Chloride: 99 meq/L (ref 96–112)
Creatinine, Ser: 0.6 mg/dL (ref 0.40–1.20)
GFR: 80.39 mL/min (ref >60.00)
Glucose, Bld: 105 mg/dL — ABNORMAL HIGH (ref 70–99)
Potassium: 2.9 meq/L — ABNORMAL LOW (ref 3.5–5.1)
Sodium: 136 meq/L (ref 135–145)

## 2023-07-29 LAB — CBC
HCT: 38.7 % (ref 36.0–46.0)
Hemoglobin: 13.1 g/dL (ref 12.0–15.0)
MCHC: 33.8 g/dL (ref 30.0–36.0)
MCV: 104 fl — ABNORMAL HIGH (ref 78.0–100.0)
Platelets: 213 10*3/uL (ref 150.0–400.0)
RBC: 3.72 Mil/uL — ABNORMAL LOW (ref 3.87–5.11)
RDW: 13.8 % (ref 11.5–15.5)
WBC: 9.4 10*3/uL (ref 4.0–10.5)

## 2023-07-29 LAB — HEMOGLOBIN A1C: Hgb A1c MFr Bld: 6 % (ref 4.6–6.5)

## 2023-07-29 LAB — MAGNESIUM: Magnesium: 1 mg/dL — CL (ref 1.5–2.5)

## 2023-07-29 MED ORDER — POTASSIUM CHLORIDE CRYS ER 20 MEQ PO TBCR: EXTENDED_RELEASE_TABLET | ORAL | 0 refills | Status: DC

## 2023-07-29 MED ORDER — MAGNESIUM CHLORIDE 64 MG PO TABS: 1.0000 | ORAL_TABLET | Freq: Two times a day (BID) | ORAL | 2 refills | Status: DC

## 2023-07-29 NOTE — Assessment & Plan Note (Signed)
 S/P para hiatal hernia repair and anterior/posterior gastropexy on 07/17/23. Recovering well. Tolerating puree diet. Already followed with surgeon and has a f/u appt on 08/04/23. Instructed about warning signs.

## 2023-07-29 NOTE — Assessment & Plan Note (Signed)
 She has stopped Mg supplementation. Mg back 1.0. Recommend Mg Chloride 64 mg bid. Mg++ to be repeated in 10 days.

## 2023-07-29 NOTE — Assessment & Plan Note (Signed)
 On Furosemide  80 mg daily. Potassium 2.9. Sent Rx for KLOR 20 meq to take bid x 7 d then daily. K+ to be related in 7-10 days.

## 2023-07-29 NOTE — Assessment & Plan Note (Signed)
 Last visit we tried to decrease dose of Celexa  to 10 mg but did not tolerate changes, so she is back to 20 mg daily. Reporting problem as well-controlled. Continue Celexa  20 mg daily and Lorazepam  0.5 mg daily at bedtime. F/U in 4-5 months, before if needed.

## 2023-07-29 NOTE — Patient Instructions (Addendum)
 A few things to remember from today's visit:  High glucose level - Plan: Hemoglobin A1c  Mesenteroaxial gastric volvulus - Plan: Basic metabolic panel with GFR, CBC  Hypertension, essential, benign - Plan: Basic metabolic panel with GFR, CBC  Hypomagnesemia - Plan: Magnesium  No changes today. Continue monitoring blood pressure. Keep appt with surgeon and cardiologist.  If you need refills for medications you take chronically, please call your pharmacy. Do not use My Chart to request refills or for acute issues that need immediate attention. If you send a my chart message, it may take a few days to be addressed, specially if I am not in the office.  Please be sure medication list is accurate. If a new problem present, please set up appointment sooner than planned today.

## 2023-07-29 NOTE — Assessment & Plan Note (Signed)
 Rhythm controlled today. Continue Metoprolol  succinate 25 mg 1/2 tab daily and Eliquis  5 mg bid. Follows with cardiologist.

## 2023-07-29 NOTE — Telephone Encounter (Signed)
 Received call from [facility] from [place] critical magnesium  of 1.0. PCP alerted.

## 2023-07-29 NOTE — Assessment & Plan Note (Signed)
 BP adequately controlled, similar readings at home. Continue metoprolol  succinate 25 mg 1/2 tabd daily and amlodipine  10 mg daily. Continue low-salt diet. Continue monitoring BP regularly.

## 2023-07-29 NOTE — Telephone Encounter (Signed)
 I sent rx for Mg chloride to take bid and KLOR 20 meq to take bid x 7 d then 1 tab daily. Magnesium  and potassium in 7-10 days. Lab orders placed. Thanks, BJ

## 2023-07-29 NOTE — Progress Notes (Signed)
 ACUTE VISIT Chief Complaint  Patient presents with   Hospitalization Follow-up   HPI: Ms.Autumnrose Adaleena Mooers is a 89 y.o. female with a PMHx significant for HLD, hearing loss, anxiety, HTN, atrial fibrillation on chronic anticoagulation, diastolic dysfunction, and aortic atherosclerosis. She is here with her son-in-law today for hospital f/up.   Pt presented to the ED c/o abdominal pain,nausea,and vomiting.  She was previously hospitalized 5/6 to 5/12 for tremors, thought it was caused by anxiety. She was discharged from this visit to [facility], where she was when symptoms started.  She was hospitalized from 5/20 - 5/26 for gastric volvulus.  TOC call on 07/22/23.  Abdominal imaging showed gastric volvulus.   She underwent surgical intervention without significant postoperative complications.  Diet slowly advanced, doing well overall.   On puree diet, tolerating well. Her JP was removed on 6/30.  In the hospital, she was taken off of spironolactone  and magnesium  supplementation, and had her dose of metoprolol  succinate lowered to 12.5 mg daily.  Rest of medications unchanged.  In general, she says she is feeling much better since hospital discharged. She believes she is close to her baseline.  She is walking with a walker. Denies fatigue since returning home.  She will be starting PT next week (6/9-6/10) and still has a private aid.  States her appetite is good, she is following a stricter diet. She is on a puree diet for the next six weeks.  She had some diarrhea yesterday, but mentions this is not very unusual for her. She was given stool softeners while in the hospitals.  Her son mentions she is having bowel movements shortly after eating. She is having 2 stools per day on average, none today.  Denies blood in her stool, difficulty passing gas, abdominal pain, nausea, vomiting, urinary symptoms, or swallowing difficulty.   Follow up with her general surgeon scheduled for  6/9.  Next appointment with cardiology is 6/17.  CT Abdomen and Pelvis from 07/15/2023 Impression:  1. Abnormal appearance of the stomach with herniation into the lower chest and with imaging features of gastric rotation which can be observed in the setting of gastric volvulus. Based on imaging, organo-axial rotation is favored. There is diffuse symmetric enhancement of the stomach. 2. The previously observed cystic structure in the pelvis is notsignificantly changed.  3. Diverticulosis without evidence of diverticulitis.  Hypertension: Currently on metoprolol  succinate 12.5 mg daily and amlodipine  10 mg daily. Also on furosemide  80 mg daily.  BP readings at home: She has been checking her BP every day and says her readings are 120s-130/70s.  Side effects: none Negative for unusual or severe headache, visual changes, exertional chest pain, dyspnea,  focal weakness, or worsening edema.  Lab Results  Component Value Date   CREATININE 0.39 (L) 07/20/2023   BUN 11 07/20/2023   NA 134 (L) 07/20/2023   K 3.6 07/20/2023   CL 101 07/20/2023   CO2 24 07/20/2023   Anxiety:  Currently on citalopram  20 mg daily and Lorazepam  0.5 mg at bedtime.   Atrial fibrillation:  Currently on Eliquis  5 mg twice daily.   HypoMg++, last Mg 1.6 on 07/20/23.  Review of Systems  Constitutional:  Negative for chills and fever.  HENT:  Negative for mouth sores and sore throat.   Respiratory:  Negative for cough and wheezing.   Gastrointestinal:  Negative for nausea and vomiting.  Endocrine: Negative for cold intolerance and heat intolerance.  Genitourinary:  Negative for decreased urine volume, dysuria and hematuria.  Musculoskeletal:  Positive for gait problem.  Skin:  Negative for rash.  Neurological:  Negative for syncope and facial asymmetry.  Psychiatric/Behavioral:  Negative for confusion and hallucinations.   See other pertinent positives and negatives in HPI.  Current Outpatient Medications on File  Prior to Visit  Medication Sig Dispense Refill   acetaminophen  (TYLENOL ) 325 MG tablet Take 2 tablets (650 mg total) by mouth every 6 (six) hours as needed for mild pain (or Fever >/= 101).     amLODipine  (NORVASC ) 10 MG tablet TAKE ONE TABLET BY MOUTH DAILY 90 tablet 3   apixaban  (ELIQUIS ) 5 MG TABS tablet TAKE ONE TABLET BY MOUTH TWICE DAILY 180 tablet 2   atorvastatin  (LIPITOR) 40 MG tablet TAKE 1 TABLET EACH DAY. (Patient taking differently: Take 40 mg by mouth at bedtime.) 90 tablet 2   citalopram  (CELEXA ) 20 MG tablet Take 1 tablet (20 mg total) by mouth daily. 30 tablet 0   Cyanocobalamin  (VITAMIN B 12 PO) Take 500 mcg by mouth daily.     Ferrous Gluconate  324 (37.5 Fe) MG TABS Take 324 mg by mouth every other day.     folic acid  (FOLVITE ) 1 MG tablet Take 1 tablet (1 mg total) by mouth daily. 30 tablet 3   furosemide  (LASIX ) 80 MG tablet TAKE ONE TABLET BY MOUTH DAILY 90 tablet 3   LORazepam  (ATIVAN ) 0.5 MG tablet Take 1 tablet (0.5 mg total) by mouth at bedtime. 30 tablet 0   metoprolol  succinate (TOPROL -XL) 25 MG 24 hr tablet Take 0.5 tablets (12.5 mg total) by mouth daily.     Multiple Vitamin (MULTIVITAMIN WITH MINERALS) TABS tablet Take 1 tablet by mouth daily.     ondansetron  (ZOFRAN ) 4 MG tablet Take 1 tablet (4 mg total) by mouth every 8 (eight) hours as needed for nausea or vomiting. 20 tablet 0   pantoprazole  (PROTONIX ) 40 MG tablet TAKE ONE TABLET BY MOUTH ONCE DAILY 90 tablet 2   polyethylene glycol powder (MIRALAX ) 17 GM/SCOOP powder Take 17 g by mouth 2 (two) times daily as needed for mild constipation. 255 g 1   thiamine  (VITAMIN B-1) 100 MG tablet Take 1 tablet (100 mg total) by mouth daily. 30 tablet 1   No current facility-administered medications on file prior to visit.    Past Medical History:  Diagnosis Date   Anemia 04/2020   Anxiety    Arthritis    Chicken pox    COVID-19    Heart murmur    Hyperlipidemia    Hypertension    Allergies  Allergen  Reactions   Losartan  Other (See Comments)    diarrhea    Social History   Socioeconomic History   Marital status: Widowed    Spouse name: Not on file   Number of children: 2   Years of education: Not on file   Highest education level: Not on file  Occupational History   Not on file  Tobacco Use   Smoking status: Never   Smokeless tobacco: Never  Vaping Use   Vaping status: Never Used  Substance and Sexual Activity   Alcohol use: Not Currently    Alcohol/week: 7.0 standard drinks of alcohol    Types: 7 Glasses of wine per week    Comment: Occasional   Drug use: No   Sexual activity: Not Currently  Other Topics Concern   Not on file  Social History Narrative   Not on file   Social Drivers of Corporate investment banker  Strain: Low Risk  (08/21/2022)   Overall Financial Resource Strain (CARDIA)    Difficulty of Paying Living Expenses: Not hard at all  Food Insecurity: No Food Insecurity (07/22/2023)   Hunger Vital Sign    Worried About Running Out of Food in the Last Year: Never true    Ran Out of Food in the Last Year: Never true  Transportation Needs: No Transportation Needs (07/22/2023)   PRAPARE - Administrator, Civil Service (Medical): No    Lack of Transportation (Non-Medical): No  Physical Activity: Insufficiently Active (08/21/2022)   Exercise Vital Sign    Days of Exercise per Week: 2 days    Minutes of Exercise per Session: 20 min  Stress: No Stress Concern Present (08/21/2022)   [employer] of Occupational Health - Occupational Stress Questionnaire    Feeling of Stress : Not at all  Social Connections: Moderately Isolated (07/15/2023)   Social Connection and Isolation Panel [NHANES]    Frequency of Communication with Friends and Family: Three times a week    Frequency of Social Gatherings with Friends and Family: Three times a week    Attends Religious Services: More than 4 times per year    Active Member of Clubs or Organizations: No     Attends Banker Meetings: Patient declined    Marital Status: Widowed   Vitals:   07/29/23 1051  BP: 120/80  Pulse: 91  Resp: 16  SpO2: 97%   Body mass index is 29.34 kg/m.  Physical Exam Vitals and nursing note reviewed.  Constitutional:      General: She is not in acute distress.    Appearance: She is well-developed.  HENT:     Head: Normocephalic and atraumatic.     Mouth/Throat:     Mouth: Mucous membranes are moist.     Pharynx: Oropharynx is clear. Uvula midline.  Eyes:     Conjunctiva/sclera: Conjunctivae normal.  Cardiovascular:     Rate and Rhythm: Normal rate and regular rhythm.     Pulses:          Dorsalis pedis pulses are 2+ on the right side and 2+ on the left side.     Heart sounds: Murmur (Soft SEM RUSB) heard.  Pulmonary:     Effort: Pulmonary effort is normal. No respiratory distress.     Breath sounds: Normal breath sounds.  Abdominal:     Palpations: Abdomen is soft. There is no mass.     Tenderness: There is no abdominal tenderness.  Musculoskeletal:     Right lower leg: 1+ Edema present.     Left lower leg: Edema (Trace pitting edema) present.  Lymphadenopathy:     Cervical: No cervical adenopathy.  Skin:    General: Skin is warm.     Findings: No erythema or rash.  Neurological:     General: No focal deficit present.     Mental Status: She is alert and oriented to person, place, and time.     Comments: Gait assisted with a walker.  Psychiatric:        Mood and Affect: Mood and affect normal.   ASSESSMENT AND PLAN:  Ms Pierron was seen today for hospital follow up. Lab Results  Component Value Date   NA 136 07/29/2023   CL 99 07/29/2023   K 2.9 (L) 07/29/2023   CO2 25 07/29/2023   BUN 5 (L) 07/29/2023   CREATININE 0.60 07/29/2023   GFR 80.39 07/29/2023   CALCIUM  8.9  07/29/2023   PHOS 2.6 07/20/2023   ALBUMIN 2.8 (L) 07/20/2023   GLUCOSE 105 (H) 07/29/2023   Lab Results  Component Value Date   WBC 9.4 07/29/2023    HGB 13.1 07/29/2023   HCT 38.7 07/29/2023   MCV 104.0 (H) 07/29/2023   PLT 213.0 07/29/2023   Lab Results  Component Value Date   HGBA1C 6.0 07/29/2023   Hypomagnesemia Assessment & Plan: She has stopped Mg supplementation. Mg back 1.0. Recommend Mg Chloride 64 mg bid. Mg++ to be repeated in 10 days.  Orders: -     Magnesium ; Future -     Magnesium  Chloride; Take 1 tablet by mouth 2 (two) times daily.  Dispense: 60 tablet; Refill: 2 -     Magnesium ; Future  High glucose level Glucose 120's-140's. No hx of diabetes. Further recommendations according to HgA1C result.  -     Hemoglobin A1c; Future  Mesenteroaxial gastric volvulus Assessment & Plan: S/P para hiatal hernia repair and anterior/posterior gastropexy on 07/17/23. Recovering well. Tolerating puree diet. Already followed with surgeon and has a f/u appt on 08/04/23. Instructed about warning signs.  Orders: -     Basic metabolic panel with GFR; Future -     CBC; Future  Hypertension, essential, benign Assessment & Plan: BP adequately controlled, similar readings at home. Continue metoprolol  succinate 25 mg 1/2 tabd daily and amlodipine  10 mg daily. Continue low-salt diet. Continue monitoring BP regularly.  Orders: -     Basic metabolic panel with GFR; Future -     CBC; Future  Hypokalemia Assessment & Plan: On Furosemide  80 mg daily. Potassium 2.9. Sent Rx for KLOR 20 meq to take bid x 7 d then daily. K+ to be related in 7-10 days.  Orders: -     Potassium; Future  Generalized anxiety disorder Assessment & Plan: Last visit we tried to decrease dose of Celexa  to 10 mg but did not tolerate changes, so she is back to 20 mg daily. Reporting problem as well-controlled. Continue Celexa  20 mg daily and Lorazepam  0.5 mg daily at bedtime. F/U in 4-5 months, before if needed.   Persistent atrial fibrillation [facility]) Assessment & Plan: Rhythm controlled today. Continue Metoprolol  succinate 25 mg 1/2 tab  daily and Eliquis  5 mg bid. Follows with cardiologist.  I spent a total of 46 minutes in both face to face and non face to face activities for this visit on the date of this encounter. During this time history was obtained and documented, examination was performed, prior labs/imaging reviewed, and assessment/plan discussed.  Return in about 5 months (around 12/29/2023).  I, Fritz Jewel Wierda, acting as a scribe for Renaud Celli [name], MD., have documented all relevant documentation on the behalf of Cathyann Kilfoyle [name], MD, as directed by  Charday Capetillo [name], MD while in the presence of Seleste Tallman [name], MD.   I, Vy Badley [name], MD, have reviewed all documentation for this visit. The documentation on 07/29/23 for the exam, diagnosis, procedures, and orders are all accurate and complete.   Kennan Detter G. [name], MD  [facility]. [place] office.

## 2023-07-30 ENCOUNTER — Telehealth: Payer: Self-pay

## 2023-07-30 NOTE — Telephone Encounter (Signed)
 I left a message for them to call back for verbal. Okay to give approval.

## 2023-07-30 NOTE — Telephone Encounter (Signed)
 Copied from CRM [id number]. Topic: Clinical - Home Health Verbal Orders >> Jul 30, 2023 10:07 AM Artemio Larry wrote: Caller/Agency: Lunda Salines from [facility]  Callback Number: [phone] Service Requested: Physical Therapy, Occupational Therapy  Frequency: n/a Any new concerns about the patient? No, nurse is being sent out 08/05/23 for assessment

## 2023-08-08 ENCOUNTER — Other Ambulatory Visit

## 2023-08-08 DIAGNOSIS — E876 Hypokalemia: Secondary | ICD-10-CM

## 2023-08-08 NOTE — Addendum Note (Signed)
 Addended by: Doll French on: 08/08/2023 09:23 AM   Modules accepted: Orders

## 2023-08-09 LAB — POTASSIUM

## 2023-08-09 LAB — MAGNESIUM: Magnesium: 1.3 mg/dL — ABNORMAL LOW (ref 1.6–2.3)

## 2023-08-11 LAB — SPECIMEN STATUS REPORT

## 2023-08-11 LAB — POTASSIUM: Potassium: 3.6 mmol/L (ref 3.5–5.2)

## 2023-08-11 NOTE — Progress Notes (Unsigned)
 Cardiology Office Note   Date:  08/12/2023  ID:  Linda Glass, DOB 05/11/1933, MRN [id number] PCP: [name], [name], MD  [facility] Providers Cardiologist:  Sheryle Donning, MD     History of Present Illness Linda Glass is a 89 y.o. female with hx of permanent atrial atrial fibrillation, mild MR, HTN, HLD, anxiety, chronic diastolic heart failure, GERD, obesity, incomplete right bundle branch block.   Admitted 10/02/22 after unwitnessed fall secondary t osuspected syncope. Echo preserved LVEF. Carotid doppler unremarkable. Incidentally found to have E.coli treated with IV abx. Recommended for outpatient ZIO. Seen by Dr. Veryl Gottron 10/11/23 and recommended to follow up in 6 months.   Admitted 5/6-5/12/25 for sudden tremor and unable to ambulate. She was in process of weaning Celexa  as directed by PCP. Per psychiatry, increased Ativan  at bedtime with wine nightly and weaning of Celexa  was likely cause. Toprol  decreased to 12.5mg  daily due to bradycardia. CMP with AST/ALT 63/64 and encouraged to stop drinking (was drinking 3-4 glasses of wine per week).   Admitted 5/20-5/26/25 with abdominal pain and gastric volvulus requiring NG tube. Underwent surgical intervention 5/22 with reduction of paraesophageal hiatal hernia without complication.   ROS: Please see the history of present illness.    All other systems reviewed and are negative.  Studies Reviewed      Cardiac Studies & Procedures   ______________________________________________________________________________________________     ECHOCARDIOGRAM  ECHOCARDIOGRAM COMPLETE 07/16/2023  Narrative ECHOCARDIOGRAM REPORT    Patient Name:   Linda Glass Hemp Date of Exam: 07/16/2023 Medical Rec #:  [id number]                   Height:       60.0 in Accession #:    [id number]                  Weight:       157.0 lb Date of Birth:  06/29/1933                   BSA:          1.684  m Patient Age:    89 years                    BP:           140/68 mmHg Patient Gender: F                           HR:           77 bpm. Exam Location:  Inpatient  Procedure: 2D Echo, Cardiac Doppler and Color Doppler (Both Spectral and Color Flow Doppler were utilized during procedure).  Indications:    Mitral Valve Disorder I05.9  History:        Patient has prior history of Echocardiogram examinations, most recent 10/03/2022. Signs/Symptoms:Murmur; Risk Factors:Hypertension.  Sonographer:    Astrid Blamer Referring Phys: [id number] SHENG L HALEY  IMPRESSIONS   1. Left ventricular ejection fraction, by estimation, is 60 to 65%. The left ventricle has normal function. The left ventricle has no regional wall motion abnormalities. Left ventricular diastolic function could not be evaluated. Elevated left ventricular end-diastolic pressure. 2. Right ventricular systolic function is normal. The right ventricular size is mildly enlarged. There is moderately elevated pulmonary artery systolic pressure. 3. Left atrial size was severely dilated. 4. The mitral valve is normal in structure. No evidence of mitral valve regurgitation. No  evidence of mitral stenosis. There is mild prolapse of posterior leaflet of the mitral valve. 5. The aortic valve is tricuspid. Aortic valve regurgitation is not visualized. No aortic stenosis is present. 6. The inferior vena cava is dilated in size with >50% respiratory variability, suggesting right atrial pressure of 8 mmHg.  FINDINGS Left Ventricle: Left ventricular ejection fraction, by estimation, is 60 to 65%. The left ventricle has normal function. The left ventricle has no regional wall motion abnormalities. The left ventricular internal cavity size was normal in size. There is no left ventricular hypertrophy. Left ventricular diastolic function could not be evaluated due to atrial fibrillation. Left ventricular diastolic function could not be evaluated.  Elevated left ventricular end-diastolic pressure.  Right Ventricle: The right ventricular size is mildly enlarged. No increase in right ventricular wall thickness. Right ventricular systolic function is normal. There is moderately elevated pulmonary artery systolic pressure. The tricuspid regurgitant velocity is 3.11 m/s, and with an assumed right atrial pressure of 8 mmHg, the estimated right ventricular systolic pressure is 46.7 mmHg.  Left Atrium: Left atrial size was severely dilated.  Right Atrium: Right atrial size was normal in size.  Pericardium: There is no evidence of pericardial effusion.  Mitral Valve: The mitral valve is normal in structure. There is mild prolapse of posterior leaflet of the mitral valve. Mild mitral annular calcification. No evidence of mitral valve regurgitation. No evidence of mitral valve stenosis.  Tricuspid Valve: The tricuspid valve is normal in structure. Tricuspid valve regurgitation is mild . No evidence of tricuspid stenosis.  Aortic Valve: The aortic valve is tricuspid. Aortic valve regurgitation is not visualized. No aortic stenosis is present. Aortic valve mean gradient measures 3.0 mmHg. Aortic valve peak gradient measures 6.1 mmHg. Aortic valve area, by VTI measures 2.59 cm.  Pulmonic Valve: The pulmonic valve was normal in structure. Pulmonic valve regurgitation is not visualized. No evidence of pulmonic stenosis.  Aorta: The aortic root is normal in size and structure.  Venous: The inferior vena cava is dilated in size with greater than 50% respiratory variability, suggesting right atrial pressure of 8 mmHg.  IAS/Shunts: No atrial level shunt detected by color flow Doppler.   LEFT VENTRICLE PLAX 2D LVIDd:         5.00 cm   Diastology LVIDs:         3.40 cm   LV e' medial:    6.64 cm/s LV PW:         1.00 cm   LV E/e' medial:  23.0 LV IVS:        1.20 cm   LV e' lateral:   10.20 cm/s LVOT diam:     1.90 cm   LV E/e' lateral: 15.0 LV  SV:         73 LV SV Index:   43 LVOT Area:     2.84 cm   RIGHT VENTRICLE             IVC RV Basal diam:  4.90 cm     IVC diam: 2.20 cm RV Mid diam:    3.60 cm RV S prime:     12.30 cm/s TAPSE (M-mode): 2.1 cm  LEFT ATRIUM              Index        RIGHT ATRIUM           Index LA Vol (A2C):   84.3 ml  50.06 ml/m  RA Area:     27.00  cm LA Vol (A4C):   106.0 ml 62.94 ml/m  RA Volume:   90.90 ml  53.97 ml/m LA Biplane Vol: 96.8 ml  57.48 ml/m AORTIC VALVE AV Area (Vmax):    2.97 cm AV Area (Vmean):   2.79 cm AV Area (VTI):     2.59 cm AV Vmax:           123.00 cm/s AV Vmean:          86.900 cm/s AV VTI:            0.281 m AV Peak Grad:      6.1 mmHg AV Mean Grad:      3.0 mmHg LVOT Vmax:         129.00 cm/s LVOT Vmean:        85.500 cm/s LVOT VTI:          0.257 m LVOT/AV VTI ratio: 0.91  AORTA Ao Root diam: 2.30 cm  MITRAL VALVE                TRICUSPID VALVE MV Area (PHT): 3.44 cm     TR Peak grad:   38.7 mmHg MV E velocity: 153.00 cm/s  TR Vmax:        311.00 cm/s  SHUNTS Systemic VTI:  0.26 m Systemic Diam: 1.90 cm  Maudine Sos MD Electronically signed by Maudine Sos MD Signature Date/Time: 07/16/2023/4:16:31 PM    Final          ______________________________________________________________________________________________      Risk Assessment/Calculations  CHA2DS2-VASc Score = 5   This indicates a 7.2% annual risk of stroke. The patient's score is based upon: CHF History: 1 HTN History: 1 Diabetes History: 0 Stroke History: 0 Vascular Disease History: 0 Age Score: 2 Gender Score: 1            Physical Exam VS:  BP 138/60 (BP Location: Left Arm)   Pulse 82   Ht 5' (1.524 m)   Wt 150 lb 9.6 oz (68.3 kg)   SpO2 98%   BMI 29.41 kg/m    Wt Readings from Last 3 Encounters:  08/12/23 150 lb 9.6 oz (68.3 kg)  07/29/23 150 lb 4 oz (68.2 kg)  07/17/23 157 lb (71.2 kg)    GEN: Well nourished, well developed in no acute  distress NECK: No JVD; No carotid bruits CARDIAC: IRIR, no murmurs, rubs, gallops RESPIRATORY:  Clear to auscultation without rales, wheezing or rhonchi  ABDOMEN: Soft, non-tender, non-distended EXTREMITIES:  Bilateral pedal 2+ edema; No deformity   ASSESSMENT AND PLAN  Permanent atrial fibrillation / hypercoagulable state - Rate controlled today. CHA2DS2-VASc Score = 5 [CHF History: 1, HTN History: 1, Diabetes History: 0, Stroke History: 0, Vascular Disease History: 0, Age Score: 2, Gender Score: 1].  Therefore, the patient's annual risk of stroke is 7.2 %.    Continue Eliquis  5mg  BID, does not meet dose reduction criteria. Denies bleeding complications.  Continue Toprol  12.5 mg daily.  She will monitor heart rate at home (dose reduced due to bradycardia) and we will call in 1 week to check in on heart rate.  If heart rate recently controlled at home could consider increasing to whole tablet.  Anticipate heart rate was lower in the hospital as she was sedentary.  Physical deconditioning - Referred to [facility] PT on hospital discharge 07/21/23 with [facility]. Has not yet received services, will call [phone] to inquire on status. Per discussion with [facility], was asked to wait 2 weeks and then when  re-visited she declined services.   HTN -BP mildly elevated in setting of volume overload.   Reduce amlodipine  from 10 mg to 5 mg due to lower extremity edema.   Increase Lasix  to 80 mg a.m. and 40 mg at lunchtime for 3 days then return to 80 mg daily.   Continue Toprol  12.5 mg daily.  She will monitor heart rate at home (dose reduced due to bradycardia) and we will call in 1 week to check in on heart rate.  If heart rate recently controlled at home could consider increasing to whole tablet.  Anticipate heart rate was lower in the hospital as she was sedentary.  HLD -continue atorvastatin  40 mg daily.  Chronic diastolic heart failure - Volume up with 2+ bilateral pedal edema. Increase lasix  to 80mg  AM  and 40mg  PM x 3 days then return to 80mg  AM. Could consider Spironolactone  vs transition from Lasix  to Torsemide pending response.  Hypokalemia / Hypomagnesia - 08/08/23 K 3.6, mag 1.3. following with PCP.        Dispo: follow up in 1 month  Signed, Clearnce Curia, NP

## 2023-08-12 ENCOUNTER — Other Ambulatory Visit: Payer: Self-pay | Admitting: Family Medicine

## 2023-08-12 ENCOUNTER — Ambulatory Visit (HOSPITAL_BASED_OUTPATIENT_CLINIC_OR_DEPARTMENT_OTHER): Admitting: Family

## 2023-08-12 ENCOUNTER — Encounter (HOSPITAL_BASED_OUTPATIENT_CLINIC_OR_DEPARTMENT_OTHER): Payer: Self-pay

## 2023-08-12 ENCOUNTER — Encounter (HOSPITAL_BASED_OUTPATIENT_CLINIC_OR_DEPARTMENT_OTHER): Payer: Self-pay | Admitting: Family

## 2023-08-12 VITALS — BP 138/60 | HR 82 | Ht 60.0 in | Wt 150.6 lb

## 2023-08-12 DIAGNOSIS — I4821 Permanent atrial fibrillation: Secondary | ICD-10-CM

## 2023-08-12 DIAGNOSIS — I10 Essential (primary) hypertension: Secondary | ICD-10-CM | POA: Diagnosis not present

## 2023-08-12 DIAGNOSIS — D6859 Other primary thrombophilia: Secondary | ICD-10-CM

## 2023-08-12 DIAGNOSIS — E782 Mixed hyperlipidemia: Secondary | ICD-10-CM

## 2023-08-12 DIAGNOSIS — I5032 Chronic diastolic (congestive) heart failure: Secondary | ICD-10-CM | POA: Diagnosis not present

## 2023-08-12 DIAGNOSIS — R5381 Other malaise: Secondary | ICD-10-CM

## 2023-08-12 MED ORDER — MAGNESIUM CHLORIDE 64 MG PO TABS: 1.0000 | ORAL_TABLET | Freq: Three times a day (TID) | ORAL | 2 refills | Status: DC

## 2023-08-12 MED ORDER — AMLODIPINE BESYLATE 5 MG PO TABS: 5.0000 mg | ORAL_TABLET | Freq: Every day | ORAL | 3 refills | Status: AC

## 2023-08-12 NOTE — Addendum Note (Signed)
 Addended by: Sadira Standard S on: 08/12/2023 01:39 PM   Modules accepted: Orders

## 2023-08-12 NOTE — Telephone Encounter (Signed)
 See result note.

## 2023-08-12 NOTE — Patient Instructions (Addendum)
 Medication Instructions:  Your physician has recommended you make the following change in your medication:   Change: amlodipine  5mg  daily   Change: lasix  80mg  in the morning and 40mg  at lunch time for 3 days and then return to 80mg  daily in the morning   Follow-Up: Please follow up in 1 month with Dr. Veryl Gottron, Slater Duncan, NP or Neomi Banks, NP   Other Instructions To prevent or reduce lower extremity swelling: Eat a low salt diet. Salt makes the body hold onto extra fluid which causes swelling. Sit with legs elevated. For example, in the recliner or on an ottoman.  Wear knee-high compression stockings during the daytime. Ones labeled 15-20 mmHg provide good compression.   We will send you a mychart message on Friday to check on swelling and in one week to check on HR

## 2023-08-15 ENCOUNTER — Encounter (HOSPITAL_BASED_OUTPATIENT_CLINIC_OR_DEPARTMENT_OTHER): Payer: Self-pay

## 2023-08-15 ENCOUNTER — Telehealth: Payer: Self-pay | Admitting: Cardiology

## 2023-08-15 NOTE — Telephone Encounter (Signed)
 Okay to give verbal for [facility] PT?

## 2023-08-15 NOTE — Telephone Encounter (Signed)
 BP looks pretty good. Amlodipine  was reduced from 10mg  to 5mg  daily. She may split her 10mg  tablets in half for correct dosing. Rx was sent to [facility], can call to confirm they received updated Rx.  Glad swelling improving. Recommend continue Lasix  80mg  daily, continue to elevate legs when sitting. She may take an extra half tablet of Lasix  in the afternoon as needed once per week for swelling. If she is needing extra Lasix  more than once per week, please let us  know.   Latitia Housewright S Banks Chaikin, NP

## 2023-08-15 NOTE — Telephone Encounter (Signed)
 Update as requested

## 2023-08-15 NOTE — Telephone Encounter (Signed)
 Returned call to [facility]- no answers, left detailed message on confidential line.

## 2023-08-15 NOTE — Telephone Encounter (Signed)
 Requesting home health orders for physical therapy

## 2023-08-19 ENCOUNTER — Encounter (HOSPITAL_BASED_OUTPATIENT_CLINIC_OR_DEPARTMENT_OTHER): Payer: Self-pay

## 2023-08-19 DIAGNOSIS — I10 Essential (primary) hypertension: Secondary | ICD-10-CM

## 2023-08-19 DIAGNOSIS — R000 Tachycardia, unspecified: Secondary | ICD-10-CM

## 2023-08-19 NOTE — Telephone Encounter (Signed)
 Fyi update as requested

## 2023-08-20 MED ORDER — METOPROLOL SUCCINATE ER 25 MG PO TB24: 25.0000 mg | ORAL_TABLET | Freq: Every day | ORAL | 3 refills | Status: DC

## 2023-09-09 ENCOUNTER — Ambulatory Visit (HOSPITAL_BASED_OUTPATIENT_CLINIC_OR_DEPARTMENT_OTHER): Admitting: Family

## 2023-09-09 ENCOUNTER — Encounter (HOSPITAL_BASED_OUTPATIENT_CLINIC_OR_DEPARTMENT_OTHER): Payer: Self-pay | Admitting: Family

## 2023-09-09 VITALS — BP 120/70 | HR 76 | Ht 60.0 in | Wt 150.0 lb

## 2023-09-09 DIAGNOSIS — I4821 Permanent atrial fibrillation: Secondary | ICD-10-CM

## 2023-09-09 DIAGNOSIS — Z5181 Encounter for therapeutic drug level monitoring: Secondary | ICD-10-CM

## 2023-09-09 DIAGNOSIS — D6859 Other primary thrombophilia: Secondary | ICD-10-CM | POA: Diagnosis not present

## 2023-09-09 DIAGNOSIS — I10 Essential (primary) hypertension: Secondary | ICD-10-CM | POA: Diagnosis not present

## 2023-09-09 NOTE — Patient Instructions (Signed)
 Medication Instructions:  Continue Lasix  80mg  daily with additional half tablet as needed for swelling. If needing extra half tablet more than twice per week, please call and let us  know!  *If you need a refill on your cardiac medications before your next appointment, please call your pharmacy*  Lab Work: Your physician recommends that you return for lab work early August for BMET, magnesium  If you have labs (blood work) drawn today and your tests are completely normal, you will receive your results only by: MyChart Message (if you have MyChart) OR A paper copy in the mail If you have any lab test that is abnormal or we need to change your treatment, we will call you to review the results.  Follow-Up: At [facility], you and your health needs are our priority.  As part of our continuing mission to provide you with exceptional heart care, our providers are all part of one team.  This team includes your primary Cardiologist (physician) and Advanced Practice Providers or APPs (Physician Assistants and Nurse Practitioners) who all work together to provide you with the care you need, when you need it.  Your next appointment:   6 month(s)  Provider:   Annabella Scarce, MD, Rosaline Bane, NP, or Reche Finder, NP    We recommend signing up for the patient portal called MyChart.  Sign up information is provided on this After Visit Summary.  MyChart is used to connect with patients for Virtual Visits (Telemedicine).  Patients are able to view lab/test results, encounter notes, upcoming appointments, etc.  Non-urgent messages can be sent to your provider as well.   To learn more about what you can do with MyChart, go to [email or web address].   Other Instructions  To prevent or reduce lower extremity swelling: Eat a low salt diet. Salt makes the body hold onto extra fluid which causes swelling. Sit with legs elevated. For example, in the recliner or on an ottoman.  Wear knee-high  compression stockings during the daytime. Ones labeled 15-20 mmHg provide good compression.

## 2023-09-09 NOTE — Progress Notes (Signed)
 Cardiology Office Note   Date:  09/09/2023  ID:  Alvaro Noelia Carol, DOB [birth year], MRN [id number] PCP: [name], [name], MD  [facility] Providers Cardiologist:  Shelda Bruckner, MD     History of Present Illness Linda Glass is a 89 y.o. female with hx of permanent atrial atrial fibrillation, mild MR, HTN, HLD, anxiety, chronic diastolic heart failure, GERD, obesity, incomplete right bundle branch block.   Admitted 10/02/22 after unwitnessed fall secondary t osuspected syncope. Echo preserved LVEF. Carotid doppler unremarkable. Incidentally found to have E.coli treated with IV abx. Recommended for outpatient ZIO. Seen by Dr. Bruckner 10/11/23 and recommended to follow up in 6 months.   Admitted 5/6-5/12/25 for sudden tremor and unable to ambulate. She was in process of weaning Celexa  as directed by PCP. Per psychiatry, increased Ativan  at bedtime with wine nightly and weaning of Celexa  was likely cause. Toprol  decreased to 12.5mg  daily due to bradycardia. CMP with AST/ALT 63/64 and encouraged to stop drinking (was drinking 3-4 glasses of wine per week).   Admitted 5/20-5/26/25 with abdominal pain and gastric volvulus requiring NG tube. Underwent surgical intervention 5/22 with reduction of paraesophageal hiatal hernia without complication.   Last seen 08/12/23. She was having difficulty splittin gToprol in half, as HR routinely >60bpm at home, increased to 25mg  daily. Amlodipine  reduced from 10mg  to 5mg  due to LE edema and Lasix  increased for 3 days.   Presents today for follow up with her daughter. Since last seen has made dietary changes to reduce sodium intake which has significantly helped with swelling. She is working with [facility] PT and notes improvement in her stamina, activity tolerance. Continues to take Lasix  in the morning but rarely taking afternoon dose.   ROS: Please see the history of present illness.    All other systems reviewed and are  negative.  Studies Reviewed      Cardiac Studies & Procedures   ______________________________________________________________________________________________     ECHOCARDIOGRAM  ECHOCARDIOGRAM COMPLETE 07/16/2023  Narrative ECHOCARDIOGRAM REPORT    Patient Name:   Linda Glass Date of Exam: 07/16/2023 Medical Rec #:  [id number]                   Height:       60.0 in Accession #:    [id number]                  Weight:       157.0 lb Date of Birth:  09/23/1933                   BSA:          1.684 m Patient Age:    89 years                    BP:           140/68 mmHg Patient Gender: F                           HR:           77 bpm. Exam Location:  Inpatient  Procedure: 2D Echo, Cardiac Doppler and Color Doppler (Both Spectral and Color Flow Doppler were utilized during procedure).  Indications:    Mitral Valve Disorder I05.9  History:        Patient has prior history of Echocardiogram examinations, most recent 10/03/2022. Signs/Symptoms:Murmur; Risk Factors:Hypertension.  Sonographer:    Jayson Gaskins Referring  Phys: [id number] SHENG L HALEY  IMPRESSIONS   1. Left ventricular ejection fraction, by estimation, is 60 to 65%. The left ventricle has normal function. The left ventricle has no regional wall motion abnormalities. Left ventricular diastolic function could not be evaluated. Elevated left ventricular end-diastolic pressure. 2. Right ventricular systolic function is normal. The right ventricular size is mildly enlarged. There is moderately elevated pulmonary artery systolic pressure. 3. Left atrial size was severely dilated. 4. The mitral valve is normal in structure. No evidence of mitral valve regurgitation. No evidence of mitral stenosis. There is mild prolapse of posterior leaflet of the mitral valve. 5. The aortic valve is tricuspid. Aortic valve regurgitation is not visualized. No aortic stenosis is present. 6. The inferior vena cava is dilated in  size with >50% respiratory variability, suggesting right atrial pressure of 8 mmHg.  FINDINGS Left Ventricle: Left ventricular ejection fraction, by estimation, is 60 to 65%. The left ventricle has normal function. The left ventricle has no regional wall motion abnormalities. The left ventricular internal cavity size was normal in size. There is no left ventricular hypertrophy. Left ventricular diastolic function could not be evaluated due to atrial fibrillation. Left ventricular diastolic function could not be evaluated. Elevated left ventricular end-diastolic pressure.  Right Ventricle: The right ventricular size is mildly enlarged. No increase in right ventricular wall thickness. Right ventricular systolic function is normal. There is moderately elevated pulmonary artery systolic pressure. The tricuspid regurgitant velocity is 3.11 m/s, and with an assumed right atrial pressure of 8 mmHg, the estimated right ventricular systolic pressure is 46.7 mmHg.  Left Atrium: Left atrial size was severely dilated.  Right Atrium: Right atrial size was normal in size.  Pericardium: There is no evidence of pericardial effusion.  Mitral Valve: The mitral valve is normal in structure. There is mild prolapse of posterior leaflet of the mitral valve. Mild mitral annular calcification. No evidence of mitral valve regurgitation. No evidence of mitral valve stenosis.  Tricuspid Valve: The tricuspid valve is normal in structure. Tricuspid valve regurgitation is mild . No evidence of tricuspid stenosis.  Aortic Valve: The aortic valve is tricuspid. Aortic valve regurgitation is not visualized. No aortic stenosis is present. Aortic valve mean gradient measures 3.0 mmHg. Aortic valve peak gradient measures 6.1 mmHg. Aortic valve area, by VTI measures 2.59 cm.  Pulmonic Valve: The pulmonic valve was normal in structure. Pulmonic valve regurgitation is not visualized. No evidence of pulmonic stenosis.  Aorta: The  aortic root is normal in size and structure.  Venous: The inferior vena cava is dilated in size with greater than 50% respiratory variability, suggesting right atrial pressure of 8 mmHg.  IAS/Shunts: No atrial level shunt detected by color flow Doppler.   LEFT VENTRICLE PLAX 2D LVIDd:         5.00 cm   Diastology LVIDs:         3.40 cm   LV e' medial:    6.64 cm/s LV PW:         1.00 cm   LV E/e' medial:  23.0 LV IVS:        1.20 cm   LV e' lateral:   10.20 cm/s LVOT diam:     1.90 cm   LV E/e' lateral: 15.0 LV SV:         73 LV SV Index:   43 LVOT Area:     2.84 cm   RIGHT VENTRICLE  IVC RV Basal diam:  4.90 cm     IVC diam: 2.20 cm RV Mid diam:    3.60 cm RV S prime:     12.30 cm/s TAPSE (M-mode): 2.1 cm  LEFT ATRIUM              Index        RIGHT ATRIUM           Index LA Vol (A2C):   84.3 ml  50.06 ml/m  RA Area:     27.00 cm LA Vol (A4C):   106.0 ml 62.94 ml/m  RA Volume:   90.90 ml  53.97 ml/m LA Biplane Vol: 96.8 ml  57.48 ml/m AORTIC VALVE AV Area (Vmax):    2.97 cm AV Area (Vmean):   2.79 cm AV Area (VTI):     2.59 cm AV Vmax:           123.00 cm/s AV Vmean:          86.900 cm/s AV VTI:            0.281 m AV Peak Grad:      6.1 mmHg AV Mean Grad:      3.0 mmHg LVOT Vmax:         129.00 cm/s LVOT Vmean:        85.500 cm/s LVOT VTI:          0.257 m LVOT/AV VTI ratio: 0.91  AORTA Ao Root diam: 2.30 cm  MITRAL VALVE                TRICUSPID VALVE MV Area (PHT): 3.44 cm     TR Peak grad:   38.7 mmHg MV E velocity: 153.00 cm/s  TR Vmax:        311.00 cm/s  SHUNTS Systemic VTI:  0.26 m Systemic Diam: 1.90 cm  Annabella Scarce MD Electronically signed by Annabella Scarce MD Signature Date/Time: 07/16/2023/4:16:31 PM    Final          ______________________________________________________________________________________________      Risk Assessment/Calculations  CHA2DS2-VASc Score = 5   This indicates a 7.2% annual risk of  stroke. The patient's score is based upon: CHF History: 1 HTN History: 1 Diabetes History: 0 Stroke History: 0 Vascular Disease History: 0 Age Score: 2 Gender Score: 1            Physical Exam VS:  BP 120/70 (BP Location: Left Arm, Patient Position: Sitting, Cuff Size: Normal)   Pulse 76   Ht 5' (1.524 m)   Wt 150 lb (68 kg)   BMI 29.29 kg/m    Wt Readings from Last 3 Encounters:  09/09/23 150 lb (68 kg)  08/12/23 150 lb 9.6 oz (68.3 kg)  07/29/23 150 lb 4 oz (68.2 kg)    GEN: Well nourished, well developed in no acute distress NECK: No JVD; No carotid bruits CARDIAC: IRIR, no murmurs, rubs, gallops RESPIRATORY:  Clear to auscultation without rales, wheezing or rhonchi  ABDOMEN: Soft, non-tender, non-distended EXTREMITIES:  Bilateral pedal 2+ edema; No deformity   ASSESSMENT AND PLAN  Permanent atrial fibrillation / hypercoagulable state - Rate controlled today. CHA2DS2-VASc Score = 5 [CHF History: 1, HTN History: 1, Diabetes History: 0, Stroke History: 0, Vascular Disease History: 0, Age Score: 2, Gender Score: 1].  Therefore, the patient's annual risk of stroke is 7.2 %.    Continue Eliquis  5mg  BID, does not meet dose reduction criteria. Denies bleeding complications. Continue Toprol  25 mg daily, has not  had recurrent bradycardia  Physical deconditioning -continue.  HTN - BP well controlled. Continue current antihypertensive regimen.    HLD -continue atorvastatin  40 mg daily.  Chronic diastolic heart failure -weight stable from previous however marked improvement in her lower extremity edema.  Continue Lasix  80 mg a.m. with additional 40 mg in the afternoon only as needed. Low sodium diet, fluid restriction <2L, and daily weights encouraged. Educated to contact our office for weight gain of 2 lbs overnight or 5 lbs in one week.   Hypokalemia / Hypomagnesia - 08/08/23 K 3.6, mag 1.3. Update BMET, mag.       Dispo: follow up in 6 months  Signed, Reche GORMAN Finder, NP

## 2023-09-11 ENCOUNTER — Telehealth: Payer: Self-pay | Admitting: Family

## 2023-09-11 ENCOUNTER — Encounter (HOSPITAL_BASED_OUTPATIENT_CLINIC_OR_DEPARTMENT_OTHER): Payer: Self-pay

## 2023-09-11 ENCOUNTER — Telehealth: Payer: Self-pay | Admitting: Student in an Organized Health Care Education/Training Program

## 2023-09-11 ENCOUNTER — Other Ambulatory Visit (HOSPITAL_BASED_OUTPATIENT_CLINIC_OR_DEPARTMENT_OTHER): Payer: Self-pay | Admitting: *Deleted

## 2023-09-11 ENCOUNTER — Telehealth (HOSPITAL_BASED_OUTPATIENT_CLINIC_OR_DEPARTMENT_OTHER): Payer: Self-pay | Admitting: *Deleted

## 2023-09-11 ENCOUNTER — Ambulatory Visit (HOSPITAL_BASED_OUTPATIENT_CLINIC_OR_DEPARTMENT_OTHER): Payer: Self-pay | Admitting: Family

## 2023-09-11 DIAGNOSIS — E876 Hypokalemia: Secondary | ICD-10-CM

## 2023-09-11 LAB — BASIC METABOLIC PANEL WITH GFR
BUN/Creatinine Ratio: 9 — ABNORMAL LOW (ref 12–28)
BUN: 5 mg/dL — ABNORMAL LOW (ref 8–27)
CO2: 17 mmol/L — ABNORMAL LOW (ref 20–29)
Calcium: 7.6 mg/dL — ABNORMAL LOW (ref 8.7–10.3)
Chloride: 94 mmol/L — ABNORMAL LOW (ref 96–106)
Creatinine, Ser: 0.57 mg/dL (ref 0.57–1.00)
Glucose: 129 mg/dL — ABNORMAL HIGH (ref 70–99)
Potassium: 2.8 mmol/L — CL (ref 3.5–5.2)
Sodium: 133 mmol/L — ABNORMAL LOW (ref 134–144)
eGFR: 87 mL/min/1.73 (ref >59)

## 2023-09-11 LAB — MAGNESIUM: Magnesium: 1 mg/dL — ABNORMAL LOW (ref 1.6–2.3)

## 2023-09-11 MED ORDER — FUROSEMIDE 80 MG PO TABS: 80.0000 mg | ORAL_TABLET | Freq: Every day | ORAL | Status: DC

## 2023-09-11 MED ORDER — MAGNESIUM GLYCINATE 100 MG PO CAPS: 200.0000 mg | ORAL_CAPSULE | Freq: Every day | ORAL | Status: DC

## 2023-09-11 MED ORDER — POTASSIUM CHLORIDE CRYS ER 20 MEQ PO TBCR: 40.0000 meq | EXTENDED_RELEASE_TABLET | Freq: Every day | ORAL | 3 refills | Status: AC

## 2023-09-11 NOTE — Telephone Encounter (Signed)
 Notified by [facility] of a critical potassium of 2.8.  I messaged the ordering provider of this result.

## 2023-09-11 NOTE — Telephone Encounter (Signed)
 Please read comments in lab result notes.

## 2023-09-11 NOTE — Telephone Encounter (Signed)
 Pt c/o medication issue:  1. Name of Medication:   potassium chloride  SA (KLOR-CON  M) 20 MEQ tablet    2. How are you currently taking this medication (dosage and times per day)?   3. Are you having a reaction (difficulty breathing--STAT)?   4. What is your medication issue? This was under lab results  Recommend potassium 40mEq BID x 2 days then daily.  Daughter has questions about dosage instructions

## 2023-09-11 NOTE — Telephone Encounter (Signed)
 See lab results note.

## 2023-09-11 NOTE — Telephone Encounter (Signed)
 S/w daughter per (DPR) to clarify all instructions that were mentioned previously.

## 2023-09-13 ENCOUNTER — Encounter (HOSPITAL_BASED_OUTPATIENT_CLINIC_OR_DEPARTMENT_OTHER): Payer: Self-pay | Admitting: Family

## 2023-09-15 LAB — BASIC METABOLIC PANEL WITH GFR
BUN/Creatinine Ratio: 11 — ABNORMAL LOW (ref 12–28)
BUN: 7 mg/dL — ABNORMAL LOW (ref 8–27)
CO2: 21 mmol/L (ref 20–29)
Calcium: 8.7 mg/dL (ref 8.7–10.3)
Chloride: 97 mmol/L (ref 96–106)
Creatinine, Ser: 0.64 mg/dL (ref 0.57–1.00)
Glucose: 143 mg/dL — ABNORMAL HIGH (ref 70–99)
Potassium: 4.5 mmol/L (ref 3.5–5.2)
Sodium: 134 mmol/L (ref 134–144)
eGFR: 85 mL/min/1.73 (ref >59)

## 2023-09-16 ENCOUNTER — Other Ambulatory Visit (HOSPITAL_BASED_OUTPATIENT_CLINIC_OR_DEPARTMENT_OTHER): Payer: Self-pay | Admitting: *Deleted

## 2023-09-16 ENCOUNTER — Ambulatory Visit (HOSPITAL_BASED_OUTPATIENT_CLINIC_OR_DEPARTMENT_OTHER): Payer: Self-pay | Admitting: Family

## 2023-09-16 ENCOUNTER — Encounter: Admitting: Family Medicine

## 2023-09-16 DIAGNOSIS — I10 Essential (primary) hypertension: Secondary | ICD-10-CM

## 2023-09-16 DIAGNOSIS — R000 Tachycardia, unspecified: Secondary | ICD-10-CM

## 2023-09-16 DIAGNOSIS — I4821 Permanent atrial fibrillation: Secondary | ICD-10-CM

## 2023-09-16 DIAGNOSIS — Z5181 Encounter for therapeutic drug level monitoring: Secondary | ICD-10-CM

## 2023-09-16 LAB — MAGNESIUM: Magnesium: 1.2 mg/dL — ABNORMAL LOW (ref 1.6–2.3)

## 2023-09-16 MED ORDER — MAGNESIUM GLYCINATE 100 MG PO CAPS: 2.0000 | ORAL_CAPSULE | Freq: Two times a day (BID) | ORAL | 11 refills | Status: AC

## 2023-10-04 LAB — MAGNESIUM: Magnesium: 1.1 mg/dL — ABNORMAL LOW (ref 1.6–2.3)

## 2023-10-04 LAB — BASIC METABOLIC PANEL WITH GFR
BUN/Creatinine Ratio: 13 (ref 12–28)
BUN: 8 mg/dL (ref 8–27)
CO2: 17 mmol/L — ABNORMAL LOW (ref 20–29)
Calcium: 8.6 mg/dL — ABNORMAL LOW (ref 8.7–10.3)
Chloride: 105 mmol/L (ref 96–106)
Creatinine, Ser: 0.62 mg/dL (ref 0.57–1.00)
Glucose: 105 mg/dL — ABNORMAL HIGH (ref 70–99)
Potassium: 3.7 mmol/L (ref 3.5–5.2)
Sodium: 140 mmol/L (ref 134–144)
eGFR: 86 mL/min/1.73 (ref >59)

## 2023-10-05 ENCOUNTER — Ambulatory Visit (HOSPITAL_BASED_OUTPATIENT_CLINIC_OR_DEPARTMENT_OTHER): Payer: Self-pay | Admitting: Family

## 2023-10-06 ENCOUNTER — Other Ambulatory Visit (HOSPITAL_BASED_OUTPATIENT_CLINIC_OR_DEPARTMENT_OTHER): Payer: Self-pay | Admitting: *Deleted

## 2023-10-06 MED ORDER — SPIRONOLACTONE 25 MG PO TABS: 25.0000 mg | ORAL_TABLET | Freq: Every day | ORAL | 3 refills | Status: AC

## 2023-10-09 ENCOUNTER — Encounter (HOSPITAL_BASED_OUTPATIENT_CLINIC_OR_DEPARTMENT_OTHER): Payer: Self-pay

## 2023-10-09 ENCOUNTER — Encounter: Admitting: Family Medicine

## 2023-10-11 ENCOUNTER — Other Ambulatory Visit: Payer: Self-pay | Admitting: Family Medicine

## 2023-10-14 ENCOUNTER — Other Ambulatory Visit (HOSPITAL_BASED_OUTPATIENT_CLINIC_OR_DEPARTMENT_OTHER): Payer: Self-pay | Admitting: Family

## 2023-10-14 DIAGNOSIS — E876 Hypokalemia: Secondary | ICD-10-CM

## 2023-10-15 ENCOUNTER — Ambulatory Visit (HOSPITAL_BASED_OUTPATIENT_CLINIC_OR_DEPARTMENT_OTHER): Payer: Self-pay | Admitting: Family

## 2023-10-15 LAB — BASIC METABOLIC PANEL WITH GFR
BUN/Creatinine Ratio: 13 (ref 12–28)
BUN: 9 mg/dL (ref 8–27)
CO2: 19 mmol/L — ABNORMAL LOW (ref 20–29)
Calcium: 9 mg/dL (ref 8.7–10.3)
Chloride: 105 mmol/L (ref 96–106)
Creatinine, Ser: 0.67 mg/dL (ref 0.57–1.00)
Glucose: 80 mg/dL (ref 70–99)
Potassium: 4.4 mmol/L (ref 3.5–5.2)
Sodium: 138 mmol/L (ref 134–144)
eGFR: 84 mL/min/1.73 (ref >59)

## 2023-10-15 LAB — MAGNESIUM: Magnesium: 1.3 mg/dL — ABNORMAL LOW (ref 1.6–2.3)

## 2023-10-30 ENCOUNTER — Other Ambulatory Visit: Payer: Self-pay | Admitting: Family Medicine

## 2023-10-30 DIAGNOSIS — R000 Tachycardia, unspecified: Secondary | ICD-10-CM

## 2023-10-30 DIAGNOSIS — I10 Essential (primary) hypertension: Secondary | ICD-10-CM

## 2023-11-11 ENCOUNTER — Other Ambulatory Visit (HOSPITAL_BASED_OUTPATIENT_CLINIC_OR_DEPARTMENT_OTHER): Payer: Self-pay | Admitting: Cardiovascular Disease

## 2023-11-16 ENCOUNTER — Other Ambulatory Visit: Payer: Self-pay | Admitting: Family Medicine

## 2023-11-16 DIAGNOSIS — F419 Anxiety disorder, unspecified: Secondary | ICD-10-CM

## 2023-11-17 ENCOUNTER — Encounter (HOSPITAL_BASED_OUTPATIENT_CLINIC_OR_DEPARTMENT_OTHER): Payer: Self-pay

## 2023-11-19 ENCOUNTER — Ambulatory Visit (HOSPITAL_BASED_OUTPATIENT_CLINIC_OR_DEPARTMENT_OTHER): Admitting: Orthopaedic Surgery

## 2023-11-19 ENCOUNTER — Encounter (HOSPITAL_BASED_OUTPATIENT_CLINIC_OR_DEPARTMENT_OTHER): Payer: Self-pay

## 2023-11-25 ENCOUNTER — Encounter: Payer: Self-pay | Admitting: Physician Assistant

## 2023-11-25 ENCOUNTER — Ambulatory Visit (INDEPENDENT_AMBULATORY_CARE_PROVIDER_SITE_OTHER): Admitting: Physician Assistant

## 2023-11-25 DIAGNOSIS — B351 Tinea unguium: Secondary | ICD-10-CM | POA: Diagnosis not present

## 2023-11-25 NOTE — Progress Notes (Signed)
 Office Visit Note   Patient: Linda Glass           Date of Birth: 1933-07-12           MRN: [id number] Visit Date: 11/25/2023              Requested by: [name], [name], MD [address] [place],  [place] [address] PCP: [name], [name], MD  Chief Complaint  Patient presents with   Left Foot - Nail Problem   Right Foot - Nail Problem      HPI: 89 y/o female with onychomycosis of the left foot nails.  She is able to get someone to trim the right foot nails, but not the thickened toe nails on the  left foot.    Assessment & Plan: Visit Diagnoses: No diagnosis found.  Plan: F/U in 3 months for nail trim or as needed.   Follow-Up Instructions: Return in about 3 months (around 02/24/2024).   Ortho Exam  Patient is alert, oriented, no adenopathy, well-dressed, normal affect, normal respiratory effort. Onychomycosis of  3 out of the 5 toes nails on the left foot.   Palpable pedal pulses, no cellulitis or edema.     Imaging: No results found. No images are attached to the encounter.  Labs: Lab Results  Component Value Date   HGBA1C 6.0 07/29/2023   HGBA1C 5.9 (H) 02/27/2021   ESRSEDRATE 22 05/11/2020   CRP 7.2 (H) 05/11/2020   CRP 6.9 (H) 05/07/2020   CRP 10.7 (H) 05/06/2020   LABURIC 3.3 05/10/2020   REPTSTATUS 10/04/2022 FINAL 10/02/2022   CULT >=100,000 COLONIES/mL ESCHERICHIA COLI (A) 10/02/2022   LABORGA ESCHERICHIA COLI (A) 10/02/2022     Lab Results  Component Value Date   ALBUMIN 2.8 (L) 07/20/2023   ALBUMIN 3.8 07/15/2023   ALBUMIN 3.4 (L) 07/05/2023   PREALBUMIN 21 07/16/2023    Lab Results  Component Value Date   MG 1.3 (L) 10/14/2023   MG 1.1 (L) 10/03/2023   MG 1.2 (L) 09/15/2023   No results found for: [facility]  Lab Results  Component Value Date   PREALBUMIN 21 07/16/2023      Latest Ref Rng & Units 07/29/2023   11:40 AM 07/20/2023    6:13 AM 07/19/2023    6:45 AM  CBC EXTENDED  WBC 4.0 - 10.5 K/uL 9.4  7.3  9.4    RBC 3.87 - 5.11 Mil/uL 3.72  3.34  3.25   Hemoglobin 12.0 - 15.0 g/dL 86.8  87.8  88.3   HCT 36.0 - 46.0 % 38.7  35.4  34.8   Platelets 150.0 - 400.0 K/uL 213.0  167  177      There is no height or weight on file to calculate BMI.  Orders:  No orders of the defined types were placed in this encounter.  No orders of the defined types were placed in this encounter.    Procedures: No procedures performed  Clinical Data: No additional findings.  ROS:  All other systems negative, except as noted in the HPI. Review of Systems  Objective: Vital Signs: There were no vitals taken for this visit.  Specialty Comments:  No specialty comments available.  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   Incarcerated hiatal hernia 07/15/2023   Uses roller walker 07/15/2023   Chronic atrial fibrillation (HCC) 07/15/2023  History of COVID-19 07/15/2023   Volvulus of stomach 07/15/2023   Anxiety    Hypomagnesemia 07/02/2023   Elevated LFTs 07/02/2023   High alkaline phosphatase 07/02/2023   Tremor 07/01/2023   Chronic diastolic heart failure (HCC) 10/22/2022   Thrombocytopenia 10/22/2022   UTI (urinary tract infection) 10/03/2022   Fall 10/02/2022   Syncope 10/02/2022   Acute UTI (urinary tract infection) 10/02/2022   Hypercoagulable state 07/27/2022   Cerumen impaction 10/11/2021   HOH (hard of hearing) 10/11/2021   Difficulty walking 09/21/2021   Long term current use of anticoagulant therapy 09/21/2021   Osteoarthritis 09/21/2021   AKI (acute kidney injury) 09/17/2021   Diarrhea 09/17/2021   Hyponatremia 09/17/2021   Metabolic acidosis 09/17/2021   Traumatic compression fracture of T8 vertebra (HCC) 09/17/2021   Breast mass, right 09/17/2021   Ovarian cyst 09/17/2021   Atherosclerosis of aorta 01/05/2021   Multiple  gastric ulcers    Persistent atrial fibrillation (HCC) 05/01/2020   Hypokalemia 05/01/2020   Hypertension, essential, benign 07/05/2019   Generalized anxiety disorder 07/05/2019   Insomnia disorder 08/21/2016   Class 1 obesity 08/21/2016   Hyperlipidemia 08/21/2016   Past Medical History:  Diagnosis Date   Anemia 04/2020   Anxiety    Arthritis    Chicken pox    COVID-19    Heart murmur    Hyperlipidemia    Hypertension     Family History  Problem Relation Age of Onset   Hyperlipidemia Mother    Diabetes Neg Hx     Past Surgical History:  Procedure Laterality Date   ABDOMINAL HYSTERECTOMY  1983   BIOPSY  05/05/2020   Procedure: BIOPSY;  Surgeon: Shila Gustav GAILS, MD;  Location: [facility];  Service: Endoscopy;;  EGD and COLON   COLONOSCOPY WITH PROPOFOL  N/A 05/05/2020   Procedure: COLONOSCOPY WITH PROPOFOL ;  Surgeon: Shila Gustav GAILS, MD;  Location: [facility];  Service: Endoscopy;  Laterality: N/A;   ESOPHAGOGASTRODUODENOSCOPY (EGD) WITH PROPOFOL  N/A 05/05/2020   Procedure: ESOPHAGOGASTRODUODENOSCOPY (EGD) WITH PROPOFOL ;  Surgeon: Shila Gustav GAILS, MD;  Location: [facility];  Service: Endoscopy;  Laterality: N/A;   FRACTURE SURGERY     wrist fracture 2013   LAPAROSCOPIC PARTIAL COLECTOMY Right 03/12/2021   Procedure: LAPAROSCOPIC HAND ASSISTED RIGHT COLECTOMY;  Surgeon: Lyndel Deward PARAS, MD;  Location: [facility];  Service: General;  Laterality: Right;   XI ROBOTIC ASSISTED PARAESOPHAGEAL HERNIA REPAIR N/A 07/17/2023   Procedure: REPAIR, HERNIA, PARAESOPHAGEAL, ROBOT-ASSISTED Type II mediastinal dissection. Bilateral crural release with component separation Primary repair of hiatal hernia over pledgets.  Mesh reinforcement with absorbable mesh (BioA) Anterior & posterior gastropexy, fundoplication;  Surgeon: Sheldon Standing, MD;  Location: [facility];  Service: General;  Laterality: N/A;   Social History   Occupational History   Not on file  Tobacco Use   Smoking  status: Never   Smokeless tobacco: Never  Vaping Use   Vaping status: Never Used  Substance and Sexual Activity   Alcohol use: Not Currently    Alcohol/week: 7.0 standard drinks of alcohol    Types: 7 Glasses of wine per week    Comment: Occasional   Drug use: No   Sexual activity: Not Currently

## 2023-12-29 ENCOUNTER — Encounter: Payer: Self-pay | Admitting: Radiology

## 2024-01-08 ENCOUNTER — Ambulatory Visit: Admitting: Family Medicine

## 2024-01-08 DIAGNOSIS — Z0000 Encounter for general adult medical examination without abnormal findings: Secondary | ICD-10-CM | POA: Diagnosis not present

## 2024-01-08 NOTE — Patient Instructions (Addendum)
 I really enjoyed getting to talk with you today! I am available on Tuesdays and Thursdays for virtual visits if you have any questions or concerns, or if I can be of any further assistance.   CHECKLIST FROM ANNUAL WELLNESS VISIT:  -Follow up (please call to schedule if not scheduled after visit):   -yearly for annual wellness visit with primary care office  Here is a list of your preventive care/health maintenance measures and the plan for each if any are due:  PLAN For any measures below that may be due:    1. Can get vaccines at the pharmacy - please let us  know if you do so that we can update your record.  Health Maintenance  Topic Date Due   DTaP/Tdap/Td (1 - Tdap) Never done   Medicare Annual Wellness (AWV)  08/21/2023   Influenza Vaccine  09/26/2023   COVID-19 Vaccine (4 - 2025-26 season) 10/27/2023   Zoster Vaccines- Shingrix (1 of 2) 04/09/2024 (Originally [birth year]/1985)   Pneumococcal Vaccine: 50+ Years  Completed   DEXA SCAN  Completed   Meningococcal B Vaccine  Aged Out    -See a dentist at least yearly  -Get your eyes checked and then per your eye specialist's recommendations  -Other issues addressed today:   -I have included below further information regarding a healthy whole foods based diet, physical activity guidelines for adults, stress management and opportunities for social connections. I hope you find this information useful.   -----------------------------------------------------------------------------------------------------------------------------------------------------------------------------------------------------------------------------------------------------------    NUTRITION: -eat real food: lots of colorful vegetables (half the plate) and fruits -5-7 servings of vegetables and fruits per day (fresh or steamed is best), exp. 2 servings of vegetables with lunch and dinner and 2 servings of fruit per day. Berries and greens such as kale and collards  are great choices.  -consume on a regular basis:  fresh fruits, fresh veggies, fish, nuts, seeds, healthy oils (such as olive oil, avocado oil), whole grains (make sure for bread/pasta/crackers/etc., that the first ingredient on label contains the word whole), legumes. -can eat small amounts of dairy and lean meat (no larger than the palm of your hand), but avoid processed meats such as ham, bacon, lunch meat, etc. -drink water  -try to avoid fast food and pre-packaged foods, processed meat, ultra processed foods/beverages (donuts, candy, etc.) -most experts advise limiting sodium to < 2300mg  per day, should limit further is any chronic conditions such as high blood pressure, heart disease, diabetes, etc. The American Heart Association advised that < 1500mg  is is ideal -try to avoid foods/beverages that contain any ingredients with names you do not recognize  -try to avoid foods/beverages  with added sugar or sweeteners/sweets  -try to avoid sweet drinks (including diet drinks): soda, juice, Gatorade, sweet tea, power drinks, diet drinks -try to avoid white rice, white bread, pasta (unless whole grain)  EXERCISE GUIDELINES FOR ADULTS: -if you wish to increase your physical activity, do so gradually and with the approval of your doctor -STOP and seek medical care immediately if you have any chest pain, chest discomfort or trouble breathing when starting or increasing exercise  -move and stretch your body, legs, feet and arms when sitting for long periods -Physical activity guidelines for optimal health in adults: -get at least 150 minutes per week of moderate exercise (can talk, but not sing); this is about 20-30 minutes of sustained activity 5-7 days per week or two 10-15 minute episodes of sustained activity 5-7 days per week -do some muscle building/resistance training/strength training  at least 2 days per week  -balance exercises 3+ days per week:   Stand somewhere where you have something  sturdy to hold onto if you lose balance    1) lift up on toes, then back down, start with 5x per day and work up to 20x   2) stand and lift one leg straight out to the side so that foot is a few inches of the floor, start with 5x each side and work up to 20x each side   3) stand on one foot, start with 5 seconds each side and work up to 20 seconds on each side  If you need ideas or help with getting more active:  -Silver sneakers https://tools.silversneakers.com  -Walk with a Doc: Http://www.duncan-williams.com/  -try to include resistance (weight lifting/strength building) and balance exercises twice per week: or the following link for ideas: http://castillo-powell.com/  buyducts.dk  STRESS MANAGEMENT: -can try meditating, or just sitting quietly with deep breathing while intentionally relaxing all parts of your body for 5 minutes daily -if you need further help with stress, anxiety or depression please follow up with your primary doctor or contact the wonderful folks at [facility]: [phone]  SOCIAL CONNECTIONS: -options in [place] if you wish to engage in more social and exercise related activities:  -Silver sneakers https://tools.silversneakers.com  -Walk with a Doc: Http://www.duncan-williams.com/  -Check out the [facility] Active Adults 50+ section on the [place] [employer] (hiking clubs, book clubs, cards and games, chess, exercise classes, aquatic classes and much more) - see the website for details: https://www.[place].gov/departments/parks-recreation/active-adults50  -YouTube has lots of exercise videos for different ages and abilities as well  -[facility] (a variety of indoor and outdoor inperson activities for adults). [phone]. [address]  -Virtual Online Classes (a variety of topics): see seniorplanet.org or call  [phone]  -consider volunteering at a school, hospice center, church, senior center or elsewhere

## 2024-01-08 NOTE — Progress Notes (Signed)
 To check-in Nursing staff: Please review Meds, Allergies, PMH, and care teams and update Please request wt, home BP, etc and update vitals if able Please complete the following flowsheets under the Medicare Visits Tab:  Medicare Wellness  Stress  PHQ-2-9  Exercise  Social Connections  Method of visit: Not In Person ----------------------------------------------------------------------------------------------------------------------------------------------------------------------------------------------------------------------  Because this visit was a virtual/telehealth visit, some criteria may be missing or patient reported. Any vitals not documented were not able to be obtained and vitals that have been documented are patient reported.    MEDICARE ANNUAL PREVENTIVE VISIT WITH PROVIDER: (Welcome to Medicare, initial annual wellness or annual wellness exam)  Virtual Visit via Phone Note  I connected with Alvaro Noelia Carol on 01/08/24 by phone and verified that I am speaking with the correct person using two identifiers.  Location patient: home Location provider:work or home office Persons participating in the virtual visit: patient, provider  Concerns and/or follow up today: detailed intake and risks/health assessment completed in flowsheets and below - please see for details. Denies any concerns and reports is doing quite well.   See HM section in Epic for other details of completed HM.    ROS: negative for report of fevers, unintentional weight loss, vision changes, vision loss, hearing loss or change, chest pain, sob, hemoptysis, melena, hematochezia, hematuria, falls, bleeding or bruising, thoughts of suicide or self harm, memory loss  Patient-completed extensive health risk assessment - reviewed and discussed with the patient: See Health Risk Assessment completed with patient prior to the visit either above or in recent phone note. This was reviewed in detailed with  the patient today and appropriate recommendations, orders and referrals were placed as needed per Summary below and patient instructions.   Review of Medical History: -PMH, PSH, Family History and current specialty and care providers reviewed and updated and listed below   Patient Care Team: Jordan, [name], MD as PCP - General (Family Medicine) Lonni Slain, MD as PCP - Cardiology (Cardiology) Liane Sharyne MATSU, [facility] (Inactive) as Pharmacist (Pharmacist) Raford Riggs, MD as Attending Physician (Cardiology) Shila Gustav GAILS, MD as Consulting Physician (Gastroenterology) Sheldon Standing, MD as Consulting Physician (General Surgery)   Past Medical History:  Diagnosis Date   Anemia 04/2020   Anxiety    Arthritis    Chicken pox    COVID-19    Heart murmur    Hyperlipidemia    Hypertension     Past Surgical History:  Procedure Laterality Date   ABDOMINAL HYSTERECTOMY  1983   BIOPSY  05/05/2020   Procedure: BIOPSY;  Surgeon: Shila Gustav GAILS, MD;  Location: [facility];  Service: Endoscopy;;  EGD and COLON   COLONOSCOPY WITH PROPOFOL  N/A 05/05/2020   Procedure: COLONOSCOPY WITH PROPOFOL ;  Surgeon: Shila Gustav GAILS, MD;  Location: [facility];  Service: Endoscopy;  Laterality: N/A;   ESOPHAGOGASTRODUODENOSCOPY (EGD) WITH PROPOFOL  N/A 05/05/2020   Procedure: ESOPHAGOGASTRODUODENOSCOPY (EGD) WITH PROPOFOL ;  Surgeon: Shila Gustav GAILS, MD;  Location: [facility];  Service: Endoscopy;  Laterality: N/A;   FRACTURE SURGERY     wrist fracture 2013   LAPAROSCOPIC PARTIAL COLECTOMY Right 03/12/2021   Procedure: LAPAROSCOPIC HAND ASSISTED RIGHT COLECTOMY;  Surgeon: Lyndel Deward PARAS, MD;  Location: [facility];  Service: General;  Laterality: Right;   XI ROBOTIC ASSISTED PARAESOPHAGEAL HERNIA REPAIR N/A 07/17/2023   Procedure: REPAIR, HERNIA, PARAESOPHAGEAL, ROBOT-ASSISTED Type II mediastinal dissection. Bilateral crural release with component separation Primary repair of  hiatal hernia over pledgets.  Mesh reinforcement with absorbable mesh (BioA) Anterior &  posterior gastropexy, fundoplication;  Surgeon: Sheldon Standing, MD;  Location: [facility];  Service: General;  Laterality: N/A;    Social History   Socioeconomic History   Marital status: Widowed    Spouse name: Not on file   Number of children: 2   Years of education: Not on file   Highest education level: Bachelor's degree (e.g., BA, AB, BS)  Occupational History   Not on file  Tobacco Use   Smoking status: Never   Smokeless tobacco: Never  Vaping Use   Vaping status: Never Used  Substance and Sexual Activity   Alcohol use: Not Currently    Alcohol/week: 7.0 standard drinks of alcohol    Types: 7 Glasses of wine per week    Comment: Occasional   Drug use: No   Sexual activity: Not Currently  Other Topics Concern   Not on file  Social History Narrative   Not on file   Social Drivers of Health   Financial Resource Strain: Low Risk  (01/06/2024)   Overall Financial Resource Strain (CARDIA)    Difficulty of Paying Living Expenses: Not hard at all  Food Insecurity: No Food Insecurity (01/06/2024)   Hunger Vital Sign    Worried About Running Out of Food in the Last Year: Never true    Ran Out of Food in the Last Year: Never true  Transportation Needs: No Transportation Needs (01/06/2024)   PRAPARE - Administrator, Civil Service (Medical): No    Lack of Transportation (Non-Medical): No  Physical Activity: Insufficiently Active (01/06/2024)   Exercise Vital Sign    Days of Exercise per Week: 2 days    Minutes of Exercise per Session: 20 min  Stress: No Stress Concern Present (01/06/2024)   [employer] of Occupational Health - Occupational Stress Questionnaire    Feeling of Stress: Only a little  Social Connections: Moderately Isolated (01/06/2024)   Social Connection and Isolation Panel    Frequency of Communication with Friends and Family: More than three times a week     Frequency of Social Gatherings with Friends and Family: Once a week    Attends Religious Services: 1 to 4 times per year    Active Member of [employer] or Organizations: No    Attends Banker Meetings: Not on file    Marital Status: Widowed  Intimate Partner Violence: Patient Unable To Answer (07/22/2023)   Humiliation, Afraid, Rape, and Kick questionnaire    Fear of Current or Ex-Partner: Patient unable to answer    Emotionally Abused: Patient unable to answer    Physically Abused: Patient unable to answer    Sexually Abused: Patient unable to answer    Family History  Problem Relation Age of Onset   Hyperlipidemia Mother    Diabetes Neg Hx     Current Outpatient Medications on File Prior to Visit  Medication Sig Dispense Refill   amLODipine  (NORVASC ) 5 MG tablet Take 1 tablet (5 mg total) by mouth daily. 90 tablet 3   apixaban  (ELIQUIS ) 5 MG TABS tablet TAKE ONE TABLET BY MOUTH TWICE DAILY 180 tablet 2   atorvastatin  (LIPITOR) 40 MG tablet TAKE 1 TABLET EACH DAY. 90 tablet 2   citalopram  (CELEXA ) 20 MG tablet Take 1 tablet (20 mg total) by mouth daily. 30 tablet 0   Cyanocobalamin  (VITAMIN B 12 PO) Take 500 mcg by mouth daily.     Ferrous Gluconate  324 (37.5 Fe) MG TABS Take 324 mg by mouth every other day.  folic acid  (FOLVITE ) 1 MG tablet Take 1 tablet (1 mg total) by mouth daily. 30 tablet 3   furosemide  (LASIX ) 80 MG tablet TAKE ONE TABLET BY MOUTH DAILY 90 tablet 3   LORazepam  (ATIVAN ) 0.5 MG tablet Take 1 tablet (0.5 mg total) by mouth at bedtime as needed for anxiety. 30 tablet 3   Magnesium  Glycinate ([employer] MAGNESIUM  GLYCINATE) 100 MG CAPS Take 2 capsules by mouth in the morning and at bedtime. 120 capsule 11   metoprolol  succinate (TOPROL -XL) 25 MG 24 hr tablet TAKE ONE TABLET BY MOUTH ONCE DAILY 90 tablet 1   Multiple Vitamin (MULTIVITAMIN WITH MINERALS) TABS tablet Take 1 tablet by mouth daily.     pantoprazole  (PROTONIX ) 40 MG tablet TAKE ONE TABLET BY MOUTH  ONCE DAILY 90 tablet 2   potassium chloride  SA (KLOR-CON  M) 20 MEQ tablet Take 2 tablets (40 mEq total) by mouth daily. 180 tablet 3   thiamine  (VITAMIN B-1) 100 MG tablet Take 1 tablet (100 mg total) by mouth daily. 30 tablet 1   acetaminophen  (TYLENOL ) 325 MG tablet Take 2 tablets (650 mg total) by mouth every 6 (six) hours as needed for mild pain (or Fever >/= 101).     spironolactone  (ALDACTONE ) 25 MG tablet Take 1 tablet (25 mg total) by mouth daily. 90 tablet 3   No current facility-administered medications on file prior to visit.    Allergies  Allergen Reactions   Losartan  Other (See Comments)    diarrhea       Physical Exam Vitals requested from patient and listed below if patient had equipment and was able to obtain at home for this virtual visit: There were no vitals filed for this visit. Estimated body mass index is 29.29 kg/m as calculated from the following:   Height as of 09/09/23: 5' (1.524 m).   Weight as of 09/09/23: 150 lb (68 kg).  EKG (optional): deferred due to virtual visit  GENERAL: alert, oriented, no acute distress detected, full vision exam deferred due to pandemic and/or virtual encounter  PSYCH/NEURO: pleasant and cooperative, no obvious depression or anxiety, speech and thought processing grossly intact, Cognitive function grossly intact  Flowsheet Row Office Visit from 10/22/2022 in [facility]  PHQ-9 Total Score 0        01/08/2024    3:37 PM 10/22/2022   10:29 AM 08/21/2022   10:02 AM 01/25/2022    2:38 PM 10/17/2021   10:48 AM  Depression screen PHQ 2/9  Decreased Interest 0 0 0 0 0  Down, Depressed, Hopeless 0 0 0 0 0  PHQ - 2 Score 0 0 0 0 0  Altered sleeping  0  0 0  Tired, decreased energy  0  0 0  Change in appetite  0  3 0  Feeling bad or failure about yourself   0  0 0  Trouble concentrating  0  0 0  Moving slowly or fidgety/restless  0  0 0  Suicidal thoughts  0  0 0  PHQ-9 Score  0   3  0    Difficult doing work/chores  Not difficult at all        Data saved with a previous flowsheet row definition       08/19/2022    1:54 PM 08/21/2022   10:05 AM 10/22/2022   10:29 AM 01/06/2024    6:13 AM 01/08/2024    3:31 PM  Fall Risk  Falls in the past year?  1 0 1 0   Was there an injury with Fall? 0 0 1 0 0  Was there an injury with Fall? - Comments Followed by medical attention      Fall Risk Category Calculator 1 0 2    Patient at Risk for Falls Due to  No Fall Risks Impaired balance/gait;History of fall(s)  No Fall Risks  Fall risk Follow up  Falls prevention discussed Falls evaluation completed  Falls evaluation completed     SUMMARY AND PLAN:  Encounter for Medicare annual wellness exam  Discussed applicable health maintenance/preventive health measures and advised and referred or ordered per patient preferences: -discussed vaccines due recs and risks and she says she will get at the pharmacy, advised to let us  know if she does so that we can update record. Health Maintenance  Topic Date Due   DTaP/Tdap/Td (1 - Tdap) Never done   Influenza Vaccine  09/26/2023   COVID-19 Vaccine (4 - 2025-26 season) 10/27/2023   Zoster Vaccines- Shingrix (1 of 2) 04/09/2024 (Originally 08/08/1983)   Medicare Annual Wellness (AWV)  01/07/2025   Pneumococcal Vaccine: 50+ Years  Completed   DEXA SCAN  Completed   Meningococcal B Vaccine  Aged Out      Education and counseling on the following was provided based on the above review of health and a plan/checklist for the patient, along with additional information discussed, was provided for the patient in the patient instructions :  -requested copy of advanced directives and counseled -Provided counseling and plan for difficulty hearing - they are scheduling follow up with audiology -Advised and counseled on a healthy lifestyle - including the importance of a healthy diet, regular physical activity, social connections and stress  management. Social connections, diet and stress are good.  -Reviewed patient's current diet. Advised and counseled on a whole foods based healthy diet. A summary of a healthy diet was provided in the Patient Instructions.  -reviewed patient's current physical activity level and discussed exercise guidelines for adults. Discussed community resources and ideas for safe exercise at home to assist in meeting exercise guideline recommendations in a safe and healthy way.  -Advise yearly dental visits at minimum and regular eye exams   Follow up: see patient instructions     Patient Instructions  I really enjoyed getting to talk with you today! I am available on Tuesdays and Thursdays for virtual visits if you have any questions or concerns, or if I can be of any further assistance.   CHECKLIST FROM ANNUAL WELLNESS VISIT:  -Follow up (please call to schedule if not scheduled after visit):   -yearly for annual wellness visit with primary care office  Here is a list of your preventive care/health maintenance measures and the plan for each if any are due:  PLAN For any measures below that may be due:    1. Can get vaccines at the pharmacy - please let us  know if you do so that we can update your record.  Health Maintenance  Topic Date Due   DTaP/Tdap/Td (1 - Tdap) Never done   Medicare Annual Wellness (AWV)  08/21/2023   Influenza Vaccine  09/26/2023   COVID-19 Vaccine (4 - 2025-26 season) 10/27/2023   Zoster Vaccines- Shingrix (1 of 2) 04/09/2024 (Originally 08/08/1983)   Pneumococcal Vaccine: 50+ Years  Completed   DEXA SCAN  Completed   Meningococcal B Vaccine  Aged Out    -See a dentist at least yearly  -Get your eyes checked and then per  your eye specialist's recommendations  -Other issues addressed today:   -I have included below further information regarding a healthy whole foods based diet, physical activity guidelines for adults, stress management and opportunities for social  connections. I hope you find this information useful.   -----------------------------------------------------------------------------------------------------------------------------------------------------------------------------------------------------------------------------------------------------------    NUTRITION: -eat real food: lots of colorful vegetables (half the plate) and fruits -5-7 servings of vegetables and fruits per day (fresh or steamed is best), exp. 2 servings of vegetables with lunch and dinner and 2 servings of fruit per day. Berries and greens such as kale and collards are great choices.  -consume on a regular basis:  fresh fruits, fresh veggies, fish, nuts, seeds, healthy oils (such as olive oil, avocado oil), whole grains (make sure for bread/pasta/crackers/etc., that the first ingredient on label contains the word whole), legumes. -can eat small amounts of dairy and lean meat (no larger than the palm of your hand), but avoid processed meats such as ham, bacon, lunch meat, etc. -drink water  -try to avoid fast food and pre-packaged foods, processed meat, ultra processed foods/beverages (donuts, candy, etc.) -most experts advise limiting sodium to < 2300mg  per day, should limit further is any chronic conditions such as high blood pressure, heart disease, diabetes, etc. The American Heart Association advised that < 1500mg  is is ideal -try to avoid foods/beverages that contain any ingredients with names you do not recognize  -try to avoid foods/beverages  with added sugar or sweeteners/sweets  -try to avoid sweet drinks (including diet drinks): soda, juice, Gatorade, sweet tea, power drinks, diet drinks -try to avoid white rice, white bread, pasta (unless whole grain)  EXERCISE GUIDELINES FOR ADULTS: -if you wish to increase your physical activity, do so gradually and with the approval of your doctor -STOP and seek medical care immediately if you have any chest pain, chest  discomfort or trouble breathing when starting or increasing exercise  -move and stretch your body, legs, feet and arms when sitting for long periods -Physical activity guidelines for optimal health in adults: -get at least 150 minutes per week of moderate exercise (can talk, but not sing); this is about 20-30 minutes of sustained activity 5-7 days per week or two 10-15 minute episodes of sustained activity 5-7 days per week -do some muscle building/resistance training/strength training at least 2 days per week  -balance exercises 3+ days per week:   Stand somewhere where you have something sturdy to hold onto if you lose balance    1) lift up on toes, then back down, start with 5x per day and work up to 20x   2) stand and lift one leg straight out to the side so that foot is a few inches of the floor, start with 5x each side and work up to 20x each side   3) stand on one foot, start with 5 seconds each side and work up to 20 seconds on each side  If you need ideas or help with getting more active:  -Silver sneakers https://tools.silversneakers.com  -Walk with a Doc: Http://www.duncan-williams.com/  -try to include resistance (weight lifting/strength building) and balance exercises twice per week: or the following link for ideas: http://castillo-powell.com/  buyducts.dk  STRESS MANAGEMENT: -can try meditating, or just sitting quietly with deep breathing while intentionally relaxing all parts of your body for 5 minutes daily -if you need further help with stress, anxiety or depression please follow up with your primary doctor or contact the wonderful folks at [facility]: [phone]  SOCIAL CONNECTIONS: -options  in [place] if you wish to engage in more social and exercise related activities:  -Silver sneakers https://tools.silversneakers.com  -Walk with a  Doc: Http://www.duncan-williams.com/  -Check out the [facility] Active Adults 50+ section on the [place] [employer] companies (hiking clubs, book clubs, cards and games, chess, exercise classes, aquatic classes and much more) - see the website for details: https://www.[place].gov/departments/parks-recreation/active-adults50  -YouTube has lots of exercise videos for different ages and abilities as well  -[facility] (a variety of indoor and outdoor inperson activities for adults). [phone])[phone]. [address]  -Virtual Online Classes (a variety of topics): see seniorplanet.org or call [phone]  -consider volunteering at a school, hospice center, church, senior center or elsewhere            Chiquita JONELLE Cramp, DO

## 2024-01-21 ENCOUNTER — Ambulatory Visit (INDEPENDENT_AMBULATORY_CARE_PROVIDER_SITE_OTHER)

## 2024-01-21 DIAGNOSIS — Z23 Encounter for immunization: Secondary | ICD-10-CM | POA: Diagnosis not present

## 2024-02-02 ENCOUNTER — Ambulatory Visit: Admitting: Physician Assistant

## 2024-02-04 ENCOUNTER — Encounter: Payer: Self-pay | Admitting: Cardiovascular Disease

## 2024-02-04 ENCOUNTER — Encounter (HOSPITAL_BASED_OUTPATIENT_CLINIC_OR_DEPARTMENT_OTHER): Payer: Self-pay | Admitting: Cardiovascular Disease

## 2024-02-10 ENCOUNTER — Other Ambulatory Visit: Payer: Self-pay | Admitting: Family Medicine

## 2024-02-10 DIAGNOSIS — F419 Anxiety disorder, unspecified: Secondary | ICD-10-CM

## 2024-02-11 ENCOUNTER — Other Ambulatory Visit: Payer: Self-pay | Admitting: Family Medicine

## 2024-02-11 DIAGNOSIS — E782 Mixed hyperlipidemia: Secondary | ICD-10-CM

## 2024-03-16 ENCOUNTER — Other Ambulatory Visit: Payer: Self-pay | Admitting: Family Medicine

## 2024-03-16 DIAGNOSIS — I4891 Unspecified atrial fibrillation: Secondary | ICD-10-CM

## 2024-04-01 ENCOUNTER — Other Ambulatory Visit: Payer: Self-pay | Admitting: Family Medicine

## 2024-04-01 DIAGNOSIS — F419 Anxiety disorder, unspecified: Secondary | ICD-10-CM

## 2024-04-02 ENCOUNTER — Telehealth: Admitting: Family Medicine

## 2024-04-02 ENCOUNTER — Encounter: Payer: Self-pay | Admitting: Family Medicine

## 2024-04-02 VITALS — BP 130/85 | HR 68 | Ht 60.0 in

## 2024-04-02 DIAGNOSIS — I10 Essential (primary) hypertension: Secondary | ICD-10-CM

## 2024-04-02 DIAGNOSIS — F411 Generalized anxiety disorder: Secondary | ICD-10-CM

## 2024-04-02 DIAGNOSIS — I5032 Chronic diastolic (congestive) heart failure: Secondary | ICD-10-CM

## 2024-04-02 MED ORDER — CITALOPRAM HYDROBROMIDE 20 MG PO TABS: 20.0000 mg | ORAL_TABLET | Freq: Every day | ORAL | 2 refills | Status: AC

## 2024-04-02 NOTE — Assessment & Plan Note (Signed)
 Problem is well-controlled with current regimen and medications have been well-tolerated. Continue Celexa  20 mg daily and lorazepam  0.5 mg daily at bedtime. PDMP reviewed. Lorazepam  refills sent. Follow-up in 6 months, before if needed.

## 2024-04-02 NOTE — Assessment & Plan Note (Signed)
 Echo in 06/2023: LVEF 60 to 65%.Left ventricular diastolic parameters indeterminate. Currently on metoprolol  succinate 25 mg daily and spironolactone  25 mg daily. She has an appointment with cardiologist in 04/2024.

## 2024-04-02 NOTE — Assessment & Plan Note (Signed)
 BP adequately controlled. Currently on amlodipine  5 mg daily, spironolactone  25 mg daily, and metoprolol  succinate 25 mg daily. Continue low-salt diet. Appointment with cardiologist in 04/2024.

## 2024-04-02 NOTE — Addendum Note (Signed)
 Addended by: Clydia Nieves G on: 04/02/2024 05:00 PM   Modules accepted: Orders

## 2024-04-02 NOTE — Telephone Encounter (Signed)
 Can she have a virtual visit today to follow on this medication? Last seen in 07/2023. Thanks, BJ

## 2024-04-02 NOTE — Progress Notes (Addendum)
 Virtual Visit via Video Note I connected with Linda Glass on 04/02/24 by a video enabled telemedicine application and verified that I am speaking with the correct person using two identifiers. Location patient: home Location provider:work office Persons participating in the virtual visit: patient, daughter, and provider  I discussed the limitations of evaluation and management by telemedicine and the availability of in person appointments. The patient expressed understanding and agreed to proceed.  Chief Complaint  Patient presents with   Medical Management of Chronic Issues    Medication refill    HPI: Linda Glass is a 89 y.o. female with PMHx significant for sensorineural hearing loss, atrial atrial fibrillation on chronic anticoagulation, mild MR, HTN, HLD, anxiety, chronic diastolic heart failure, GERD, and incomplete right bundle branch block being seen today for follow up. Her daughter is with her. She was last seen on 07/29/23.  No new health issues since then. Her daughter and son-in-law are living closer to her house, around the corner. She has an aid for 4 hours from Monday to Friday to help with ADLs.  Her daughter takes her shopping in the afternoon and weekends. No falls since her last visit.  Hypertension, atrial fibrillation, diastolic dysfunction: She has an appointment with cardiologist on 05/06/2024. Currently she is on amlodipine  5 mg daily, spironolactone  25 mg daily, and metoprolol  succinate 25 mg daily. On Eliquis  5 mg twice daily and furosemide  80 mg daily.  Echo in 06/2023: LVEF 60 to 65%. She has an appointment with cardiologist in 04/2024. Negative for severe/frequent headache, visual changes, chest pain, dyspnea, palpitation, focal weakness, or edema. Lab Results  Component Value Date   NA 138 10/14/2023   CL 105 10/14/2023   K 4.4 10/14/2023   CO2 19 (L) 10/14/2023   BUN 9 10/14/2023   CREATININE 0.67 10/14/2023   EGFR 84  10/14/2023   CALCIUM  9.0 10/14/2023   PHOS 2.6 07/20/2023   ALBUMIN 2.8 (L) 07/20/2023   GLUCOSE 80 10/14/2023   Anxiety: Currently she is on citalopram  20 mg daily and lorazepam  0.5 mg daily at bedtime, the latter one also helps her sleep. She has tolerated medications well, no side effects reports.  She reports feeling very good, specially after paraesophageal hernia repair in 06/2023.  ROS: See pertinent positives and negatives per HPI.  Past Medical History:  Diagnosis Date   Anemia 04/2020   Anxiety    Arthritis    Chicken pox    COVID-19    Heart murmur    Hyperlipidemia    Hypertension     Past Surgical History:  Procedure Laterality Date   ABDOMINAL HYSTERECTOMY  1983   BIOPSY  05/05/2020   Procedure: BIOPSY;  Surgeon: Shila Gustav GAILS, MD;  Location: [facility];  Service: Endoscopy;;  EGD and COLON   COLONOSCOPY WITH PROPOFOL  N/A 05/05/2020   Procedure: COLONOSCOPY WITH PROPOFOL ;  Surgeon: Shila Gustav GAILS, MD;  Location: [facility];  Service: Endoscopy;  Laterality: N/A;   ESOPHAGOGASTRODUODENOSCOPY (EGD) WITH PROPOFOL  N/A 05/05/2020   Procedure: ESOPHAGOGASTRODUODENOSCOPY (EGD) WITH PROPOFOL ;  Surgeon: Shila Gustav GAILS, MD;  Location: [facility];  Service: Endoscopy;  Laterality: N/A;   FRACTURE SURGERY     wrist fracture 2013   LAPAROSCOPIC PARTIAL COLECTOMY Right 03/12/2021   Procedure: LAPAROSCOPIC HAND ASSISTED RIGHT COLECTOMY;  Surgeon: Lyndel Deward PARAS, MD;  Location: [facility];  Service: General;  Laterality: Right;   XI ROBOTIC ASSISTED PARAESOPHAGEAL HERNIA REPAIR N/A 07/17/2023   Procedure: REPAIR, HERNIA, PARAESOPHAGEAL, ROBOT-ASSISTED Type II  mediastinal dissection. Bilateral crural release with component separation Primary repair of hiatal hernia over pledgets.  Mesh reinforcement with absorbable mesh (BioA) Anterior & posterior gastropexy, fundoplication;  Surgeon: Sheldon Standing, MD;  Location: [facility];  Service: General;  Laterality: N/A;     Family History  Problem Relation Age of Onset   Hyperlipidemia Mother    Diabetes Neg Hx     Social History   Socioeconomic History   Marital status: Widowed    Spouse name: Not on file   Number of children: 2   Years of education: Not on file   Highest education level: Bachelor's degree (e.g., BA, AB, BS)  Occupational History   Not on file  Tobacco Use   Smoking status: Never   Smokeless tobacco: Never  Vaping Use   Vaping status: Never Used  Substance and Sexual Activity   Alcohol use: Not Currently    Alcohol/week: 7.0 standard drinks of alcohol    Types: 7 Glasses of wine per week    Comment: Occasional   Drug use: No   Sexual activity: Not Currently  Other Topics Concern   Not on file  Social History Narrative   Not on file   Social Drivers of Health   Tobacco Use: Low Risk (04/02/2024)   Patient History    Smoking Tobacco Use: Never    Smokeless Tobacco Use: Never    Passive Exposure: Not on file  Financial Resource Strain: Low Risk (01/06/2024)   Overall Financial Resource Strain (CARDIA)    Difficulty of Paying Living Expenses: Not hard at all  Food Insecurity: No Food Insecurity (01/06/2024)   Epic    Worried About Programme Researcher, Broadcasting/film/video in the Last Year: Never true    Ran Out of Food in the Last Year: Never true  Transportation Needs: No Transportation Needs (01/06/2024)   Epic    Lack of Transportation (Medical): No    Lack of Transportation (Non-Medical): No  Physical Activity: Insufficiently Active (01/06/2024)   Exercise Vital Sign    Days of Exercise per Week: 2 days    Minutes of Exercise per Session: 20 min  Stress: No Stress Concern Present (01/06/2024)   [employer] of Occupational Health - Occupational Stress Questionnaire    Feeling of Stress: Only a little  Social Connections: Moderately Isolated (01/06/2024)   Social Connection and Isolation Panel    Frequency of Communication with Friends and Family: More than three times a  week    Frequency of Social Gatherings with Friends and Family: Once a week    Attends Religious Services: 1 to 4 times per year    Active Member of [employer] or Organizations: No    Attends Banker Meetings: Not on file    Marital Status: Widowed  Intimate Partner Violence: Patient Unable To Answer (07/22/2023)   Humiliation, Afraid, Rape, and Kick questionnaire    Fear of Current or Ex-Partner: Patient unable to answer    Emotionally Abused: Patient unable to answer    Physically Abused: Patient unable to answer    Sexually Abused: Patient unable to answer  Depression (PHQ2-9): Low Risk (01/08/2024)   Depression (PHQ2-9)    PHQ-2 Score: 0  Alcohol Screen: Low Risk (01/06/2024)   Alcohol Screen    Last Alcohol Screening Score (AUDIT): 4  Housing: Low Risk (01/06/2024)   Epic    Unable to Pay for Housing in the Last Year: No    Number of Times Moved in the Last Year: 0  Homeless in the Last Year: No  Utilities: Not At Risk (07/22/2023)   [facility] Utilities    Threatened with loss of utilities: No  Health Literacy: Not on file    Current Medications[1]  EXAM:  VITALS per patient if applicable:BP 130/85   Pulse 68   Ht 5' (1.524 m)   BMI 29.29 kg/m   GENERAL: alert, oriented, appears well and in no acute distress  HEENT: atraumatic, conjunctiva clear, no obvious abnormalities on inspection of external nose and ears  NECK: normal movements of the head and neck  LUNGS: on inspection no signs of respiratory distress, breathing rate appears normal, no obvious gross SOB, gasping or wheezing  CV: no obvious cyanosis  MS: moves all visible extremities without noticeable abnormality  PSYCH/NEURO: pleasant and cooperative, no obvious depression or anxiety, speech and thought processing grossly intact  ASSESSMENT AND PLAN:  Discussed the following assessment and plan: Hypertension, essential, benign Assessment & Plan: BP adequately controlled. Currently on  amlodipine  5 mg daily, spironolactone  25 mg daily, and metoprolol  succinate 25 mg daily. Continue low-salt diet. Appointment with cardiologist in 04/2024.   Chronic diastolic heart failure [facility]) Assessment & Plan: Echo in 06/2023: LVEF 60 to 65%.Left ventricular diastolic parameters indeterminate. Currently on metoprolol  succinate 25 mg daily and spironolactone  25 mg daily. She has an appointment with cardiologist in 04/2024.   Generalized anxiety disorder Assessment & Plan: Problem is well-controlled with current regimen and medications have been well-tolerated. Continue Celexa  20 mg daily and lorazepam  0.5 mg daily at bedtime. PDMP reviewed. Lorazepam  refills sent. Follow-up in 6 months, before if needed.  Orders: -     Citalopram  Hydrobromide; Take 1 tablet (20 mg total) by mouth daily.  Dispense: 90 tablet; Refill: 2  We discussed possible serious and likely etiologies, options for evaluation and workup, limitations of telemedicine visit vs in person visit, treatment, treatment risks and precautions. The patient was advised to call back or seek an in-person evaluation if the symptoms worsen or if the condition fails to improve as anticipated. I discussed the assessment and treatment plan with the patient. The patient was provided an opportunity to ask questions and all were answered. The patient agreed with the plan and demonstrated an understanding of the instructions.  Return in about 6 months (around 09/30/2024) for chronic problems.  Alexandr Yaworski, MD      [1]  Current Outpatient Medications:    acetaminophen  (TYLENOL ) 325 MG tablet, Take 2 tablets (650 mg total) by mouth every 6 (six) hours as needed for mild pain (or Fever >/= 101)., Disp: , Rfl:    amLODipine  (NORVASC ) 5 MG tablet, Take 1 tablet (5 mg total) by mouth daily., Disp: 90 tablet, Rfl: 3   atorvastatin  (LIPITOR) 40 MG tablet, TAKE ONE TABLET BY MOUTH DAILY, Disp: 90 tablet, Rfl: 2   Cyanocobalamin  (VITAMIN B 12  PO), Take 500 mcg by mouth daily., Disp: , Rfl:    ELIQUIS  5 MG TABS tablet, TAKE ONE TABLET BY MOUTH TWICE DAILY, Disp: 180 tablet, Rfl: 2   Ferrous Gluconate  324 (37.5 Fe) MG TABS, Take 324 mg by mouth every other day., Disp: , Rfl:    folic acid  (FOLVITE ) 1 MG tablet, Take 1 tablet (1 mg total) by mouth daily., Disp: 30 tablet, Rfl: 3   furosemide  (LASIX ) 80 MG tablet, TAKE ONE TABLET BY MOUTH DAILY, Disp: 90 tablet, Rfl: 3   Magnesium  Glycinate ([employer] MAGNESIUM  GLYCINATE) 100 MG CAPS, Take 2 capsules by mouth in the morning and  at bedtime., Disp: 120 capsule, Rfl: 11   metoprolol  succinate (TOPROL -XL) 25 MG 24 hr tablet, TAKE ONE TABLET BY MOUTH ONCE DAILY, Disp: 90 tablet, Rfl: 1   Multiple Vitamin (MULTIVITAMIN WITH MINERALS) TABS tablet, Take 1 tablet by mouth daily., Disp: , Rfl:    pantoprazole  (PROTONIX ) 40 MG tablet, TAKE ONE TABLET BY MOUTH ONCE DAILY, Disp: 90 tablet, Rfl: 2   potassium chloride  SA (KLOR-CON  M) 20 MEQ tablet, Take 2 tablets (40 mEq total) by mouth daily., Disp: 180 tablet, Rfl: 3   spironolactone  (ALDACTONE ) 25 MG tablet, Take 1 tablet (25 mg total) by mouth daily., Disp: 90 tablet, Rfl: 3   thiamine  (VITAMIN B-1) 100 MG tablet, Take 1 tablet (100 mg total) by mouth daily., Disp: 30 tablet, Rfl: 1   citalopram  (CELEXA ) 20 MG tablet, Take 1 tablet (20 mg total) by mouth daily., Disp: 90 tablet, Rfl: 2   LORazepam  (ATIVAN ) 0.5 MG tablet, TAKE ONE TABLET BY MOUTH AT BEDTIME AS NEEDED FOR ANXIETY, Disp: 30 tablet, Rfl: 3

## 2024-05-06 ENCOUNTER — Ambulatory Visit (HOSPITAL_BASED_OUTPATIENT_CLINIC_OR_DEPARTMENT_OTHER): Admitting: Nurse Practitioner
